# Patient Record
Sex: Male | Born: 1960 | Race: White | Hispanic: No | Smoking: Never smoker
Health system: Southern US, Community
[De-identification: ages and names within clinical notes are randomized; demographics above are authoritative.]

## PROBLEM LIST (undated history)

## (undated) DIAGNOSIS — M549 Dorsalgia, unspecified: Principal | ICD-10-CM

## (undated) DIAGNOSIS — K859 Acute pancreatitis without necrosis or infection, unspecified: Secondary | ICD-10-CM

## (undated) DIAGNOSIS — M1990 Unspecified osteoarthritis, unspecified site: Secondary | ICD-10-CM

## (undated) DIAGNOSIS — I2510 Atherosclerotic heart disease of native coronary artery without angina pectoris: Secondary | ICD-10-CM

## (undated) DIAGNOSIS — T7840XA Allergy, unspecified, initial encounter: Secondary | ICD-10-CM

## (undated) DIAGNOSIS — G931 Anoxic brain damage, not elsewhere classified: Secondary | ICD-10-CM

## (undated) DIAGNOSIS — F329 Major depressive disorder, single episode, unspecified: Secondary | ICD-10-CM

## (undated) DIAGNOSIS — E7800 Pure hypercholesterolemia, unspecified: Secondary | ICD-10-CM

## (undated) DIAGNOSIS — G8929 Other chronic pain: Secondary | ICD-10-CM

## (undated) DIAGNOSIS — I10 Essential (primary) hypertension: Secondary | ICD-10-CM

## (undated) DIAGNOSIS — T8853XA Unintended awareness under general anesthesia during procedure, initial encounter: Secondary | ICD-10-CM

## (undated) DIAGNOSIS — G3184 Mild cognitive impairment, so stated: Secondary | ICD-10-CM

## (undated) DIAGNOSIS — F32A Depression, unspecified: Secondary | ICD-10-CM

## (undated) DIAGNOSIS — Z8719 Personal history of other diseases of the digestive system: Secondary | ICD-10-CM

## (undated) HISTORY — PX: SPINE SURGERY: SHX786

## (undated) HISTORY — DX: Allergy, unspecified, initial encounter: T78.40XA

## (undated) HISTORY — PX: LUMBAR SPINE SURGERY: SHX701

## (undated) HISTORY — PX: TRIGGER POINT INJECTION: SHX2580

## (undated) HISTORY — DX: Major depressive disorder, single episode, unspecified: F32.9

## (undated) HISTORY — PX: BACK SURGERY: SHX140

## (undated) HISTORY — DX: Pure hypercholesterolemia, unspecified: E78.00

## (undated) HISTORY — DX: Other chronic pain: G89.29

## (undated) HISTORY — DX: Personal history of other diseases of the digestive system: Z87.19

## (undated) HISTORY — PX: VASECTOMY: SHX75

## (undated) HISTORY — PX: JOINT REPLACEMENT: SHX530

## (undated) HISTORY — DX: Dorsalgia, unspecified: M54.9

## (undated) HISTORY — PX: CARDIAC CATHETERIZATION: SHX172

## (undated) HISTORY — PX: SHOULDER ARTHROSCOPY WITH ROTATOR CUFF REPAIR AND OPEN BICEPS TENODESIS: SHX6677

## (undated) HISTORY — DX: Mild cognitive impairment of uncertain or unknown etiology: G31.84

## (undated) HISTORY — DX: Atherosclerotic heart disease of native coronary artery without angina pectoris: I25.10

## (undated) HISTORY — DX: Anoxic brain damage, not elsewhere classified: G93.1

## (undated) HISTORY — PX: LAMINECTOMY: SHX219

## (undated) HISTORY — PX: FUNCTIONAL ENDOSCOPIC SINUS SURGERY: SUR616

## (undated) HISTORY — DX: Acute pancreatitis without necrosis or infection, unspecified: K85.90

## (undated) HISTORY — PX: OTHER SURGICAL HISTORY: SHX169

## (undated) HISTORY — DX: Depression, unspecified: F32.A

---

## 2003-07-30 ENCOUNTER — Other Ambulatory Visit: Payer: Self-pay

## 2004-07-08 ENCOUNTER — Ambulatory Visit: Payer: Self-pay | Admitting: Physician Assistant

## 2005-09-30 ENCOUNTER — Emergency Department: Payer: Self-pay | Admitting: Emergency Medicine

## 2005-11-18 ENCOUNTER — Ambulatory Visit: Payer: Self-pay | Admitting: Psychiatry

## 2005-11-18 ENCOUNTER — Other Ambulatory Visit: Payer: Self-pay

## 2006-01-16 ENCOUNTER — Ambulatory Visit: Payer: Self-pay | Admitting: Physician Assistant

## 2006-02-09 ENCOUNTER — Ambulatory Visit: Payer: Self-pay | Admitting: Pain Medicine

## 2006-02-22 ENCOUNTER — Ambulatory Visit: Payer: Self-pay | Admitting: Pain Medicine

## 2006-02-28 ENCOUNTER — Ambulatory Visit: Payer: Self-pay | Admitting: Pain Medicine

## 2006-03-14 ENCOUNTER — Ambulatory Visit: Payer: Self-pay | Admitting: Pain Medicine

## 2006-03-28 ENCOUNTER — Ambulatory Visit: Payer: Self-pay | Admitting: Pain Medicine

## 2006-03-29 ENCOUNTER — Ambulatory Visit: Payer: Self-pay | Admitting: Internal Medicine

## 2006-04-19 ENCOUNTER — Ambulatory Visit: Payer: Self-pay | Admitting: Unknown Physician Specialty

## 2006-05-26 ENCOUNTER — Other Ambulatory Visit: Payer: Self-pay

## 2006-05-26 ENCOUNTER — Ambulatory Visit: Payer: Self-pay | Admitting: Unknown Physician Specialty

## 2006-06-05 ENCOUNTER — Inpatient Hospital Stay: Payer: Self-pay | Admitting: Unknown Physician Specialty

## 2006-11-30 ENCOUNTER — Ambulatory Visit: Payer: Self-pay | Admitting: Gastroenterology

## 2008-07-16 ENCOUNTER — Ambulatory Visit: Payer: Self-pay | Admitting: Unknown Physician Specialty

## 2008-07-22 ENCOUNTER — Inpatient Hospital Stay: Payer: Self-pay | Admitting: Internal Medicine

## 2008-07-31 ENCOUNTER — Ambulatory Visit: Payer: Self-pay | Admitting: Unknown Physician Specialty

## 2008-08-12 ENCOUNTER — Encounter: Payer: Self-pay | Admitting: Gastroenterology

## 2008-08-14 ENCOUNTER — Inpatient Hospital Stay: Payer: Self-pay | Admitting: Specialist

## 2008-08-15 ENCOUNTER — Telehealth (INDEPENDENT_AMBULATORY_CARE_PROVIDER_SITE_OTHER): Payer: Self-pay | Admitting: *Deleted

## 2008-08-15 ENCOUNTER — Encounter: Payer: Self-pay | Admitting: Gastroenterology

## 2008-08-18 ENCOUNTER — Telehealth (INDEPENDENT_AMBULATORY_CARE_PROVIDER_SITE_OTHER): Payer: Self-pay | Admitting: *Deleted

## 2008-09-04 ENCOUNTER — Telehealth (INDEPENDENT_AMBULATORY_CARE_PROVIDER_SITE_OTHER): Payer: Self-pay | Admitting: *Deleted

## 2009-10-19 ENCOUNTER — Ambulatory Visit: Payer: Self-pay | Admitting: Psychology

## 2011-02-01 HISTORY — PX: CHOLECYSTECTOMY: SHX55

## 2011-02-01 HISTORY — PX: VEIN SURGERY: SHX48

## 2011-07-22 DIAGNOSIS — M961 Postlaminectomy syndrome, not elsewhere classified: Secondary | ICD-10-CM | POA: Insufficient documentation

## 2011-07-29 DIAGNOSIS — F320 Major depressive disorder, single episode, mild: Secondary | ICD-10-CM | POA: Insufficient documentation

## 2011-07-29 DIAGNOSIS — F329 Major depressive disorder, single episode, unspecified: Secondary | ICD-10-CM | POA: Insufficient documentation

## 2011-07-29 DIAGNOSIS — F32A Depression, unspecified: Secondary | ICD-10-CM | POA: Insufficient documentation

## 2011-10-27 ENCOUNTER — Inpatient Hospital Stay: Payer: Self-pay | Admitting: Internal Medicine

## 2011-10-27 LAB — COMPREHENSIVE METABOLIC PANEL
Alkaline Phosphatase: 128 U/L (ref 50–136)
Bilirubin,Total: 0.5 mg/dL (ref 0.2–1.0)
Co2: 26 mmol/L (ref 21–32)
EGFR (Non-African Amer.): >60
Glucose: 81 mg/dL (ref 65–99)
SGOT(AST): 20 U/L (ref 15–37)
SGPT (ALT): 18 U/L (ref 12–78)
Sodium: 144 mmol/L (ref 136–145)

## 2011-10-27 LAB — DRUG SCREEN, URINE
Benzodiazepine, Ur Scrn: POSITIVE (ref ?–200)
Cannabinoid 50 Ng, Ur ~~LOC~~: NEGATIVE (ref ?–50)
Cocaine Metabolite,Ur ~~LOC~~: NEGATIVE (ref ?–300)
MDMA (Ecstasy)Ur Screen: NEGATIVE (ref ?–500)
Phencyclidine (PCP) Ur S: NEGATIVE (ref ?–25)
Tricyclic, Ur Screen: NEGATIVE (ref ?–1000)

## 2011-10-27 LAB — URINALYSIS, COMPLETE
Bilirubin,UR: NEGATIVE
Nitrite: NEGATIVE
Protein: NEGATIVE
RBC,UR: 1 /HPF (ref 0–5)
WBC UR: <1 /HPF (ref 0–5)

## 2011-10-27 LAB — PROTIME-INR
INR: 0.9
Prothrombin Time: 12.5 secs (ref 11.5–14.7)

## 2011-10-27 LAB — CBC
HGB: 14.5 g/dL (ref 13.0–18.0)
MCHC: 33.9 g/dL (ref 32.0–36.0)
RDW: 12.9 % (ref 11.5–14.5)
WBC: 8 10*3/uL (ref 3.8–10.6)

## 2011-10-27 LAB — APTT: Activated PTT: 30.3 secs (ref 23.6–35.9)

## 2011-10-27 LAB — TROPONIN I: Troponin-I: <0.02 ng/mL

## 2011-10-27 LAB — MAGNESIUM: Magnesium: 2 mg/dL

## 2011-10-28 LAB — LIPASE, BLOOD: Lipase: 139 U/L (ref 73–393)

## 2011-10-28 LAB — CBC WITH DIFFERENTIAL/PLATELET
Basophil #: 0 10*3/uL (ref 0.0–0.1)
Eosinophil #: 0.1 10*3/uL (ref 0.0–0.7)
HCT: 38.5 % — ABNORMAL LOW (ref 40.0–52.0)
HGB: 13.5 g/dL (ref 13.0–18.0)
Lymphocyte %: 31.9 %
MCHC: 35 g/dL (ref 32.0–36.0)
MCV: 92 fL (ref 80–100)
Monocyte #: 0.6 x10 3/mm (ref 0.2–1.0)
Monocyte %: 8.9 %
Neutrophil #: 4.1 10*3/uL (ref 1.4–6.5)
Neutrophil %: 56.8 %
Platelet: 230 10*3/uL (ref 150–440)
RDW: 13 % (ref 11.5–14.5)

## 2011-10-28 LAB — BASIC METABOLIC PANEL
Calcium, Total: 8.2 mg/dL — ABNORMAL LOW (ref 8.5–10.1)
Co2: 26 mmol/L (ref 21–32)
EGFR (African American): >60
EGFR (Non-African Amer.): >60
Glucose: 82 mg/dL (ref 65–99)
Osmolality: 281 (ref 275–301)
Potassium: 3.9 mmol/L (ref 3.5–5.1)

## 2011-10-28 LAB — MAGNESIUM: Magnesium: 1.8 mg/dL

## 2011-10-30 LAB — LIPID PANEL
Cholesterol: 139 mg/dL (ref 0–200)
VLDL Cholesterol, Calc: 21 mg/dL (ref 5–40)

## 2011-10-31 LAB — LIPASE, BLOOD: Lipase: 339 U/L (ref 73–393)

## 2011-11-03 LAB — COMPREHENSIVE METABOLIC PANEL
Albumin: 3.3 g/dL — ABNORMAL LOW (ref 3.4–5.0)
Alkaline Phosphatase: 96 U/L (ref 50–136)
Anion Gap: 7 (ref 7–16)
BUN: 5 mg/dL — ABNORMAL LOW (ref 7–18)
Calcium, Total: 8.4 mg/dL — ABNORMAL LOW (ref 8.5–10.1)
Creatinine: 1.11 mg/dL (ref 0.60–1.30)
Glucose: 148 mg/dL — ABNORMAL HIGH (ref 65–99)
Osmolality: 285 (ref 275–301)
Potassium: 3.9 mmol/L (ref 3.5–5.1)
Sodium: 143 mmol/L (ref 136–145)
Total Protein: 6.7 g/dL (ref 6.4–8.2)

## 2011-11-04 LAB — PATHOLOGY REPORT

## 2011-11-08 ENCOUNTER — Observation Stay: Payer: Self-pay | Admitting: Surgery

## 2011-11-08 LAB — COMPREHENSIVE METABOLIC PANEL
Anion Gap: 6 — ABNORMAL LOW (ref 7–16)
BUN: 12 mg/dL (ref 7–18)
Bilirubin,Total: 0.5 mg/dL (ref 0.2–1.0)
Calcium, Total: 9.2 mg/dL (ref 8.5–10.1)
Chloride: 106 mmol/L (ref 98–107)
Co2: 28 mmol/L (ref 21–32)
Creatinine: 1.04 mg/dL (ref 0.60–1.30)
EGFR (African American): >60
EGFR (Non-African Amer.): >60
Osmolality: 279 (ref 275–301)
Potassium: 4.4 mmol/L (ref 3.5–5.1)
SGPT (ALT): 56 U/L (ref 12–78)
Sodium: 140 mmol/L (ref 136–145)
Total Protein: 8.2 g/dL (ref 6.4–8.2)

## 2011-11-08 LAB — CBC WITH DIFFERENTIAL/PLATELET
Basophil #: 0 10*3/uL (ref 0.0–0.1)
Basophil %: 0.4 %
Eosinophil %: 0.2 %
HCT: 42.8 % (ref 40.0–52.0)
HGB: 15.1 g/dL (ref 13.0–18.0)
Lymphocyte %: 10.9 %
MCHC: 35.4 g/dL (ref 32.0–36.0)
Monocyte %: 4.9 %
Neutrophil #: 7.5 10*3/uL — ABNORMAL HIGH (ref 1.4–6.5)
Neutrophil %: 83.6 %
Platelet: 271 10*3/uL (ref 150–440)
RBC: 4.75 10*6/uL (ref 4.40–5.90)
WBC: 9 10*3/uL (ref 3.8–10.6)

## 2011-11-09 LAB — COMPREHENSIVE METABOLIC PANEL
Albumin: 3.2 g/dL — ABNORMAL LOW (ref 3.4–5.0)
Alkaline Phosphatase: 166 U/L — ABNORMAL HIGH (ref 50–136)
Anion Gap: 9 (ref 7–16)
BUN: 8 mg/dL (ref 7–18)
Bilirubin,Total: 0.7 mg/dL (ref 0.2–1.0)
Calcium, Total: 8.3 mg/dL — ABNORMAL LOW (ref 8.5–10.1)
Creatinine: 0.85 mg/dL (ref 0.60–1.30)
Glucose: 88 mg/dL (ref 65–99)
Potassium: 3.8 mmol/L (ref 3.5–5.1)
SGOT(AST): 47 U/L — ABNORMAL HIGH (ref 15–37)
SGPT (ALT): 45 U/L (ref 12–78)
Sodium: 142 mmol/L (ref 136–145)
Total Protein: 6.4 g/dL (ref 6.4–8.2)

## 2011-11-09 LAB — CBC WITH DIFFERENTIAL/PLATELET
Basophil #: 0.1 10*3/uL (ref 0.0–0.1)
Basophil %: 1.2 %
Eosinophil #: 0.3 10*3/uL (ref 0.0–0.7)
HCT: 36 % — ABNORMAL LOW (ref 40.0–52.0)
Lymphocyte %: 36.2 %
MCHC: 35.5 g/dL (ref 32.0–36.0)
MCV: 90 fL (ref 80–100)
Monocyte %: 8.4 %
Neutrophil #: 2.7 10*3/uL (ref 1.4–6.5)
Neutrophil %: 49.3 %
Platelet: 207 10*3/uL (ref 150–440)
RDW: 12.2 % (ref 11.5–14.5)
WBC: 5.5 10*3/uL (ref 3.8–10.6)

## 2011-11-10 LAB — COMPREHENSIVE METABOLIC PANEL
Albumin: 3.3 g/dL — ABNORMAL LOW (ref 3.4–5.0)
Alkaline Phosphatase: 156 U/L — ABNORMAL HIGH (ref 50–136)
Anion Gap: 8 (ref 7–16)
Bilirubin,Total: 0.4 mg/dL (ref 0.2–1.0)
Calcium, Total: 8.5 mg/dL (ref 8.5–10.1)
Co2: 27 mmol/L (ref 21–32)
EGFR (African American): >60
EGFR (Non-African Amer.): >60
Glucose: 99 mg/dL (ref 65–99)
Osmolality: 284 (ref 275–301)
SGOT(AST): 26 U/L (ref 15–37)
Sodium: 144 mmol/L (ref 136–145)

## 2011-11-10 LAB — CBC WITH DIFFERENTIAL/PLATELET
Basophil %: 1.4 %
Eosinophil #: 0.4 10*3/uL (ref 0.0–0.7)
HCT: 37.5 % — ABNORMAL LOW (ref 40.0–52.0)
HGB: 13.1 g/dL (ref 13.0–18.0)
Lymphocyte #: 2 10*3/uL (ref 1.0–3.6)
MCH: 31.7 pg (ref 26.0–34.0)
MCHC: 35 g/dL (ref 32.0–36.0)
MCV: 91 fL (ref 80–100)
Monocyte #: 0.5 x10 3/mm (ref 0.2–1.0)
Monocyte %: 8.2 %
Neutrophil #: 3.2 10*3/uL (ref 1.4–6.5)
RBC: 4.14 10*6/uL — ABNORMAL LOW (ref 4.40–5.90)
RDW: 12.9 % (ref 11.5–14.5)

## 2011-11-22 ENCOUNTER — Telehealth: Payer: Self-pay | Admitting: Internal Medicine

## 2011-11-22 NOTE — Telephone Encounter (Signed)
I can see him at 11:15 on 11/20/11.  Work in for this.  If he can bring records from hospitalization (would help).  Also have him bring a list of his current meds.  Thanks.

## 2011-11-22 NOTE — Telephone Encounter (Signed)
Spoke with pt and scheduled that appt.

## 2011-11-22 NOTE — Telephone Encounter (Signed)
Pt is needing to be seen as soon as possible. He was in hospital recently.  He had his Gallbladder removed 2 week ago. And he end back up in the hospital with same symptoms but they are not sure what is going on. He is having bad trouble with severe sweating.  Phone [phone]

## 2011-11-23 ENCOUNTER — Telehealth: Payer: Self-pay | Admitting: Internal Medicine

## 2011-11-23 NOTE — Telephone Encounter (Signed)
Pt is calling back he is very scared. He is blacking out, he gets confused, very dizzy, he is burning up but no fever. He isn't sure what to do or where he should go ??? He says that Doctor's looked for an infection when he was in the hospital but back when he was a [facility] he was brought back and it caused some brain damage. He has no appetite.

## 2011-11-25 NOTE — Telephone Encounter (Signed)
Tried patients cell phone again and number was still busy.

## 2011-11-25 NOTE — Telephone Encounter (Signed)
Tried calling again still busy.

## 2011-11-25 NOTE — Telephone Encounter (Signed)
Cell phone continues to remain busy.

## 2011-11-25 NOTE — Telephone Encounter (Signed)
If dizzy, confused and fever- rec eval now.  To er for eval.

## 2011-11-25 NOTE — Telephone Encounter (Signed)
Tried calling patient cell phone line busy and home line was wrong number

## 2011-11-29 NOTE — Telephone Encounter (Signed)
Patient has appointment tomorrow 11/30/2011, will advice then.

## 2011-11-30 ENCOUNTER — Encounter: Payer: Self-pay | Admitting: Internal Medicine

## 2011-11-30 ENCOUNTER — Ambulatory Visit (INDEPENDENT_AMBULATORY_CARE_PROVIDER_SITE_OTHER): Payer: Medicare Other | Admitting: Internal Medicine

## 2011-11-30 VITALS — BP 142/72 | HR 77 | Temp 98.0°F | Ht 72.0 in | Wt 213.0 lb

## 2011-11-30 DIAGNOSIS — M549 Dorsalgia, unspecified: Secondary | ICD-10-CM

## 2011-11-30 DIAGNOSIS — G8929 Other chronic pain: Secondary | ICD-10-CM

## 2011-11-30 DIAGNOSIS — F3289 Other specified depressive episodes: Secondary | ICD-10-CM

## 2011-11-30 DIAGNOSIS — F32A Depression, unspecified: Secondary | ICD-10-CM

## 2011-11-30 DIAGNOSIS — E7800 Pure hypercholesterolemia, unspecified: Secondary | ICD-10-CM

## 2011-11-30 DIAGNOSIS — F329 Major depressive disorder, single episode, unspecified: Secondary | ICD-10-CM

## 2011-12-01 ENCOUNTER — Encounter: Payer: Self-pay | Admitting: Internal Medicine

## 2011-12-01 DIAGNOSIS — E7800 Pure hypercholesterolemia, unspecified: Secondary | ICD-10-CM | POA: Insufficient documentation

## 2011-12-01 DIAGNOSIS — G8929 Other chronic pain: Secondary | ICD-10-CM | POA: Insufficient documentation

## 2011-12-01 NOTE — Patient Instructions (Signed)
Will make a follow up appt with Dr Evelene Croon.  Also will obtain records for review before drawing more blood.

## 2011-12-01 NOTE — Assessment & Plan Note (Signed)
Has had five back surgeries.  Previously followed by Dr Gerrit Heck.  Is currently being followed at [facility] pain clinic and being referred to Dr Timoteo Ace (neurosurgery).  Not on any pain meds currently.  Will need to continue to follow up at pain clinic regarding his back and chronic pain management.  Is aware that I do not prescribe chronic pain meds.

## 2011-12-01 NOTE — Progress Notes (Signed)
Subjective:    Patient ID: [name], male    DOB: [birth year], 51 y.o.   MRN: [id number]  HPI 51 year old male with past history of chronic back pain s/p five back surgeries and pancreatitis who comes in today for a scheduled follow up.  He was just recently hospitalized for pancreatitis and is now s/p cholecystectomy.  WAs readmitted one week after his surgery with with increased  Bloating and "burning up".  No antibiotics given in the hospital.  He followed up with Dr Michela Pitcher after the last hospitalization.  Dr Michela Pitcher felt everything from a surgical standpoint was doing well.  He continues to have a hot feeling.  Some night sweating.  He is having increased post nasal drainage and congestion.  No chest congestion or sob.  No nausea or vomiting.  Eating better.  Gaining weight.  He is out of his pain meds.  He is being evaluated at the Pain Clinic at [facility].  Is now being referred to Dr Timoteo Ace [phone] - neurosurgery.  He also reports increased stress.  He and his wife separated - 5/13.  He found out she had affairs.  He has sole custody of the kids.  Increased stress and some depression.  Stays in bed a lot.  Not seeing Dr Evelene Croon currently.  No suicidal ideations.    Past Medical History  Diagnosis Date  . Hypercholesterolemia   . Pancreatitis   . Chronic back pain     s/p 5 back surgeries     Review of Systems Patient denies any headache, lightheadedness or dizziness.  No chest pain, tightness or palpatations. No increased shortness of breath, cough or congestion.  No nausea or vomiting.  No signficant abdominal pain or cramping.  No bowel change, such as diarrhea, constipation, BRBPR or melana.  No urine change.        Objective:   Physical Exam Filed Vitals:   11/30/11 1205  BP: 142/72  Pulse: 77  Temp: 98 F (36.7 C)   Blood pressure recheck:  36/48  51 year old male in no acute distress.   HEENT:  Nares - clear except slightly erythematous turbinates.  TMs without erythema.   OP- without lesions or erythema.  Minimal tenderness to palpation over the sinuses.   NECK:  Supple, nontender.  No audible bruit.   HEART:  Appears to be regular. LUNGS:  Without crackles or wheezing audible.  Respirations even and unlabored.   RADIAL PULSE:  Equal bilaterally.  ABDOMEN:  Soft.  No significant tenderness.  Bowel sounds present and normal.  No audible abdominal bruit.   EXTREMITIES:  No increased edema to be present.              SKIN:  No rash.        Assessment & Plan:  INCREASED PSYCHOSOCIAL STRESSORS.  Discussed at length with him today.  Spent over 25 minutes with him - with more than 50% of time discussing his current living situation, etc.  No suicidal ideations.  Discussed the need to get back in with Dr Evelene Croon.  Will arrange an appt.  He is comfortable with this plan.  Hold on adding medication at this time.  He is currently getting his meds through pain clinic.    DIFFICULTY SLEEPING.  Currently receiving his meds through pain clinic.  Will refer back to Dr Evelene Croon.   NIGHT SWEATS.  States he feels hot - intermittent day and night.  Eating better.  Abdominal pain  better.  No urinary symptoms.  Some allergy and sinus symptoms.  Will treat with astelin nasal spray.  Flush with saline.  Gave him a Rx for a Zpak.  If symptoms progress, he can get this filled.  Can see if treatment for this helps with the "hot feeling".  Will also review recent lab results to see if further testing warranted.

## 2011-12-01 NOTE — Assessment & Plan Note (Signed)
Low cholesterol diet and exercise.  Follow lipid panel.   

## 2011-12-08 ENCOUNTER — Telehealth: Payer: Self-pay | Admitting: Internal Medicine

## 2011-12-08 DIAGNOSIS — E7800 Pure hypercholesterolemia, unspecified: Secondary | ICD-10-CM

## 2011-12-08 DIAGNOSIS — R61 Generalized hyperhidrosis: Secondary | ICD-10-CM

## 2011-12-08 NOTE — Telephone Encounter (Signed)
Pt is saying that his mucous and coughing has not getting any better. He says he does not have a fever but is sweating extremely bad. He is not sure what to do or which way to go with what is going on.

## 2011-12-08 NOTE — Telephone Encounter (Signed)
If persistent drainage - then would rec ENT referral.  Regarding the sweatig - i would like to check a few labs.  Have him come in for labs within the next week.  I will place order.

## 2011-12-09 NOTE — Telephone Encounter (Signed)
Left message for patient to return call.

## 2011-12-09 NOTE — Telephone Encounter (Signed)
Patient returned call and has scheduled an appointment to see Dr. Lorin Picket next week.

## 2011-12-13 ENCOUNTER — Ambulatory Visit (INDEPENDENT_AMBULATORY_CARE_PROVIDER_SITE_OTHER): Payer: Medicare Other | Admitting: Internal Medicine

## 2011-12-13 ENCOUNTER — Telehealth: Payer: Self-pay | Admitting: Internal Medicine

## 2011-12-13 ENCOUNTER — Encounter: Payer: Self-pay | Admitting: Internal Medicine

## 2011-12-13 VITALS — BP 123/83 | HR 71 | Temp 98.2°F | Ht 72.0 in | Wt 215.0 lb

## 2011-12-13 DIAGNOSIS — G8929 Other chronic pain: Secondary | ICD-10-CM

## 2011-12-13 DIAGNOSIS — R109 Unspecified abdominal pain: Secondary | ICD-10-CM

## 2011-12-13 DIAGNOSIS — E7800 Pure hypercholesterolemia, unspecified: Secondary | ICD-10-CM

## 2011-12-13 DIAGNOSIS — R61 Generalized hyperhidrosis: Secondary | ICD-10-CM

## 2011-12-13 DIAGNOSIS — M549 Dorsalgia, unspecified: Secondary | ICD-10-CM

## 2011-12-13 LAB — LIPID PANEL
HDL: 40.1 mg/dL (ref >39.00)
Total CHOL/HDL Ratio: 4
Triglycerides: 67 mg/dL (ref 0.0–149.0)

## 2011-12-13 LAB — COMPREHENSIVE METABOLIC PANEL
ALT: 23 U/L (ref 0–53)
Albumin: 4.2 g/dL (ref 3.5–5.2)
BUN: 14 mg/dL (ref 6–23)
CO2: 24 mEq/L (ref 19–32)
Calcium: 8.8 mg/dL (ref 8.4–10.5)
Chloride: 103 mEq/L (ref 96–112)
Creatinine, Ser: 1 mg/dL (ref 0.4–1.5)
GFR: 86.29 mL/min (ref >60.00)
Potassium: 4.2 mEq/L (ref 3.5–5.1)

## 2011-12-13 LAB — TSH: TSH: 1.09 u[IU]/mL (ref 0.35–5.50)

## 2011-12-13 NOTE — Telephone Encounter (Signed)
Pt says he received a call from [facility] and they are saying that his white blood count is running high and they advised him to go to the ER to make sure it was not an infection in his stomach. He was wondering what he should do because the labs have not come back in from today.

## 2011-12-13 NOTE — Patient Instructions (Addendum)
I am sorry you are still having issues with increased abdominal pain.  Keep your follow up with Dr Myriam Jacobson.  I am going to check a few labs today and we will notify you of your results once they are available.

## 2011-12-13 NOTE — Telephone Encounter (Signed)
Pt is going to [facility] to have a cbc drawn (repeat cbc).  He states the message was from this previous weekend.  He is feeling better and declines er eval at this time.  Instructed to go to ER if any acute change or problem

## 2011-12-14 ENCOUNTER — Telehealth: Payer: Self-pay | Admitting: *Deleted

## 2011-12-14 ENCOUNTER — Other Ambulatory Visit: Payer: Self-pay | Admitting: Internal Medicine

## 2011-12-14 LAB — CBC WITH DIFFERENTIAL/PLATELET
Basophil #: 0.1 10*3/uL (ref 0.0–0.1)
Eosinophil %: 2.6 %
HGB: 14.3 g/dL (ref 13.0–18.0)
Lymphocyte #: 1.8 10*3/uL (ref 1.0–3.6)
MCH: 30.9 pg (ref 26.0–34.0)
MCHC: 34.1 g/dL (ref 32.0–36.0)
MCV: 91 fL (ref 80–100)
Monocyte #: 0.4 x10 3/mm (ref 0.2–1.0)
Monocyte %: 5.9 %
Neutrophil #: 4.8 10*3/uL (ref 1.4–6.5)
RBC: 4.64 10*6/uL (ref 4.40–5.90)
RDW: 13 % (ref 11.5–14.5)
WBC: 7.3 10*3/uL (ref 3.8–10.6)

## 2011-12-14 NOTE — Telephone Encounter (Signed)
Patient called to check on his labs. Dr. Lorin Picket reviewed CBC results from [facility], the CBC came back normal. Called patient back and gave him the results.

## 2011-12-14 NOTE — Progress Notes (Signed)
Called and gave lab results to patient.  

## 2011-12-22 ENCOUNTER — Encounter: Payer: Self-pay | Admitting: Internal Medicine

## 2011-12-25 ENCOUNTER — Encounter: Payer: Self-pay | Admitting: Internal Medicine

## 2011-12-25 NOTE — Assessment & Plan Note (Signed)
Low cholesterol diet and exercise.  Follow.   

## 2011-12-25 NOTE — Progress Notes (Signed)
  Subjective:    Patient ID: Veatrice Kells, male    DOB: [birth year], 51 y.o.   MRN: [id number]  HPI 51 year old male with past history of chronic back and abdominal pain, anoxic brain injury with resultant cognitive impairment who comes in today as a work in to discuss his abdominal pain.  He actually called in for persistent congestion, but when he was here - he discussed his abdominal pain.  He is seeing GI at [facility] (Dr Myriam Jacobson) [phone].  He is back on his morphine.  States this has helped his abdominal pain.  He is eating and has gained weight.  Still with the chronic back pain.  Planning to see neurosurgery.  Does not report increased congestion.  States he felt better after taking the Zpak.  Still some drainage, but overall improved.    Past Medical History  Diagnosis Date  . Hypercholesterolemia   . Pancreatitis   . Chronic back pain     s/p 5 back surgeries  . Mild cognitive impairment with memory loss   . Anoxic brain injury     Review of Systems Patient denies any headache, lightheadedness or dizziness.  Some drainage. No chest pain, tightness or palpitations.  No increased shortness of breath, cough or congestion.  Still describes the abdominal pain.  Just saw GI.  They are reviewing records.  Eating and drinking.  Gaining weight.   No bowel change, such as diarrhea, constipation, BRBPR or melana.  No urine change.        Objective:   Physical Exam Filed Vitals:   12/13/11 0800  BP: 123/83  Pulse: 71  Temp: 98.2 F (29.64 C)   51 year old male  in no acute distress.   HEENT:  Nares - clear.  OP- without lesions or erythema.  NECK:  Supple, nontender.  No audible bruit.   HEART:  Appears to be regular. LUNGS:  Without crackles or wheezing audible.  Respirations even and unlabored.   RADIAL PULSE:  Equal bilaterally.  ABDOMEN:  Soft.  No significant tenderness to palpation.  No rebound or guarding present.  Minimal discomfort.   No audible abdominal bruit.     EXTREMITIES:  No increased edema to be present.                     Assessment & Plan:  CHRONIC ABDOMINAL PAIN.  Is s/p recent cholecystectomy.  Back on his morphine now and he states this has helped.  Will hold on scanning.  He has had multiple scans.  He is seeing GI - Dr Myriam Jacobson.  Will check cbc, met b, liver panel, amylase and lipase to confirm no change.  Follow closely.  He knows if he has any acute change or problem, he is to be reevaluated.  I spent more than 25 minutes with the patient and more than 50% of the time was spent in consultation.   CONGESTION.  Better.  Flonase and saline as instructed.  Follow.

## 2011-12-25 NOTE — Assessment & Plan Note (Signed)
Followed at pain clinic.  Gets all his pain meds there.  Is planning to see neurosurgery.

## 2011-12-26 ENCOUNTER — Encounter: Payer: Self-pay | Admitting: Internal Medicine

## 2012-02-22 ENCOUNTER — Telehealth: Payer: Self-pay | Admitting: Internal Medicine

## 2012-02-22 NOTE — Telephone Encounter (Signed)
Per Dr. Lorin Picket pt needs CPE in April left message for him to call back and Schedule. When scheduled please write date and time of paper Medical records (top shelf wooden cabinet in Amber's office). And give back to Dr. Lorin Picket

## 2012-02-23 ENCOUNTER — Telehealth: Payer: Self-pay | Admitting: Internal Medicine

## 2012-02-23 NOTE — Telephone Encounter (Signed)
I reviewed the scan.  There was mention of a small area on the kidney - probable cyst.  Not necessarily uncommon to see.  What we can do is order a renal ultrasound to follow up.  If he is agreeable, let me know and I will place order.

## 2012-02-23 NOTE — Telephone Encounter (Signed)
Pt made cpx for 4/3  And he has questions about mri /ultrasound at [place]..  Pt stated they found cysst on kidney   Pt stated he had [facility] to send you reports  Please advise what pt should do

## 2012-02-24 ENCOUNTER — Other Ambulatory Visit: Payer: Self-pay | Admitting: Internal Medicine

## 2012-02-24 DIAGNOSIS — N281 Cyst of kidney, acquired: Secondary | ICD-10-CM

## 2012-02-24 NOTE — Progress Notes (Signed)
Ordered renal ultrasound.

## 2012-02-24 NOTE — Telephone Encounter (Signed)
Patient is agreeable to renal ultrasound.

## 2012-02-28 ENCOUNTER — Ambulatory Visit: Payer: Self-pay | Admitting: Internal Medicine

## 2012-03-02 NOTE — Telephone Encounter (Signed)
Patient wanted to know what you thought about his spleen being enlarged.

## 2012-03-02 NOTE — Telephone Encounter (Signed)
noitfy pt renal US - simple cyst.  If bp elevated - may be related to fall and pain.  Will need to monitor and see if improves.  If persistent elevation - needs eval.

## 2012-03-02 NOTE — Telephone Encounter (Signed)
Pt called back checking on his results of the ultra sound he had done  Monday [facility] Pt stated he was at [facility] acute today.  He fell 2 days ago  Pt stated that the dr at [facility] stated he had fractured rib near heart His bp 138/118

## 2012-03-04 NOTE — Telephone Encounter (Signed)
With the mention of enlarged spleen, would recommend referral back to GI for question of need for any further w/up.  May just be something that is followed.  Let me know which gastroenterologist he wants me to refer him to.   How are BPs running now.

## 2012-03-06 NOTE — Telephone Encounter (Signed)
Noted.  Can hold until get name of gastroenterologist.

## 2012-03-06 NOTE — Telephone Encounter (Signed)
BP's have been running around 130/88. Patient stated that he is going to call back with name and number of gastroenterologist. Sent request to [facility] urgent care to have records sent.

## 2012-03-07 NOTE — Telephone Encounter (Signed)
Has appointment with Mrs. Trude Mcburney on 03/15/12 at 8:30. Patient wants a copy of his last renal ultrasound.

## 2012-03-08 ENCOUNTER — Telehealth: Payer: Self-pay | Admitting: Internal Medicine

## 2012-03-08 NOTE — Telephone Encounter (Signed)
Pt came by to get copy of renal ultra sound and wanted to know how many ribs were fractured

## 2012-03-09 ENCOUNTER — Telehealth: Payer: Self-pay | Admitting: Internal Medicine

## 2012-03-09 ENCOUNTER — Emergency Department: Payer: Self-pay | Admitting: Emergency Medicine

## 2012-03-09 LAB — BASIC METABOLIC PANEL
Co2: 29 mmol/L (ref 21–32)
Creatinine: 1.09 mg/dL (ref 0.60–1.30)
EGFR (African American): >60
EGFR (Non-African Amer.): >60
Glucose: 61 mg/dL — ABNORMAL LOW (ref 65–99)
Potassium: 3.8 mmol/L (ref 3.5–5.1)
Sodium: 140 mmol/L (ref 136–145)

## 2012-03-09 LAB — CBC
HCT: 46.8 % (ref 40.0–52.0)
HGB: 16.2 g/dL (ref 13.0–18.0)
MCH: 31.4 pg (ref 26.0–34.0)
MCHC: 34.7 g/dL (ref 32.0–36.0)
MCV: 91 fL (ref 80–100)
Platelet: 274 10*3/uL (ref 150–440)
RBC: 5.17 10*6/uL (ref 4.40–5.90)
RDW: 12.9 % (ref 11.5–14.5)
WBC: 10 10*3/uL (ref 3.8–10.6)

## 2012-03-09 NOTE — Telephone Encounter (Signed)
Dr. Lorin Picket I looked around but didn't find the labs. I saw them the other day when they came in don you still have them? Thanks

## 2012-03-09 NOTE — Telephone Encounter (Signed)
Pt called checking on results of xray and michelle was going to get ultra sound of spleen kidney so he can take [facility] Due is waiting for all the results

## 2012-03-12 NOTE — Telephone Encounter (Signed)
I am not sure that I understand this message.  The renal ultrasound was performed to evaluate a renal cyst.  He was notified that this appeared to be a simple cyst.  He was notified of an incidental finding of an enlarged spleen.  He was referred to GI for evaluation.  The ultrasound does not look at ribs.  He may have been referring to an acute care visit for pain.  I do not see report here about ribs.  He can have a copy of the renal ultrasound.  I placed a copy of the ultrasound on your desk.

## 2012-03-12 NOTE — Telephone Encounter (Signed)
Called acute care to have latest records sent over.

## 2012-03-13 NOTE — Telephone Encounter (Signed)
Patient called let us know that he went to ER for eval Friday 2/7. Called to have report faxed over.

## 2012-03-14 NOTE — Telephone Encounter (Signed)
Received faxed and given to Dr. Lorin Picket.

## 2012-03-26 ENCOUNTER — Telehealth: Payer: Self-pay | Admitting: Internal Medicine

## 2012-03-26 NOTE — Telephone Encounter (Signed)
Pt broke 3 ribs and after that he started getting really bad chest congestion and wanted to come see you today. I offered teh 4 pm but he has ot pick his kids up from school and couldn't he was wanting to know if a zpak could be called in ?? I told him I would ask but he would probably have to come see you.

## 2012-03-26 NOTE — Telephone Encounter (Signed)
scheduled

## 2012-03-26 NOTE — Telephone Encounter (Signed)
I spoke to raquel.  She agreed to see him tomorrow.

## 2012-03-27 ENCOUNTER — Ambulatory Visit (INDEPENDENT_AMBULATORY_CARE_PROVIDER_SITE_OTHER): Payer: Medicare Other | Admitting: Adult Health

## 2012-03-27 ENCOUNTER — Encounter: Payer: Self-pay | Admitting: Adult Health

## 2012-03-27 VITALS — BP 138/83 | HR 80 | Temp 98.6°F | Resp 14 | Wt 218.0 lb

## 2012-03-27 DIAGNOSIS — J329 Chronic sinusitis, unspecified: Secondary | ICD-10-CM

## 2012-03-27 DIAGNOSIS — R221 Localized swelling, mass and lump, neck: Secondary | ICD-10-CM | POA: Insufficient documentation

## 2012-03-27 MED ORDER — AZITHROMYCIN 250 MG PO TABS: ORAL_TABLET | ORAL | Status: DC

## 2012-03-27 MED ORDER — FLUTICASONE PROPIONATE 50 MCG/ACT NA SUSP: 2.0000 | Freq: Every day | NASAL | Status: DC

## 2012-03-27 NOTE — Patient Instructions (Addendum)
  Please start your antibiotic today. You will take 2 tablets today then take 1 tablet daily for the next 4 days.  Start the flonase nasal spray. You will do 2 sprays in each nostril daily.  Try sudafed PE. This can be purchased over the counter.

## 2012-03-27 NOTE — Progress Notes (Signed)
  Subjective:    Patient ID: [name], male    DOB: 1960-07-15, 52 y.o.   MRN: [id number]  HPI  Patient is a pleasant 52 year old male who presents to clinic today with ongoing sinus symptoms x3 weeks. He reports that he has yellow drainage, dry cough, allergy symptoms which are irritating. He has tried several over-the-counter antihistamines but he reports that he develops a tolerance to these and needs to rotate them.  Current Outpatient Prescriptions on File Prior to Visit  Medication Sig Dispense Refill  . cetirizine (ZYRTEC) 10 MG chewable tablet Chew 10 mg by mouth daily.      . traZODone (DESYREL) 100 MG tablet Take 100 mg by mouth at bedtime.      . diazepam (VALIUM) 10 MG tablet Take 10 mg by mouth at bedtime as needed.      Marland Kitchen morphine (MSIR) 30 MG tablet Take 30 mg by mouth every 4 (four) hours as needed.      . ondansetron (ZOFRAN) 8 MG tablet Take by mouth every 12 (twelve) hours as needed.      . Tamsulosin HCl (FLOMAX) 0.4 MG CAPS Take by mouth daily.      Marland Kitchen zolpidem (AMBIEN) 10 MG tablet Take 10 mg by mouth at bedtime as needed.       No current facility-administered medications on file prior to visit.      Review of Systems  HENT: Positive for congestion, sore throat, rhinorrhea, postnasal drip and sinus pressure.   Eyes: Positive for redness and itching.  Respiratory: Positive for cough.        Objective:   Physical Exam  Constitutional: He is oriented to person, place, and time. He appears well-developed and well-nourished. No distress.  HENT:  Head: Normocephalic and atraumatic.  Cardiovascular: Normal rate, regular rhythm and normal heart sounds.   Pulmonary/Chest: Effort normal and breath sounds normal.  Neurological: He is alert and oriented to person, place, and time.  Skin: Skin is warm and dry.  Psychiatric: He has a normal mood and affect. His behavior is normal. Judgment and thought content normal.          Assessment & Plan:

## 2012-03-27 NOTE — Assessment & Plan Note (Signed)
Symptoms suggestive of bacterial infection. Will start azithromycin. Will also start Flonase nasal spray 2 sprays in each nostril daily. Recommended Sudafed PE for his rhinitis.

## 2012-04-04 ENCOUNTER — Encounter: Payer: Self-pay | Admitting: Internal Medicine

## 2012-05-01 ENCOUNTER — Encounter: Payer: Self-pay | Admitting: Internal Medicine

## 2012-05-03 ENCOUNTER — Ambulatory Visit (INDEPENDENT_AMBULATORY_CARE_PROVIDER_SITE_OTHER): Payer: Medicare Other | Admitting: Internal Medicine

## 2012-05-03 ENCOUNTER — Encounter: Payer: Self-pay | Admitting: Internal Medicine

## 2012-05-03 ENCOUNTER — Ambulatory Visit: Payer: Self-pay | Admitting: Internal Medicine

## 2012-05-03 VITALS — BP 108/70 | HR 93 | Temp 97.9°F | Ht 72.0 in | Wt 212.5 lb

## 2012-05-03 DIAGNOSIS — G8929 Other chronic pain: Secondary | ICD-10-CM

## 2012-05-03 DIAGNOSIS — M549 Dorsalgia, unspecified: Secondary | ICD-10-CM

## 2012-05-03 DIAGNOSIS — D649 Anemia, unspecified: Secondary | ICD-10-CM

## 2012-05-03 DIAGNOSIS — R413 Other amnesia: Secondary | ICD-10-CM

## 2012-05-03 DIAGNOSIS — R51 Headache: Secondary | ICD-10-CM

## 2012-05-03 DIAGNOSIS — E7800 Pure hypercholesterolemia, unspecified: Secondary | ICD-10-CM

## 2012-05-03 DIAGNOSIS — Z125 Encounter for screening for malignant neoplasm of prostate: Secondary | ICD-10-CM

## 2012-05-04 ENCOUNTER — Telehealth: Payer: Self-pay | Admitting: *Deleted

## 2012-05-04 NOTE — Telephone Encounter (Signed)
Called patient with his ct results.

## 2012-05-06 ENCOUNTER — Encounter: Payer: Self-pay | Admitting: Internal Medicine

## 2012-05-06 DIAGNOSIS — R413 Other amnesia: Secondary | ICD-10-CM | POA: Insufficient documentation

## 2012-05-06 DIAGNOSIS — D649 Anemia, unspecified: Secondary | ICD-10-CM | POA: Insufficient documentation

## 2012-05-06 NOTE — Assessment & Plan Note (Signed)
Followed at pain clinic.  Gets all his pain meds there.    

## 2012-05-06 NOTE — Progress Notes (Signed)
Subjective:    Patient ID: [name], male    DOB: [birth year], 52 y.o.   MRN: [id number]  HPI 52 year old male with past history of chronic back and abdominal pain, anoxic brain injury with resultant cognitive impairment who comes in today to follow up on these issues as well as for a complete physical exam.  He is seeing Dr Guido Sander in GI ([facility]).  Was just evaluated 04/27/12.  They were unable to do any evaluation that day, because he was dehydrated and had fallen and hit his head.  They sent him to the ER for evaluation - with plans to follow up on his GI issues at a later date.  See there note for details.  States he had the stomach flu.  Blacked out.  Went to the ER.  Labs and EKG ok.  Had a cut on his head.  This has healed.  States he has had some residual headache and dizziness, but overall feels he is doing better.  Still with some abdominal discomfort.  They have him on neurontin now.  He states this helps.  Off narcotic pain meds.  Still seeing Dr Channing Mutters - [facility] pain med.  Bowels stable.  Handling stress relatively well.  Taking care of his three kids.  He plans to follow up with Dr Evelene Croon at the end of this month.     Past Medical History  Diagnosis Date  . Hypercholesterolemia   . Pancreatitis   . Chronic back pain     s/p 5 back surgeries  . Mild cognitive impairment with memory loss   . Anoxic brain injury   . Depression   . Allergy      Current Outpatient Prescriptions on File Prior to Visit  Medication Sig Dispense Refill  . cetirizine (ZYRTEC) 10 MG chewable tablet Chew 10 mg by mouth daily.      . fluticasone (FLONASE) 50 MCG/ACT nasal spray Place 2 sprays into the nose daily.  16 g  6  . ondansetron (ZOFRAN) 8 MG tablet Take by mouth every 12 (twelve) hours as needed.      . traZODone (DESYREL) 100 MG tablet Take 100 mg by mouth at bedtime.       No current facility-administered medications on file prior to visit.    Review of Systems Patient does report some headache  and minimal light headedness.  Is s/p fall with head injury.  Laceration has healed.   No chest pain, tightness or palpitations.  No increased shortness of breath, cough or congestion.  Still describes the abdominal pain.  Just saw GI.  They are reviewing records.  Eating and drinking.  Gaining weight.   No bowel change, such as diarrhea, constipation, BRBPR or melana.  No urine change.  Feels he is handling stress relatively well.  Planning to see his psychiatrist (Dr Evelene Croon) at the end of the month.      Objective:   Physical Exam  Filed Vitals:   05/03/12 1321  BP: 108/70  Pulse: 93  Temp: 97.9 F (36.6 C)   Blood pressure recheck:  122/84, pulse 3  52 year old male in no acute distress.  HEENT:  Nares - clear.  Oropharynx - without lesions. Minimal tenderness over the left eye.   NECK:  Supple.  Nontender.  No audible carotid bruit.  HEART:  Appears to be regular.   LUNGS:  No crackles or wheezing audible.  Respirations even and unlabored.   RADIAL PULSE:  Equal  bilaterally.  ABDOMEN:  Soft.  Nontender.  Bowel sounds present and normal.  No audible abdominal bruit.  GU:  Normal descended testicles.  No palpable testicular nodules.   RECTAL:  Could not appreciate any palpable prostate nodules.  Heme negative.   EXTREMITIES:  No increased edema present.  DP pulses palpable and equal bilaterally.         Assessment & Plan:  CHRONIC ABDOMINAL PAIN.  Is s/p recent cholecystectomy.  He has had multiple scans.  He is seeing GI - Dr Biagio Borg.  See his note for details.  Follow.  On neurontin and this appears to be helping.   CONGESTION.  Better.  Flonase and saline as instructed.  Follow.   PSYCH.  Sees Dr Evelene Croon.    CARDIOVASCULAR.  Asymptomatic.    HEALTH MAINTENANCE.  Physical today.  Has had colonoscopy.  Colonoscopy 12/12 - hyperplastic polyp.  Check psa today.

## 2012-05-06 NOTE — Assessment & Plan Note (Signed)
Low cholesterol diet and exercise.  Follow.   

## 2012-05-06 NOTE — Assessment & Plan Note (Signed)
Last hgb normal.  Follow.   

## 2012-05-06 NOTE — Assessment & Plan Note (Signed)
Being followed at [facility] neurology.  Appears to be better off increased pain meds.  Follow.   

## 2012-05-08 ENCOUNTER — Encounter: Payer: Self-pay | Admitting: Internal Medicine

## 2012-05-17 ENCOUNTER — Encounter: Payer: Self-pay | Admitting: Internal Medicine

## 2012-05-27 ENCOUNTER — Emergency Department: Payer: Self-pay | Admitting: Emergency Medicine

## 2012-07-10 ENCOUNTER — Emergency Department: Payer: Self-pay | Admitting: Internal Medicine

## 2012-08-08 ENCOUNTER — Ambulatory Visit: Payer: Self-pay | Admitting: Orthopedic Surgery

## 2012-09-05 ENCOUNTER — Telehealth: Payer: Self-pay | Admitting: *Deleted

## 2012-09-05 ENCOUNTER — Ambulatory Visit: Payer: Self-pay | Admitting: Internal Medicine

## 2012-09-05 NOTE — Telephone Encounter (Signed)
Pt wants to know if you would be willing to fill out his long term disability papers? He states that you have done them for him in the past. Please advise

## 2012-09-05 NOTE — Telephone Encounter (Signed)
I don't do disability papers.  There are physicians that will complete (specifically for disability).

## 2012-09-05 NOTE — Telephone Encounter (Signed)
Left message on voicemail to notify patient.

## 2012-09-10 ENCOUNTER — Telehealth: Payer: Self-pay | Admitting: Internal Medicine

## 2012-09-10 NOTE — Telephone Encounter (Signed)
Pt asking for lab work to be done before appt on 8/15 - asking for CA19-9, lipase, and any other lab work you feel is necessary. Pt has been scheduled for labs with [place] Tuesday 8/12 at 11:45.  Pt states he can go to hospital if he needs stat results, especially for CA19-9.  Pt states while at hospital diabetes was questionable due to blood sugars.  Asking also for Zofran, pt is completely out.  Pt asking for call on cell to advise if labs should be done here or at hospital and any other advice.

## 2012-09-10 NOTE — Telephone Encounter (Signed)
His last labs we did looked fine.  I do not have any labs scheduled for him.  It may be a better idea to see him and then order the necessary labs (if he is agreeable to this).  Is he having acute issues now.  Regarding the zofran, I have not prescribed this for him in the past.  Does he use this intermittently or is he having acute issues now.  If acute abdominal pain and nausea, etc - needs evaluation today (acute care).  Thanks.

## 2012-09-10 NOTE — Telephone Encounter (Signed)
Pt notified to go to acute care after he reported that he is currently having acute sx's. Pt asked for next avail here. I scheduled pt for tomorrow with Raquel at 1:30. Pt also wanted to keep appt on Fri 8/15 with Dr. Lorin Picket. Pt also instructed to call back and cancel appt with Raquel if he goes to Acute Care,

## 2012-09-11 ENCOUNTER — Ambulatory Visit: Payer: Self-pay | Admitting: Adult Health

## 2012-09-11 ENCOUNTER — Other Ambulatory Visit: Payer: Self-pay

## 2012-09-14 ENCOUNTER — Ambulatory Visit (INDEPENDENT_AMBULATORY_CARE_PROVIDER_SITE_OTHER): Payer: Medicare Other | Admitting: Internal Medicine

## 2012-09-14 ENCOUNTER — Ambulatory Visit (INDEPENDENT_AMBULATORY_CARE_PROVIDER_SITE_OTHER)
Admission: RE | Admit: 2012-09-14 | Discharge: 2012-09-14 | Disposition: A | Discharge status: Home / Self Care | Payer: Medicare Other | Source: Ambulatory Visit | Attending: Internal Medicine | Admitting: Internal Medicine

## 2012-09-14 ENCOUNTER — Encounter: Payer: Self-pay | Admitting: Internal Medicine

## 2012-09-14 VITALS — BP 120/80 | HR 103 | Temp 98.0°F | Ht 72.0 in | Wt 203.2 lb

## 2012-09-14 DIAGNOSIS — G8929 Other chronic pain: Secondary | ICD-10-CM

## 2012-09-14 DIAGNOSIS — E7800 Pure hypercholesterolemia, unspecified: Secondary | ICD-10-CM

## 2012-09-14 DIAGNOSIS — R918 Other nonspecific abnormal finding of lung field: Secondary | ICD-10-CM

## 2012-09-14 DIAGNOSIS — R413 Other amnesia: Secondary | ICD-10-CM

## 2012-09-14 DIAGNOSIS — D649 Anemia, unspecified: Secondary | ICD-10-CM

## 2012-09-14 DIAGNOSIS — R739 Hyperglycemia, unspecified: Secondary | ICD-10-CM

## 2012-09-14 DIAGNOSIS — R9389 Abnormal findings on diagnostic imaging of other specified body structures: Secondary | ICD-10-CM

## 2012-09-14 DIAGNOSIS — M549 Dorsalgia, unspecified: Secondary | ICD-10-CM

## 2012-09-14 DIAGNOSIS — R7309 Other abnormal glucose: Secondary | ICD-10-CM

## 2012-09-14 DIAGNOSIS — K859 Acute pancreatitis without necrosis or infection, unspecified: Secondary | ICD-10-CM

## 2012-09-14 LAB — CBC WITH DIFFERENTIAL/PLATELET
Basophils Absolute: 0 10*3/uL (ref 0.0–0.1)
Hemoglobin: 15.5 g/dL (ref 13.0–17.0)
Lymphocytes Relative: 27.3 % (ref 12.0–46.0)
Monocytes Relative: 8.6 % (ref 3.0–12.0)
Platelets: 285 10*3/uL (ref 150.0–400.0)
RDW: 12.8 % (ref 11.5–14.6)
WBC: 8.2 10*3/uL (ref 4.5–10.5)

## 2012-09-14 LAB — HEPATIC FUNCTION PANEL
ALT: 62 U/L — ABNORMAL HIGH (ref 0–53)
AST: 35 U/L (ref 0–37)
Alkaline Phosphatase: 132 U/L — ABNORMAL HIGH (ref 39–117)
Bilirubin, Direct: 0.1 mg/dL (ref 0.0–0.3)
Total Protein: 7.6 g/dL (ref 6.0–8.3)

## 2012-09-14 LAB — BASIC METABOLIC PANEL
CO2: 25 mEq/L (ref 19–32)
Chloride: 100 mEq/L (ref 96–112)
Potassium: 4.2 mEq/L (ref 3.5–5.1)
Sodium: 133 mEq/L — ABNORMAL LOW (ref 135–145)

## 2012-09-15 LAB — CANCER ANTIGEN 19-9: CA 19-9: 25.7 U/mL (ref <35.0)

## 2012-09-16 ENCOUNTER — Encounter: Payer: Self-pay | Admitting: Internal Medicine

## 2012-09-16 NOTE — Progress Notes (Signed)
Subjective:    Patient ID: [name], male    DOB: 23-Mar-1960, 52 y.o.   MRN: [id number]  HPI 52 year old male with past history of chronic back and abdominal pain, anoxic brain injury with resultant cognitive impairment who comes in today for a hospital follow up.  He was admitted after a back injection.  Found to have an elevated heart rate.  Transferred over to the ER.  Labs revealed an elevated lipase.  Cr and LFTs elevated.  He was hydrated and given pain meds.  Since his discharge, his pain has been under reasonable control on his current regimen.   He was seeing Dr Guido Sander in GI ([facility]).  Was evaluated 04/27/12.  They were unable to do any evaluation that day, because he was dehydrated and had fallen and hit his head.  They sent him to the ER for evaluation - with plans to follow up on his GI issues at a later date.  See there note for details.  We discussed the need for GI follow up.  He has a call into them regarding follow up.   Had questions regarding his lab and xrays. Also, he is planning to follow up with cardiology with holter monitor to be placed today.  Still seeing Dr Channing Mutters - [facility].  Was having increased back pain.  Was off his narcotic meds for a while.  Increased stress.  Taking care of his three kids.  He plans to follow up with Dr Evelene Croon at the end of this month.     Past Medical History  Diagnosis Date  . Hypercholesterolemia   . Pancreatitis   . Chronic back pain     s/p 5 back surgeries  . Mild cognitive impairment with memory loss   . Anoxic brain injury   . Depression   . Allergy     Current Outpatient Prescriptions on File Prior to Visit  Medication Sig Dispense Refill  . cetirizine (ZYRTEC) 10 MG chewable tablet Chew 10 mg by mouth as needed.       . fluticasone (FLONASE) 50 MCG/ACT nasal spray Place 2 sprays into the nose daily.  16 g  6  . gabapentin (NEURONTIN) 800 MG tablet Take 800 mg by mouth daily.      . traZODone (DESYREL) 100 MG tablet Take 100  mg by mouth at bedtime as needed.       . ondansetron (ZOFRAN) 8 MG tablet Take by mouth every 12 (twelve) hours as needed.       No current facility-administered medications on file prior to visit.    Review of Systems Patient does report some headache and minimal light headedness.  Is s/p fall with head injury.  Laceration has healed.   No chest pain, tightness or palpitations.  No increased shortness of breath, cough or congestion.  Still describes the abdominal pain.  Just saw GI.  They are reviewing records.  Eating and drinking.  Gaining weight.   No bowel change, such as diarrhea, constipation, BRBPR or melana.  No urine change.  Feels he is handling stress relatively well.  Planning to see his psychiatrist (Dr Evelene Croon) at the end of the month.      Objective:   Physical Exam  Filed Vitals:   09/14/12 0818  BP: 120/80  Pulse: 103  Temp: 98 F (36.7 C)   Blood pressure recheck:  118/78, pulse 57  52 year old male in no acute distress.  HEENT:  Nares -  clear.  Oropharynx - without lesions.   NECK:  Supple.  Nontender.  No audible carotid bruit.  HEART:  Appears to be regular.   LUNGS:  No crackles or wheezing audible.  Respirations even and unlabored.   RADIAL PULSE:  Equal bilaterally.  ABDOMEN:  Soft.  Minimal tenderness to palpation.  Bowel sounds present and normal.  No audible abdominal bruit.   EXTREMITIES:  No increased edema present.  DP pulses palpable and equal bilaterally.         Assessment & Plan:  CHRONIC ABDOMINAL PAIN.  Recently admitted and found to have elevated lipase, renal function and liver function.  Pain is better now on his current regimen.  Is s/p recent cholecystectomy.  He has had multiple scans.  He has been seeing GI - Dr Biagio Borg.  Needs follow up with GI.  Recheck liver panel, metabolic panel, amylase/lipase and CA 19-9 (at pts request).  Eating and drinking now.   ABNORMAL CXR.  CXR in the hospital revealed the ascending aorta to appear slightly  larger and tortuous.  Was on and AP cxr.  Also had some minimal right basilar atelectasis.  Recommended a follow up PA and Lateral CXR.  Check today.   PSYCH.  Sees Dr Evelene Croon.    CARDIOVASCULAR. With increased heart rate recently.  ECHO in the hospital revealed normal left ventricular systolic function, trivial PR, no stenosis.   Planning to follow up with cardiology today.  Planning to have holter monitor placed today.     HEALTH MAINTENANCE.  Physical last visit.  Has had colonoscopy.  Colonoscopy 12/12 - hyperplastic polyp.  PSA 05/03/12 - .78.    I spent 40 minutes with the patient and more than 50% of the time was spent in consultation regarding the above.

## 2012-09-16 NOTE — Assessment & Plan Note (Signed)
Followed at pain clinic.  Gets all his pain meds there.    

## 2012-09-16 NOTE — Assessment & Plan Note (Signed)
Low cholesterol diet and exercise.  Follow.   

## 2012-09-16 NOTE — Assessment & Plan Note (Signed)
Being followed at [facility] neurology.  Appears to be better off increased pain meds.  Follow.   

## 2012-09-16 NOTE — Assessment & Plan Note (Signed)
Last hgb normal.  Follow.   

## 2012-09-16 NOTE — Assessment & Plan Note (Signed)
Treatment and work up in hospital as outlined.  Doing better.  Check follow up labs as outlined.  Needs follow up with GI.

## 2012-09-18 ENCOUNTER — Other Ambulatory Visit: Payer: Self-pay | Admitting: Internal Medicine

## 2012-09-18 DIAGNOSIS — R945 Abnormal results of liver function studies: Secondary | ICD-10-CM

## 2012-09-18 DIAGNOSIS — E871 Hypo-osmolality and hyponatremia: Secondary | ICD-10-CM

## 2012-09-18 DIAGNOSIS — R7989 Other specified abnormal findings of blood chemistry: Secondary | ICD-10-CM

## 2012-09-18 NOTE — Progress Notes (Signed)
Orders placed for follow up labs 

## 2012-09-19 ENCOUNTER — Encounter: Payer: Self-pay | Admitting: *Deleted

## 2012-10-30 ENCOUNTER — Ambulatory Visit (INDEPENDENT_AMBULATORY_CARE_PROVIDER_SITE_OTHER): Payer: Medicare Other | Admitting: Adult Health

## 2012-10-30 ENCOUNTER — Encounter: Payer: Self-pay | Admitting: Adult Health

## 2012-10-30 VITALS — BP 110/76 | HR 98 | Temp 98.2°F | Resp 12 | Wt 207.0 lb

## 2012-10-30 DIAGNOSIS — J209 Acute bronchitis, unspecified: Secondary | ICD-10-CM

## 2012-10-30 DIAGNOSIS — J029 Acute pharyngitis, unspecified: Secondary | ICD-10-CM

## 2012-10-30 LAB — POCT RAPID STREP A (OFFICE): Rapid Strep A Screen: NEGATIVE

## 2012-10-30 MED ORDER — AZITHROMYCIN 250 MG PO TABS: ORAL_TABLET | ORAL | Status: DC

## 2012-10-30 NOTE — Patient Instructions (Addendum)
  Start the Azithromycin as directed.  Chloraseptic spray or lozenges for sore throat.  Drink plenty of fluids.  Honey may soothe your throat.  Follow up on Friday with Dr. Lorin Picket.

## 2012-10-30 NOTE — Progress Notes (Signed)
  Subjective:    Patient ID: [name], male    DOB: [birth year], 52 y.o.   MRN: [id number]  HPI  Patient is a pleasant 52 y/o male who presents to clinic with sore throat x 6 days. Worse in the morning. He has some post nasal drip. Now having some coughing and chest congestion. He reports fever last week as well as chills. Currently no fever.   Current Outpatient Prescriptions on File Prior to Visit  Medication Sig Dispense Refill  . amphetamine-dextroamphetamine (ADDERALL) 30 MG tablet Take 30 mg by mouth daily.      . cetirizine (ZYRTEC) 10 MG chewable tablet Chew 10 mg by mouth as needed.       . diazepam (VALIUM) 5 MG tablet Take 5 mg by mouth every 8 (eight) hours as needed (pain).      . fluticasone (FLONASE) 50 MCG/ACT nasal spray Place 2 sprays into the nose daily.  16 g  6  . gabapentin (NEURONTIN) 800 MG tablet Take 800 mg by mouth daily.      Marland Kitchen HYDROcodone-acetaminophen (NORCO/VICODIN) 5-325 MG per tablet Take 1-2 tablets by mouth every 8 (eight) hours as needed for pain.      Marland Kitchen loratadine (CLARITIN) 10 MG tablet Take 10 mg by mouth daily.      . ondansetron (ZOFRAN) 8 MG tablet Take by mouth every 12 (twelve) hours as needed.      . OxyCODONE HCl, Abuse Deter, 5 MG TABA Take 1-2 tablets by mouth every 4 (four) hours as needed (back pain or pancreatitis).      . traZODone (DESYREL) 100 MG tablet Take 100 mg by mouth at bedtime as needed.       . zolpidem (AMBIEN) 10 MG tablet Take 10 mg by mouth at bedtime as needed for sleep.       No current facility-administered medications on file prior to visit.     Review of Systems  Constitutional: Positive for fever and chills.  HENT: Positive for congestion, sore throat and postnasal drip.   Respiratory: Positive for cough and chest tightness. Negative for shortness of breath and wheezing.   Cardiovascular: Negative.        Objective:   Physical Exam  Constitutional: He appears well-developed and well-nourished. No  distress.  Cardiovascular: Normal rate, regular rhythm and normal heart sounds.  Exam reveals no gallop.   No murmur heard. Pulmonary/Chest: Effort normal. No respiratory distress. He has no wheezes.  Scattered rhonchi anterior chest bilaterally.  Lymphadenopathy:    He has no cervical adenopathy.          Assessment & Plan:

## 2012-10-30 NOTE — Assessment & Plan Note (Signed)
Start Azithromycin. Push fluids. Chloraseptic spray or lozenges for sore throat. Honey may soothe throat. OTC cough medication as needed. RTC on Friday for recheck.

## 2012-11-02 ENCOUNTER — Ambulatory Visit (INDEPENDENT_AMBULATORY_CARE_PROVIDER_SITE_OTHER): Payer: Medicare Other | Admitting: Internal Medicine

## 2012-11-02 ENCOUNTER — Encounter: Payer: Self-pay | Admitting: Internal Medicine

## 2012-11-02 VITALS — BP 130/80 | HR 115 | Temp 98.1°F | Ht 72.0 in | Wt 204.5 lb

## 2012-11-02 DIAGNOSIS — E871 Hypo-osmolality and hyponatremia: Secondary | ICD-10-CM

## 2012-11-02 DIAGNOSIS — G8929 Other chronic pain: Secondary | ICD-10-CM

## 2012-11-02 DIAGNOSIS — D649 Anemia, unspecified: Secondary | ICD-10-CM

## 2012-11-02 DIAGNOSIS — M549 Dorsalgia, unspecified: Secondary | ICD-10-CM

## 2012-11-02 DIAGNOSIS — R413 Other amnesia: Secondary | ICD-10-CM

## 2012-11-02 DIAGNOSIS — E7800 Pure hypercholesterolemia, unspecified: Secondary | ICD-10-CM

## 2012-11-02 DIAGNOSIS — R945 Abnormal results of liver function studies: Secondary | ICD-10-CM

## 2012-11-02 DIAGNOSIS — R7989 Other specified abnormal findings of blood chemistry: Secondary | ICD-10-CM

## 2012-11-02 DIAGNOSIS — J329 Chronic sinusitis, unspecified: Secondary | ICD-10-CM

## 2012-11-02 LAB — HEPATIC FUNCTION PANEL
ALT: 41 U/L (ref 0–53)
AST: 30 U/L (ref 0–37)
Albumin: 4.6 g/dL (ref 3.5–5.2)
Alkaline Phosphatase: 104 U/L (ref 39–117)
Bilirubin, Direct: 0 mg/dL (ref 0.0–0.3)
Total Bilirubin: 1 mg/dL (ref 0.3–1.2)
Total Protein: 8.1 g/dL (ref 6.0–8.3)

## 2012-11-04 ENCOUNTER — Encounter: Payer: Self-pay | Admitting: Internal Medicine

## 2012-11-04 DIAGNOSIS — E871 Hypo-osmolality and hyponatremia: Secondary | ICD-10-CM | POA: Insufficient documentation

## 2012-11-04 NOTE — Assessment & Plan Note (Signed)
Low cholesterol diet and exercise.  Follow.   

## 2012-11-04 NOTE — Assessment & Plan Note (Signed)
Symptoms improved.  Finishing zpak.  Use saline.  Robitussin if needed.  Follow.

## 2012-11-04 NOTE — Progress Notes (Signed)
Subjective:    Patient ID: [name], male    DOB: Sep 17, 1960, 52 y.o.   MRN: [id number]  HPI 52 year old male with past history of chronic back and abdominal pain, anoxic brain injury with resultant cognitive impairment who comes in today for a scheduled follow up.   He was admitted after a back injection.  Found to have an elevated heart rate.  Transferred over to the ER.  Labs revealed an elevated lipase.  Cr and LFTs elevated.  He was hydrated and given pain meds.  He was seeing Dr Guido Sander in GI ([facility]).  He is doing better now.  No abdominal pain or cramping.  Last labs here revealed a normal lipase and amylase.  Liver panel improved.  He saw cardiology and had holter placed.  Felt everything checked out fine and holter unrevealing.  From a cardiac standpoint - stable.  Asymptomatic.   Still seeing Dr Channing Mutters - [facility].   Pain currently under control.  Increased stress.  Taking care of his three kids.  Overall he feels he is doing relatively well.  Still seeing Dr Evelene Croon.      Past Medical History  Diagnosis Date  . Hypercholesterolemia   . Pancreatitis   . Chronic back pain     s/p 5 back surgeries  . Mild cognitive impairment with memory loss   . Anoxic brain injury   . Depression   . Allergy     Current Outpatient Prescriptions on File Prior to Visit  Medication Sig Dispense Refill  . amphetamine-dextroamphetamine (ADDERALL) 30 MG tablet Take 30 mg by mouth daily.      Marland Kitchen azithromycin (ZITHROMAX) 250 MG tablet Take 2 tablets today and then 1 tablet daily for the next 4 days.  6 tablet  0  . diazepam (VALIUM) 5 MG tablet Take 5 mg by mouth every 8 (eight) hours as needed (pain).      . fluticasone (FLONASE) 50 MCG/ACT nasal spray Place 2 sprays into the nose daily.  16 g  6  . gabapentin (NEURONTIN) 800 MG tablet Take 800 mg by mouth daily.      Marland Kitchen HYDROcodone-acetaminophen (NORCO/VICODIN) 5-325 MG per tablet Take 1-2 tablets by mouth every 8 (eight) hours as needed for pain.       Marland Kitchen ondansetron (ZOFRAN) 8 MG tablet Take by mouth every 12 (twelve) hours as needed.      . OxyCODONE HCl, Abuse Deter, 5 MG TABA Take 1-2 tablets by mouth every 4 (four) hours as needed (back pain or pancreatitis).      . traZODone (DESYREL) 100 MG tablet Take 100 mg by mouth at bedtime as needed.       . zolpidem (AMBIEN) 10 MG tablet Take 10 mg by mouth at bedtime as needed for sleep.       No current facility-administered medications on file prior to visit.    Review of Systems No headache or light headedness.  No dizziness.  No chest pain, tightness or palpitations.  No increased shortness of breath, cough or congestion.  Abdominal pain improved.  Eating.  No nausea or vomiting.   No bowel change, such as diarrhea, constipation, BRBPR or melana.  No urine change.  Feels he is handling stress relatively well.  Still following with his psychiatrist (Dr Evelene Croon).       Objective:   Physical Exam  Filed Vitals:   11/02/12 1322  BP: 130/80  Pulse: 115  Temp: 98.1 F (36.7 C)  Blood pressure recheck:  116/80, pulse 61  52 year old male in no acute distress.  HEENT:  Nares - clear.  Oropharynx - without lesions.   NECK:  Supple.  Nontender.  No audible carotid bruit.  HEART:  Appears to be regular.   LUNGS:  No crackles or wheezing audible.  Respirations even and unlabored.   RADIAL PULSE:  Equal bilaterally.  ABDOMEN:  Soft.  Minimal tenderness to palpation.  Bowel sounds present and normal.  No audible abdominal bruit.   EXTREMITIES:  No increased edema present.  DP pulses palpable and equal bilaterally.         Assessment & Plan:  CHRONIC ABDOMINAL PAIN.  Recently admitted and found to have elevated lipase, renal function and liver function.   Is s/p recent cholecystectomy.  He has had multiple scans.  He has been seeing GI - Dr Biagio Borg.   Eating and drinking now.  Currently asymptomatic.  Recheck liver panel.   ABNORMAL CXR.  CXR in the hospital revealed the ascending aorta to  appear slightly larger and tortuous.  Was on and AP cxr.  Also had some minimal right basilar atelectasis.  F/u CXR after last visit - clear.    PSYCH.  Sees Dr Evelene Croon.    CARDIOVASCULAR.  ECHO in the hospital revealed normal left ventricular systolic function, trivial PR, no stenosis.  Holter monitor unrevealing.  Saw cardiology.  Currently asymptomatic.      HEALTH MAINTENANCE.  Physical 05/03/12.   Has had colonoscopy.  Colonoscopy 12/12 - hyperplastic polyp.  PSA 05/03/12 - .78.

## 2012-11-04 NOTE — Assessment & Plan Note (Signed)
Last hgb normal.  Follow.   

## 2012-11-04 NOTE — Assessment & Plan Note (Signed)
Being followed at [facility] neurology.  Appears to be better off increased pain meds.  Follow.   

## 2012-11-04 NOTE — Assessment & Plan Note (Signed)
Recheck sodium today.  

## 2012-11-04 NOTE — Assessment & Plan Note (Signed)
Followed at pain clinic.  Gets all his pain meds there.    

## 2012-11-05 ENCOUNTER — Encounter: Payer: Self-pay | Admitting: *Deleted

## 2012-11-23 ENCOUNTER — Inpatient Hospital Stay: Payer: Self-pay | Admitting: Family Medicine

## 2012-11-23 LAB — CK TOTAL AND CKMB (NOT AT ARMC)
CK, Total: 406 U/L — ABNORMAL HIGH (ref 35–232)
CK-MB: 3.3 ng/mL (ref 0.5–3.6)
CK-MB: 3.6 ng/mL (ref 0.5–3.6)
CK-MB: 3.3 ng/mL (ref 0.5–3.6)

## 2012-11-23 LAB — CBC
HCT: 41.7 % (ref 40.0–52.0)
HGB: 14.9 g/dL (ref 13.0–18.0)
Platelet: 268 10*3/uL (ref 150–440)
RDW: 13 % (ref 11.5–14.5)

## 2012-11-23 LAB — COMPREHENSIVE METABOLIC PANEL
Alkaline Phosphatase: 124 U/L (ref 50–136)
Anion Gap: 7 (ref 7–16)
Co2: 25 mmol/L (ref 21–32)
Glucose: 86 mg/dL (ref 65–99)
Osmolality: 278 (ref 275–301)
Potassium: 3.6 mmol/L (ref 3.5–5.1)
SGOT(AST): 165 U/L — ABNORMAL HIGH (ref 15–37)
Sodium: 137 mmol/L (ref 136–145)

## 2012-11-23 LAB — LIPASE, BLOOD: Lipase: >10000 U/L — ABNORMAL HIGH (ref 73–393)

## 2012-11-23 LAB — TROPONIN I
Troponin-I: <0.02 ng/mL
Troponin-I: <0.02 ng/mL

## 2012-11-24 LAB — BASIC METABOLIC PANEL
Anion Gap: 4 — ABNORMAL LOW (ref 7–16)
Calcium, Total: 8.2 mg/dL — ABNORMAL LOW (ref 8.5–10.1)
Chloride: 107 mmol/L (ref 98–107)
Co2: 26 mmol/L (ref 21–32)
EGFR (Non-African Amer.): >60
Glucose: 104 mg/dL — ABNORMAL HIGH (ref 65–99)
Osmolality: 272 (ref 275–301)
Potassium: 3.9 mmol/L (ref 3.5–5.1)
Sodium: 137 mmol/L (ref 136–145)

## 2012-11-24 LAB — CBC WITH DIFFERENTIAL/PLATELET
Basophil %: 0.6 %
Eosinophil %: 4 %
HCT: 39.8 % — ABNORMAL LOW (ref 40.0–52.0)
HGB: 14.1 g/dL (ref 13.0–18.0)
Lymphocyte #: 1.6 10*3/uL (ref 1.0–3.6)
Lymphocyte %: 26 %
MCH: 32.3 pg (ref 26.0–34.0)
MCHC: 35.5 g/dL (ref 32.0–36.0)
Neutrophil %: 61.5 %
Platelet: 236 10*3/uL (ref 150–440)
RBC: 4.37 10*6/uL — ABNORMAL LOW (ref 4.40–5.90)
RDW: 13 % (ref 11.5–14.5)

## 2012-11-24 LAB — LIPASE, BLOOD: Lipase: 3572 U/L — ABNORMAL HIGH (ref 73–393)

## 2012-11-27 ENCOUNTER — Telehealth: Payer: Self-pay | Admitting: Internal Medicine

## 2012-11-27 NOTE — Telephone Encounter (Signed)
LMTCB

## 2012-11-27 NOTE — Telephone Encounter (Signed)
Hospital follow up on 11.7.14

## 2012-12-05 NOTE — Telephone Encounter (Signed)
What are his current issues?  The specialists he has been seeing are at [facility].  He may need to follow up with them first and then see me.  (depends on the issues).  Thanks. I don't know why he was recently hospitalized.

## 2012-12-05 NOTE — Telephone Encounter (Signed)
Noted.  Hold for records.  Thanks.

## 2012-12-05 NOTE — Telephone Encounter (Signed)
Pt was seen in ER and admitted x 4 days for chronic pancreatitis. He passed out & hit the floor at home at Paramedics were called. His Labs: Lipase was 10,000 & dropped to 7,350 before discharged. He goes to [facility] next week & would like to keep appt here on Friday. ER records requested

## 2012-12-05 NOTE — Telephone Encounter (Signed)
Pt is calling and needing a call back on what's been going on with him being the hospital and needing to come in to see Dr. Lorin Picket and he is supposed to see someone at [facility] on the same day he is scheduled to come in to see Dr. Lorin Picket and he's not sure what to do at this point.Dr. Lorin Picket doesn't have anything again until 11.19.14.

## 2012-12-05 NOTE — Telephone Encounter (Signed)
Please advise 

## 2012-12-07 ENCOUNTER — Encounter: Payer: Self-pay | Admitting: Internal Medicine

## 2012-12-07 ENCOUNTER — Ambulatory Visit (INDEPENDENT_AMBULATORY_CARE_PROVIDER_SITE_OTHER): Payer: Medicare Other | Admitting: Internal Medicine

## 2012-12-07 VITALS — BP 130/80 | HR 115 | Temp 97.9°F | Ht 72.0 in | Wt 212.5 lb

## 2012-12-07 DIAGNOSIS — D649 Anemia, unspecified: Secondary | ICD-10-CM

## 2012-12-07 DIAGNOSIS — K859 Acute pancreatitis without necrosis or infection, unspecified: Secondary | ICD-10-CM

## 2012-12-07 DIAGNOSIS — M549 Dorsalgia, unspecified: Secondary | ICD-10-CM

## 2012-12-07 DIAGNOSIS — G8929 Other chronic pain: Secondary | ICD-10-CM

## 2012-12-07 DIAGNOSIS — E871 Hypo-osmolality and hyponatremia: Secondary | ICD-10-CM

## 2012-12-07 LAB — BASIC METABOLIC PANEL
BUN: 14 mg/dL (ref 6–23)
Chloride: 101 mEq/L (ref 96–112)
Creatinine, Ser: 1 mg/dL (ref 0.4–1.5)

## 2012-12-07 LAB — CBC WITH DIFFERENTIAL/PLATELET
Basophils Absolute: 0.1 10*3/uL (ref 0.0–0.1)
Eosinophils Absolute: 0.2 10*3/uL (ref 0.0–0.7)
Eosinophils Relative: 2.4 % (ref 0.0–5.0)
MCHC: 34.5 g/dL (ref 30.0–36.0)
MCV: 92 fl (ref 78.0–100.0)
Monocytes Absolute: 0.5 10*3/uL (ref 0.1–1.0)
Neutrophils Relative %: 66.4 % (ref 43.0–77.0)
Platelets: 340 10*3/uL (ref 150.0–400.0)
RDW: 13 % (ref 11.5–14.6)
WBC: 9.6 10*3/uL (ref 4.5–10.5)

## 2012-12-07 LAB — HEPATIC FUNCTION PANEL
Bilirubin, Direct: 0.1 mg/dL (ref 0.0–0.3)
Total Bilirubin: 0.6 mg/dL (ref 0.3–1.2)

## 2012-12-07 LAB — LIPASE: Lipase: 37 U/L (ref 11.0–59.0)

## 2012-12-07 MED ORDER — ONDANSETRON HCL 8 MG PO TABS: 8.0000 mg | ORAL_TABLET | Freq: Two times a day (BID) | ORAL | Status: DC | PRN

## 2012-12-07 NOTE — Progress Notes (Signed)
Pre-visit discussion using our clinic review tool. No additional management support is needed unless otherwise documented below in the visit note.  

## 2012-12-09 ENCOUNTER — Encounter: Payer: Self-pay | Admitting: Internal Medicine

## 2012-12-09 NOTE — Assessment & Plan Note (Signed)
Recheck sodium today.  

## 2012-12-09 NOTE — Assessment & Plan Note (Signed)
Followed at pain clinic.  Gets all his pain meds there.    

## 2012-12-09 NOTE — Assessment & Plan Note (Signed)
Last hgb normal.  Follow.   

## 2012-12-09 NOTE — Assessment & Plan Note (Signed)
Treatment and work up in hospital as outlined.  Doing better.  Check follow up labs as outlined.  Keep f/u with GI.  Any change in symptoms or worsening problems - eval.

## 2012-12-09 NOTE — Progress Notes (Signed)
Subjective:    Patient ID: Veatrice Kells, male    DOB: [birth year], 52 y.o.   MRN: [id number]  HPI 52 year old male with past history of chronic back and abdominal pain, anoxic brain injury with resultant cognitive impairment who comes in today for a hospital follow up.  He was admitted 11/23/12 with increased epigastric/abdominal pain.  Diagnosed with pancreatitis.  Was discharged on 10/26.  Pain is improved.  Still present, but improved. Has an appt to see GI ([facility]) on Monday 12/10/12.  He is eating.  No nausea or vomiting.   From a cardiac standpoint - stable.  Asymptomatic.   Still seeing Dr Channing Mutters - [facility].   Increased stress.  Taking care of his three kids.  Overall he feels he is doing relatively well handling the stress.   Still seeing Dr Evelene Croon.  States he is having some diarrhea every morning.  Taking zofran.       Past Medical History  Diagnosis Date  . Hypercholesterolemia   . Pancreatitis   . Chronic back pain     s/p 5 back surgeries  . Mild cognitive impairment with memory loss   . Anoxic brain injury   . Depression   . Allergy     Current Outpatient Prescriptions on File Prior to Visit  Medication Sig Dispense Refill  . amphetamine-dextroamphetamine (ADDERALL) 30 MG tablet Take 30 mg by mouth daily.      . diazepam (VALIUM) 5 MG tablet Take 5 mg by mouth every 8 (eight) hours as needed (pain).      . fluticasone (FLONASE) 50 MCG/ACT nasal spray Place 2 sprays into the nose daily.  16 g  6  . HYDROcodone-acetaminophen (NORCO/VICODIN) 5-325 MG per tablet Take 1-2 tablets by mouth every 8 (eight) hours as needed for pain.      . OxyCODONE HCl, Abuse Deter, 5 MG TABA Take 1-2 tablets by mouth every 4 (four) hours as needed (back pain or pancreatitis).      . phenylephrine (SUDAFED PE) 10 MG TABS tablet Take 10 mg by mouth every 4 (four) hours as needed.      . traZODone (DESYREL) 100 MG tablet Take 100 mg by mouth at bedtime as needed.       . zolpidem (AMBIEN) 10 MG  tablet Take 10 mg by mouth at bedtime as needed for sleep.       No current facility-administered medications on file prior to visit.    Review of Systems No headache or light headedness.  No dizziness.  No chest pain, tightness or palpitations.  No increased shortness of breath, cough or congestion.  Abdominal pain improved.  Eating.  No nausea or vomiting.   No bowel change, such as constipation, BRBPR or melana.  Does report some diarrhea in the am.   Is eating.  No urine change.  Feels he is handling stress relatively well.  Still following with his psychiatrist (Dr Evelene Croon).       Objective:   Physical Exam  Filed Vitals:   12/07/12 0948  BP: 130/80  Pulse: 115  Temp: 97.9 F (36.6 C)   Blood pressure recheck:  122/76,  pulse 73  52 year old male in no acute distress.  HEENT:  Nares - clear.  Oropharynx - without lesions.   NECK:  Supple.  Nontender.  No audible carotid bruit.  HEART:  Appears to be regular.   LUNGS:  No crackles or wheezing audible.  Respirations even and unlabored.  RADIAL PULSE:  Equal bilaterally.  ABDOMEN:  Soft.  Minimal tenderness to palpation epigastric region.  No rebound or guarding.   Bowel sounds present and normal.  No audible abdominal bruit.   EXTREMITIES:  No increased edema present.  DP pulses palpable and equal bilaterally.         Assessment & Plan:  CHRONIC ABDOMINAL PAIN.  Recently admitted with pancreatitis.   Is s/p recent cholecystectomy.  He has had multiple scans.  He has been seeing GI - Dr Biagio Borg.   Eating and drinking now. Due to follow up with [facility].  Recheck liver panel, amylase and lipase.    ABNORMAL CXR.  CXR in the hospital revealed the ascending aorta to appear slightly larger and tortuous.  Was on and AP cxr.  Also had some minimal right basilar atelectasis.  F/u CXR after last visit - clear.    PSYCH.  Sees Dr Evelene Croon.    CARDIOVASCULAR.  ECHO in the hospital revealed normal left ventricular systolic function, trivial PR,  no stenosis.  Holter monitor unrevealing.  Saw cardiology.  Currently asymptomatic.      HEALTH MAINTENANCE.  Physical 05/03/12.   Has had colonoscopy.  Colonoscopy 12/12 - hyperplastic polyp.  PSA 05/03/12 - .78.

## 2013-02-03 ENCOUNTER — Other Ambulatory Visit: Payer: Self-pay | Admitting: Adult Health

## 2013-02-05 ENCOUNTER — Encounter (INDEPENDENT_AMBULATORY_CARE_PROVIDER_SITE_OTHER): Payer: Self-pay

## 2013-02-05 ENCOUNTER — Ambulatory Visit (INDEPENDENT_AMBULATORY_CARE_PROVIDER_SITE_OTHER): Payer: Medicare Other | Admitting: Adult Health

## 2013-02-05 ENCOUNTER — Encounter: Payer: Self-pay | Admitting: Adult Health

## 2013-02-05 VITALS — BP 126/72 | HR 88 | Temp 97.8°F | Resp 12 | Ht 72.0 in | Wt 219.0 lb

## 2013-02-05 DIAGNOSIS — L723 Sebaceous cyst: Secondary | ICD-10-CM

## 2013-02-05 DIAGNOSIS — J329 Chronic sinusitis, unspecified: Secondary | ICD-10-CM

## 2013-02-05 MED ORDER — AZITHROMYCIN 250 MG PO TABS: ORAL_TABLET | ORAL | Status: DC

## 2013-02-05 NOTE — Progress Notes (Signed)
   Subjective:    Patient ID: [name], male    DOB: 12/21/1960, 53 y.o.   MRN: [id number]  HPI  Pt presents with sinus drainage that is yellow, coughing. Ongoing x 3 weeks.  Current Outpatient Prescriptions on File Prior to Visit  Medication Sig Dispense Refill  . diazepam (VALIUM) 5 MG tablet Take 5 mg by mouth every 8 (eight) hours as needed (pain).      . fluticasone (FLONASE) 50 MCG/ACT nasal spray USE 2 SPRAYS IN EACH NOSTRIL EVERY DAY  16 g  5  . HYDROcodone-acetaminophen (NORCO/VICODIN) 5-325 MG per tablet Take 1-2 tablets by mouth every 8 (eight) hours as needed for pain.      Marland Kitchen lipase/protease/amylase (CREON-12/PANCREASE) 12000 UNITS CPEP capsule Take 1 capsule by mouth 3 (three) times daily before meals.      . ondansetron (ZOFRAN) 8 MG tablet Take 1 tablet (8 mg total) by mouth every 12 (twelve) hours as needed.  15 tablet  0  . tiZANidine (ZANAFLEX) 2 MG tablet Take 4 mg by mouth every 8 (eight) hours as needed for muscle spasms.      . traZODone (DESYREL) 100 MG tablet Take 100 mg by mouth at bedtime as needed.       . zolpidem (AMBIEN) 10 MG tablet Take 10 mg by mouth at bedtime as needed for sleep.      Marland Kitchen amphetamine-dextroamphetamine (ADDERALL) 30 MG tablet Take 30 mg by mouth daily.       No current facility-administered medications on file prior to visit.     Review of Systems  Constitutional: Negative for fever and chills.  HENT: Positive for postnasal drip, rhinorrhea and sinus pressure.   Respiratory: Positive for cough.   Cardiovascular: Negative.   Gastrointestinal: Negative.   Psychiatric/Behavioral: Negative.        Objective:   Physical Exam  Constitutional: He is oriented to person, place, and time. He appears well-developed and well-nourished. No distress.  HENT:  Head: Normocephalic and atraumatic.  Right Ear: External ear normal.  Left Ear: External ear normal.  Pharyngeal erythema. No exudate. Tonsils slightly swollen  Cardiovascular:  Normal rate, regular rhythm and normal heart sounds.  Exam reveals no gallop.   No murmur heard. Pulmonary/Chest: Effort normal and breath sounds normal. No respiratory distress. He has no wheezes. He has no rales.  Lymphadenopathy:    He has no cervical adenopathy.  Neurological: He is alert and oriented to person, place, and time.  Skin: Skin is warm and dry.  Cyst like growth on upper left side of back.   Psychiatric: He has a normal mood and affect. His behavior is normal. Judgment and thought content normal.    BP 126/72  Pulse 88  Temp(Src) 97.8 F (36.6 C) (Oral)  Resp 12  Ht 6' (1.829 m)  Wt 219 lb (99.338 kg)  BMI 29.70 kg/m2  SpO2 98%       Assessment & Plan:

## 2013-02-05 NOTE — Progress Notes (Signed)
Pre visit review using our clinic review tool, if applicable. No additional management support is needed unless otherwise documented below in the visit note. 

## 2013-02-05 NOTE — Assessment & Plan Note (Signed)
Cyst has become painful. Refer to general surgery

## 2013-02-05 NOTE — Assessment & Plan Note (Signed)
Start Azithromycin. Continue the flonase. Use saline spray liberally.

## 2013-02-05 NOTE — Patient Instructions (Signed)
  Start Azithromycin 2 tablets today then 1 tablet daily for the next 4 days.  Continue flonase nasal spray.  Use saline spray as often as you like to keep sinuses moist and also to help irrigate.  Return to clinic if no improvement of symptoms.  I am referring you to general surgery for evaluation of the cyst on your back.  Amber will help schedule that for you today.

## 2013-02-12 ENCOUNTER — Encounter: Payer: Self-pay | Admitting: Internal Medicine

## 2013-02-12 ENCOUNTER — Ambulatory Visit (INDEPENDENT_AMBULATORY_CARE_PROVIDER_SITE_OTHER): Payer: Medicare Other | Admitting: Internal Medicine

## 2013-02-12 VITALS — BP 110/80 | HR 92 | Temp 98.2°F | Ht 72.0 in | Wt 221.5 lb

## 2013-02-12 DIAGNOSIS — G8929 Other chronic pain: Secondary | ICD-10-CM

## 2013-02-12 DIAGNOSIS — D649 Anemia, unspecified: Secondary | ICD-10-CM

## 2013-02-12 DIAGNOSIS — R945 Abnormal results of liver function studies: Secondary | ICD-10-CM

## 2013-02-12 DIAGNOSIS — R221 Localized swelling, mass and lump, neck: Secondary | ICD-10-CM

## 2013-02-12 DIAGNOSIS — R7989 Other specified abnormal findings of blood chemistry: Secondary | ICD-10-CM

## 2013-02-12 DIAGNOSIS — E871 Hypo-osmolality and hyponatremia: Secondary | ICD-10-CM

## 2013-02-12 DIAGNOSIS — M542 Cervicalgia: Secondary | ICD-10-CM

## 2013-02-12 DIAGNOSIS — R413 Other amnesia: Secondary | ICD-10-CM

## 2013-02-12 DIAGNOSIS — M549 Dorsalgia, unspecified: Secondary | ICD-10-CM

## 2013-02-12 DIAGNOSIS — E7800 Pure hypercholesterolemia, unspecified: Secondary | ICD-10-CM

## 2013-02-12 DIAGNOSIS — R220 Localized swelling, mass and lump, head: Secondary | ICD-10-CM

## 2013-02-12 LAB — HEPATIC FUNCTION PANEL
ALBUMIN: 4.4 g/dL (ref 3.5–5.2)
ALT: 42 U/L (ref 0–53)
AST: 31 U/L (ref 0–37)
Alkaline Phosphatase: 120 U/L — ABNORMAL HIGH (ref 39–117)
BILIRUBIN DIRECT: 0 mg/dL (ref 0.0–0.3)
TOTAL PROTEIN: 8.1 g/dL (ref 6.0–8.3)
Total Bilirubin: 0.5 mg/dL (ref 0.3–1.2)

## 2013-02-12 LAB — TSH: TSH: 1.47 u[IU]/mL (ref 0.35–5.50)

## 2013-02-12 NOTE — Progress Notes (Signed)
Subjective:    Patient ID: [name], male    DOB: [birth year], 53 y.o.   MRN: [id number]  HPI 53 year old male with past history of chronic back and abdominal pain, anoxic brain injury with resultant cognitive impairment who comes in today for a scheduled follow up.   He was admitted 11/23/12 with increased epigastric/abdominal pain.  Diagnosed with pancreatitis.  Was discharged on 10/26.  Had another episode of pain prior to Christmas.  Not hospitalized.  Doing better now.  Pain is better.  Trying to watch what he eats.  No nausea or vomiting.   From a cardiac standpoint - stable.  Asymptomatic.   Still seeing Dr Carloyn Manner - [facility].   Increased stress.  Taking care of his three kids.  Overall he feels he is doing relatively well handling the stress.   Still seeing Dr Toy Care.  Recently evaluated by Raquel Rey.  Treated with a zpak.  Feels better.  Still with persistent fullness/discomfort - left side of his neck.  Affects swallowing at times.  Eats ok.       Past Medical History  Diagnosis Date  . Hypercholesterolemia   . Pancreatitis   . Chronic back pain     s/p 5 back surgeries  . Mild cognitive impairment with memory loss   . Anoxic brain injury   . Depression   . Allergy     Current Outpatient Prescriptions on File Prior to Visit  Medication Sig Dispense Refill  . amphetamine-dextroamphetamine (ADDERALL) 30 MG tablet Take 30 mg by mouth daily.      . cetirizine (ZYRTEC) 10 MG tablet Take 10 mg by mouth daily.      . diazepam (VALIUM) 5 MG tablet Take 5 mg by mouth every 8 (eight) hours as needed (pain).      . fluticasone (FLONASE) 50 MCG/ACT nasal spray USE 2 SPRAYS IN EACH NOSTRIL EVERY DAY  16 g  5  . HYDROcodone-acetaminophen (NORCO/VICODIN) 5-325 MG per tablet Take 1-2 tablets by mouth every 8 (eight) hours as needed for pain.      Marland Kitchen ondansetron (ZOFRAN) 8 MG tablet Take 1 tablet (8 mg total) by mouth every 12 (twelve) hours as needed.  15 tablet  0  . tiZANidine  (ZANAFLEX) 2 MG tablet Take 4 mg by mouth every 8 (eight) hours as needed for muscle spasms.      . traZODone (DESYREL) 100 MG tablet Take 100 mg by mouth at bedtime as needed.       . zolpidem (AMBIEN) 10 MG tablet Take 10 mg by mouth at bedtime.        No current facility-administered medications on file prior to visit.    Review of Systems No headache or light headedness.  No dizziness.  No chest pain, tightness or palpitations.  No increased shortness of breath, cough or congestion.  Abdominal pain improved.  Eating.  No nausea or vomiting.   No bowel change, such as constipation, BRBPR or melana.   No urine change.  Feels he is handling stress relatively well.  Still following with his psychiatrist (Dr Toy Care).  Neck pain and swelling as outlined.       Objective:   Physical Exam  Filed Vitals:   02/12/13 0917  BP: 110/80  Pulse: 92  Temp: 98.2 F (45.40 C)   53 year old male in no acute distress.  HEENT:  Nares - clear.  Oropharynx - without lesions.   NECK:  Supple.  Minimal fullness and mild tenderness - left ant/lateral neck.  No thyromegaly appreciated.  No audible carotid bruit.  HEART:  Appears to be regular.   LUNGS:  No crackles or wheezing audible.  Respirations even and unlabored.   RADIAL PULSE:  Equal bilaterally.  ABDOMEN:  Soft.  No significant tenderness to palpation.  Bowel sounds present and normal.   EXTREMITIES:  No increased edema present.  DP pulses palpable and equal bilaterally.         Assessment & Plan:  CHRONIC ABDOMINAL PAIN.  Recently admitted with pancreatitis.   Is s/p recent cholecystectomy.  He has had multiple scans.  He has been seeing GI - Dr Lucas Mallow.   Eating and drinking now.  Due to follow up with [facility].  Recheck liver panel.  He is scheduling a f/u with GI.   ABNORMAL CXR.  CXR in the hospital revealed the ascending aorta to appear slightly larger and tortuous.  Was on and AP cxr.  Also had some minimal right basilar atelectasis.  F/u CXR  after last visit - clear.    PSYCH.  Sees Dr Toy Care.    CARDIOVASCULAR.  ECHO in the hospital revealed normal left ventricular systolic function, trivial PR, no stenosis.  Holter monitor unrevealing.  Saw cardiology.  Currently asymptomatic.      HEALTH MAINTENANCE.  Physical 05/03/12.   Has had colonoscopy.  Colonoscopy 12/12 - hyperplastic polyp.  PSA 05/03/12 - .78.

## 2013-02-12 NOTE — Progress Notes (Signed)
Pre-visit discussion using our clinic review tool. No additional management support is needed unless otherwise documented below in the visit note.  

## 2013-02-13 ENCOUNTER — Ambulatory Visit: Payer: Self-pay | Admitting: Internal Medicine

## 2013-02-13 ENCOUNTER — Encounter: Payer: Self-pay | Admitting: *Deleted

## 2013-02-14 ENCOUNTER — Encounter: Payer: Self-pay | Admitting: General Surgery

## 2013-02-14 ENCOUNTER — Ambulatory Visit (INDEPENDENT_AMBULATORY_CARE_PROVIDER_SITE_OTHER): Payer: Medicare Other | Admitting: General Surgery

## 2013-02-14 ENCOUNTER — Telehealth: Payer: Self-pay | Admitting: *Deleted

## 2013-02-14 VITALS — BP 148/88 | HR 88 | Resp 12 | Ht 73.0 in | Wt 221.0 lb

## 2013-02-14 DIAGNOSIS — L723 Sebaceous cyst: Secondary | ICD-10-CM

## 2013-02-14 NOTE — Patient Instructions (Addendum)
Keep area clean May leave dressing in place.  return in 10 days for suture removal

## 2013-02-14 NOTE — Telephone Encounter (Signed)
Pt notified that Thyroid U/S revealed no mass, no fluid collection, & no other acute abnormality. If persistent fullness would rec: referral to ENT for evaluation. He will let us know if decides to proceed with ENT referral

## 2013-02-14 NOTE — Progress Notes (Signed)
Patient ID: GLENVILLE ESPINA, male   DOB: [birth year], 53 y.o.   MRN: [id number]  Chief Complaint  Patient presents with  . Cyst    upper back    HPI Klever L Pattillo is a 53 y.o. male.  Here today for evaluation of medial upper back cyst.  States it has been there for about 1-2 months and would like it removed. States it has gradually gotten larger and sore over the past 3 weeks.   HPI  Past Medical History  Diagnosis Date  . Hypercholesterolemia   . Pancreatitis   . Chronic back pain     s/p 5 back surgeries  . Mild cognitive impairment with memory loss   . Anoxic brain injury   . Depression   . Allergy     Past Surgical History  Procedure Laterality Date  . Cholecystectomy  2013  . Back surgery      multiple back surgeries (5)  . Vasectomy      With Reversal  . Spine surgery    . Vein surgery Right 2013    arm    Family History  Problem Relation Age of Onset  . Heart disease Father   . Hypertension    . Colon cancer    . Heart disease Mother     Social History History  Substance Use Topics  . Smoking status: Never Smoker   . Smokeless tobacco: Never Used  . Alcohol Use: No    Allergies  Allergen Reactions  . Penicillins Anaphylaxis  . Sulfa Antibiotics     Current Outpatient Prescriptions  Medication Sig Dispense Refill  . amphetamine-dextroamphetamine (ADDERALL) 30 MG tablet Take 30 mg by mouth daily.      . cetirizine (ZYRTEC) 10 MG tablet Take 10 mg by mouth daily.      . diazepam (VALIUM) 5 MG tablet Take 5 mg by mouth every 8 (eight) hours as needed (pain).      . fluticasone (FLONASE) 50 MCG/ACT nasal spray USE 2 SPRAYS IN EACH NOSTRIL EVERY DAY  16 g  5  . tiZANidine (ZANAFLEX) 2 MG tablet Take 4 mg by mouth every 8 (eight) hours as needed for muscle spasms.      . traZODone (DESYREL) 100 MG tablet Take 100 mg by mouth at bedtime as needed.       . zolpidem (AMBIEN) 10 MG tablet Take 10 mg by mouth at bedtime.        No current  facility-administered medications for this visit.    Review of Systems Review of Systems  Constitutional: Negative.   Respiratory: Negative.   Cardiovascular: Negative.     Blood pressure 148/88, pulse 88, resp. rate 12, height 6\' 1"  (1.854 m), weight 221 lb (100.245 kg).  Physical Exam Physical Exam  Constitutional: He is oriented to person, place, and time. He appears well-developed and well-nourished.  Neurological: He is alert and oriented to person, place, and time.  Skin: Skin is warm and dry.  1.5 cm sebaceous cyst to left of midline posterior back.       Assessment    Symptomatic sebaceous cyst of the left back.     Plan    Excision was acceptable the patient. The area was prepped with alcohol and 10 cc of 0.5% Xylocaine with 0.25% Marcaine with 1-200,000 of epinephrine was instilled. ChloraPrep was applied to the skin. Through elliptical incision the central pore as well as the sebaceous cyst was excised. One vessel required suture ligation with  3-0 Vicryl. The deep tissue was approximate with interrupted 3-0 Vicryl sutures. The skin was closed with 4-0 nylon sutures. Telfa and Tegaderm dressing applied.  A prescription for Norco 5/325, #30 with the inscription one or 2 p.o. Q.4 h. P.r.n. For pain was provided.  The patient return for suture removal in 10-12 days.        Robert Bellow 02/14/2013, 10:14 PM

## 2013-02-15 LAB — PATHOLOGY

## 2013-02-16 ENCOUNTER — Encounter: Payer: Self-pay | Admitting: Internal Medicine

## 2013-02-16 NOTE — Assessment & Plan Note (Signed)
Being followed at [facility] neurology.  Appears to be better off increased pain meds.  Follow.

## 2013-02-16 NOTE — Assessment & Plan Note (Signed)
Low cholesterol diet and exercise.  Follow.   

## 2013-02-16 NOTE — Assessment & Plan Note (Signed)
Check neck ultrasound.  Further w/up pending results.  May need ENT referral.

## 2013-02-16 NOTE — Assessment & Plan Note (Signed)
Last hgb normal.  Follow.   

## 2013-02-16 NOTE — Assessment & Plan Note (Signed)
Last sodium check wnl.  

## 2013-02-16 NOTE — Assessment & Plan Note (Signed)
Followed at pain clinic.  Gets all his pain meds there.

## 2013-02-25 ENCOUNTER — Ambulatory Visit (INDEPENDENT_AMBULATORY_CARE_PROVIDER_SITE_OTHER): Payer: Medicare Other | Admitting: *Deleted

## 2013-02-25 DIAGNOSIS — L723 Sebaceous cyst: Secondary | ICD-10-CM

## 2013-02-25 NOTE — Progress Notes (Signed)
The sutures were removed and steri strips applied. 

## 2013-02-25 NOTE — Patient Instructions (Signed)
Patient to return as needed. Patient advised to contact our office with any new questions or concerns.

## 2013-06-03 ENCOUNTER — Encounter: Payer: Self-pay | Admitting: Internal Medicine

## 2013-06-03 ENCOUNTER — Ambulatory Visit (INDEPENDENT_AMBULATORY_CARE_PROVIDER_SITE_OTHER): Payer: Medicare Other | Admitting: Internal Medicine

## 2013-06-03 VITALS — BP 110/70 | HR 108 | Temp 98.3°F | Ht 71.0 in | Wt 211.8 lb

## 2013-06-03 DIAGNOSIS — R413 Other amnesia: Secondary | ICD-10-CM

## 2013-06-03 DIAGNOSIS — E871 Hypo-osmolality and hyponatremia: Secondary | ICD-10-CM

## 2013-06-03 DIAGNOSIS — D649 Anemia, unspecified: Secondary | ICD-10-CM

## 2013-06-03 DIAGNOSIS — G8929 Other chronic pain: Secondary | ICD-10-CM

## 2013-06-03 DIAGNOSIS — K859 Acute pancreatitis without necrosis or infection, unspecified: Secondary | ICD-10-CM

## 2013-06-03 DIAGNOSIS — R0989 Other specified symptoms and signs involving the circulatory and respiratory systems: Secondary | ICD-10-CM

## 2013-06-03 DIAGNOSIS — R220 Localized swelling, mass and lump, head: Secondary | ICD-10-CM

## 2013-06-03 DIAGNOSIS — R0602 Shortness of breath: Secondary | ICD-10-CM

## 2013-06-03 DIAGNOSIS — R221 Localized swelling, mass and lump, neck: Secondary | ICD-10-CM

## 2013-06-03 DIAGNOSIS — Z125 Encounter for screening for malignant neoplasm of prostate: Secondary | ICD-10-CM

## 2013-06-03 DIAGNOSIS — E7800 Pure hypercholesterolemia, unspecified: Secondary | ICD-10-CM

## 2013-06-03 DIAGNOSIS — R0609 Other forms of dyspnea: Secondary | ICD-10-CM

## 2013-06-03 DIAGNOSIS — M549 Dorsalgia, unspecified: Secondary | ICD-10-CM

## 2013-06-03 LAB — HEPATIC FUNCTION PANEL
ALK PHOS: 102 U/L (ref 39–117)
ALT: 52 U/L (ref 0–53)
AST: 35 U/L (ref 0–37)
Albumin: 4.4 g/dL (ref 3.5–5.2)
BILIRUBIN DIRECT: 0.1 mg/dL (ref 0.0–0.3)
TOTAL PROTEIN: 7.6 g/dL (ref 6.0–8.3)
Total Bilirubin: 0.8 mg/dL (ref 0.2–1.2)

## 2013-06-03 LAB — BASIC METABOLIC PANEL
BUN: 13 mg/dL (ref 6–23)
CALCIUM: 9.7 mg/dL (ref 8.4–10.5)
CHLORIDE: 101 meq/L (ref 96–112)
CO2: 27 mEq/L (ref 19–32)
CREATININE: 1 mg/dL (ref 0.4–1.5)
GFR: 80.09 mL/min (ref >60.00)
Glucose, Bld: 84 mg/dL (ref 70–99)
Potassium: 4.4 mEq/L (ref 3.5–5.1)
Sodium: 137 mEq/L (ref 135–145)

## 2013-06-03 LAB — PSA: PSA: 0.81 ng/mL (ref 0.10–4.00)

## 2013-06-03 LAB — LIPID PANEL
CHOLESTEROL: 160 mg/dL (ref 0–200)
HDL: 41 mg/dL (ref >39.00)
LDL CALC: 90 mg/dL (ref 0–99)
Total CHOL/HDL Ratio: 4
Triglycerides: 145 mg/dL (ref 0.0–149.0)
VLDL: 29 mg/dL (ref 0.0–40.0)

## 2013-06-03 NOTE — Progress Notes (Signed)
Subjective:    Patient ID: [name], male    DOB: Dec 22, 1960, 53 y.o.   MRN: [id number]  HPI 53 year old male with past history of chronic back and abdominal pain, anoxic brain injury with resultant cognitive impairment who comes in today to follow up on these issues as well as for a complete physical exam.    He was admitted 11/23/12 with increased epigastric/abdominal pain.  Diagnosed with pancreatitis.  Was discharged on 10/26.  Had another episode of pain prior to Christmas.  Not hospitalized.  Doing better now.  Pain is better.  Trying to watch what he eats.  No nausea or vomiting.   Still seeing Dr Carloyn Manner - [facility].   Increased stress.  Taking care of his three kids.  Overall he feels he is doing relatively well handling the stress.   Still seeing Dr Toy Care.   He does report that when he walks or exerts himself (and sometimes occurs with sitting), he gets fatigued.  Some DOE.  No chest pain.  Decreased stamina.  Does not appear to be improving with increased activity.        Past Medical History  Diagnosis Date  . Hypercholesterolemia   . Pancreatitis   . Chronic back pain     s/p 5 back surgeries  . Mild cognitive impairment with memory loss   . Anoxic brain injury   . Depression   . Allergy     Current Outpatient Prescriptions on File Prior to Visit  Medication Sig Dispense Refill  . cetirizine (ZYRTEC) 10 MG tablet Take 10 mg by mouth daily.      . diazepam (VALIUM) 5 MG tablet Take 5-10 mg by mouth every 8 (eight) hours as needed (pain).       . fluticasone (FLONASE) 50 MCG/ACT nasal spray USE 2 SPRAYS IN EACH NOSTRIL EVERY DAY  16 g  5  . traZODone (DESYREL) 100 MG tablet Take 100 mg by mouth at bedtime as needed.       . zolpidem (AMBIEN) 10 MG tablet Take 10 mg by mouth at bedtime.        No current facility-administered medications on file prior to visit.    Review of Systems No headache or light headedness.  No dizziness.  No chest pain, tightness or  palpitations.  No cough or congestion.  Abdominal pain improved.  Eating.  No nausea or vomiting.   No bowel change, such as constipation, BRBPR or melana.   No urine change.  Feels he is handling stress relatively well.  Still following with his psychiatrist (Dr Toy Care).  On extended release Adderall now.  Doing well with this.  Does report getting fatigued with exertion.  Also occurs with sitting.  Has a strong family history of early heart disease.       Objective:   Physical Exam  Filed Vitals:   06/03/13 1029  BP: 110/70  Pulse: 108  Temp: 98.3 F (44.2 C)   53 year old male in no acute distress.  HEENT:  Nares - clear.  Oropharynx - without lesions. NECK:  Supple.  Nontender.  No audible carotid bruit.  HEART:  Appears to be regular.   LUNGS:  No crackles or wheezing audible.  Respirations even and unlabored.   RADIAL PULSE:  Equal bilaterally.  ABDOMEN:  Soft.  Nontender.  Bowel sounds present and normal.  No audible abdominal bruit.  GU:  Normal descended testicles.  No palpable testicular nodules.  RECTAL:  Could not appreciate any palpable prostate nodules.  Heme negative.   EXTREMITIES:  No increased edema present.  DP pulses palpable and equal bilaterally.         Assessment & Plan:  CHRONIC ABDOMINAL PAIN.   Is s/p cholecystectomy.  He has had multiple scans.  He has been seeing GI - Dr Lucas Mallow.  Eating and drinking well.  Eating better.   Recheck liver panel.  Currently doing well.    ABNORMAL CXR.  CXR in the hospital revealed the ascending aorta to appear slightly larger and tortuous.  Was on and AP cxr.  Also had some minimal right basilar atelectasis.  F/u CXR - clear.    PSYCH.  Sees Dr Toy Care.    CARDIOVASCULAR.  ECHO in the hospital revealed normal left ventricular systolic function, trivial PR, no stenosis.  Holter monitor unrevealing.  Now with symptoms as outlined.   EKG obtained and revealed SR with no acute ischemic changes.  Will refer to cardiology for further  evaluation and question of need for further w/up.     HEALTH MAINTENANCE.  Physical today.   Has had colonoscopy.  Colonoscopy 12/12 - hyperplastic polyp.  PSA 05/03/12 - .78.   Check psa today.

## 2013-06-03 NOTE — Progress Notes (Signed)
Pre visit review using our clinic review tool, if applicable. No additional management support is needed unless otherwise documented below in the visit note. 

## 2013-06-03 NOTE — Assessment & Plan Note (Signed)
Followed at pain clinic.  Gets all his pain meds there.

## 2013-06-04 ENCOUNTER — Encounter: Payer: Self-pay | Admitting: *Deleted

## 2013-06-04 ENCOUNTER — Encounter: Payer: Self-pay | Admitting: Internal Medicine

## 2013-06-04 DIAGNOSIS — R0609 Other forms of dyspnea: Secondary | ICD-10-CM | POA: Insufficient documentation

## 2013-06-04 NOTE — Assessment & Plan Note (Signed)
Being followed at [facility] neurology.  Appears to be better off increased pain meds.  Follow.

## 2013-06-04 NOTE — Assessment & Plan Note (Signed)
Low cholesterol diet and exercise.  Follow.   

## 2013-06-04 NOTE — Assessment & Plan Note (Signed)
Ultrasound negative.  No fullness or problems.  Follow.

## 2013-06-04 NOTE — Assessment & Plan Note (Signed)
Last hgb normal.  Follow.   

## 2013-06-04 NOTE — Assessment & Plan Note (Signed)
Has noticed increased fatigue with exertion as outlined.  Has a strong family history of early heart disease.  EKG as outlined.  Refer to cardiology for further evaluation and question of need for further testing and w/up.  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

## 2013-06-04 NOTE — Assessment & Plan Note (Signed)
Last sodium check wnl.  

## 2013-06-25 ENCOUNTER — Ambulatory Visit: Payer: Medicare Other | Admitting: Cardiovascular Disease

## 2013-06-27 ENCOUNTER — Encounter: Payer: Self-pay | Admitting: Cardiovascular Disease

## 2013-06-27 ENCOUNTER — Ambulatory Visit (INDEPENDENT_AMBULATORY_CARE_PROVIDER_SITE_OTHER): Payer: Medicare Other | Admitting: Cardiovascular Disease

## 2013-06-27 VITALS — BP 145/90 | HR 108 | Ht 73.0 in | Wt 214.2 lb

## 2013-06-27 DIAGNOSIS — I498 Other specified cardiac arrhythmias: Secondary | ICD-10-CM

## 2013-06-27 DIAGNOSIS — R079 Chest pain, unspecified: Secondary | ICD-10-CM

## 2013-06-27 DIAGNOSIS — R0989 Other specified symptoms and signs involving the circulatory and respiratory systems: Secondary | ICD-10-CM

## 2013-06-27 DIAGNOSIS — R000 Tachycardia, unspecified: Secondary | ICD-10-CM | POA: Insufficient documentation

## 2013-06-27 DIAGNOSIS — R0609 Other forms of dyspnea: Secondary | ICD-10-CM

## 2013-06-27 DIAGNOSIS — R0602 Shortness of breath: Secondary | ICD-10-CM

## 2013-06-27 MED ORDER — METOPROLOL TARTRATE 25 MG PO TABS: 25.0000 mg | ORAL_TABLET | Freq: Two times a day (BID) | ORAL | Status: DC

## 2013-06-27 NOTE — Assessment & Plan Note (Addendum)
He has a resting sinus tachycardia. Baseline heart rate increased from 108 in the lying position to 130 beats per minute in the sitting position. When he is tachycardic, he does not feel well. There is the  possibility of postural orthostatic tachycardia syndrome. Adderal might be contributing to his tachycardia. I recommend starting treatment with metoprolol 25 mg twice daily given his significant symptoms related to tachycardia. Previous echocardiogram in 2014 at [facility] showed normal LV systolic function with no significant valvular abnormalities.

## 2013-06-27 NOTE — Patient Instructions (Addendum)
Please start metoprolol tartrate 25mg  twice a day  Your physician recommends that you schedule a follow-up appointment in: 3 months  [place] caregiver has ordered a Stress Test with nuclear imaging. The purpose of this test is to evaluate the blood supply to your heart muscle. This procedure is referred to as a "Non-Invasive Stress Test." This is because other than having an IV started in your vein, nothing is inserted or "invades" your body. Cardiac stress tests are done to find areas of poor blood flow to the heart by determining the extent of coronary artery disease (CAD). Some patients exercise on a treadmill, which naturally increases the blood flow to your heart, while others who are  unable to walk on a treadmill due to physical limitations have a pharmacologic/chemical stress agent called Lexiscan . This medicine will mimic walking on a treadmill by temporarily increasing your coronary blood flow.   Please note: these test may take anywhere between 2-4 hours to complete  PLEASE REPORT TO [facility] AT THE FIRST DESK WILL DIRECT YOU WHERE TO GO  Date of Procedure:______Tuesday, June 2_____________  Arrival Time for Procedure:______10:15am_______________  Instructions regarding medication:   __X__:  Hold betablocker(s) night before procedure and morning of procedure: METOPROLOL   PLEASE NOTIFY THE OFFICE AT LEAST 24 HOURS IN ADVANCE IF YOU ARE UNABLE TO KEEP YOUR APPOINTMENT.  [phone] AND  PLEASE NOTIFY NUCLEAR MEDICINE AT [facility] AT LEAST 24 HOURS IN ADVANCE IF YOU ARE UNABLE TO KEEP YOUR APPOINTMENT. [phone]  How to prepare for your Myoview test:  1. Do not eat or drink after midnight 2. No caffeine for 24 hours prior to test 3. No smoking 24 hours prior to test. 4. Your medication may be taken with water.  If your doctor stopped a medication because of this test, do not take that medication. 5. Ladies, please do not wear  dresses.  Skirts or pants are appropriate. Please wear a short sleeve shirt. 6. No perfume, cologne or lotion. 7. Wear comfortable walking shoes. No heels!

## 2013-06-27 NOTE — Assessment & Plan Note (Signed)
The chest pain is overall atypical. However, he has significant exertional dyspnea. He has a strong family history of premature coronary artery disease. Thus, I recommend evaluation with a pharmacologic nuclear stress test. He is not able to exercise on a treadmill due to chronic back pain related to multiple surgeries in the past.

## 2013-06-27 NOTE — Progress Notes (Signed)
Primary care physician: Dr. Einar Pheasant  HPI  This is a pleasant 53 year old man who was referred for evaluation of chest pain and shortness of breath. He has very strong family history of premature coronary artery disease. He had cardiac catheterization done in 2007 which showed only 30% stenosis in proximal LAD with normal ejection fraction. He suffers from chronic back pain and had multiple surgeries on his back. He is disabled due to that reason. He also suffers from mild anoxic brain injury from what seems to be a narcotic overdose in the emergency room according to the patient. He has been on Adderall for one year for that reason. He complains of chronic tachycardia. When his heart rate is fast, he feels tired with occasional chest discomfort and shortness of breath. His physical activities are limited by his back problems. He occasionally gets dizzy. He denies orthopnea or PND. No syncope. He is a lifelong nonsmoker and does not drink alcohol. He is currently on disability.   Allergies  Allergen Reactions  . Penicillins Anaphylaxis  . Sulfa Antibiotics      Current Outpatient Prescriptions on File Prior to Visit  Medication Sig Dispense Refill  . amphetamine-dextroamphetamine (ADDERALL XR) 30 MG 24 hr capsule Take 60 mg by mouth daily.      . cetirizine (ZYRTEC) 10 MG tablet Take 10 mg by mouth daily.      . diazepam (VALIUM) 5 MG tablet Take 5-10 mg by mouth every 8 (eight) hours as needed (pain).       Marland Kitchen tiZANidine (ZANAFLEX) 4 MG tablet Take 4 mg by mouth 3 (three) times daily.      . traZODone (DESYREL) 100 MG tablet Take 100 mg by mouth at bedtime as needed.       . zolpidem (AMBIEN) 10 MG tablet Take 10 mg by mouth at bedtime.        No current facility-administered medications on file prior to visit.     Past Medical History  Diagnosis Date  . Hypercholesterolemia   . Pancreatitis   . Chronic back pain     s/p 5 back surgeries  . Mild cognitive impairment with  memory loss   . Anoxic brain injury   . Depression   . Allergy   . H/O acute pancreatitis   . Coronary artery disease     Cardiac catheterization in 2007 at [facility] showed 30% stenosis in proximal LAD. No other disease. Ejection fraction was 65%     Past Surgical History  Procedure Laterality Date  . Cholecystectomy  2013  . Back surgery      multiple back surgeries (5)  . Vasectomy      With Reversal  . Spine surgery    . Vein surgery Right 2013    arm  . Cardiac catheterization      [facility]      Family History  Problem Relation Age of Onset  . Heart disease Father   . Heart attack Father   . Hypertension    . Colon cancer    . Heart disease Mother   . Heart attack Mother   . Heart attack Brother     MI's x 2      History   Social History  . Marital Status: Married    Spouse Name: N/A    Number of Children: 3  . Years of Education: N/A   Occupational History  . Not on file.   Social History Main Topics  . Smoking status:  Never Smoker   . Smokeless tobacco: Never Used  . Alcohol Use: No  . Drug Use: No  . Sexual Activity: Not on file   Other Topics Concern  . Not on file   Social History Narrative  . No narrative on file     ROS A 10 point review of system was performed. It is negative other than that mentioned in the history of present illness.   PHYSICAL EXAM   BP 145/90  Pulse 108  Ht 6\' 1"  (1.854 m)  Wt 214 lb 4 oz (97.183 kg)  BMI 28.27 kg/m2 Constitutional: He is oriented to person, place, and time. He appears well-developed and well-nourished. No distress.  HENT: No nasal discharge.  Head: Normocephalic and atraumatic.  Eyes: Pupils are equal and round.  No discharge. Neck: Normal range of motion. Neck supple. No JVD present. No thyromegaly present.  Cardiovascular: Normal rate, regular rhythm, normal heart sounds. Exam reveals no gallop and no friction rub. No murmur heard.  Pulmonary/Chest: Effort normal and breath sounds normal. No  stridor. No respiratory distress. He has no wheezes. He has no rales. He exhibits no tenderness.  Abdominal: Soft. Bowel sounds are normal. He exhibits no distension. There is no tenderness. There is no rebound and no guarding.  Musculoskeletal: Normal range of motion. He exhibits no edema and no tenderness.  Neurological: He is alert and oriented to person, place, and time. Coordination normal.  Skin: Skin is warm and dry. No rash noted. He is not diaphoretic. No erythema. No pallor.  Psychiatric: He has a normal mood and affect. His behavior is normal. Judgment and thought content normal.       EKG: Sinus tachycardia with left axis deviation.   ASSESSMENT AND PLAN

## 2013-07-02 ENCOUNTER — Ambulatory Visit: Payer: Self-pay | Admitting: Cardiovascular Disease

## 2013-07-02 ENCOUNTER — Other Ambulatory Visit: Payer: Self-pay

## 2013-07-02 DIAGNOSIS — R079 Chest pain, unspecified: Secondary | ICD-10-CM

## 2013-07-02 DIAGNOSIS — R0602 Shortness of breath: Secondary | ICD-10-CM

## 2013-07-11 ENCOUNTER — Telehealth: Payer: Self-pay | Admitting: Nurse Practitioner

## 2013-07-11 NOTE — Telephone Encounter (Signed)
Pt called this AM reporting that yesterday he was feeling LH and tachycardic.  He took his metoprolol and felt his HR came down into the 80's from low 100's.  Today, he has been feeling fatigued and lightheaded.  His HR has been running in the low 100's. He has known sinus tachycardia, and he seems to perseverate on this a bit.  He wears a fit bit and says he watches his HR constantly.  He just took 15mg  of valium and says right now he feels ok.  I explained that the bb may be the culprit for both his fatigue and LH.  I rec that he check his BP if he has recurrent LH and if it his BP is < 100, he should contact us so that we can either adjust his bb dose or switch him off altogether, given fatigue as well.  If he has c/p or symptomatic tachy/LH/hypotn, I advised that he call EMS.  He verbalized understanding.

## 2013-07-29 ENCOUNTER — Encounter: Payer: Self-pay | Admitting: Internal Medicine

## 2013-07-29 DIAGNOSIS — K861 Other chronic pancreatitis: Secondary | ICD-10-CM

## 2013-09-27 ENCOUNTER — Encounter: Payer: Self-pay | Admitting: Cardiovascular Disease

## 2013-09-27 ENCOUNTER — Ambulatory Visit (INDEPENDENT_AMBULATORY_CARE_PROVIDER_SITE_OTHER): Payer: Medicare Other | Admitting: Cardiovascular Disease

## 2013-09-27 VITALS — BP 138/91 | HR 121 | Ht 73.0 in | Wt 222.0 lb

## 2013-09-27 DIAGNOSIS — I498 Other specified cardiac arrhythmias: Secondary | ICD-10-CM

## 2013-09-27 DIAGNOSIS — R0609 Other forms of dyspnea: Secondary | ICD-10-CM

## 2013-09-27 DIAGNOSIS — R0989 Other specified symptoms and signs involving the circulatory and respiratory systems: Secondary | ICD-10-CM

## 2013-09-27 DIAGNOSIS — R079 Chest pain, unspecified: Secondary | ICD-10-CM

## 2013-09-27 DIAGNOSIS — R000 Tachycardia, unspecified: Secondary | ICD-10-CM

## 2013-09-27 MED ORDER — METOPROLOL TARTRATE 50 MG PO TABS
50.0000 mg | ORAL_TABLET | Freq: Two times a day (BID) | ORAL | Status: DC
Start: 2013-09-27 — End: 2014-03-14

## 2013-09-27 NOTE — Assessment & Plan Note (Signed)
Likely due to physical deconditioning. Sinus tachycardia might be contributing.

## 2013-09-27 NOTE — Patient Instructions (Signed)
Your physician has recommended you make the following change in your medication:  Increase Metoprolol to 50 mg twice daily   Your physician wants you to follow-up in: 6 months with Dr. Fletcher Anon. You will receive a reminder letter in the mail two months in advance. If you don't receive a letter, please call our office to schedule the follow-up appointment.

## 2013-09-27 NOTE — Progress Notes (Signed)
Primary care physician: Dr. Einar Pheasant  HPI  This is a pleasant 53 year old man who is here today for a followup visit regarding shortness of breath and sinus tachycardia.  He has very strong family history of premature coronary artery disease. He had cardiac catheterization done in 2007 which showed only 30% stenosis in proximal LAD with normal ejection fraction. He suffers from chronic back pain and had multiple surgeries on his back. He is disabled due to that reason. He also suffers from mild anoxic brain injury from what seems to be a narcotic overdose in the emergency room according to the patient. He has been on Adderall for one year for that reason. He complains of chronic tachycardia. When his heart rate is fast, he feels tired with occasional chest discomfort and shortness of breath. His physical activities are limited by his back problems. He occasionally gets dizzy. He denies orthopnea or PND. No syncope. He is a lifelong nonsmoker and does not drink alcohol.  For chest pain, I evaluated him with a pharmacologic nuclear stress test which showed no evidence of ischemia with normal ejection fraction. I started him on metoprolol 25 mg twice daily. He had improvement in symptoms but continues to be tachycardic.     Allergies  Allergen Reactions  . Penicillins Anaphylaxis  . Sulfa Antibiotics      Current Outpatient Prescriptions on File Prior to Visit  Medication Sig Dispense Refill  . amphetamine-dextroamphetamine (ADDERALL XR) 30 MG 24 hr capsule Take 60 mg by mouth daily.      . cetirizine (ZYRTEC) 10 MG tablet Take 10 mg by mouth daily.      . diazepam (VALIUM) 5 MG tablet Take 5-10 mg by mouth every 8 (eight) hours as needed (pain).       . metoprolol tartrate (LOPRESSOR) 25 MG tablet Take 1 tablet (25 mg total) by mouth 2 (two) times daily.  180 tablet  3  . tiZANidine (ZANAFLEX) 4 MG tablet Take 4 mg by mouth 3 (three) times daily.      . traZODone (DESYREL) 100 MG tablet  Take 100 mg by mouth at bedtime as needed.       . zolpidem (AMBIEN) 10 MG tablet Take 10 mg by mouth at bedtime.        No current facility-administered medications on file prior to visit.     Past Medical History  Diagnosis Date  . Hypercholesterolemia   . Pancreatitis   . Chronic back pain     s/p 5 back surgeries  . Mild cognitive impairment with memory loss   . Anoxic brain injury   . Depression   . Allergy   . H/O acute pancreatitis   . Coronary artery disease     Cardiac catheterization in 2007 at [facility] showed 30% stenosis in proximal LAD. No other disease. Ejection fraction was 65%     Past Surgical History  Procedure Laterality Date  . Cholecystectomy  2013  . Back surgery      multiple back surgeries (5)  . Vasectomy      With Reversal  . Spine surgery    . Vein surgery Right 2013    arm  . Cardiac catheterization      [facility]   . Trigger point injection       Family History  Problem Relation Age of Onset  . Heart disease Father   . Heart attack Father   . Hypertension    . Colon cancer    .  Heart disease Mother   . Heart attack Mother   . Heart attack Brother     MI's x 2      History   Social History  . Marital Status: Married    Spouse Name: N/A    Number of Children: 3  . Years of Education: N/A   Occupational History  . Not on file.   Social History Main Topics  . Smoking status: Never Smoker   . Smokeless tobacco: Never Used  . Alcohol Use: No  . Drug Use: No  . Sexual Activity: Not on file   Other Topics Concern  . Not on file   Social History Narrative  . No narrative on file     ROS A 10 point review of system was performed. It is negative other than that mentioned in the history of present illness.   PHYSICAL EXAM   BP 138/91  Pulse 121  Ht 6\' 1"  (1.854 m)  Wt 222 lb (100.699 kg)  BMI 29.30 kg/m2 Constitutional: He is oriented to person, place, and time. He appears well-developed and well-nourished. No distress.    HENT: No nasal discharge.  Head: Normocephalic and atraumatic.  Eyes: Pupils are equal and round.  No discharge. Neck: Normal range of motion. Neck supple. No JVD present. No thyromegaly present.  Cardiovascular: Normal rate, regular rhythm, normal heart sounds. Exam reveals no gallop and no friction rub. No murmur heard.  Pulmonary/Chest: Effort normal and breath sounds normal. No stridor. No respiratory distress. He has no wheezes. He has no rales. He exhibits no tenderness.  Abdominal: Soft. Bowel sounds are normal. He exhibits no distension. There is no tenderness. There is no rebound and no guarding.  Musculoskeletal: Normal range of motion. He exhibits no edema and no tenderness.  Neurological: He is alert and oriented to person, place, and time. Coordination normal.  Skin: Skin is warm and dry. No rash noted. He is not diaphoretic. No erythema. No pallor.  Psychiatric: He has a normal mood and affect. His behavior is normal. Judgment and thought content normal.       EKG: Sinus  Tachycardia  WITHIN NORMAL LIMITS   ASSESSMENT AND PLAN

## 2013-09-27 NOTE — Assessment & Plan Note (Signed)
This is likely due to chronic pain in the use of amphetamine. I increased the dose of metoprolol to 50 mg twice daily.

## 2013-10-29 ENCOUNTER — Other Ambulatory Visit: Payer: Self-pay | Admitting: Internal Medicine

## 2013-11-13 ENCOUNTER — Telehealth: Payer: Self-pay | Admitting: Internal Medicine

## 2013-11-13 NOTE — Telephone Encounter (Signed)
Apparently did not schedule f/u appt.  Needs f/u appt (17min) scheduled.  Thanks.

## 2014-01-09 ENCOUNTER — Ambulatory Visit: Payer: Medicare Other | Admitting: Internal Medicine

## 2014-01-20 ENCOUNTER — Other Ambulatory Visit: Payer: Self-pay | Admitting: *Deleted

## 2014-01-20 MED ORDER — FLUTICASONE PROPIONATE 50 MCG/ACT NA SUSP: 2.0000 | Freq: Every day | NASAL | Status: DC

## 2014-01-20 NOTE — Telephone Encounter (Signed)
Fax from pharmacy, requesting 90 day supply 

## 2014-01-24 IMAGING — CR DG CHEST 2V
1 series · 3 of 3 positions shown · non-contrast
Comparison: none

REASON FOR EXAM: abd pain
COMMENTS:

[Series 1: pa · 0.17mm/px · 3 of 3 slices shown]
[im 1/3]
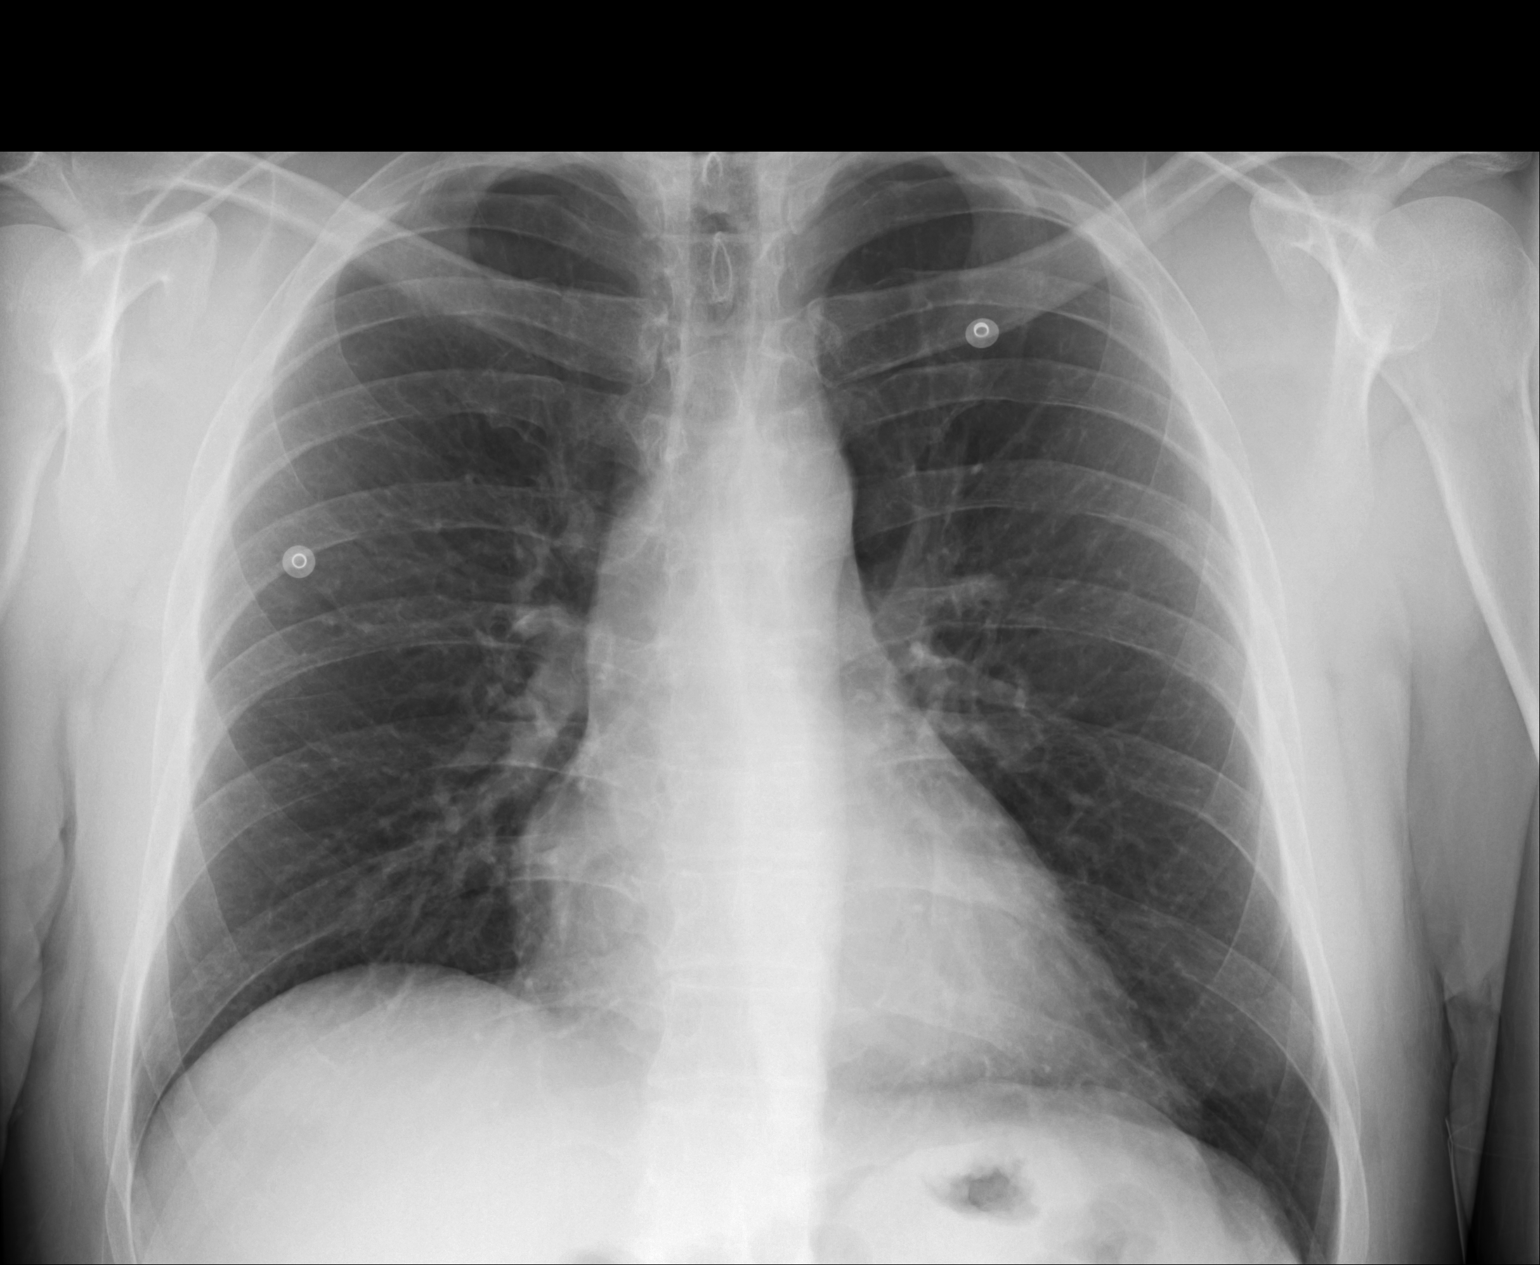
[im 2/3]
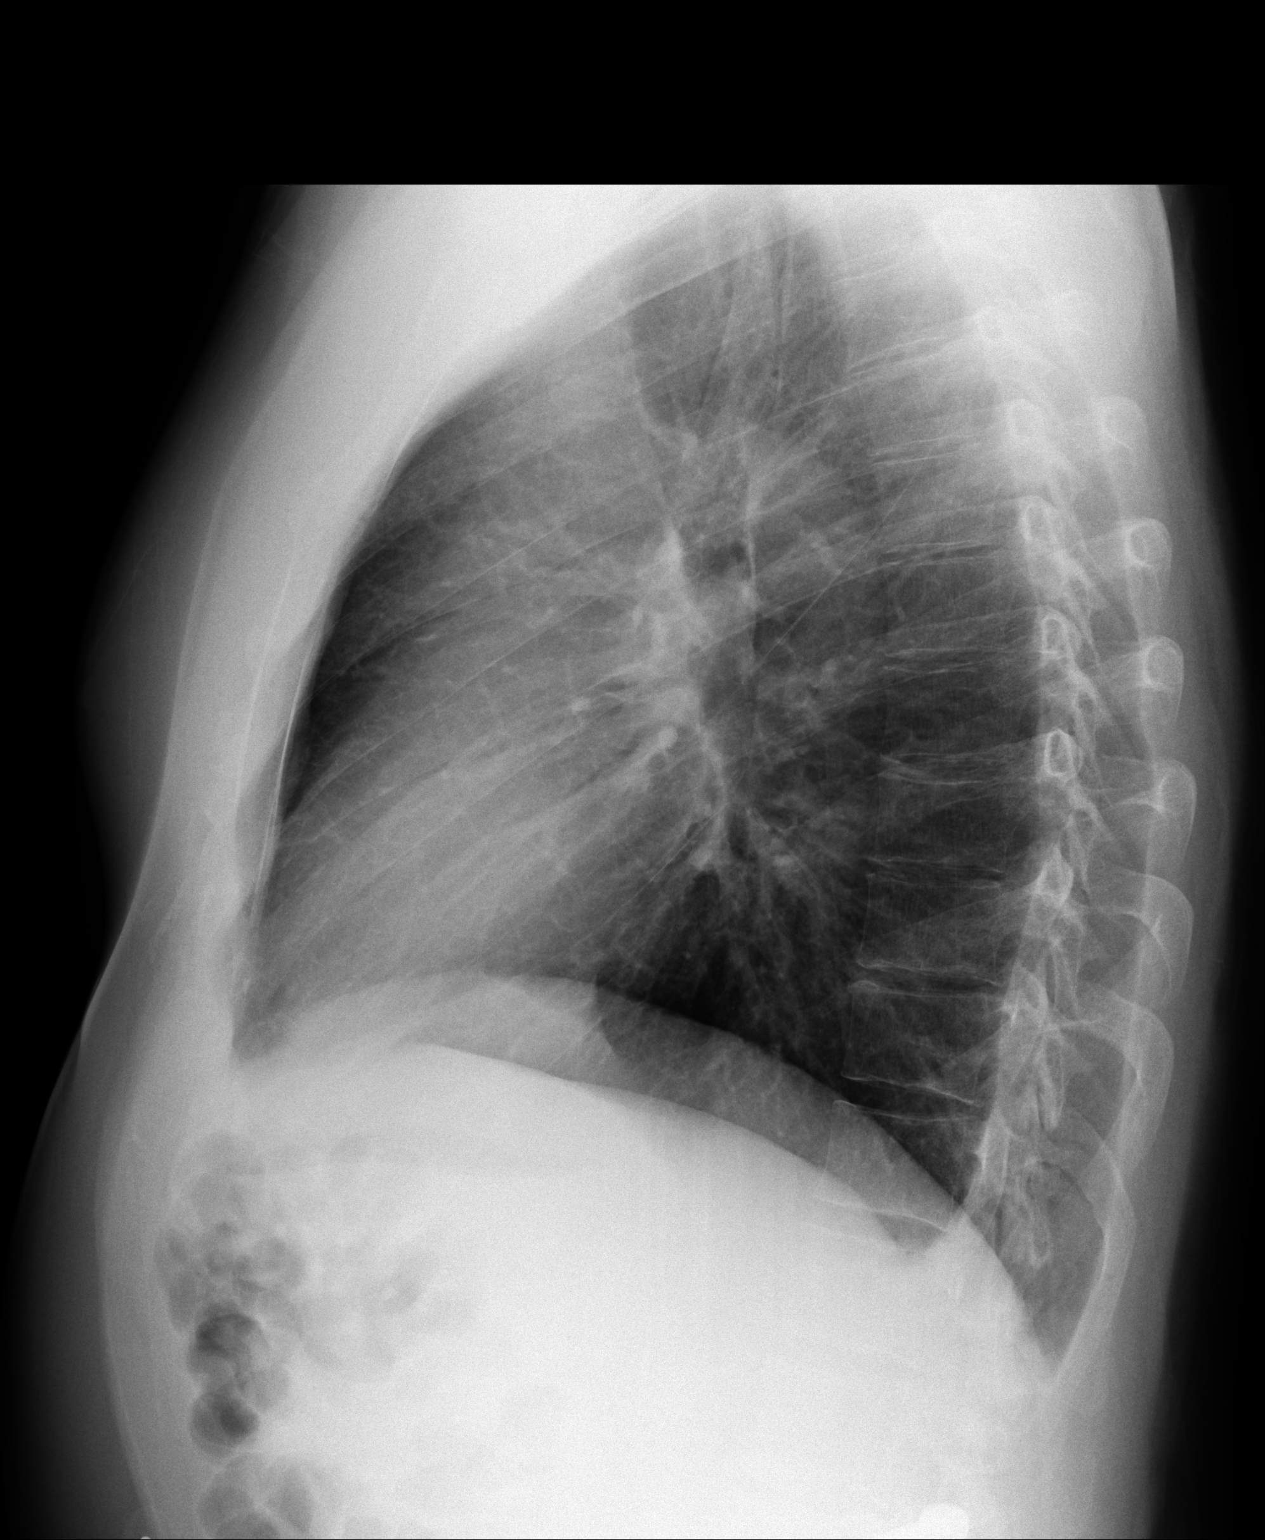
[im 3/3]
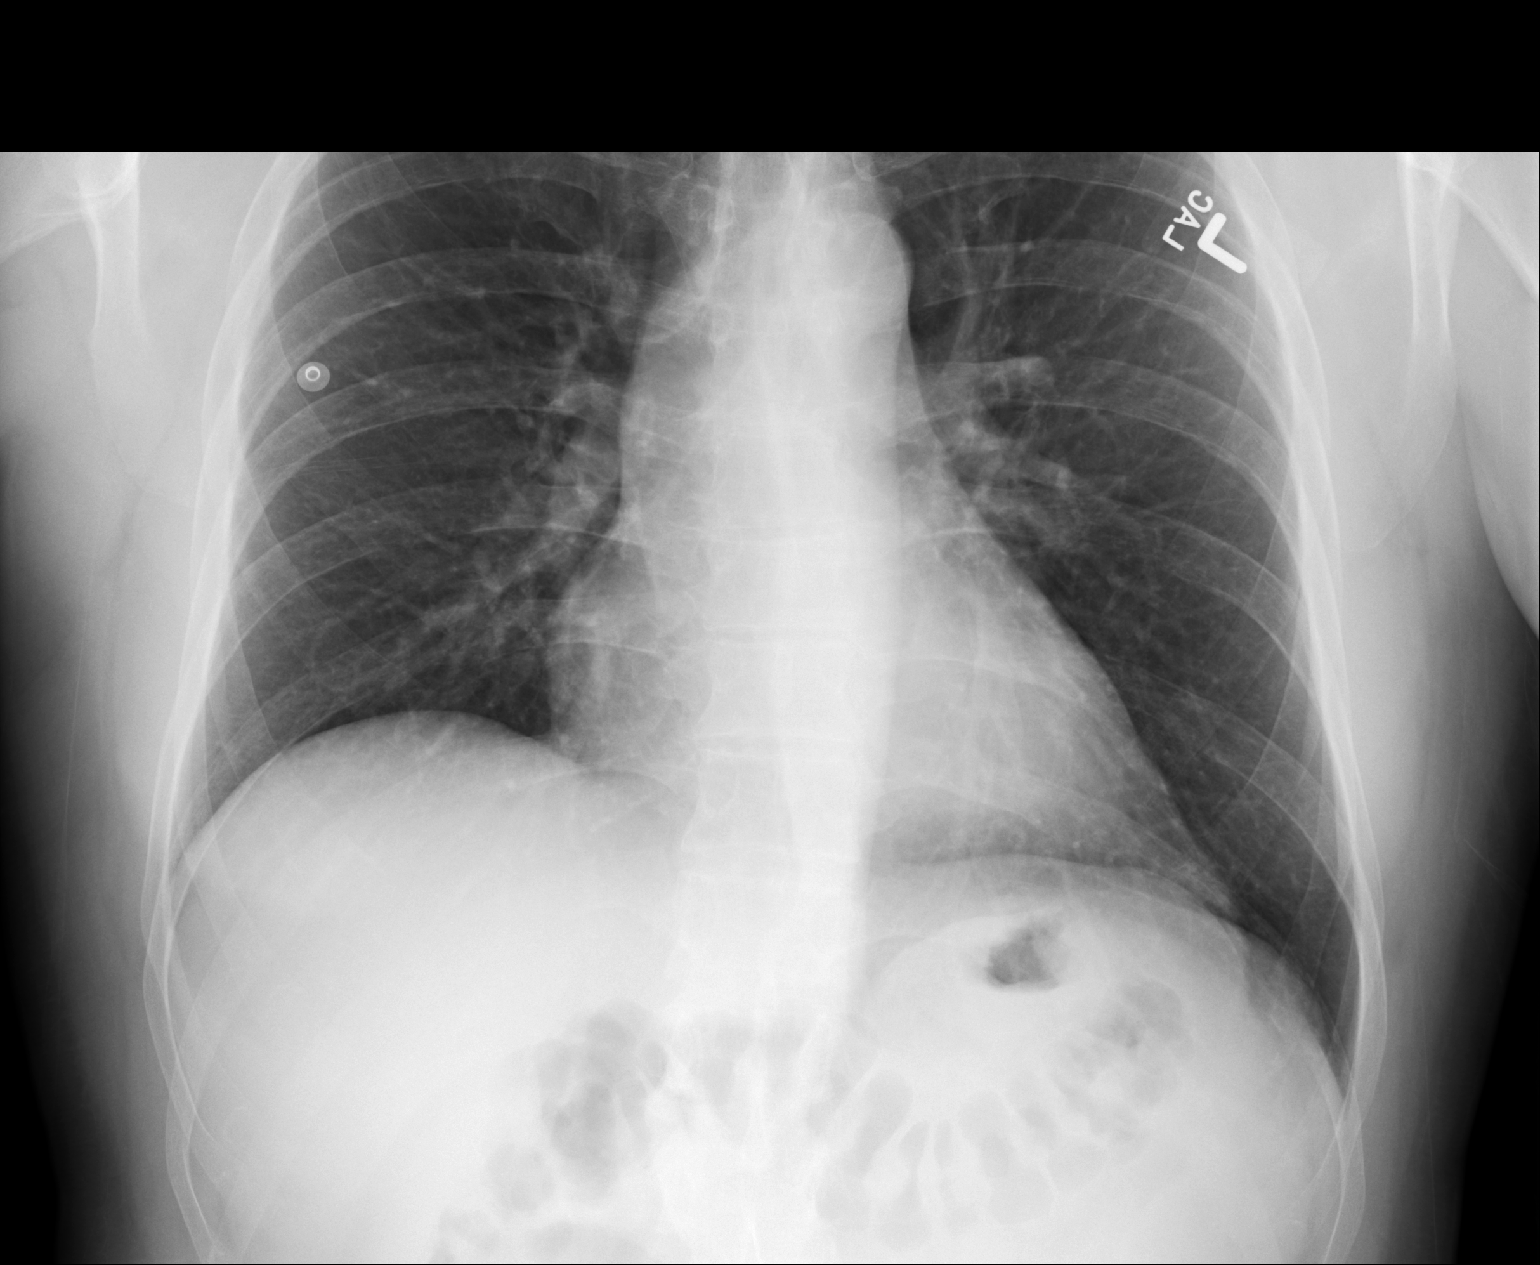

[3 of 3 positions shown; findings below may reference images not displayed]

PROCEDURE:     DXR - DXR CHEST PA (OR AP) AND LATERAL  - October 27, 2011  [DATE]

RESULT:     Comparison is made to the study 14 August, 2008.

The lungs are adequately inflated. There is no focal infiltrate. The cardiac
silhouette is normal in size. The pulmonary vascularity is not engorged.
There is no pleural effusion. The mediastinum is normal in width. The
observed portions of the bony thorax exhibit no acute abnormalities.
IMPRESSION: There is no evidence of acute cardiopulmonary abnormality.

[REDACTED]

## 2014-01-25 IMAGING — CT CT HEAD WITHOUT CONTRAST
2 series · 16 of 30 positions shown, 20 images · non-contrast
Comparison: none

REASON FOR EXAM: r sided numbness
COMMENTS:

PROCEDURE:     CT  - CT HEAD WITHOUT CONTRAST  - October 28, 2011  [DATE]
RESULT:     Comparison:  None
TECHNIQUE: Multiple axial images from the foramen magnum to the vertex were
obtained without IV contrast.

[Series 2: without · axial · non-contrast · 0.42mm/px · z∈[+416,+536]mm · 13 of 30 slices shown, 17 images]
[im 3/30  brain]
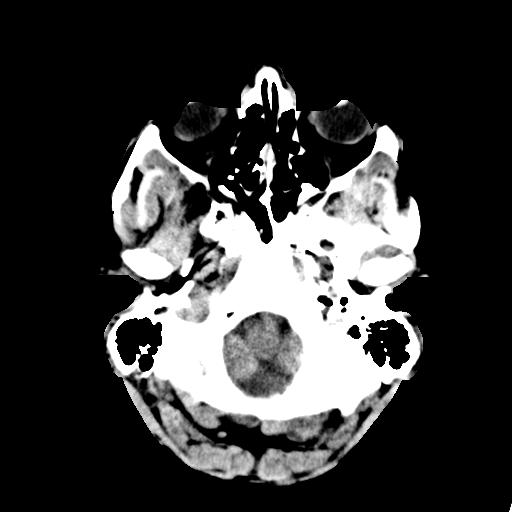
[im 3/30  bone]
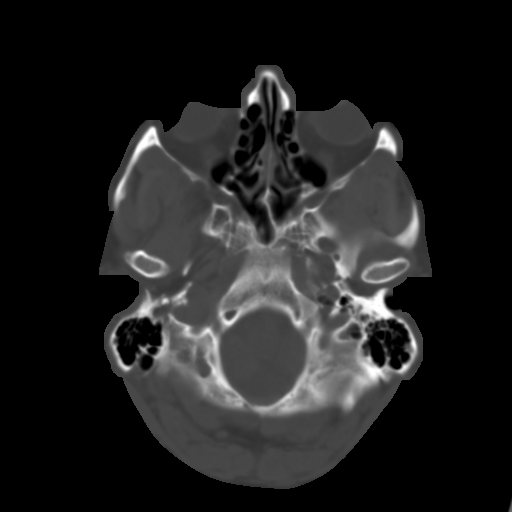
[im 5/30  brain]
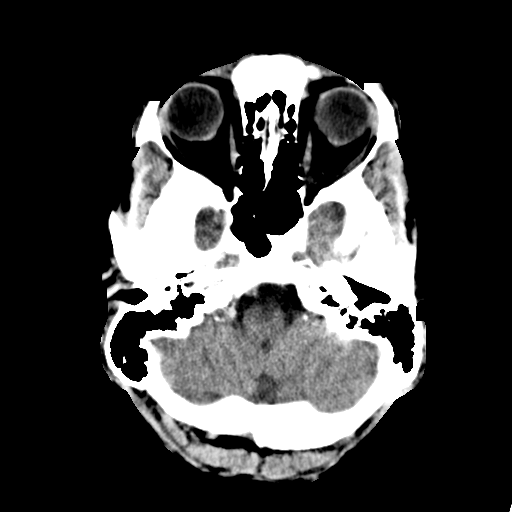
[im 7/30  brain]
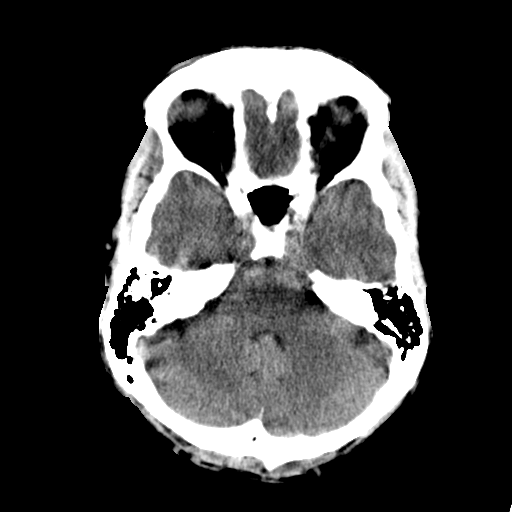
[im 9/30  brain]
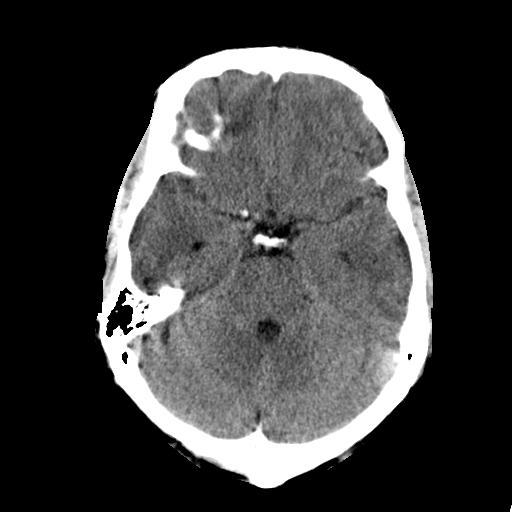
[im 11/30  brain]
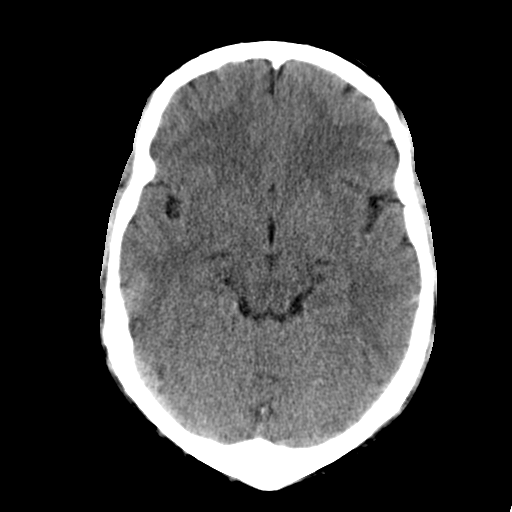
[im 11/30  bone]
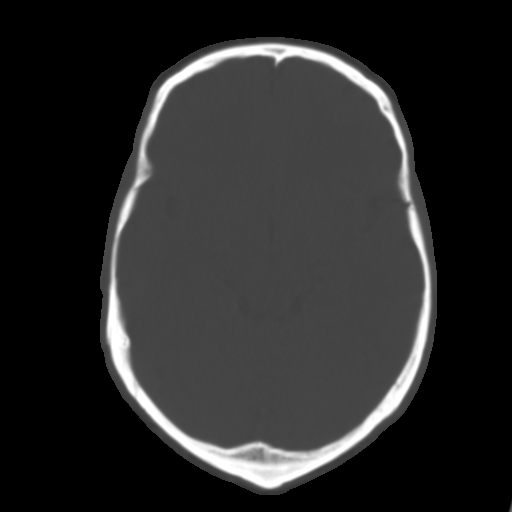
[im 13/30  brain]
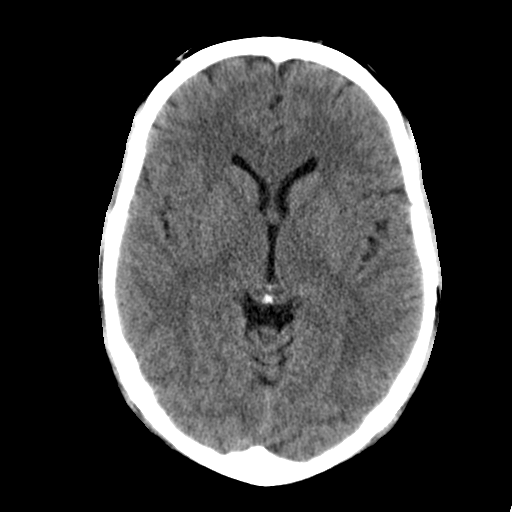
[im 15/30  brain]
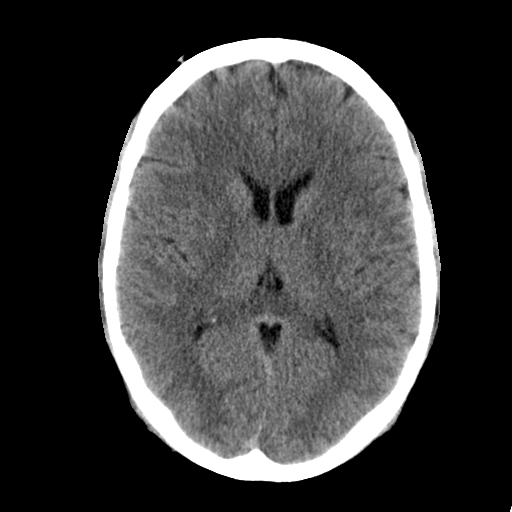
[im 17/30  brain]
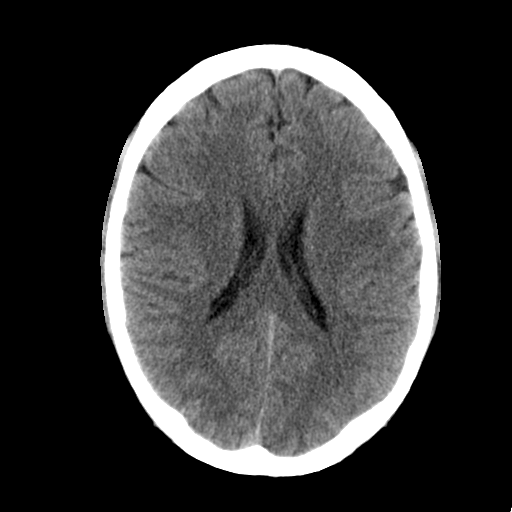
[im 19/30  brain]
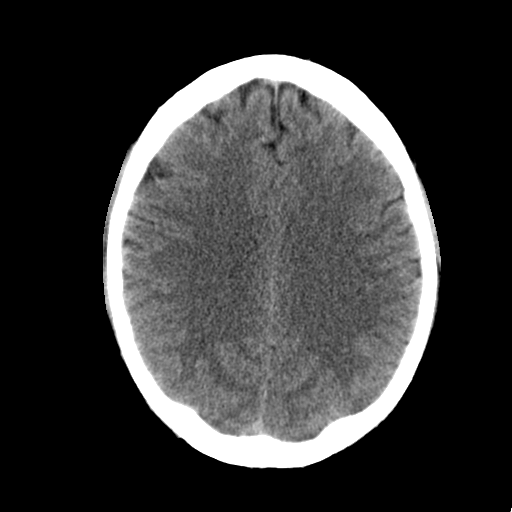
[im 19/30  bone]
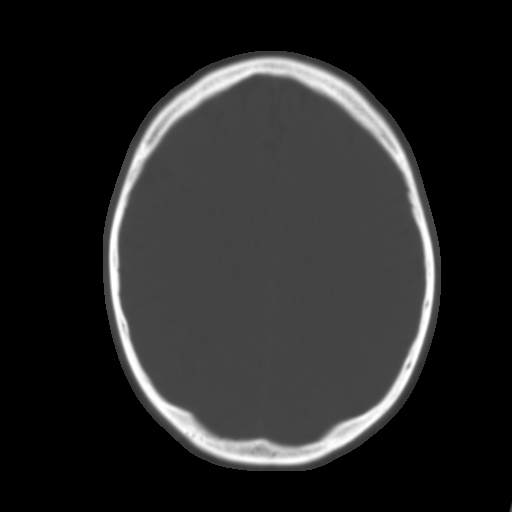
[im 21/30  brain]
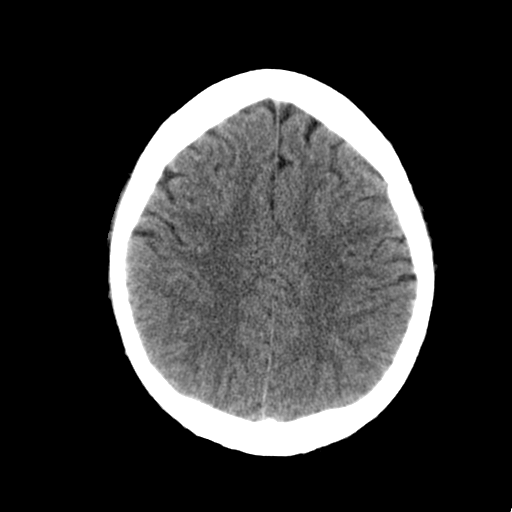
[im 23/30  brain]
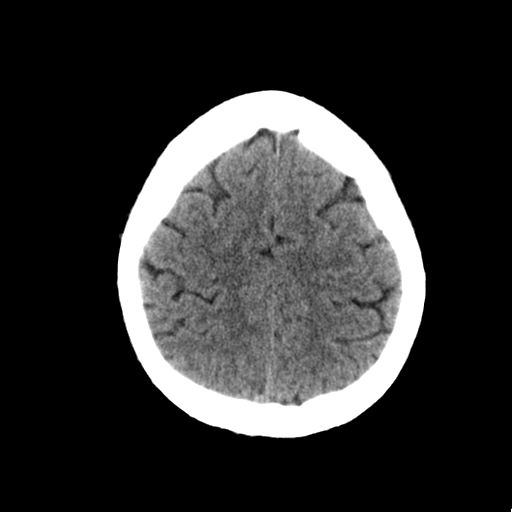
[im 25/30  brain]
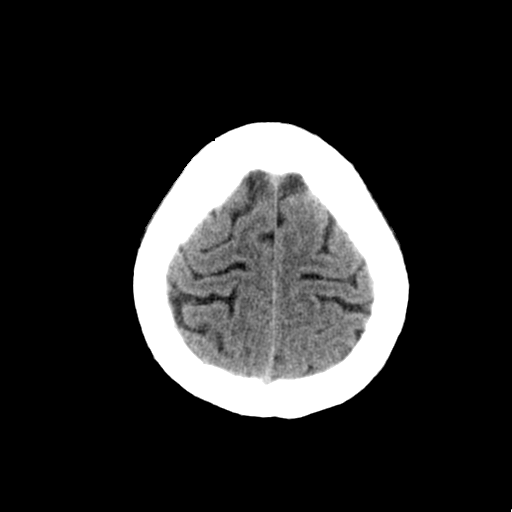
[im 27/30  brain]
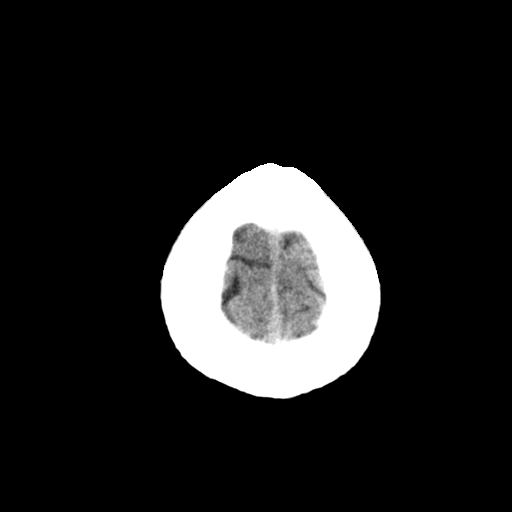
[im 27/30  bone]
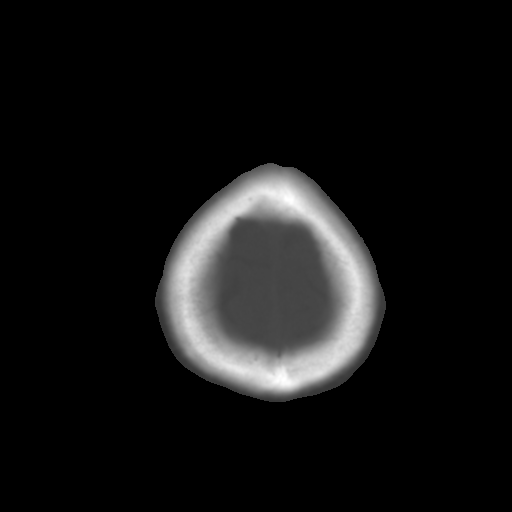

[Series 3: bone · axial · 0.42mm/px · z∈[+416,+456]mm · 3 of 30 slices shown]
[im 3/30  bone]
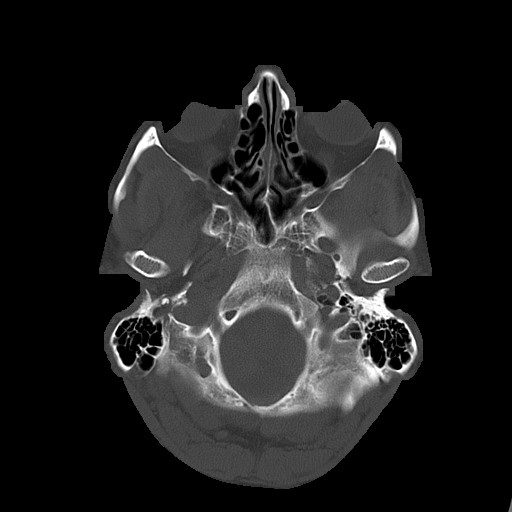
[im 7/30  bone]
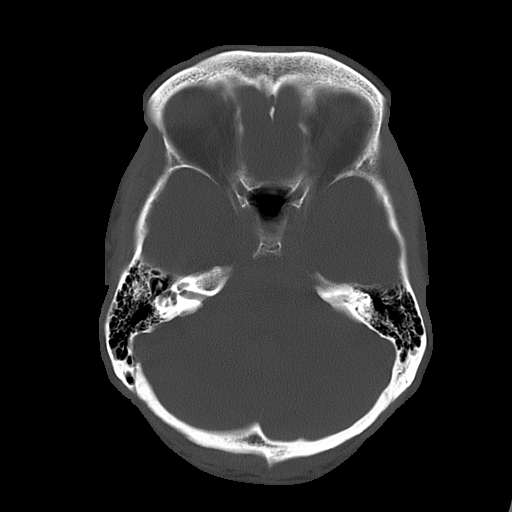
[im 11/30  bone]
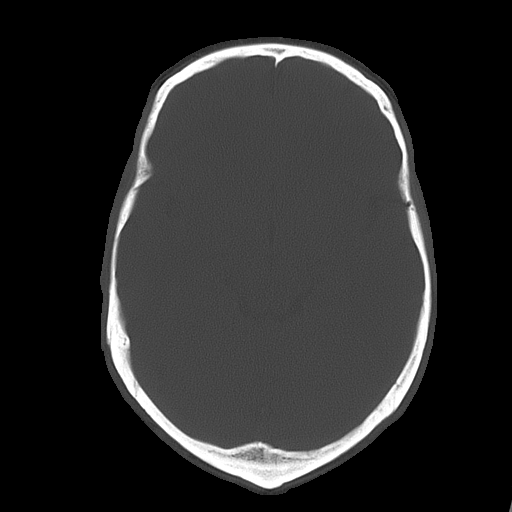

[16 of 30 positions shown; findings below may reference images not displayed]

FINDINGS: There is no evidence of mass effect, midline shift, or extra-axial fluid
collections.  There is no evidence of a space-occupying lesion or
intracranial hemorrhage. There is no evidence of a cortical-based area of
acute infarction.

The ventricles and sulci are appropriate for the patient's age. The basal
cisterns are patent.

Visualized portions of the orbits are unremarkable. The visualized portions
of the paranasal sinuses and mastoid air cells are unremarkable.

The osseous structures are unremarkable.
IMPRESSION: No acute intracranial process.

[REDACTED]

## 2014-01-25 IMAGING — US ABDOMEN ULTRASOUND LIMITED
1 series · 13 of 25 positions shown · non-contrast
Comparison: none

REASON FOR EXAM: pancreatitis, r/o gall stones
COMMENTS:   Body Site: GB and Fossa, CBD, Head of Pancreas

[Series 1: abdomen ultrasound limited · 0.31mm/px · 13 of 64 slices shown]
[im 1/64]
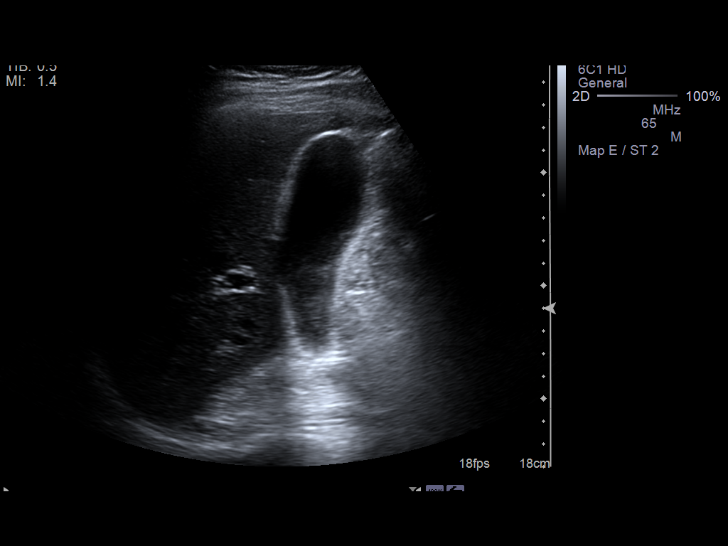
[im 6/64]
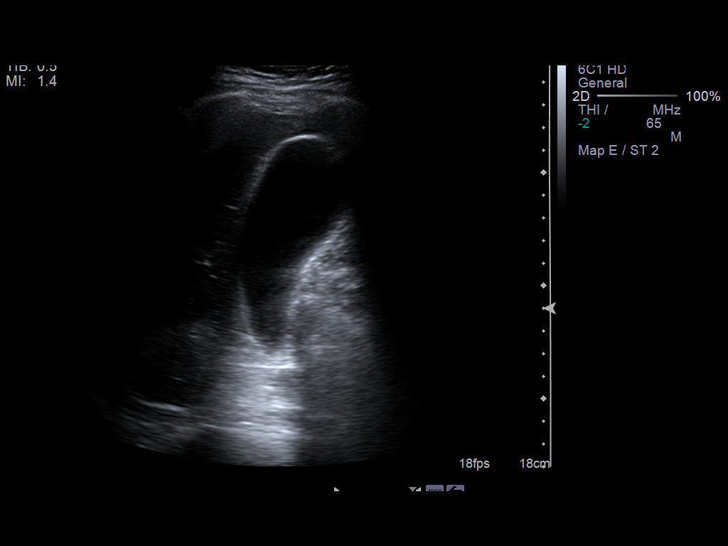
[im 11/64]
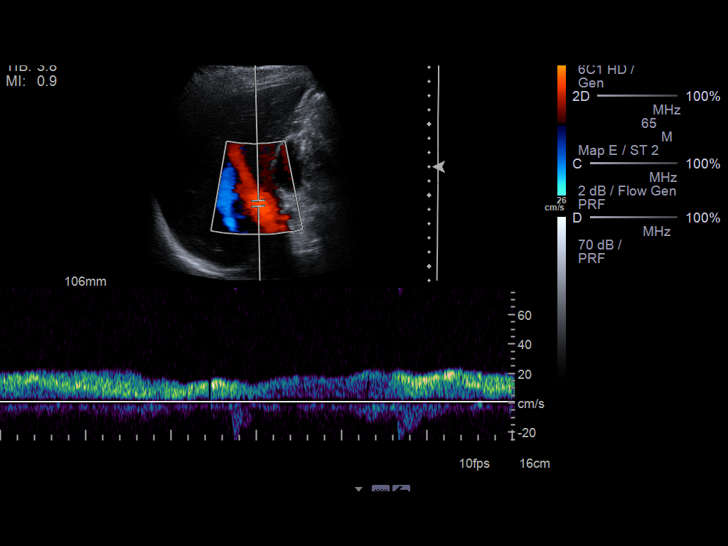
[im 16/64]
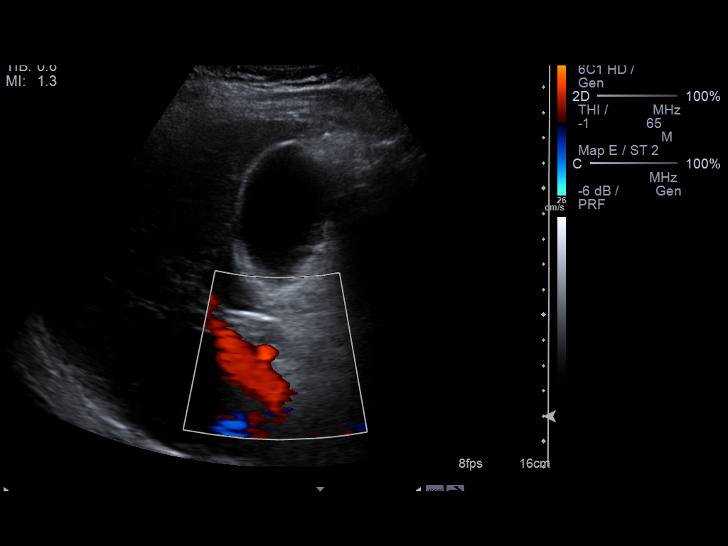
[im 22/64]
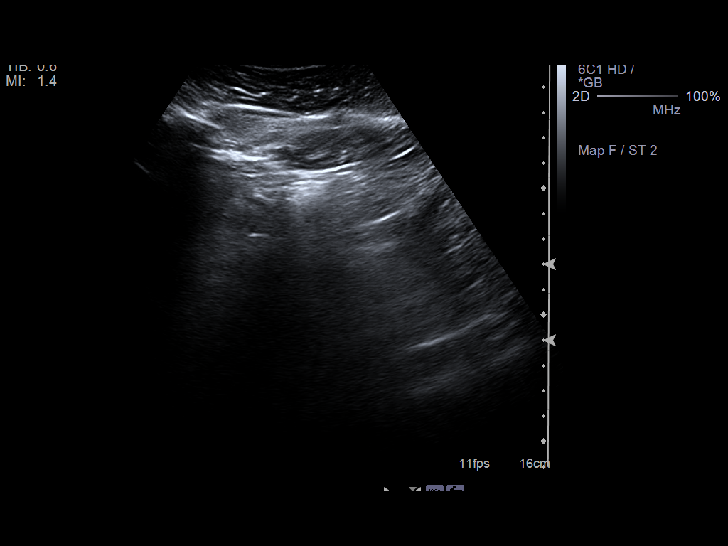
[im 27/64]
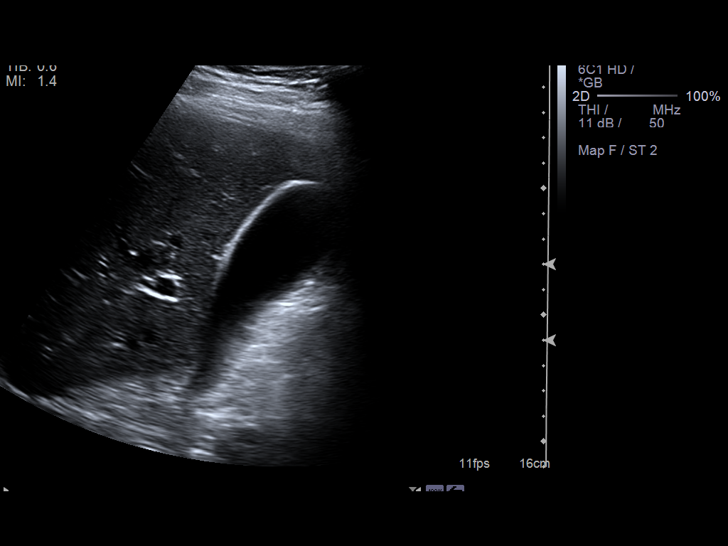
[im 32/64]
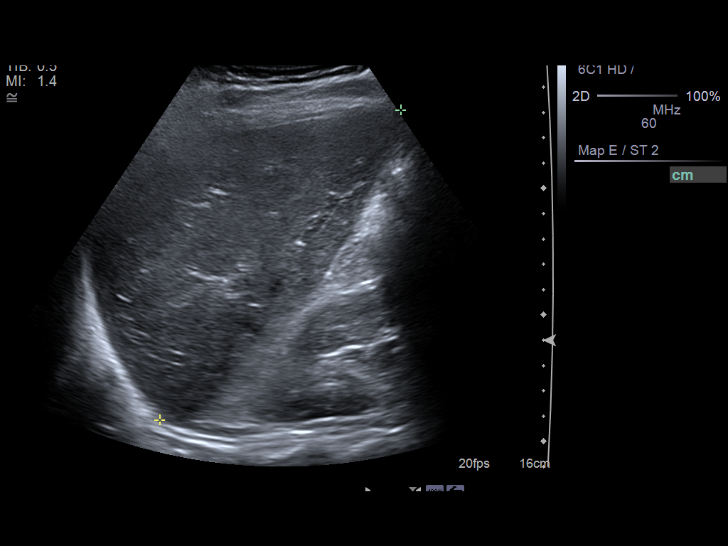
[im 37/64]
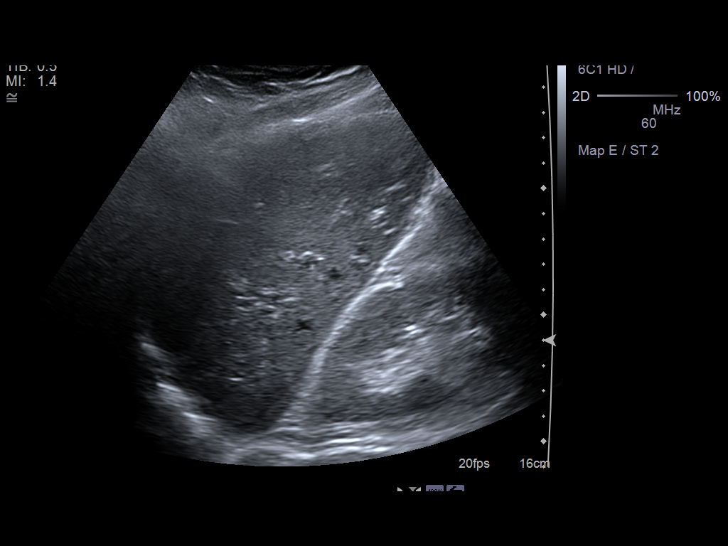
[im 43/64]
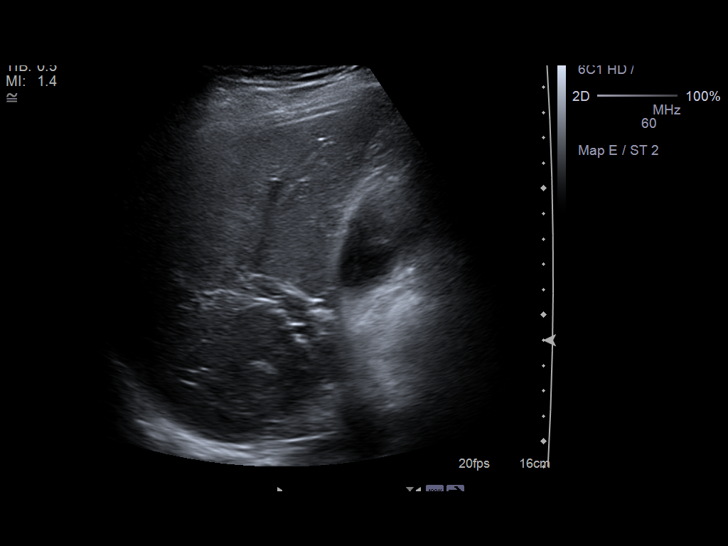
[im 48/64]
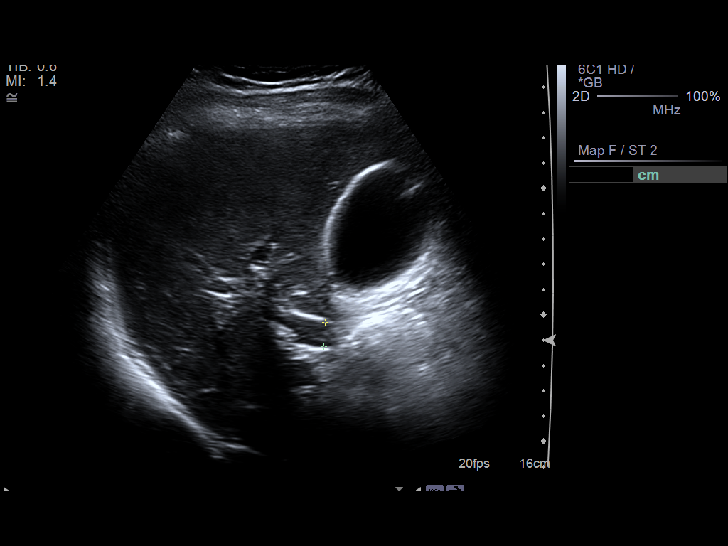
[im 53/64]
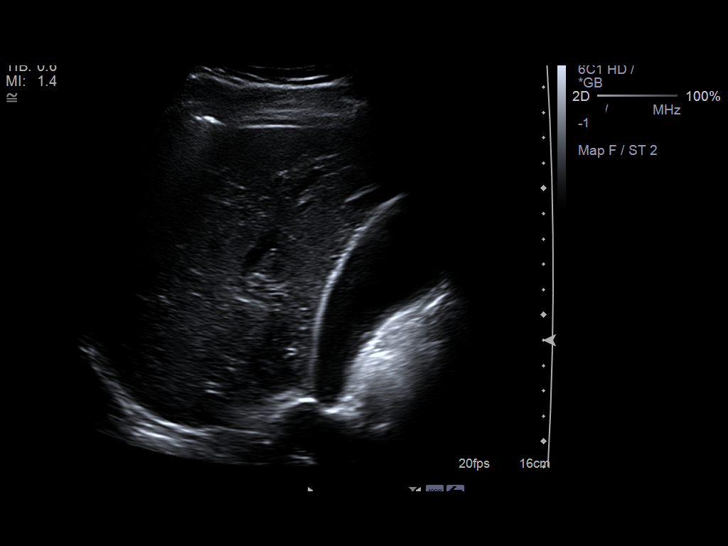
[im 58/64]
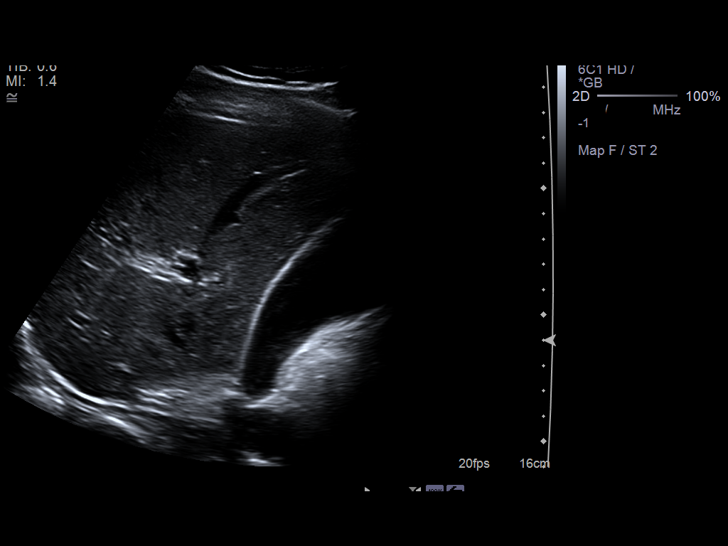
[im 64/64]
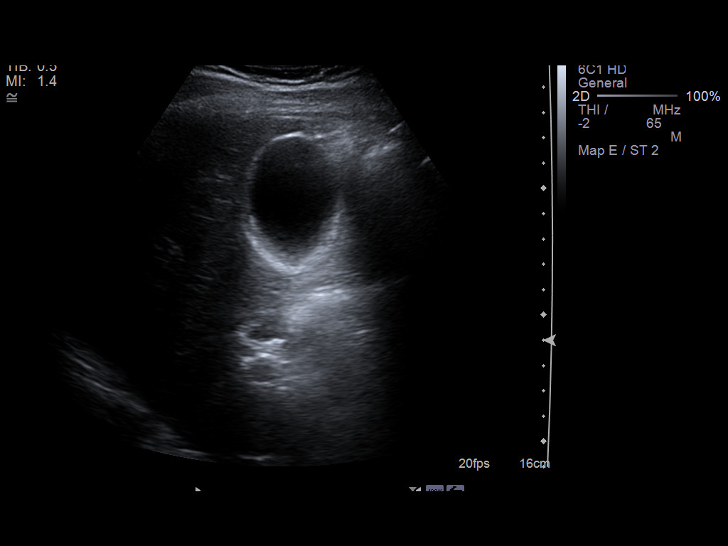

[13 of 25 positions shown; findings below may reference images not displayed]

PROCEDURE:     US  - US ABDOMEN LIMITED SURVEY  - October 28, 2011 [DATE]

RESULT:     A limited right upper quadrant ultrasound was performed.

The gallbladder is adequately distended and contains echogenic layering
material compatible with sludge. No stones are demonstrated. The gallbladder
wall measures 3 mm in thickness. There is no pericholecystic fluid. A
sonographic Murphy sign could not be assessed due to the patient's medicated
status. The common bile duct is dilated at 10.1 mm in diameter. Some
internal echoes within the common bile duct are visible. There is mild
intrahepatic ductal dilation. No focal hepatic mass is demonstrated. Portal
venous flow is normal in direction toward the liver. Pancreas is obscured by
bowel gas.
IMPRESSION: 1. There is sludge within the gallbladder. A sonographic Murphy's sign could
not be adequately assessed due to the patient's medicated status. There is
no significant gallbladder wall thickening or pericholecystic fluid.
2. The common bile duct is dilated to 10.1 mm and does contain a few
internal echoes.
3. There is mild intrahepatic ductal dilation.
4. The pancreas is obscured by bowel gas.

Further evaluation of the abdomen with CT scanning would be useful.

[REDACTED]

## 2014-03-14 ENCOUNTER — Encounter (INDEPENDENT_AMBULATORY_CARE_PROVIDER_SITE_OTHER): Payer: Self-pay

## 2014-03-14 ENCOUNTER — Encounter: Payer: Self-pay | Admitting: Internal Medicine

## 2014-03-14 ENCOUNTER — Ambulatory Visit (INDEPENDENT_AMBULATORY_CARE_PROVIDER_SITE_OTHER): Payer: PPO | Admitting: Internal Medicine

## 2014-03-14 VITALS — BP 111/74 | HR 96 | Temp 98.2°F | Ht 73.0 in | Wt 235.0 lb

## 2014-03-14 DIAGNOSIS — E7800 Pure hypercholesterolemia, unspecified: Secondary | ICD-10-CM

## 2014-03-14 DIAGNOSIS — R000 Tachycardia, unspecified: Secondary | ICD-10-CM

## 2014-03-14 DIAGNOSIS — M549 Dorsalgia, unspecified: Secondary | ICD-10-CM

## 2014-03-14 DIAGNOSIS — G8929 Other chronic pain: Secondary | ICD-10-CM

## 2014-03-14 DIAGNOSIS — D649 Anemia, unspecified: Secondary | ICD-10-CM

## 2014-03-14 DIAGNOSIS — E871 Hypo-osmolality and hyponatremia: Secondary | ICD-10-CM

## 2014-03-14 DIAGNOSIS — R5383 Other fatigue: Secondary | ICD-10-CM

## 2014-03-14 DIAGNOSIS — I471 Supraventricular tachycardia: Secondary | ICD-10-CM

## 2014-03-14 LAB — CBC WITH DIFFERENTIAL/PLATELET
BASOS PCT: 1 % (ref 0–1)
Basophils Absolute: 0.1 10*3/uL (ref 0.0–0.1)
EOS ABS: 0.2 10*3/uL (ref 0.0–0.7)
Eosinophils Relative: 3 % (ref 0–5)
HEMATOCRIT: 45.8 % (ref 39.0–52.0)
HEMOGLOBIN: 16.2 g/dL (ref 13.0–17.0)
Lymphocytes Relative: 29 % (ref 12–46)
Lymphs Abs: 2.4 10*3/uL (ref 0.7–4.0)
MCH: 32 pg (ref 26.0–34.0)
MCHC: 35.4 g/dL (ref 30.0–36.0)
MCV: 90.5 fL (ref 78.0–100.0)
MPV: 9.7 fL (ref 8.6–12.4)
Monocytes Absolute: 0.8 10*3/uL (ref 0.1–1.0)
Monocytes Relative: 10 % (ref 3–12)
NEUTROS ABS: 4.7 10*3/uL (ref 1.7–7.7)
NEUTROS PCT: 57 % (ref 43–77)
Platelets: 310 10*3/uL (ref 150–400)
RBC: 5.06 MIL/uL (ref 4.22–5.81)
RDW: 13.6 % (ref 11.5–15.5)
WBC: 8.3 10*3/uL (ref 4.0–10.5)

## 2014-03-14 NOTE — Progress Notes (Signed)
Pre visit review using our clinic review tool, if applicable. No additional management support is needed unless otherwise documented below in the visit note. 

## 2014-03-14 NOTE — Progress Notes (Signed)
Patient ID: [name], male   DOB: 12-08-1960, 54 y.o.   MRN: [id number]   Subjective:    Patient ID: [name], male    DOB: [birth year], 54 y.o.   MRN: [id number]  HPI  Patient here for a scheduled follow up.  Recently saw cardiology.  Had pharmacologic stress test.  Negative for ischemia.  Was placed on metoprolol.  Dose increased.  Did not tolerate.  States has caused him to gain weight.  Has stopped the metoprolol.  Has been off now for a few weeks.  Stomach doing better.  No recent flares of pancreatitis.  States has done better with this since stopping the steroid injections.  Is seen at pain clinic once every two weeks.  Receiving Lidocaine injections.  He has started going to the [facility]  Is swimming.  Feels better off metoprolol.  Wants to remain off.  States heart rate is averaging in the 90s.     Past Medical History  Diagnosis Date  . Hypercholesterolemia   . Pancreatitis   . Chronic back pain     s/p 5 back surgeries  . Mild cognitive impairment with memory loss   . Anoxic brain injury   . Depression   . Allergy   . H/O acute pancreatitis   . Coronary artery disease     Cardiac catheterization in 2007 at [facility] showed 30% stenosis in proximal LAD. No other disease. Ejection fraction was 65%    Current Outpatient Prescriptions on File Prior to Visit  Medication Sig Dispense Refill  . cetirizine (ZYRTEC) 10 MG tablet Take 10 mg by mouth daily.    . diazepam (VALIUM) 5 MG tablet Take 5-10 mg by mouth every 8 (eight) hours as needed (pain).     . fluticasone (FLONASE) 50 MCG/ACT nasal spray Place 2 sprays into both nostrils daily. 48 g 1  . tiZANidine (ZANAFLEX) 4 MG tablet Take 4 mg by mouth 3 (three) times daily.    . traZODone (DESYREL) 100 MG tablet Take 100 mg by mouth at bedtime as needed.     . zolpidem (AMBIEN) 10 MG tablet Take 10 mg by mouth at bedtime.      No current facility-administered medications on file prior to visit.    Review of Systems    Constitutional: Negative for appetite change and unexpected weight change.  HENT: Negative for congestion and sinus pressure.   Eyes: Negative for pain and discharge.  Respiratory: Negative for cough, chest tightness and shortness of breath.   Cardiovascular: Negative for chest pain, palpitations and leg swelling.  Gastrointestinal: Negative for nausea, vomiting, abdominal pain and diarrhea.  Musculoskeletal: Positive for back pain (persistent.  followed at the pain clinic.  ). Negative for joint swelling.  Skin: Negative for color change and rash.  Neurological: Negative for dizziness, light-headedness and headaches.       Objective:    Physical Exam  Constitutional: He appears well-developed and well-nourished. No distress.  HENT:  Nose: Nose normal.  Mouth/Throat: Oropharynx is clear and moist.  Neck: Neck supple. No thyromegaly present.  Cardiovascular: Normal rate and regular rhythm.   Pulmonary/Chest: Effort normal and breath sounds normal. No respiratory distress.  Abdominal: Soft. Bowel sounds are normal. There is no tenderness.  Musculoskeletal: He exhibits no edema.  Lymphadenopathy:    He has no cervical adenopathy.  Skin: No rash noted. No erythema.    BP 111/74 mmHg  Pulse 96  Temp(Src) 98.2 F (36.8 C) (Oral)  Ht 6'  1" (1.854 m)  Wt 235 lb (106.595 kg)  BMI 31.01 kg/m2  SpO2 95% Wt Readings from Last 3 Encounters:  03/14/14 235 lb (106.595 kg)  09/27/13 222 lb (100.699 kg)  06/27/13 214 lb 4 oz (97.183 kg)     Lab Results  Component Value Date   WBC 8.3 03/14/2014   HGB 16.2 03/14/2014   HCT 45.8 03/14/2014   PLT 310 03/14/2014   GLUCOSE 91 03/14/2014   CHOL 176 03/14/2014   TRIG 181* 03/14/2014   HDL 41 03/14/2014   LDLCALC 99 03/14/2014   ALT 30 03/14/2014   AST 32 03/14/2014   NA 136 03/14/2014   K 5.0 03/14/2014   CL 101 03/14/2014   CREATININE 0.99 03/14/2014   BUN 13 03/14/2014   CO2 23 03/14/2014   TSH 1.184 03/14/2014   PSA 0.81  06/03/2013   HGBA1C 5.3 09/14/2012        Assessment & Plan:   Problem List Items Addressed This Visit    Anemia    Recheck cbc to confirm wnl.       Relevant Orders   CBC w/Diff (Completed)   Comp Met (CMET) (Completed)   Lipid panel (Completed)   TSH (Completed)   Chronic back pain    Persistent pain.  Being followed at pain clinic.  Receiving lidocaine injections.  Discussed importance of not mixing his medications and side effects of his medications.  Avoid alcohol.  States he does not drink.        Relevant Medications   naproxen sodium (ANAPROX) 220 MG tablet   Hypercholesteremia - Primary    Low cholesterol diet and exercise.  Follow lipid panel.        Relevant Orders   CBC w/Diff (Completed)   Comp Met (CMET) (Completed)   Lipid panel (Completed)   TSH (Completed)   Hyponatremia    Recheck sodium today.        Relevant Orders   CBC w/Diff (Completed)   Comp Met (CMET) (Completed)   Lipid panel (Completed)   TSH (Completed)   Sinus tachycardia    Saw cardiology.  Had pharmacological stress test - negative for ischemia.  Did not tolerate metoprolol.  Off now.  Feels better.  Wants to remain off.  Follow.         Other Visit Diagnoses    Other fatigue        Relevant Orders    CBC w/Diff (Completed)    Comp Met (CMET) (Completed)    Lipid panel (Completed)    TSH (Completed)        Einar Pheasant, MD

## 2014-03-15 LAB — COMPREHENSIVE METABOLIC PANEL
ALK PHOS: 112 U/L (ref 39–117)
ALT: 30 U/L (ref 0–53)
AST: 32 U/L (ref 0–37)
Albumin: 4.5 g/dL (ref 3.5–5.2)
BUN: 13 mg/dL (ref 6–23)
CALCIUM: 9.5 mg/dL (ref 8.4–10.5)
CHLORIDE: 101 meq/L (ref 96–112)
CO2: 23 mEq/L (ref 19–32)
Creat: 0.99 mg/dL (ref 0.50–1.35)
Glucose, Bld: 91 mg/dL (ref 70–99)
POTASSIUM: 5 meq/L (ref 3.5–5.3)
SODIUM: 136 meq/L (ref 135–145)
Total Bilirubin: 0.6 mg/dL (ref 0.2–1.2)
Total Protein: 7.6 g/dL (ref 6.0–8.3)

## 2014-03-15 LAB — LIPID PANEL
Cholesterol: 176 mg/dL (ref 0–200)
HDL: 41 mg/dL (ref >39)
LDL Cholesterol: 99 mg/dL (ref 0–99)
Total CHOL/HDL Ratio: 4.3 Ratio
Triglycerides: 181 mg/dL — ABNORMAL HIGH (ref <150)
VLDL: 36 mg/dL (ref 0–40)

## 2014-03-15 LAB — TSH: TSH: 1.184 u[IU]/mL (ref 0.350–4.500)

## 2014-03-17 ENCOUNTER — Encounter: Payer: Self-pay | Admitting: Internal Medicine

## 2014-03-17 ENCOUNTER — Encounter: Payer: Self-pay | Admitting: *Deleted

## 2014-03-17 NOTE — Assessment & Plan Note (Signed)
Low cholesterol diet and exercise.  Follow lipid panel.   

## 2014-03-17 NOTE — Assessment & Plan Note (Signed)
Recheck cbc to confirm wnl.  

## 2014-03-17 NOTE — Assessment & Plan Note (Signed)
Persistent pain.  Being followed at pain clinic.  Receiving lidocaine injections.  Discussed importance of not mixing his medications and side effects of his medications.  Avoid alcohol.  States he does not drink.

## 2014-03-17 NOTE — Assessment & Plan Note (Signed)
Recheck sodium today.  

## 2014-03-17 NOTE — Assessment & Plan Note (Signed)
Saw cardiology.  Had pharmacological stress test - negative for ischemia.  Did not tolerate metoprolol.  Off now.  Feels better.  Wants to remain off.  Follow.

## 2014-03-28 ENCOUNTER — Ambulatory Visit: Payer: Self-pay | Admitting: Cardiovascular Disease

## 2014-05-20 NOTE — Discharge Summary (Signed)
PATIENT NAMENOUR, [name] MR#:  [id number] DATE OF BIRTH:  06-18-1960  DATE OF ADMISSION:  10/27/2011 DATE OF DISCHARGE:  11/03/2011  ADMITTING DIAGNOSIS: Abdominal pain.   DISCHARGE DIAGNOSES:  1. Abdominal pain due to acute recurrent pancreatitis status post laparoscopic cholecystectomy due to recurrent pancreatitis.  2. Chronic back pain.  3. Chronic constipation.  4. Attention deficit disorder.  5. Hyperlipidemia.  6. History of exposure to hepatitis B.  7. Epistaxis.  8. Depression and sleep disturbance.  9. Allergic rhinitis.  10. Temporomandibular joint disorder.  11. Coronary artery disease with 30% blockage noted in 2007.  12. 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  13. L4-L5 fusion in April 2005.  14. L4-L5 rupture status post surgery 08/2003.  15. Status post septoplasty.  16. Status post sinus surgery.  17. Subsequent reversal of spinous fusion due to disk bulging.  18. Nerve impingement L2-L5.   LABORATORY, DIAGNOSTIC AND RADIOLOGICAL DATA: Troponin less than 0.02, magnesium 2.0, glucose 81, BUN 9, creatinine 0.19, sodium 144, potassium 3.5, chloride 109, CO2 26. LFTs were normal. Lipase 1022, WBC 8.0, hemoglobin 14.5, platelet count 260. TUDS were positive for opioids and benzodiazepines. Urinalysis was nitrite negative, leukocytes negative. PA and lateral chest x-ray showed no acute cardiopulmonary processes. Ultrasound of the abdomen done on the 27th showed sludge within the gallbladder. Sonographic Murphy's sign was negative. Common bile duct was dilated at 10.1 cm. Mild intrahepatic ductal dilation. M.R.C.P. showed no cholelithiasis or choledocholithiasis. No intrahepatic biliary dilation. CT scan of the head showed no acute abnormality. Intraoperative cholangiogram showed opacification of the common bile duct without a filling defect.   HOSPITAL COURSE: Please refer to history and physical done by the admitting physician.  Patient is a [name] who presented to the ED with complaint of abdominal pain. He has history of recurrent pancreatitis. Patient has had ERCP in the past and has had a pancreatic sphincterectomy as well as pancreatic duct stent placed in the past. Patient has been followed at [place] for multiple episodes of pancreatitis. Patient presented again here with complaint of abdominal pain. Initially his ultrasound did show some sludge in the gallbladder. He was kept n.p.o. and supportive care. He continued to complain of abdominal pain. He was seen in consultation by GI who recommended patient have elective laparoscopic cholecystectomy due to his recurrent pancreatitis. Patient underwent a lap cholecystectomy on 10/01 without any complications. Patient was monitored two days postop and he was tolerating his diet and his abdominal pain was mostly resolved. His lipase on 10/03 was 99. His LFTs were normal except a low albumin. He was seen by surgery for follow up. They deemed him stable for discharge. Patient is stable for discharge.   DISCHARGE MEDICATIONS:  1. Gabapentin 800, 1 tab p.o. t.i.d.  2. Trazodone 100, 1 tab p.o. at bedtime.  3. Zyrtec 10 daily.  4. Flomax 0.4 daily.  5. Ambien 10 mg at bedtime.  6. Zofran 4 mg q.6 p.r.n.  7. Tylenol 650 q.4 p.r.n.  8. Morphine extended release 60 mg 1 tab p.o. q.12.  9. Morphine immediate release 15 mg 1 tab p.o. q.6 p.r.n.   DIET: Low fat, low cholesterol, consistency regular.   ACTIVITY: As tolerated.   TIMEFRAME FOR FOLLOW UP: 1 to 2 weeks follow with Dr. Leanora Cover as per their recommendation.       TIME SPENT: 35 minutes.  ____________________________ Lafonda Mosses Posey Pronto, MD shp:cms D: 11/04/2011 10:24:25 ET  T: 11/04/2011 10:35:33 ET JOB#: [id number]  cc: Monterio Bob H. Posey Pronto, MD, <Dictator>  Alric Seton MD ELECTRONICALLY SIGNED 11/04/2011 13:14

## 2014-05-20 NOTE — Discharge Summary (Signed)
PATIENT NAMEJAHKI, [name] MR#:  [id number] DATE OF BIRTH:  1960-07-28  DATE OF ADMISSION:  11/08/2011 DATE OF DISCHARGE:  11/10/2011  DIAGNOSES:  1. Postoperative abdominal pain. 2. Elevated liver function tests. 3. History of chronic pancreatitis.   PROCEDURES: None.   HISTORY OF PRESENT ILLNESS/HOSPITAL COURSE: This is a patient who was readmitted to the hospital following a laparoscopic cholecystectomy with cholangiography for possible biliary pancreatitis. This was done by Dr. Rexene Edison approximately a week prior to admission. The patient had stopped taking his narcotics at home and developed abdominal pain and some nausea and vomiting. He was seen in the emergency room where a workup failed to show many abnormalities with the exception of a slightly elevated AST and a normal ALT.  Normal bilirubin with slightly elevated alkaline phosphatase. These were the only abnormal laboratory values noted on examination and admission. The following day his pain was better after hydration and IV narcotics. His liver function tests returned to near normal, and he was tolerating a regular diet. Currently he is discharged in stable condition with labs that are nearly normalized and a near complete resolution of his abdominal pain. I have asked him to follow up with Dr. Rexene Edison next week for followup. No new prescriptions were given as he has narcotics at home. He is instructed to return to his normal activities and to restart all of his home medications at home.     ____________________________ Jerrol Banana. Burt Knack, MD rec:vtd D: 11/10/2011 13:31:31 ET T: 11/11/2011 11:05:07 ET JOB#: [id number]  cc: Jerrol Banana. Burt Knack, MD, <Dictator> Florene Glen MD ELECTRONICALLY SIGNED 11/11/2011 17:56

## 2014-05-20 NOTE — Consult Note (Signed)
Brief Consult Note: Diagnosis: dilated common bile duct, biliary pancreatitis.   Patient was seen by consultant.   Recommend further assessment or treatment.   Orders entered.   Comments: Patient seen and examined please see full GI consult.  #[id number].  Patietn with history of chronic/recurrent pancreatitis.  Presenting on theis hospitalization with elevated lipas, clearing overnight, however US showing increased CBD dimension (5.7->10.4) compared with previous and evidence of possible choledocholithiasis/gall bladder sludge.  Impression biliary pancreatitis.  Will obtain MRCP and surgical opinion re CCY.  If there is evidence of debris/stones in the CBD, will need likely need ERCP as well. Following.  Electronic Signatures: Loistine Simas (MD)  (Signed 28-Sep-13 17:49)  Authored: Brief Consult Note   Last Updated: 28-Sep-13 17:49 by Loistine Simas (MD)

## 2014-05-20 NOTE — Consult Note (Signed)
PATIENT NAME:  [name] MR#:  [id number] DATE OF BIRTH:  11/12/1960  DATE OF CONSULTATION:  10/30/2011  REFERRING PHYSICIAN:  [facility] CONSULTING PHYSICIAN:  Rodena Goldmann III, MD PRIMARY CARE PHYSICIAN: Einar Pheasant, MD   CHIEF COMPLAINT: Abdominal pain.   BRIEF HISTORY: [name] is a 54 year old gentleman admitted to the hospital with what appears to be an episode of acute on chronic pancreatitis. He presented to the Emergency Room with abdominal discomfort and an elevated lipase at the time of his admission. Admission labs demonstrated a lipase of greater than 1000. His white blood cell count was normal. His liver function studies were otherwise unremarkable. He has a history of chronic pancreatitis and was admitted with a diagnosis of exacerbation of his chronic problems.   He has had multiple work-ups in the past, most recently in [place] less than a week ago. He carries the diagnosis of sphincter of Oddi dysfunction. He has had multiple ERCPs, upper endoscopies, HIDA scans and ultrasounds. He underwent an ERCP and sphincterotomy in the past which was complicated by duodenal perforation. He developed significant cholangitis and sepsis, was transferred to [facility] and spent a long hospitalization and recovery from that procedure. He is followed by the GI Service at [facility] in [place]. His issues with pain, back pain, nausea, and vomiting have been chronic and elusive to diagnosis. He was last seen in [facility] with a diagnosis of bile duct inflammation but discharged home for further follow up with his primary care doctor. He has been taking Percocet, MS Contin and diazepam for his back pain and has been off his medication for some time. He was admitted with exacerbation of his current symptoms. Work-up has been negative on multiple occasions in the past. The Surgical Service was consulted to consider possible surgical intervention.   DIAGNOSTIC AND RADIOLOGICAL DATA: An  ultrasound was performed on this admission which demonstrated enlargement of his common bile duct from a previous diameter of 5.7 to greater than 10 mm. There was a question of choledocholithiasis, although no gallbladder wall thickening was noted, no cholelithiasis was identified, and no evidence for gall bladder wall thickening,  pericholecystic fluid or inflammatory changes. He had MRCP performed this morning which did not demonstrate any evidence of gallstones, common bile duct stones or sludge in the common bile duct. There did not appear to be any evidence of bile duct dysfunction on that evaluation.   PAST MEDICAL/SURGICAL HISTORY:  1. Multiple previous back disorders and surgeries. 2. Sinus surgery, septoplasty.  3. He has no history of cardiac disease, hypertension, or diabetes.   CURRENT MEDICATIONS:  1. Flomax 0.4 mg p.o. daily.  2. Diazepam 10 mg, 5 times a day.  3. Percocet 7.5/325 mg t.i.d.  4. MS Contin 30 mg q.i.d.  5. Ambien at bedtime p.r.n.  6. Neurontin 300 mg, 3 tablets t.i.d.   MEDICATIONS: He takes Zofran intermittently for his persistent nausea.   SOCIAL HISTORY: He is currently disabled. He denies any history of alcohol or tobacco abuse.   REVIEW OF SYSTEMS: Review of systems is otherwise unremarkable.   PHYSICAL EXAMINATION:  GENERAL: He is alert, appears comfortable but complains of 10 out of 10 pain when questioned. His blood pressure is 158/80, oxygen saturation satisfactory. Heart rate 68 and regular. He appears to be comfortable and in no acute distress.   HEENT: Exam was unremarkable. He is nonicteric. No pupillary abnormalities. No facial deformities.   NECK: Supple without adenopathy. No complaints of  neck pain. His trachea is midline.   CHEST: Clear with no adventitious sounds. He has normal pulmonary excursion.   CARDIAC: No murmurs or gallops to my ear. He seems to be in normal sinus rhythm.   ABDOMEN: Soft with no guarding. He does have some mild  epigastric tenderness and mild left upper quadrant tenderness. No rebound is noted. He has active bowel sounds.   LOWER EXTREMITIES: Full range of motion. No deformities and good distal pulses.   PSYCHIATRIC: Normal orientation but he appears to be  a bit upset with the current situation. He is under the impression that surgery will solve his problem and was expecting scheduling of surgery urgently.   ASSESSMENT/PLAN:  I have reviewed the CT scans and lab and x-ray reports. This gentleman has chronic pancreatitis with possible acute exacerbation. I do not see any evidence in his work-up that suggests his gallbladder is responsible for his current symptoms. I think the dilation of his common bile duct may be due to chronic scarring from his previous complicated ERCP. There is no evidence at the present time that he has gallstones, bile duct stones, or bile duct sludge. I do not think surgery will solve his current pain issues. If indeed his problem is related to pancreatitis or a biliary source, we can eliminate the risk of having a future problem with bile duct seeding, but the chronic pancreatitis issues that he is currently dealing with will not be solved by gallbladder surgery. He may be a candidate to consider possible partial pancreatectomy or further evaluation of his pancreas to rule out the option of surgical intervention on his pancreas in a pain control situation, but at the present time I do not see indication for gallbladder surgery. He became upset when I discussed this plan with him saying that people were telling him different things, and he was upset that there was no continuity of care and that the plan did not appear to be consistent from one physician to another. I asked him if I was the first surgeon in this situation that had seen him, and he did agree that was the case. He said he was getting a consistent message from me. We will continue to follow him while he is hospitalized, but at the  present time I do not see any indication for surgical intervention.  ____________________________ Rodena Goldmann III, MD rle:cbb D: 10/30/2011 15:27:09 ET T: 10/30/2011 15:56:34 ET JOB#: [id number]  cc: Rodena Goldmann III, MD, <Dictator> Lollie Sails, MD Einar Pheasant, MD Rodena Goldmann MD ELECTRONICALLY SIGNED 10/30/2011 16:27

## 2014-05-20 NOTE — Consult Note (Signed)
Chief Complaint:   Subjective/Chief Complaint continues with epigastric and somewhat increased ruq pain.  no n/v.   VITAL SIGNS/ANCILLARY NOTES: **Vital Signs.:   01-Oct-13 13:00   Vital Signs Type Routine   Temperature Temperature (F) 98   Celsius 36.6   Temperature Source Oral   Pulse Pulse 66   Respirations Respirations 20   Systolic BP Systolic BP 757   Diastolic BP (mmHg) Diastolic BP (mmHg) 80   Mean BP 95   Pulse Ox % Pulse Ox % 95   Pulse Ox Activity Level  At rest   Oxygen Delivery Room Air/ 21 %   Brief Assessment:   Cardiac Regular    Respiratory clear BS    Gastrointestinal details normal Soft  Nondistended  No masses palpable  No rebound tenderness  positive bs, mild epigastric pain to palpation, ruq pain to palpation, moreso than epigastric.   Lab Results: Routine Chem:  30-Sep-13 12:43    Lipase 339 (Result(s) reported on 31 Oct 2011 at 01:08PM.)   Assessment/Plan:  Assessment/Plan:   Assessment 1) recurrent pancreatitis, likely of biliary origin, abd [place] c/w sludge in GB.    Plan 1) awaiting CCY noted as arranged for today, agree with same.  will follow through post po.   Electronic Signatures: Loistine Simas (MD)  (Signed 01-Oct-13 16:50)  Authored: Chief Complaint, VITAL SIGNS/ANCILLARY NOTES, Brief Assessment, Lab Results, Assessment/Plan   Last Updated: 01-Oct-13 16:50 by Loistine Simas (MD)

## 2014-05-20 NOTE — Consult Note (Signed)
Chief Complaint:   Subjective/Chief Complaint some increased epigastric pain this am, mild increase of lipase this am.  currently some better, continues with epigastric pain and nause.   VITAL SIGNS/ANCILLARY NOTES:  **Vital Signs.:   30-Sep-13 14:00   Vital Signs Type Routine   Temperature Temperature (F) 98.5   Celsius 36.9   Temperature Source oral   Pulse Pulse 92   Pulse source if not from Vital Sign Device radial   Respirations Respirations 20   Systolic BP Systolic BP 517   Diastolic BP (mmHg) Diastolic BP (mmHg) 72   Mean BP 86   BP Source  if not from Vital Sign Device doppler   Pulse Ox % Pulse Ox % 92   Pulse Ox Activity Level  At rest   Oxygen Delivery Room Air/ 21 %   Brief Assessment:   Cardiac Regular     Respiratory clear BS     Gastrointestinal details normal Soft  Nondistended  No masses palpable  No rebound tenderness  decreased bowel sounds, generalized tenderness, mostly epigastric    Lab Results:  Routine Chem:  26-Sep-13 12:01    Lipase  1022 (Result(s) reported on 27 Oct 2011 at 12:45PM.)  27-Sep-13 05:14    Lipase 139 (Result(s) reported on 28 Oct 2011 at 05:35AM.)  30-Sep-13 12:43    Lipase 339 (Result(s) reported on 31 Oct 2011 at 01:08PM.)   Assessment/Plan:  Assessment/Plan:   Assessment 1) biliary pancreatitis-results of MRCP noted, with previous relult of [place] noted.    Plan 1) appreciate surgical input.  Will follow through post op phase.   Electronic Signatures: Loistine Simas (MD)  (Signed 30-Sep-13 16:14)  Authored: Chief Complaint, VITAL SIGNS/ANCILLARY NOTES, Brief Assessment, Lab Results, Assessment/Plan   Last Updated: 30-Sep-13 16:14 by Loistine Simas (MD)

## 2014-05-20 NOTE — Op Note (Signed)
PATIENT NAME:  [name], [name] MR#:  [id number] DATE OF BIRTH:  14-May-1960  DATE OF PROCEDURE:  11/01/2011  PREOPERATIVE DIAGNOSIS: Gallstone pancreatitis.   POSTOPERATIVE DIAGNOSIS: Gallstone pancreatitis.   PROCEDURE PERFORMED: Laparoscopic cholecystectomy with cholangiogram.   INTRAOPERATIVE FINDINGS: Normal cholangiogram.   SURGEON: Lashuna Tamashiro A. Shataria Crist, MD  ANESTHESIA: General.   ESTIMATED BLOOD LOSS: 15 mL.   INDICATION FOR SURGERY: Mr. Nuttall is a pleasant 54 year old male with history of chronic pancreatitis, acute on chronic pancreatitis episodes as well. He was found to have sludge on his ultrasound and we thought that he may have a component of sludge pancreatitis and therefore elected to do laparoscopic cholecystectomy.   DETAILS OF PROCEDURE: Informed consent was obtained. Mr. Moehring was brought to the Operating Room. He was induced, endotracheal tube was placed, and general anesthesia was administered. His abdomen was then prepped and draped in standard surgical fashion. A supraumbilical incision was made. This was deepened down to the fascia. The fascia was grasped. Fascia was incised. The abdominal cavity was entered. There were no adhesions. Two 0 Vicryl stay suture were placed. A Hassan trocar was placed into the abdomen. The abdomen was insufflated. A 10 mm 30 degrees scope was placed in the abdomen. The gallbladder was visualized. An epigastric 11 mm port was placed to the right to the falciform ligament and two 5 mm ports were placed approximately two fingerbreadths below the right costal margin, one at the midclavicular line, one at the anterior axillary line. The lateral most trocar was used to place a retractor which grabbed the fundus of the gallbladder and lifted it up over the dome of the liver. Using the remaining ports the cystic duct and cystic artery are dissected out. The critical view was obtained. A cholangiogram was performed. It showed normal anatomy and  good flow into the small intestine. The cholangiogram catheter was then taken out and then the cystic duct and cystic artery were clipped. The gallbladder was then taken off the gallbladder fossa using electrocautery. It was taken out through the umbilical port in an EndoCatch bag. The abdomen was irrigated with approximately 3 liters of normal saline. The hepatic gallbladder fossa was examined and noted to be hemostatic. The trocar was then taken out under direct visualization. The supraumbilical fascia was closed with an 0 Vicryl on a UR-6. The rest of the trocar sites were then closed using interrupted 4-0 Monocryl. Dermabond then placed over the wounds. He was the awoken and brought to postanesthesia care unit. There were no immediate complications. Needle, sponge, and instrument counts correct at the end of the procedure.   ____________________________ Glena Norfolk. Anglia Blakley, MD cal:cms D: 11/01/2011 19:45:08 ET T: 11/02/2011 08:40:17 ET JOB#: [id number]  cc: Harrell Gave A. Arnisha Laffoon, MD, <Dictator>  Floyde Parkins MD ELECTRONICALLY SIGNED 11/04/2011 18:28

## 2014-05-20 NOTE — Consult Note (Signed)
PATIENT NAME:  [name], [name] MR#:  [id number] DATE OF BIRTH:  [birth year]  DATE OF CONSULTATION:  10/29/2011  REFERRING PHYSICIAN:  Dr. Manuella Ghazi  CONSULTING PHYSICIAN:  Lollie Sails, MD  REASON FOR CONSULTATION: Dilated bile duct.   HISTORY OF PRESENT ILLNESS: [name] is a 54 year old Caucasian male with a previous history of recurrent pancreatitis. He was admitted to [facility] on 10/27/2011 with abdominal pain. He states that his first episode of pancreatitis was about 2 to 2-1/2 years ago. At that time he came to [facility]. Evaluation there did not show a reason for the pancreatitis and an ERCP was done for further evaluation. At that time there was a pancreatic sphincterotomy as well as a pancreatic ductal stent placed. He went home. Several days later he got more ill and went to [facility]. There he was found to have apparently a small perforation and the stent was removed. There was no surgery needed at that time since the sites healed on its own. Since then he has had multiple episodes of pancreatitis with hospitalizations at [facility] as well as [facility]. He denies the use of any alcohol or tobacco products. He does not use drugs with the exception of pain medications that have been prescribed for his severe chronic back disorders. On admission to the hospital here at [facility] this time, he was found to have an elevated lipase at around noon on September 26th. By the next day his lipase had dropped down to 139. His hepatic profile was normal on admission. However, he did have an abdominal ultrasound on 09/27, this showing sludge in the gallbladder, a positive sonographic Murphy sign, no significant gallbladder wall thickening or pericholecystic fluid. However, the common bile duct was noted to be dilated to 10.1 mm and possibly having some intraductal opacifications. Mild intrahepatic ductal dilation.   I have reviewed through his [facility] chart with the following:  1. In October of 2008 he had a  colonoscopy which was normal.  2. On 07/31/2008 he had a normal MRCP showing no evidence of ductal dilation.  3. He had an abdominal ultrasound 07/22/2008 showing a common bile duct measurement at 5.7 mm. 4. He had an EGD on 08/15/2008 which was normal. 5. ERCP on 08/21/2008, this showing normal. At that time the pancreatic sphincterotomy and stent was done.   As such, there has been an interval development of increased ductal dimension as well as finding of sludge in the gallbladder and possibility of choledocholithiasis.   PAST MEDICAL HISTORY:  1. Multiple back surgeries as well as placement of several orthopedic hardware.  2. 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 3. L4-L5 fusion in April 2005. 4. L4-L5 rupture status post surgery 08/2003.  5. Status post septoplasty 1996. 6. Sinus surgery 2000.  7. Reversal of a spinal fusion due to disk bulging, nerve impingement L2 through L5.   8. Recurrent chronic pancreatitis. 9. Chronic back pain. 10. Constipation. 11. ADD with treatment. 12. Hyperlipidemia. 13. Epistaxis. 14. Depression. 15. Sleep disorder. 16. Allergic rhinitis. 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  18. History of mild coronary artery disease noted on a cath in 2007.   ALLERGIES: Penicillin.   OUTPATIENT MEDICATIONS:  1. Ambien 10 mg once a day.  2. Gabapentin 800 mg one 3 times a day.  3. Tamsulosin 0.4 mg once a day. 4. Trazodone 100 mg once a day. 5. Zofran 4 mg every six hours p.r.n.  6. Zyrtec 10 mg once a day.   GI  FAMILY HISTORY: Negative for colorectal cancer, liver disease, or ulcers. His mother did have colon polyps.   REVIEW OF SYSTEMS: 10 systems reviewed per admission history and physical, agree with same.     PHYSICAL EXAMINATION:   VITAL SIGNS: Temperature 97.7, pulse 75, respirations 18, blood pressure 122/72, pulse oximetry 97%.   GENERAL: He is a 54 year old Caucasian male in no acute distress. The patient states that he is  currently having pain of about 6 to 7 out of 10 compared with admission. He is not nauseated.   HEENT: Normocephalic, atraumatic.   EYES: Anicteric.   NOSE: Septum midline. No lesions.   OROPHARYNX: No lesions.   NECK: Supple. No JVD.   HEART: Regular rate and rhythm.   LUNGS: Clear.   ABDOMEN: Soft. He is tender to palpation in the right upper quadrant as well as the epigastric region, somewhat less so across the rest of the abdomen. There is no masses, rebound, or organomegaly.   RECTAL: Anorectal exam is deferred.   EXTREMITIES: No clubbing, cyanosis, or edema.   NEUROLOGICAL: Cranial nerves II through XII grossly intact. Muscle strength bilaterally equal and symmetric, 5 out of 5. Deep tendon reflexes bilaterally equal and symmetric.   LABORATORY, DIAGNOSTIC, AND RADIOLOGICAL DATA: On 09/26 he had a glucose of 81, BUN 9, creatinine 1.09, sodium 144, potassium 3.5, chloride 109, bicarb 26. Lipase was 1022. His hepatic profile was normal with a total bilirubin of 0.5. Troponin-I x1 was normal. Urine drug screen was positive for benzodiazepine as well as opiates. He takes these medications as an outpatient. Hemogram was normal. Repeat laboratories the following morning showed his lipase down to 139. His hemogram showed a white count of 7.2, hemoglobin and hematocrit 13.5/38.5, platelet count 230. He had an INR of 0.9. Urinalysis was normal.   He had a PA and lateral chest film showing no evidence of acute cardiopulmonary abnormality.  He had a CT scan of the head without contrast due to right-sided numbness. No acute intracranial process.   He had an abdominal ultrasound with results as noted above, sludge in the gallbladder and a dilated common bile duct at 10.1 mm with possible choledocholithiasis.   ASSESSMENT: The patient is presenting with history of recurrent pancreatitis extending back several years. There has, however, been a marked change in the common bile duct as well as  findings of possible choledocholithiasis as well as gallbladder sludge. His pancreatitis at this point would present as a biliary pancreatitis and I have concern that there is intermittent obstruction of the common bile duct.   RECOMMENDATION:  1. Will proceed with MRCP.  2. Would recommend surgical consultation in regards to possible cholecystectomy.  3. The patient may also need to have ERCP done with sweeping of the common bile duct if indeed there is evidence of choledocholithiasis.   Thank you for this consult.   ____________________________ Lollie Sails, MD mus:drc D: 10/29/2011 17:44:57 ET T: 10/30/2011 08:07:50 ET JOB#: [id number]  cc: Lollie Sails, MD, <Dictator> Lollie Sails MD ELECTRONICALLY SIGNED 12/01/2011 11:19

## 2014-05-20 NOTE — Consult Note (Signed)
Chief Complaint:   Subjective/Chief Complaint main c/o is back pain (chronic) but continues with "7/10" epigastric pain.  On clears.  no n/v. passing flatus.   VITAL SIGNS/ANCILLARY NOTES: **Vital Signs.:   29-Sep-13 05:46   Vital Signs Type Routine   Temperature Temperature (F) 97.8   Celsius 36.5   Temperature Source Oral   Pulse Pulse 61   Respirations Respirations 17   Systolic BP Systolic BP 099   Diastolic BP (mmHg) Diastolic BP (mmHg) 76   Mean BP 90   Pulse Ox % Pulse Ox % 97   Pulse Ox Activity Level  At rest   Oxygen Delivery Room Air/ 21 %   Brief Assessment:   Cardiac Regular    Respiratory clear BS    Gastrointestinal details normal Soft  Nondistended  No masses palpable  positive bs, mild tenderness to palpation ruq/epigastrum/some luq.   Lab Results: Routine Chem:  27-Sep-13 05:14    Lipase 139 (Result(s) reported on 28 Oct 2011 at 05:35AM.)   Glucose, Serum 82   BUN 10   Creatinine (comp) 1.14   Sodium, Serum 142   Potassium, Serum 3.9   Chloride, Serum 107   CO2, Serum 26   Calcium (Total), Serum  8.2   Anion Gap 9   Osmolality (calc) 281   eGFR (African American) >60   eGFR (Non-African American) >60 (eGFR values <4m/min/1.73 m2 may be an indication of chronic kidney disease (CKD). Calculated eGFR is useful in patients with stable renal function. The eGFR calculation will not be reliable in acutely ill patients when serum creatinine is changing rapidly. It is not useful in  patients on dialysis. The eGFR calculation may not be applicable to patients at the low and high extremes of body sizes, pregnant women, and vegetarians.)   Magnesium, Serum 1.8 (1.8-2.4 THERAPEUTIC RANGE: 4-7 mg/dL TOXIC: > 10 mg/dL  -----------------------)  Routine Hem:  27-Sep-13 05:14    WBC (CBC) 7.2   RBC (CBC)  4.19   Hemoglobin (CBC) 13.5   Hematocrit (CBC)  38.5   Platelet Count (CBC) 230   MCV 92   MCH 32.2   MCHC 35.0   RDW 13.0   Neutrophil % 56.8    Lymphocyte % 31.9   Monocyte % 8.9   Eosinophil % 1.8   Basophil % 0.6   Neutrophil # 4.1   Lymphocyte # 2.3   Monocyte # 0.6   Eosinophil # 0.1   Basophil # 0.0 (Result(s) reported on 28 Oct 2011 at [facility])   Assessment/Plan:  Assessment/Plan:   Assessment 1) biliary pancreatitis.  evidence of biliary sludge on [place] days ago with dilated cbd, now negative on MRCP today.  ?Possible passage of sludge in interval?    Plan 1) awaiting surgical opinion, re elective CCY.  continue current.   Electronic Signatures: SLoistine Simas(MD)  (Signed 29-Sep-13 14:35)  Authored: Chief Complaint, VITAL SIGNS/ANCILLARY NOTES, Brief Assessment, Lab Results, Assessment/Plan   Last Updated: 29-Sep-13 14:35 by SLoistine Simas(MD)

## 2014-05-20 NOTE — H&P (Signed)
PATIENT NAME:  [name], MONTESINOS MR#:  [id number] DATE OF BIRTH:  17-Jan-1961  DATE OF ADMISSION:  10/27/2011  REFERRING PHYSICIAN: ER physician, Dr. Renard Hamper  PRIMARY CARE PHYSICIAN: Dr. Einar Pheasant   CHIEF COMPLAINT: Abdominal pain.   HISTORY OF PRESENT ILLNESS: Patient is a 54 year old male with past medical history of recurrent pancreatitis. 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 He had undergone extensive work-up including MRI of the spine, HIDA scan, upper endoscopy and ERCP, which were normal except for sphincter of Oddi dysfunction. Patient at that time had undergone a sphincterectomy and pancreatic stent placement. Patient reports that during that procedure a hole was created in his stomach and he became septic. Subsequently he went to [facility] and spend a long period of time over there and his pancreatic stent was removed in [place]. He has been seeing Dr. Lessie Dings of GI service in [place] since then. He has also been to [facility] multiple times. Patient reports that he has been battling with pain in his stomach for about a month and had severe nausea, vomiting and diarrhea and abdominal pain therefore he went to [facility] at that time and had work-up including CAT scan where he was told that he had biliary ductal inflammation. He was discharged home. He was last admitted to [facility] about a year ago for 14 days. It appears he has never had EUS done. Patient is interested in getting admitted to [facility]. He reports that only Dilaudid works for pain and Zofran works for nausea for him. He reports that he was taking Diazepam, MS Contin and Percocet prescribed by Dr. Mauri Pole for severe back pain. Patient reports that he has been unable to find anybody to renew any of those medications, therefore, he has weaned him off those medications and has not taken any for the last two  months. His urine drug screen was positive for benzodiazepines and opiates.   PAST MEDICAL HISTORY: 1. Recurrent pancreatitis. 2. Chronic back pain. 3. Constipation. 4. Attention deficit disorder. 5. Hyperlipidemia. 6. Exposure to hepatitis B core antibody. 7. Epistaxis. 8. Depression and sleep disturbance. 9. Allergic rhinitis. 10. Temporomandibular joint dysfunction. 11. Coronary artery disease with 30% blockage in 2007.    PAST SURGICAL HISTORY:  1. 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 2. L4-L5 fusion April 2005. 3. L4-L5 rupture status post surgery 08/2003. 4. Status post septoplasty 1996. 5. Sinus surgery 2000. 6. Subsequent reversal of spinal fusion due to disk bulging. 7. Nerve impingement L2-L5.   CURRENT MEDICATIONS: 1. Trazodone 100 mg daily.  2. Ambien at bedtime p.r.n., dose unknown. 3. Flomax 0.4 mg daily.  4. Neurontin 800 mg t.i.d.  5. P.R.N. Zofran.  6. Patient reported that he was taking diazepam 10 mg 5 times a day as needed, MS Contin 30 mg 4 times a day, Percocet 7.5/325 t.i.d. p.r.n. but has not taken any for the last two months since he was unable to get them represcribed.   ALLERGIES: Penicillin and sulfa.   FAMILY HISTORY: Father died of myocardial infarction and stroke. Mother has colonic polyps, dementia, coronary artery disease, hypertension.   SOCIAL HISTORY: Denies any history of smoking, alcohol or drug abuse. Patient is currently separated and has custody of his children.    REVIEW OF SYSTEMS: CONSTITUTIONAL: Denies any fever, fatigue. EYES: Denies any blurred or  double vision. ENT: Denies any tinnitus, ear pain. RESPIRATORY: Denies any cough, wheezing. CARDIOVASCULAR: Denies any chest pain, orthopnea. GASTROINTESTINAL: Reports recent nausea, vomiting, diarrhea, severe abdominal pain. GENITOURINARY: Denies any dysuria, hematuria. ENDO: Denies any polyuria, nocturia. HEME/LYMPH: Denies any anemia, easy bruisability.  INTEGUMENT: Denies any acne, rash. MUSCULOSKELETAL: Denies any swelling, gout. NEUROLOGICAL: Denies any numbness, weakness. PSYCH: Has history of anxiety, attention deficit/hyperactivity disorder, depression.   PHYSICAL EXAMINATION:  VITAL SIGNS: Temperature 97.7, heart rate 73, respiratory rate 22, blood pressure 143/84, pulse oximetry 97% on room air.   GENERAL: Patient is a 54 year old Caucasian male lying comfortably in bed, not in acute distress.   HEAD: Atraumatic, normocephalic.   EYES: No pallor, icterus, or cyanosis. Pupils equal, round, and reactive to light and accommodation. Extraocular movements intact.   ENT: Wet mucous membranes. No oropharyngeal erythema or thrush.   NECK: Supple. No masses. No JVD. No thyromegaly or lymphadenopathy.   CHEST WALL: No tenderness to palpation. Not using accessory muscles of respiration. No intercostal muscle retractions.   LUNGS: Bilaterally clear to auscultation. No wheezing, rales, or rhonchi.   CARDIOVASCULAR: S1, S2 regular. No murmur, rubs, or gallops.   ABDOMEN: Soft, nondistended. No guarding or rigidity. Patient reports epigastric tenderness to palpation. Normal bowel sounds.   SKIN: No rashes or lesions.  PERIPHERIES: No pedal edema. 2+ pedal pulses.   MUSCULOSKELETAL: No cyanosis or clubbing.   NEUROLOGIC: Awake, alert, oriented x3. Nonfocal neurologic exam. Cranial nerves grossly intact.   PSYCH: Normal mood and affect.   LABORATORY, DIAGNOSTIC, AND RADIOLOGICAL DATA: Chest x-ray shows no evidence of any acute cardiopulmonary pathology. Urinalysis shows no evidence of any infection. UDS is positive for opiates and benzodiazepines. CBC normal. Complete metabolic panel normal. Lipase 1022.   ASSESSMENT AND PLAN: 54 year old male presents with recurrent abdominal pain.  1. Recurrent pancreatitis. Patient's lipase is elevated. He has had extensive work-up in the past including ultrasound, CT scan, M.R.C.P., ERCP, HIDA scan,  endoscopy all of which have been normal. Patient had undergone a sphincterectomy with pancreatic stent in 2007, 2010 at [facility], subsequently, patient reports that he became septic and the stent had to be removed at [facility]. He reports to me that the only medication that works for pain for him is Dilaudid. Will admit him to the hospital, start on IV fluid hydration, start p.r.n. analgesics and antiemetics. Will keep him n.p.o. except medications and ice. Will recheck a lipase level tomorrow morning. Will not repeat any imaging since he was recently at [facility]. Will try and get records from [facility] and [facility]. Further management will depend on patient's progress. 2. Anxiety and attention deficit/hyperactivity disorder. Will continue patient's trazodone.   3. Chronic pain with neuropathy. Will continue patient's Neurontin. 4. Benign prostatic hypertrophy. Will continue patient's Flomax.  Reviewed old medical records, discussed with the ED physician, discussed with the patient the plan of care and management.   TIME SPENT: 75 minutes.   ____________________________ Cherre Huger, MD sp:cms D: 10/27/2011 16:08:03 ET T: 10/27/2011 16:33:26 ET JOB#: [id number]  cc: Cherre Huger, MD, <Dictator> Einar Pheasant, MD Cherre Huger MD ELECTRONICALLY SIGNED 10/27/2011 18:44

## 2014-05-23 NOTE — Discharge Summary (Signed)
PATIENT NAMEKEITHON, [name] MR#:  [id number] DATE OF BIRTH:  February 02, 1960  DATE OF ADMISSION:  11/23/2012 DATE OF DISCHARGE:  11/25/2012  REASON FOR ADMISSION: Abdominal pain.   DISCHARGE DIAGNOSES: 1. Acute on chronic pancreatitis.  2. Elevated liver function tests.  3. Chronic pain.  4. Acute kidney injury, now resolved.  5. Chronic back pain.  6. Constipation.  7. Attention deficit disorder.  8. Hyperlipidemia.  9. History of recurrent epistaxis.  10. Depression.  11. Sleep disturbances.  12. Rhinitis.  13. Coronary artery disease, medical management with 30% blockage.   HOSPITAL COURSE: A 54 year old gentleman with history of recurrent pancreatitis, multiple admissions at this hospital and [place]. These recurrent episodes of pancreatitis have been going on  steadily for the past couple of years. The last time he was admitted was three months prior to this hospitalization.   The patient has been diagnosed previously with sphincter of Oddi dysfunction. He has had a cholecystectomy in the past due to his recurrent pancreatitis and has undergone extensive work-up. He follows up at [facility] with a GI doctor there.  The patient came this time reporting increased abdominal pain located in the epigastric area.  Once he was evaluated he was noted to have a lipase of 10,000 and some leukocytosis of 12,000. He did not have any fever. IV fluids were started and the patient was kept n.p.o. He was noticed to have no elevation of cardiac enzymes but mild elevation of the liver function tests. GI was consulted. They did not have any major recommendations. As the patient kept having these episodes, we will discharge him on pancrelipase (that is Creon).   The patient had a creatinine 1.35 on admission; after IV fluids dropped to 0.9. His lipase on admission was 10,000, and at discharge was 34,000. The pancreas was not normal by discharge, but the patient clinically was stable and he wanted to go home. His  pain was resolved and he was tolerating diet. His white count at discharge was 6000. Hemoglobin was 14,000.  There was mild dilation of the common bile duct, but that was secondary to his previous cholecystectomy. No signs of obstruction.   The patient was discharged without any significant pain with the following medications: Trazodone 100 mg once a day, Adderall 30 mg twice daily, Claritin 10 mg once a day, fluticasone two sprays a day every day, Zofran 4 mg every eight hours, Ambien 10 mg at bedtime, Vicodin 300/5 every six hours, CREON 12,000 units prior to meals. The patient has been given recommendations to have a very low fat, low cholesterol diet. Follow up with Dr. Lina Sayre in the next 1 to 2 weeks and [facility] in the next 1 to two weeks GI.    I spent about 45 minutes with this dictation.  ____________________________ Summerfield Sink, MD rsg:sg D: 11/26/2012 06:53:00 ET T: 11/26/2012 07:47:59 ET JOB#: [id number]  cc: [facility] Einar Pheasant, MD [place] Sink, MD, <Dictator>    Khadeeja Elden America Brown MD ELECTRONICALLY SIGNED 12/10/2012 6:45

## 2014-05-23 NOTE — Consult Note (Signed)
PATIENT NAME:  [name], [name] MR#:  [id number] DATE OF BIRTH:  July 29, 1960  DATE OF CONSULTATION:  11/23/2012  REFERRING PHYSICIAN: Dr. Waldron Labs. CONSULTING PHYSICIAN:  Einar Pheasant, MD  PRIMARY GASTROENTEROLOGIST: [facility], the patient cannot remember physician name.   REASON FOR CONSULTATION: Recurrent pancreatitis.   HISTORY OF PRESENT ILLNESS: [name] is a 54 year old Caucasian male with a history of recurrent pancreatitis. He tells me he has had 3 hospital admissions this year, the last one being 2 months ago at [facility]. He has extensive history of workup both here, [facility] and [facility]. This all began in 2010 where he came to [facility]. He had, what was felt to be, idiopathic pancreatitis and an ERCP was done where he had a pancreatic sphincterotomy as well a pancreatic ductal stent placed. Several days later, he became ill and went to [facility] where he was found to have a small perforation and the stent was removed. He had no surgical intervention. Since that time, he has had multiple episodes of pancreatitis and hospitalizations at [facility] as well as [facility]. He describes further evaluation of his pancreatitis; however, he is unsure whether he has had endoscopic ultrasound. He does not drink any alcohol. Around 7:00 p.m. last night, he began to have severe epigastric and left upper quadrant pain. The pain radiated up to his left anterior chest. He had some shortness of breath. The pain was 10 out of 10 on pain scale. He had nausea, but did not vomit as he took Zofran, which seems to help is vomiting. He has chronic back pain and is followed by the pain clinic at [facility] as he has had multiple back surgeries as well. He is status post cholecystectomy where he was found to have a dilated common bile duct to 10.1 and some intraductal opacifications with  intrahepatic ductal dilation. He has been told he has sphincter of Oddi dysfunction.   PAST MEDICAL AND SURGICAL HISTORY: October 2008, normal colonoscopy. He has had ERCP with pancreatic sphincterotomy and stent 08/21/2008. Recurrent pancreatitis with sphincter of Oddi dysfunction. He is status post cholecystectomy. He had an EGD 08/15/2008, which was normal. Multiple back surgeries and orthopedic hardware in 1996 and 2005. Septoplasty. Sinus surgery. Spinal fusion. Constipation. ADD. Hyperlipidemia. Epistaxis. Depression. Sleep disorder. Allergic rhinitis. TMJ dysfunction. Coronary artery disease.   MEDICATIONS PRIOR TO ADMISSION: Adderall 30 mg b.i.d., Ambien 10 mg at bedtime, Claritin 10 mg daily, fluticasone 50 mcg 2 sprays daily, trazodone 100 mg daily, Vicodin 300 mg/5 mg q.6 hours p.r.n. pain and Zofran 4 mg q.8 hours p.r.n.   ALLERGIES: PENICILLIN CAUSES ANAPHYLAXIS.   FAMILY HISTORY: There is no family history of colon carcinoma, liver or chronic GI problems.   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 He is in the process of divorce. He denies any tobacco, alcohol or illicit drug use.   REVIEW OF SYSTEMS: See HPI. RESPIRATORY: He did have some shortness of breath on exertion. Denies any cough or hemoptysis and a positive d-dimer. Otherwise negative review of systems.   PHYSICAL EXAMINATION:  VITAL SIGNS: Temperature 98.7, pulse 63, respirations 18, blood pressure 124/78, oxygen saturation 94% on room air.  GENERAL: He is a well-developed, well-nourished Caucasian male in no acute distress.  HEENT: Sclerae clear, anicteric. Conjunctivae pink. Oropharynx pink and moist without any lesions.  NECK: Supple without mass or thyromegaly.  CHEST:  Heart regular rate and rhythm. Normal S1, S2. No murmurs, clicks, rubs or gallops.  LUNGS: Clear to auscultation bilaterally.  ABDOMEN: Positive bowel sounds x 4. No bruits auscultated. Abdomen is soft,  nondistended. He does have mild left upper quadrant and epigastric tenderness and tenderness around the umbilicus on deep palpation. There is no rebound, tenderness or guarding. No hepatosplenomegaly or mass.  EXTREMITIES: Without edema or clubbing bilaterally.  SKIN: Pink, warm and dry without any rash or jaundice.  NEUROLOGIC: Grossly intact.  MUSCULOSKELETAL: Good equal strength and movement bilaterally.  PSYCHIATRIC: He is alert, pleasant, normal mood and affect.   IMPRESSION: [name] is a 54 year old Caucasian male with recurrent pancreatitis, likely due to sphincter of  Oddi dysfunction. His gastroenterologist is at [facility] and he has been followed by [facility] before. This is his third admission this year. He is status post ERCP with pancreatic duct sphincterotomy and stent placement back in 2010 and as well as status post cholecystectomy. He is followed by [facility] as well for chronic back pain.  RECOMMENDATIONS:  1.  We will request records from [facility] and [facility]. 2.  Would advance to clear liquid diet today if he is able to tolerate. If it increases pain, then would keep him n.p.o.  3.  I agree with PPI.  4.  Supportive measures. I will be discussing his care with Dr. Lucilla Lame.   ____________________________ Andria Meuse, NP klj:aw D: 11/23/2012 12:41:23 ET T: 11/23/2012 12:52:22 ET JOB#: [id number]  cc: Andria Meuse, NP, <Dictator> [place] ELECTRONICALLY SIGNED 12/10/2012 8:41

## 2014-05-23 NOTE — Consult Note (Signed)
Brief Consult Note: Diagnosis: Recurrent pancreatitis.   Patient was seen by consultant.   Consult note dictated.   Comments: The patient has had pancreatitis in the past with an extensive workup at [place]. Patient came to this hospital because he was unable to drive himself to either of those places. Continue conservative managment with aggressive hydration and pain medication.  Electronic Signatures: Lucilla Lame (MD)  (Signed 24-Oct-14 18:10)  Authored: Brief Consult Note   Last Updated: 24-Oct-14 18:10 by Lucilla Lame (MD)

## 2014-05-23 NOTE — Consult Note (Signed)
Chief Complaint:  Subjective/Chief Complaint Pancreatitis with abdpain.   VITAL SIGNS/ANCILLARY NOTES: **Vital Signs.:   25-Oct-14 04:33  Vital Signs Type Routine  Temperature Temperature (F) 97.4  Celsius 36.3  Temperature Source oral  Pulse Pulse 69  Respirations Respirations 18  Systolic BP Systolic BP 782  Diastolic BP (mmHg) Diastolic BP (mmHg) 66  Mean BP 79  Pulse Ox % Pulse Ox % 93  Pulse Ox Activity Level  At rest  Oxygen Delivery Room Air/ 21 %  *Intake and Output.:   Shift 25-Oct-14 15:00  Grand Totals Intake:  120 Output:      Net:  120 24 Hr.:  120  Oral Intake      In:  120  Length of Stay Totals Intake:  3080 Output:      Net:  3080   Brief Assessment:  GEN well developed, well nourished, no acute distress   Respiratory normal resp effort   Additional Physical Exam Alert and orientated times 3   Lab Results: Routine Chem:  25-Oct-14 04:59   Lipase  3572 (Result(s) reported on 24 Nov 2012 at 05:31AM.)  Glucose, Serum  104  BUN 8  Creatinine (comp) 0.95  Sodium, Serum 137  Potassium, Serum 3.9  Chloride, Serum 107  CO2, Serum 26  Calcium (Total), Serum  8.2  Anion Gap  4  Osmolality (calc) 272  eGFR (African American) >60  eGFR (Non-African American) >60 (eGFR values <86m/min/1.73 m2 may be an indication of chronic kidney disease (CKD). Calculated eGFR is useful in patients with stable renal function. The eGFR calculation will not be reliable in acutely ill patients when serum creatinine is changing rapidly. It is not useful in  patients on dialysis. The eGFR calculation may not be applicable to patients at the low and high extremes of body sizes, pregnant women, and vegetarians.)  Routine Hem:  25-Oct-14 04:59   WBC (CBC) 6.0  RBC (CBC)  4.37  Hemoglobin (CBC) 14.1  Hematocrit (CBC)  39.8  Platelet Count (CBC) 236  MCV 91  MCH 32.3  MCHC 35.5  RDW 13.0  Neutrophil % 61.5  Lymphocyte % 26.0  Monocyte % 7.9  Eosinophil % 4.0   Basophil % 0.6  Neutrophil # 3.7  Lymphocyte # 1.6  Monocyte # 0.5  Eosinophil # 0.2  Basophil # 0.0 (Result(s) reported on 24 Nov 2012 at 05:27AM.)   Assessment/Plan:  Assessment/Plan:  Assessment Acute pancreatitis.   Plan The patient is tolerating liquids. Feels much better. Pancreatic enzymes improved. Will follow with you [facility]. Continue current therapy.   Electronic Signatures: WLucilla Lame(MD)  (Signed 25-Oct-14 11:22)  Authored: Chief Complaint, VITAL SIGNS/ANCILLARY NOTES, Brief Assessment, Lab Results, Assessment/Plan   Last Updated: 25-Oct-14 11:22 by WLucilla Lame(MD)

## 2014-05-23 NOTE — Consult Note (Signed)
Chief Complaint:  Subjective/Chief Complaint The patient says he would like o eat and feels much better today. Labs are not much better today but symptoms are. There is no reports of vomiting. Had some pain with jello but not with the other clear liquids.   VITAL SIGNS/ANCILLARY NOTES: **Vital Signs.:   26-Oct-14 05:10  Vital Signs Type Routine  Temperature Temperature (F) 98.1  Celsius 36.7  Pulse Pulse 87  Respirations Respirations 18  Systolic BP Systolic BP 601  Diastolic BP (mmHg) Diastolic BP (mmHg) 66  Mean BP 81  Pulse Ox % Pulse Ox % 96  Pulse Ox Activity Level  At rest  Oxygen Delivery Room Air/ 21 %  *Intake and Output.:   Shift 26-Oct-14 15:00  Grand Totals Intake:  960 Output:      Net:  960 24 Hr.:  960  Oral Intake      In:  960  Length of Stay Totals Intake:  7777 Output:      Net:  7777   Brief Assessment:  GEN well developed, well nourished, no acute distress   Respiratory normal resp effort   Additional Physical Exam Alert and orientated times 3   Lab Results: Routine Chem:  26-Oct-14 04:54   Lipase  3400 (Result(s) reported on 25 Nov 2012 at 05:59AM.)   Assessment/Plan:  Assessment/Plan:  Assessment Pancreatitis.   Plan Partient doing better although labs not much improved. Will advance diet to see how he does.   Electronic Signatures: Lucilla Lame (MD)  (Signed 26-Oct-14 10:18)  Authored: Chief Complaint, VITAL SIGNS/ANCILLARY NOTES, Brief Assessment, Lab Results, Assessment/Plan   Last Updated: 26-Oct-14 10:18 by Lucilla Lame (MD)

## 2014-05-23 NOTE — Op Note (Signed)
PATIENT NAME:  [name], [name] MR#:  [id number] DATE OF BIRTH:  1960-02-09  DATE OF PROCEDURE:  08/08/2012  PREOPERATIVE DIAGNOSIS: Right forearm cyst.   POSTOPERATIVE DIAGNOSIS: Right forearm mass, possible pseudoaneurysm.   ANESTHESIA: General.  SURGEON: Laurene Footman, MD   PROCEDURE: Excision of mass, right distal forearm.   DESCRIPTION OF PROCEDURE: The patient was brought to the Operating Room and after adequate anesthesia was obtained, the right arm was prepped and draped in the usual sterile fashion with a tourniquet applied to the upper arm. After prepping and draping in the usual sterile manner, appropriate patient identification and timeout procedures were completed. A longitudinal incision was made approximately a centimeter in length directly overlying the mass. It came up out of the subcutaneous tissue and was grasped with a hemostat and removed with scissors. It had the appearance of being vascular and this was sent as specimen. There was bleeding from underlying vessel and it appeared to have come off of an underlying vessel, which was then cauterized. The wound was irrigated and then closed with a subcuticular 4-0 Monocryl and Dermabond, with 10 mL of 0.5% Sensorcaine without epinephrine injected proximally to aid in postoperative analgesia. A sterile dressing was applied with Telfa and an Ace wrap. The patient was sent to the recovery room in stable condition.   ESTIMATED BLOOD LOSS: Minimal.   COMPLICATIONS: None.   SPECIMEN: Excised mass, right distal forearm.    ____________________________ Laurene Footman, MD [id number] D: 08/08/2012 16:59:15 ET T: 08/08/2012 19:56:49 ET JOB#: [id number]  cc: Laurene Footman, MD, <Dictator> Laurene Footman MD ELECTRONICALLY SIGNED 08/09/2012 12:12

## 2014-05-23 NOTE — Consult Note (Signed)
Brief Consult Note: Diagnosis: recurrent pancreatitis.   Patient was seen by consultant.   Consult note dictated.   Comments: [name] is a 54 y/o caucasian male with recurrent pancreatitis secondary to sphincter of Oddi dysfunction.  Lipase greater than 10000.  Plan: 1) advance to clear liquids if tolerated.  If pain ,nausea or vomiting, resume NPO 2) Old records requested from [place] 3) PPI 4) supportive measures  Thanks for consult.  Please see full dictated note. #[id number].  Electronic Signatures: Andria Meuse (NP)  (Signed 24-Oct-14 12:48)  Authored: Brief Consult Note   Last Updated: 24-Oct-14 12:48 by Andria Meuse (NP)

## 2014-05-23 NOTE — H&P (Signed)
PATIENT NAME:  [name], [name] MR#:  [id number] DATE OF BIRTH:  10-12-1960  DATE OF ADMISSION:  11/23/2012  REFERRING PHYSICIAN: Dr.  Charlesetta Ivory  PRIMARY CARE PHYSICIAN: Dr. Einar Pheasant   CHIEF COMPLAINT: Abdominal pain.   HISTORY OF PRESENT ILLNESS: This 54 year old male with known history of recurrent pancreatitis, admitted multiple times here and [facility] in the last couple years for episodes of recurrent pancreatitis. The patient reports the last time he was in [place] before three months ago. As well, the patient is having a history of chronic abdominal pain and chronic lower back pain due to previous surgeries. The patient has a history of a sphincter of Oddi dysfunction. He had cholecystectomy last year because of his recurrent pancreatitis. The patient had extensive work-up in the past, including MRI, HIDA scan, endoscopy, ERCP. Reports he had an appointment to follow with [facility]  scheduled in two weeks, which did not see yet.  Reports he has not been following with GI here in [place]., The patient reports yesterday he developed abdominal pain, mainly epigastric. This got worse during the afternoon, which prompted him to come to the ED, reports some nausea, but denies any vomiting. Denies any pleuritic chest pain, any shortness of breath, reports this pain resembles acute pancreatitis episodes he had in the past. The patient had basic workup done including lipase which did show evidence of elevated lipase level at more than 10,000. The patient has had mild leukocytosis at 12.2, but he did not have any fever. The patient was given 2 liters of IV normal saline bolus in the ED required multiple IV pain medications to control his pain, so hospitalist service were requested to admit the patient.   PAST MEDICAL HISTORY: 1.  Recurrent pancreatitis.  2.  Chronic back pain.  3.  Constipation.  4.  Attention deficit disorder.  5.  Hyperlipidemia.  6.  Exposure to hepatitis B core antibody.  7.   Epistaxis.  8.  Depression and sleep disturbance.  9.  Allergic rhinitis.  10.  Temporomandibular joint dysfunction. 11.  Coronary artery disease with 30% blockage in 2007.   PAST SURGICAL HISTORY:  1.  Multiple lower back surgeries.  2.  Status post septoplasty in 1996. 3.  Laparoscopic cholecystectomy last year.  4.  Sinus surgery, 2000. 5.  Subsequent reversal of spinal fusion due to disk bulging.  6.  Impingement L2-L5.   ALLERGIES: PENICILLIN AND SULFA.   HOME MEDICATIONS: 1.  Vicodin as needed.  2.  Trazodone 100 mg as needed.  3.  Zofran as needed.  4.  Claritin 10 mg daily.  5.  Ambien 10 mg at bedtime.  6.  Adderall 30 mg p.o. b.i.d.  7.  Fluticasone nasal spray two sprays each nostril daily.   FAMILY HISTORY: Significant for coronary artery disease and CVA in his father, and mother has history of coronary artery disease, hypertension and dementia.   SOCIAL HISTORY: No history of smoking, alcohol or drug use.   REVIEW OF SYSTEMS: CONSTITUTIONAL: The patient denies fever, chills, fatigue, weakness, weight gain, weight loss.  EYES: Denies blurry vision, double vision, inflammation or glaucoma.  ENT: Denies tinnitus, ear pain, hearing loss, epistaxis or discharge.  RESPIRATORY: Denies cough, wheezing, hemoptysis, dyspnea and COPD.   CARDIOVASCULAR: Denies chest pain, edema, arrhythmia, palpitation.  GASTROINTESTINAL: Complains of nausea, abdominal pain. Denies vomiting, diarrhea, hematemesis, melena, jaundice.  GENITOURINARY: Denies dysuria, hematuria, renal colic.  ENDOCRINE: Denies polyuria, polydipsia. HEMATOLOGIC: Denies anemia, easy bruising, bleeding diathesis.  INTEGUMENT: Denies acne, rash or skin lesions.  MUSCULOSKELETAL: Complains of chronic lower back pain. Denies arthritis, gout or swelling or cramps.  NEUROLOGIC: Denies CVA, TIA, ataxia, headache, tremors.  PSYCHIATRIC: Denies depression, schizophrenia, substance or alcohol abuse. Reports history of  insomnia.   PHYSICAL EXAMINATION: VITAL SIGNS: Temperature 97.7, pulse 83, respiratory rate 20, blood pressure 125/67, saturating 100%.  GENERAL: Well-nourished male who looks comfortable in bed, in no apparent distress.  HEENT: Head is atraumatic, normocephalic. Pupils equal, reactive to light. Pink conjunctivae. Anicteric sclerae. Moist oral mucosa.  NECK: Supple. No thyromegaly. No JVD.  CHEST: Good air entry bilaterally. No wheezing, rales or rhonchi.  CARDIOVASCULAR: S1, S2, no rubs, murmur or gallops.  ABDOMEN: Has mild epigastric tenderness. No rebound, no guarding. Bowel sounds present. No hepatosplenomegaly appreciated.  EXTREMITIES: No edema. No clubbing. No cyanosis. Dorsalis pedis pulse +2 bilaterally.  PSYCHIATRIC: Appropriate affect. Awake, alert x 3. Intact judgment and insight.  NEUROLOGIC: Cranial nerves grossly intact. Motor 5 out of 5. No focal deficits.  SKIN: Normal skin turgor. Warm and dry.  LYMPHATIC: No cervical, supraclavicular lymphadenopathy.   PERTINENT LABORATORY AND DIAGNOSTICS: Glucose 86, BUN 27, creatinine 1.35, sodium 137, potassium 3.6, chloride 105, CO2 25, lipase more than 10,000. ALT 95, AST 165, alk phos 124, troponin less than 0.02. White blood cells 12.2, hemoglobin 14.9, hematocrit 41.7, platelets 268.   EKG showing normal sinus rhythm at 64 beats per minute with no changes from last EKG in September 2013.   ASSESSMENT AND PLAN: 1. Recurrent pancreatitis. The patient is known to have history of recurrent pancreatitis, was related in the past due to sphincter of Oddi dysfunction, already has cholecystectomy. The patient will be admitted for hydration and pain and nausea management. Will be kept on p.r.n. p.o. pain and nausea medications. Will be kept n.p.o. and ice chips, will advance diet as tolerated. We will consult gastroenterology service. Will check right upper quadrant ultrasound. Due to the fact the patient does not have history of coronary  artery disease, but he has no EKG changes, we will continue to cycle his cardiac enzymes.   2.  Elevated liver function tests, mildly elevated. We will check right upper quadrant ultrasound.  We will avoid hepatotoxic medication medications.  3.   Chronic pain. We will continue the patient on Neurontin. He is already on p.r.n. pain meds.  4.  Elevated D-dimer, this is most likely due to on chronic inflammatory state, the patient does not have any stigmata of blood clot, so at this point, there is no further work-up indicated for this findings. 5.  Deep vein thrombosis prophylaxis, subcutaneous Heparin. 6.  Gastrointestinal prophylaxis, on proton pump inhibitor.  7.  CODE STATUS: Full code.   Total time spent on admission and patient care: 50 minutes.     ____________________________ Albertine Patricia, MD dse:NTS D: 11/23/2012 05:33:21 ET T: 11/23/2012 06:05:46 ET JOB#: [id number]  cc: Albertine Patricia, MD, <Dictator> Herminio Kniskern Graciela Husbands MD ELECTRONICALLY SIGNED 11/24/2012 6:35

## 2014-06-12 ENCOUNTER — Encounter: Payer: Self-pay | Admitting: Internal Medicine

## 2014-06-12 ENCOUNTER — Ambulatory Visit (INDEPENDENT_AMBULATORY_CARE_PROVIDER_SITE_OTHER): Payer: PPO | Admitting: Internal Medicine

## 2014-06-12 ENCOUNTER — Encounter: Payer: Self-pay | Admitting: *Deleted

## 2014-06-12 VITALS — BP 120/80 | HR 73 | Temp 98.6°F | Ht 73.0 in | Wt 230.2 lb

## 2014-06-12 DIAGNOSIS — Z8719 Personal history of other diseases of the digestive system: Secondary | ICD-10-CM | POA: Diagnosis not present

## 2014-06-12 DIAGNOSIS — I471 Supraventricular tachycardia: Secondary | ICD-10-CM

## 2014-06-12 DIAGNOSIS — M549 Dorsalgia, unspecified: Secondary | ICD-10-CM

## 2014-06-12 DIAGNOSIS — E7800 Pure hypercholesterolemia, unspecified: Secondary | ICD-10-CM

## 2014-06-12 DIAGNOSIS — R000 Tachycardia, unspecified: Secondary | ICD-10-CM

## 2014-06-12 DIAGNOSIS — Z0000 Encounter for general adult medical examination without abnormal findings: Secondary | ICD-10-CM

## 2014-06-12 DIAGNOSIS — E871 Hypo-osmolality and hyponatremia: Secondary | ICD-10-CM

## 2014-06-12 DIAGNOSIS — Z125 Encounter for screening for malignant neoplasm of prostate: Secondary | ICD-10-CM | POA: Diagnosis not present

## 2014-06-12 DIAGNOSIS — R413 Other amnesia: Secondary | ICD-10-CM

## 2014-06-12 DIAGNOSIS — G8929 Other chronic pain: Secondary | ICD-10-CM

## 2014-06-12 LAB — LIPID PANEL
Cholesterol: 202 mg/dL — ABNORMAL HIGH (ref 0–200)
HDL: 47.4 mg/dL (ref >39.00)
LDL Cholesterol: 127 mg/dL — ABNORMAL HIGH (ref 0–99)
NONHDL: 154.6
Total CHOL/HDL Ratio: 4
Triglycerides: 138 mg/dL (ref 0.0–149.0)
VLDL: 27.6 mg/dL (ref 0.0–40.0)

## 2014-06-12 LAB — HEPATIC FUNCTION PANEL
ALBUMIN: 4.4 g/dL (ref 3.5–5.2)
ALK PHOS: 113 U/L (ref 39–117)
ALT: 32 U/L (ref 0–53)
AST: 28 U/L (ref 0–37)
Bilirubin, Direct: 0.1 mg/dL (ref 0.0–0.3)
Total Bilirubin: 0.6 mg/dL (ref 0.2–1.2)
Total Protein: 7.8 g/dL (ref 6.0–8.3)

## 2014-06-12 LAB — BASIC METABOLIC PANEL
BUN: 18 mg/dL (ref 6–23)
CALCIUM: 9.7 mg/dL (ref 8.4–10.5)
CO2: 27 meq/L (ref 19–32)
Chloride: 102 mEq/L (ref 96–112)
Creatinine, Ser: 0.99 mg/dL (ref 0.40–1.50)
GFR: 84.44 mL/min (ref >60.00)
Glucose, Bld: 82 mg/dL (ref 70–99)
Potassium: 4.3 mEq/L (ref 3.5–5.1)
SODIUM: 137 meq/L (ref 135–145)

## 2014-06-12 LAB — AMYLASE: AMYLASE: 37 U/L (ref 27–131)

## 2014-06-12 LAB — LIPASE: Lipase: 33 U/L (ref 11.0–59.0)

## 2014-06-12 LAB — PSA: PSA: 0.72 ng/mL (ref 0.10–4.00)

## 2014-06-12 NOTE — Progress Notes (Signed)
Pre visit review using our clinic review tool, if applicable. No additional management support is needed unless otherwise documented below in the visit note. 

## 2014-06-12 NOTE — Progress Notes (Signed)
Patient ID: [name], male   DOB: 01-Jul-1960, 54 y.o.   MRN: [id number]   Subjective:    Patient ID: [name], male    DOB: 1961-01-20, 54 y.o.   MRN: [id number]  HPI  Patient here for a scheduled follow up.  Stopped his adderall five weeks ago.  Heart rate and blood pressure better.  Feels better.  Remains off metoprolol.  Weight has decreased.  He is watching his diet.  Discussed exercise.  Stress is better.  Relationship going well. Trying to stay active.  Plans to start water exercise.     Past Medical History  Diagnosis Date  . Hypercholesterolemia   . Pancreatitis   . Chronic back pain     s/p 5 back surgeries  . Mild cognitive impairment with memory loss   . Anoxic brain injury   . Depression   . Allergy   . H/O acute pancreatitis   . Coronary artery disease     Cardiac catheterization in 2007 at [facility] showed 30% stenosis in proximal LAD. No other disease. Ejection fraction was 65%    Current Outpatient Prescriptions on File Prior to Visit  Medication Sig Dispense Refill  . cetirizine (ZYRTEC) 10 MG tablet Take 10 mg by mouth daily.    . diazepam (VALIUM) 5 MG tablet Take 5-10 mg by mouth every 8 (eight) hours as needed (pain).     . fluticasone (FLONASE) 50 MCG/ACT nasal spray Place 2 sprays into both nostrils daily. 48 g 1  . tiZANidine (ZANAFLEX) 4 MG tablet Take 4 mg by mouth 3 (three) times daily.    Marland Kitchen amphetamine-dextroamphetamine (ADDERALL) 20 MG tablet Take 20 mg by mouth 3 (three) times daily.     No current facility-administered medications on file prior to visit.    Review of Systems  Constitutional: Negative for appetite change and unexpected weight change (has adjusted diet and has lost some weight.).  HENT: Negative for congestion and sinus pressure.   Respiratory: Negative for cough, chest tightness and shortness of breath.   Cardiovascular: Negative for chest pain, palpitations and leg swelling.  Gastrointestinal: Negative for nausea,  vomiting, abdominal pain and diarrhea.  Musculoskeletal: Positive for back pain (chronic). Negative for joint swelling.  Skin: Negative for color change and rash.  Neurological: Negative for dizziness, light-headedness and headaches.  Psychiatric/Behavioral: Negative for dysphoric mood and agitation.       Objective:    Physical Exam  Constitutional: He appears well-developed and well-nourished. No distress.  HENT:  Nose: Nose normal.  Mouth/Throat: Oropharynx is clear and moist.  Neck: Neck supple. No thyromegaly present.  Cardiovascular: Normal rate and regular rhythm.   Pulmonary/Chest: Effort normal and breath sounds normal. No respiratory distress.  Abdominal: Soft. Bowel sounds are normal. There is no tenderness.  Musculoskeletal: He exhibits no edema.  Lymphadenopathy:    He has no cervical adenopathy.  Skin: No rash noted. No erythema.  Psychiatric: He has a normal mood and affect. His behavior is normal.    BP 120/80 mmHg  Pulse 73  Temp(Src) 98.6 F (37 C) (Oral)  Ht 6\' 1"  (1.854 m)  Wt 230 lb 4 oz (104.441 kg)  BMI 30.38 kg/m2  SpO2 97% Wt Readings from Last 3 Encounters:  06/12/14 230 lb 4 oz (104.441 kg)  03/14/14 235 lb (106.595 kg)  09/27/13 222 lb (100.699 kg)     Lab Results  Component Value Date   WBC 8.3 03/14/2014   HGB 16.2 03/14/2014  HCT 45.8 03/14/2014   PLT 310 03/14/2014   GLUCOSE 82 06/12/2014   CHOL 202* 06/12/2014   TRIG 138.0 06/12/2014   HDL 47.40 06/12/2014   LDLCALC 127* 06/12/2014   ALT 32 06/12/2014   AST 28 06/12/2014   NA 137 06/12/2014   K 4.3 06/12/2014   CL 102 06/12/2014   CREATININE 0.99 06/12/2014   BUN 18 06/12/2014   CO2 27 06/12/2014   TSH 1.184 03/14/2014   PSA 0.72 06/12/2014   INR 0.9 10/27/2011   HGBA1C 5.3 09/14/2012       Assessment & Plan:   Problem List Items Addressed This Visit    Chronic back pain    Being followed at pain clinic.  Receiving lidocaine injections.  Back stable.  Coming off  of some of his medications.        Health care maintenance    Schedule for a physical.  Colonoscopy 01/2011.  Check psa with labs.       History of pancreatitis - Primary    States felt to be related to his previous steroid injections.  No recent reoccurrence.  Request to have amylase and lipase rechecked today.  Follow.        Relevant Orders   Amylase (Completed)   Lipase (Completed)   Hypercholesteremia    Low cholesterol diet and exercise.  Follow lipid panel.       Relevant Orders   Lipid panel (Completed)   Hepatic function panel (Completed)   Hyponatremia    Recheck electrolytes today to confirm stable.       Relevant Orders   Basic metabolic panel (Completed)   Short-term memory loss    Has been followed at [facility] neurology.  Appears to be better off pain medications.  Stable.       Sinus tachycardia    Improved since stopping adderall.  Feels better.  Follow.         Other Visit Diagnoses    Prostate cancer screening        Relevant Orders    PSA (Completed)      I spent 25 minutes with the patient and more than 50% of the time was spent in consultation regarding the above.     Einar Pheasant, MD

## 2014-06-16 ENCOUNTER — Encounter: Payer: Self-pay | Admitting: Internal Medicine

## 2014-06-16 DIAGNOSIS — Z0000 Encounter for general adult medical examination without abnormal findings: Secondary | ICD-10-CM | POA: Insufficient documentation

## 2014-06-16 NOTE — Assessment & Plan Note (Signed)
States felt to be related to his previous steroid injections.  No recent reoccurrence.  Request to have amylase and lipase rechecked today.  Follow.

## 2014-06-16 NOTE — Assessment & Plan Note (Signed)
Being followed at pain clinic.  Receiving lidocaine injections.  Back stable.  Coming off of some of his medications.

## 2014-06-16 NOTE — Assessment & Plan Note (Signed)
Recheck electrolytes today to confirm stable.

## 2014-06-16 NOTE — Assessment & Plan Note (Signed)
Low cholesterol diet and exercise.  Follow lipid panel.   

## 2014-06-16 NOTE — Assessment & Plan Note (Signed)
Has been followed at [facility] neurology.  Appears to be better off pain medications.  Stable.

## 2014-06-16 NOTE — Assessment & Plan Note (Signed)
Schedule for a physical.  Colonoscopy 01/2011.  Check psa with labs.

## 2014-06-16 NOTE — Assessment & Plan Note (Signed)
Improved since stopping adderall.  Feels better.  Follow.

## 2014-06-27 ENCOUNTER — Other Ambulatory Visit: Payer: Self-pay | Admitting: Internal Medicine

## 2014-10-15 ENCOUNTER — Encounter (INDEPENDENT_AMBULATORY_CARE_PROVIDER_SITE_OTHER): Payer: Self-pay

## 2014-10-15 ENCOUNTER — Ambulatory Visit (INDEPENDENT_AMBULATORY_CARE_PROVIDER_SITE_OTHER): Payer: PPO | Admitting: Internal Medicine

## 2014-10-15 ENCOUNTER — Encounter: Payer: Self-pay | Admitting: Internal Medicine

## 2014-10-15 VITALS — BP 132/80 | HR 75 | Temp 97.9°F | Ht 73.0 in | Wt 228.0 lb

## 2014-10-15 DIAGNOSIS — E7800 Pure hypercholesterolemia, unspecified: Secondary | ICD-10-CM

## 2014-10-15 DIAGNOSIS — R413 Other amnesia: Secondary | ICD-10-CM | POA: Diagnosis not present

## 2014-10-15 DIAGNOSIS — Z23 Encounter for immunization: Secondary | ICD-10-CM | POA: Diagnosis not present

## 2014-10-15 DIAGNOSIS — I471 Supraventricular tachycardia: Secondary | ICD-10-CM

## 2014-10-15 DIAGNOSIS — M549 Dorsalgia, unspecified: Secondary | ICD-10-CM | POA: Diagnosis not present

## 2014-10-15 DIAGNOSIS — Z8719 Personal history of other diseases of the digestive system: Secondary | ICD-10-CM

## 2014-10-15 DIAGNOSIS — B353 Tinea pedis: Secondary | ICD-10-CM

## 2014-10-15 DIAGNOSIS — R000 Tachycardia, unspecified: Secondary | ICD-10-CM

## 2014-10-15 DIAGNOSIS — G8929 Other chronic pain: Secondary | ICD-10-CM

## 2014-10-15 NOTE — Progress Notes (Signed)
Patient ID: [name], male   DOB: 1960-09-01, 54 y.o.   MRN: [id number]   Subjective:    Patient ID: [name], male    DOB: [birth year], 54 y.o.   MRN: [id number]  HPI  Patient here for a scheduled follow up.  He is following up on his cholesterol, blood pressure and increased stress.  He sees Dr Toy Care.  Has been off adderall now for 10 days.  Was off previously and had to restart.  Feels better off, but does not feel he thinks as well off the medication.  Plans to discuss his concerns about the adderall with Dr Toy Care.  Has lost some weight.  No cardiac symptoms with increased activity or exertion.  No sob.  No abdominal pain or cramping.  No pancreas flares.  Bowels stable.  Is s/p trigger point injections.  Back doing better.  Some persistent problems with fungal infections on his feet.  Has used otc sprays.    Past Medical History  Diagnosis Date  . Hypercholesterolemia   . Pancreatitis   . Chronic back pain     s/p 5 back surgeries  . Mild cognitive impairment with memory loss   . Anoxic brain injury   . Depression   . Allergy   . H/O acute pancreatitis   . Coronary artery disease     Cardiac catheterization in 2007 at [facility] showed 30% stenosis in proximal LAD. No other disease. Ejection fraction was 65%   Past Surgical History  Procedure Laterality Date  . Cholecystectomy  2013  . Back surgery      multiple back surgeries (5)  . Vasectomy      With Reversal  . Spine surgery    . Vein surgery Right 2013    arm  . Cardiac catheterization      [facility]   . Trigger point injection     Family History  Problem Relation Age of Onset  . Heart disease Father   . Heart attack Father   . Hypertension    . Colon cancer    . Heart disease Mother   . Heart attack Mother   . Heart attack Brother     MI's x 2    Social History   Social History  . Marital Status: Married    Spouse Name: N/A  . Number of Children: 3  . Years of Education: N/A   Social History Main  Topics  . Smoking status: Never Smoker   . Smokeless tobacco: Never Used  . Alcohol Use: No  . Drug Use: No  . Sexual Activity: Not Asked   Other Topics Concern  . None   Social History Narrative    Outpatient Encounter Prescriptions as of 10/15/2014  Medication Sig  . amphetamine-dextroamphetamine (ADDERALL) 20 MG tablet Take 20 mg by mouth 3 (three) times daily.  . cetirizine (ZYRTEC) 10 MG tablet Take 10 mg by mouth daily.  . diazepam (VALIUM) 5 MG tablet Take 5-10 mg by mouth every 8 (eight) hours as needed (pain).   . fluticasone (FLONASE) 50 MCG/ACT nasal spray USE 2 SPRAYS IN EACH NOSTRIL EVERY DAY  . tiZANidine (ZANAFLEX) 4 MG tablet Take 4 mg by mouth 3 (three) times daily.   No facility-administered encounter medications on file as of 10/15/2014.    Review of Systems  Constitutional: Negative for appetite change and unexpected weight change.  HENT: Negative for congestion and sinus pressure.   Eyes: Negative for pain and visual disturbance.  Respiratory: Negative for cough, chest tightness and shortness of breath.   Cardiovascular: Negative for chest pain, palpitations and leg swelling.  Gastrointestinal: Negative for nausea, vomiting, abdominal pain and diarrhea.  Genitourinary: Negative for dysuria and difficulty urinating.  Musculoskeletal: Positive for back pain (chronic back pain.  stable. ). Negative for joint swelling.  Skin: Negative for color change and rash.  Neurological: Negative for dizziness, light-headedness and headaches.  Hematological: Negative for adenopathy. Does not bruise/bleed easily.  Psychiatric/Behavioral: Negative for dysphoric mood and agitation.       Objective:    Physical Exam  Constitutional: He appears well-developed and well-nourished. No distress.  HENT:  Nose: Nose normal.  Mouth/Throat: Oropharynx is clear and moist.  Eyes: Conjunctivae are normal. Right eye exhibits no discharge. Left eye exhibits no discharge.  Neck: Neck  supple. No thyromegaly present.  Cardiovascular: Normal rate and regular rhythm.   Pulmonary/Chest: Effort normal and breath sounds normal. No respiratory distress.  Abdominal: Soft. Bowel sounds are normal. There is no tenderness.  Musculoskeletal: He exhibits no edema or tenderness.  Lymphadenopathy:    He has no cervical adenopathy.  Skin: No rash noted. No erythema.  Some changes c/w fungal infection on her feet.    Psychiatric: He has a normal mood and affect. His behavior is normal.    BP 132/80 mmHg  Pulse 75  Temp(Src) 97.9 F (36.6 C) (Oral)  Ht 6\' 1"  (1.854 m)  Wt 228 lb (103.42 kg)  BMI 30.09 kg/m2  SpO2 96% Wt Readings from Last 3 Encounters:  10/15/14 228 lb (103.42 kg)  06/12/14 230 lb 4 oz (104.441 kg)  03/14/14 235 lb (106.595 kg)     Lab Results  Component Value Date   WBC 8.3 03/14/2014   HGB 16.2 03/14/2014   HCT 45.8 03/14/2014   PLT 310 03/14/2014   GLUCOSE 82 06/12/2014   CHOL 202* 06/12/2014   TRIG 138.0 06/12/2014   HDL 47.40 06/12/2014   LDLCALC 127* 06/12/2014   ALT 32 06/12/2014   AST 28 06/12/2014   NA 137 06/12/2014   K 4.3 06/12/2014   CL 102 06/12/2014   CREATININE 0.99 06/12/2014   BUN 18 06/12/2014   CO2 27 06/12/2014   TSH 1.184 03/14/2014   PSA 0.72 06/12/2014   INR 0.9 10/27/2011   HGBA1C 5.3 09/14/2012       Assessment & Plan:   Problem List Items Addressed This Visit    Chronic back pain    Being followed at pain clinic (Dr Claudia Desanctis).  Stable.        Fungal infection of foot    lotrisone cream as directed.  Keep feet dry.  Follow.        History of pancreatitis    No recent flares.  Follow.        Hypercholesteremia    Low cholesterol diet and exercise.  Follow lipid panel.        Relevant Orders   Lipid panel   Hepatic function panel   Basic metabolic panel   Short-term memory loss    Was worked up at [facility] neurology.  Stable.        Sinus tachycardia    Pulse rate is better.  Follow.           Other Visit Diagnoses    Encounter for immunization    -  Primary        Einar Pheasant, MD

## 2014-10-15 NOTE — Progress Notes (Signed)
Pre-visit discussion using our clinic review tool. No additional management support is needed unless otherwise documented below in the visit note.  

## 2014-10-15 NOTE — Patient Instructions (Signed)
Lotrimin cream - apply to affected area on the feet twice a day.

## 2014-10-18 ENCOUNTER — Encounter: Payer: Self-pay | Admitting: Internal Medicine

## 2014-10-18 DIAGNOSIS — B353 Tinea pedis: Secondary | ICD-10-CM | POA: Insufficient documentation

## 2014-10-18 NOTE — Assessment & Plan Note (Signed)
Pulse rate is better.  Follow.

## 2014-10-18 NOTE — Assessment & Plan Note (Signed)
Was worked up at [facility] neurology.  Stable.

## 2014-10-18 NOTE — Assessment & Plan Note (Signed)
lotrisone cream as directed.  Keep feet dry.  Follow.

## 2014-10-18 NOTE — Assessment & Plan Note (Signed)
No recent flares.  Follow.  

## 2014-10-18 NOTE — Assessment & Plan Note (Signed)
Being followed at pain clinic (Dr Claudia Desanctis).  Stable.

## 2014-10-18 NOTE — Assessment & Plan Note (Signed)
Low cholesterol diet and exercise.  Follow lipid panel.   

## 2014-10-30 ENCOUNTER — Other Ambulatory Visit (INDEPENDENT_AMBULATORY_CARE_PROVIDER_SITE_OTHER): Payer: PPO

## 2014-10-30 DIAGNOSIS — E7800 Pure hypercholesterolemia, unspecified: Secondary | ICD-10-CM

## 2014-10-30 LAB — BASIC METABOLIC PANEL
BUN: 17 mg/dL (ref 6–23)
CHLORIDE: 103 meq/L (ref 96–112)
CO2: 27 mEq/L (ref 19–32)
CREATININE: 1.09 mg/dL (ref 0.40–1.50)
Calcium: 9.7 mg/dL (ref 8.4–10.5)
GFR: 75.45 mL/min (ref >60.00)
Glucose, Bld: 97 mg/dL (ref 70–99)
Potassium: 4.6 mEq/L (ref 3.5–5.1)
SODIUM: 137 meq/L (ref 135–145)

## 2014-10-30 LAB — LIPID PANEL
CHOL/HDL RATIO: 4
Cholesterol: 178 mg/dL (ref 0–200)
HDL: 41.9 mg/dL (ref >39.00)
LDL CALC: 105 mg/dL — AB (ref 0–99)
NonHDL: 135.86
TRIGLYCERIDES: 154 mg/dL — AB (ref 0.0–149.0)
VLDL: 30.8 mg/dL (ref 0.0–40.0)

## 2014-10-30 LAB — HEPATIC FUNCTION PANEL
ALBUMIN: 4.4 g/dL (ref 3.5–5.2)
ALK PHOS: 125 U/L — AB (ref 39–117)
ALT: 45 U/L (ref 0–53)
AST: 28 U/L (ref 0–37)
Bilirubin, Direct: 0.1 mg/dL (ref 0.0–0.3)
TOTAL PROTEIN: 8.1 g/dL (ref 6.0–8.3)
Total Bilirubin: 0.6 mg/dL (ref 0.2–1.2)

## 2014-10-31 ENCOUNTER — Encounter: Payer: Self-pay | Admitting: *Deleted

## 2014-10-31 ENCOUNTER — Other Ambulatory Visit: Payer: Self-pay | Admitting: Internal Medicine

## 2014-10-31 DIAGNOSIS — R748 Abnormal levels of other serum enzymes: Secondary | ICD-10-CM

## 2014-10-31 NOTE — Progress Notes (Signed)
Order placed for f/u liver panel.  

## 2014-11-27 ENCOUNTER — Telehealth: Payer: Self-pay | Admitting: *Deleted

## 2014-11-27 ENCOUNTER — Other Ambulatory Visit (INDEPENDENT_AMBULATORY_CARE_PROVIDER_SITE_OTHER): Payer: PPO

## 2014-11-27 DIAGNOSIS — R748 Abnormal levels of other serum enzymes: Secondary | ICD-10-CM | POA: Diagnosis not present

## 2014-11-27 LAB — HEPATIC FUNCTION PANEL
ALK PHOS: 129 U/L — AB (ref 39–117)
ALT: 26 U/L (ref 0–53)
AST: 25 U/L (ref 0–37)
Albumin: 4.7 g/dL (ref 3.5–5.2)
BILIRUBIN DIRECT: 0.1 mg/dL (ref 0.0–0.3)
TOTAL PROTEIN: 8.3 g/dL (ref 6.0–8.3)
Total Bilirubin: 0.7 mg/dL (ref 0.2–1.2)

## 2014-11-27 LAB — LIPASE: Lipase: 71 U/L — ABNORMAL HIGH (ref 11.0–59.0)

## 2014-11-27 NOTE — Addendum Note (Signed)
Addended by: Karlene Einstein D on: 11/27/2014 03:45 PM   Modules accepted: Orders

## 2014-11-27 NOTE — Telephone Encounter (Signed)
Order placed for lipase.  Was he having any problems?

## 2014-11-27 NOTE — Telephone Encounter (Signed)
Pt would like to check his lipase ?

## 2014-11-27 NOTE — Telephone Encounter (Signed)
He stated he has felt very tired and weak

## 2014-12-02 ENCOUNTER — Telehealth: Payer: Self-pay | Admitting: Internal Medicine

## 2014-12-02 DIAGNOSIS — R079 Chest pain, unspecified: Secondary | ICD-10-CM

## 2014-12-02 DIAGNOSIS — I25119 Atherosclerotic heart disease of native coronary artery with unspecified angina pectoris: Secondary | ICD-10-CM

## 2014-12-02 NOTE — Telephone Encounter (Signed)
Order placed for cardiology referral.   

## 2014-12-02 NOTE — Telephone Encounter (Signed)
Called and spoke to pt.  He is feeling better now.  With intermittent chest pain.  Went to ER.  Recommended cardiology f/u/stress test.  Order placed for cardiology referral.  Pt comfortable with this plan.  Knows if any change or worsening symptoms - needs reevaluation.

## 2014-12-02 NOTE — Telephone Encounter (Signed)
Pt called about being seen at [facility] ED, pt was told he needs to do a stress test. Pt is still not feeling well pt would like to know what to do? Thank you!

## 2014-12-02 NOTE — Telephone Encounter (Signed)
Went to [facility] ED for ABD pain/ chest pain, Please advise?

## 2014-12-12 ENCOUNTER — Other Ambulatory Visit: Payer: Self-pay

## 2014-12-12 MED ORDER — FLUTICASONE PROPIONATE 50 MCG/ACT NA SUSP: 2.0000 | Freq: Every day | NASAL | Status: DC

## 2014-12-15 ENCOUNTER — Emergency Department
Admission: EM | Admit: 2014-12-15 | Discharge: 2014-12-15 | Disposition: A | Discharge status: Home / Self Care | Payer: No Typology Code available for payment source | Attending: Emergency Medicine | Admitting: Emergency Medicine

## 2014-12-15 ENCOUNTER — Emergency Department: Payer: No Typology Code available for payment source

## 2014-12-15 ENCOUNTER — Encounter: Payer: Self-pay | Admitting: Emergency Medicine

## 2014-12-15 DIAGNOSIS — M62830 Muscle spasm of back: Secondary | ICD-10-CM | POA: Diagnosis not present

## 2014-12-15 DIAGNOSIS — M5441 Lumbago with sciatica, right side: Secondary | ICD-10-CM | POA: Diagnosis not present

## 2014-12-15 DIAGNOSIS — S3992XA Unspecified injury of lower back, initial encounter: Secondary | ICD-10-CM | POA: Insufficient documentation

## 2014-12-15 DIAGNOSIS — Y92410 Unspecified street and highway as the place of occurrence of the external cause: Secondary | ICD-10-CM | POA: Insufficient documentation

## 2014-12-15 DIAGNOSIS — Y998 Other external cause status: Secondary | ICD-10-CM | POA: Insufficient documentation

## 2014-12-15 DIAGNOSIS — Z7951 Long term (current) use of inhaled steroids: Secondary | ICD-10-CM | POA: Diagnosis not present

## 2014-12-15 DIAGNOSIS — Z880 Allergy status to penicillin: Secondary | ICD-10-CM | POA: Insufficient documentation

## 2014-12-15 DIAGNOSIS — Y9389 Activity, other specified: Secondary | ICD-10-CM | POA: Insufficient documentation

## 2014-12-15 DIAGNOSIS — S299XXA Unspecified injury of thorax, initial encounter: Secondary | ICD-10-CM | POA: Diagnosis not present

## 2014-12-15 DIAGNOSIS — Z79899 Other long term (current) drug therapy: Secondary | ICD-10-CM | POA: Diagnosis not present

## 2014-12-15 DIAGNOSIS — S199XXA Unspecified injury of neck, initial encounter: Secondary | ICD-10-CM | POA: Insufficient documentation

## 2014-12-15 MED ORDER — CYCLOBENZAPRINE HCL 10 MG PO TABS: 10.0000 mg | ORAL_TABLET | Freq: Three times a day (TID) | ORAL | Status: DC | PRN

## 2014-12-15 MED ORDER — OXYCODONE-ACETAMINOPHEN 5-325 MG PO TABS
1.0000 | ORAL_TABLET | Freq: Once | ORAL | Status: AC
  Administered 2014-12-15: 1 via ORAL
  Filled 2014-12-15: qty 1

## 2014-12-15 MED ORDER — OXYCODONE-ACETAMINOPHEN 5-325 MG PO TABS: 1.0000 | ORAL_TABLET | Freq: Four times a day (QID) | ORAL | Status: DC | PRN

## 2014-12-15 MED ORDER — MELOXICAM 15 MG PO TABS: 15.0000 mg | ORAL_TABLET | Freq: Every day | ORAL | Status: DC

## 2014-12-15 MED ORDER — ONDANSETRON 8 MG PO TBDP
8.0000 mg | ORAL_TABLET | Freq: Once | ORAL | Status: AC
  Administered 2014-12-15: 8 mg via ORAL
  Filled 2014-12-15: qty 1
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
## 2015-02-10 ENCOUNTER — Encounter: Payer: PPO | Admitting: Internal Medicine

## 2015-02-24 ENCOUNTER — Encounter: Payer: Self-pay | Admitting: Internal Medicine

## 2015-02-24 ENCOUNTER — Ambulatory Visit (INDEPENDENT_AMBULATORY_CARE_PROVIDER_SITE_OTHER): Payer: PPO | Admitting: Internal Medicine

## 2015-02-24 VITALS — BP 110/80 | HR 89 | Temp 98.1°F | Resp 18 | Ht 73.0 in | Wt 237.1 lb

## 2015-02-24 DIAGNOSIS — D649 Anemia, unspecified: Secondary | ICD-10-CM | POA: Diagnosis not present

## 2015-02-24 DIAGNOSIS — Z8719 Personal history of other diseases of the digestive system: Secondary | ICD-10-CM | POA: Diagnosis not present

## 2015-02-24 DIAGNOSIS — M549 Dorsalgia, unspecified: Secondary | ICD-10-CM

## 2015-02-24 DIAGNOSIS — G8929 Other chronic pain: Secondary | ICD-10-CM

## 2015-02-24 DIAGNOSIS — Z0000 Encounter for general adult medical examination without abnormal findings: Secondary | ICD-10-CM

## 2015-02-24 DIAGNOSIS — IMO0001 Reserved for inherently not codable concepts without codable children: Secondary | ICD-10-CM

## 2015-02-24 DIAGNOSIS — E7800 Pure hypercholesterolemia, unspecified: Secondary | ICD-10-CM

## 2015-02-24 DIAGNOSIS — R413 Other amnesia: Secondary | ICD-10-CM

## 2015-02-24 DIAGNOSIS — R030 Elevated blood-pressure reading, without diagnosis of hypertension: Secondary | ICD-10-CM

## 2015-02-24 NOTE — Progress Notes (Signed)
Pre-visit discussion using our clinic review tool. No additional management support is needed unless otherwise documented below in the visit note.  
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
## 2015-03-01 ENCOUNTER — Encounter: Payer: Self-pay | Admitting: Internal Medicine

## 2015-03-01 DIAGNOSIS — I10 Essential (primary) hypertension: Secondary | ICD-10-CM | POA: Insufficient documentation

## 2015-03-01 NOTE — Assessment & Plan Note (Signed)
Blood pressure on recheck revealed diastolic Q000111Q.  Have him spot check his pressure.  Follow.  Get him back in to recheck.  Check metabolic panel.

## 2015-03-01 NOTE — Assessment & Plan Note (Signed)
Low cholesterol diet and exercise.  Follow lipid panel.   

## 2015-03-01 NOTE — Assessment & Plan Note (Signed)
Follow cbc.  

## 2015-03-01 NOTE — Assessment & Plan Note (Signed)
Physical today 02/24/15.  Colonoscopy 01/2011.  06/12/14 - psa .72.

## 2015-03-01 NOTE — Assessment & Plan Note (Signed)
Worked up by [facility] neurology.  Overall stable.

## 2015-03-01 NOTE — Assessment & Plan Note (Signed)
Being followed at the pain clinic.  Stable.

## 2015-03-01 NOTE — Assessment & Plan Note (Signed)
No recent flares.  Check lipase.

## 2015-03-26 DIAGNOSIS — M75122 Complete rotator cuff tear or rupture of left shoulder, not specified as traumatic: Secondary | ICD-10-CM | POA: Insufficient documentation

## 2015-04-08 ENCOUNTER — Telehealth: Payer: Self-pay

## 2015-04-08 ENCOUNTER — Ambulatory Visit (INDEPENDENT_AMBULATORY_CARE_PROVIDER_SITE_OTHER): Payer: PPO | Admitting: Family Medicine

## 2015-04-08 ENCOUNTER — Ambulatory Visit
Admission: RE | Admit: 2015-04-08 | Discharge: 2015-04-08 | Disposition: A | Discharge status: Home / Self Care | Payer: PPO | Source: Ambulatory Visit | Attending: Family Medicine | Admitting: Family Medicine

## 2015-04-08 ENCOUNTER — Encounter: Payer: Self-pay | Admitting: Family Medicine

## 2015-04-08 VITALS — BP 134/82 | HR 98 | Temp 98.3°F | Ht 73.0 in | Wt 235.6 lb

## 2015-04-08 DIAGNOSIS — M25562 Pain in left knee: Secondary | ICD-10-CM

## 2015-04-08 DIAGNOSIS — M1712 Unilateral primary osteoarthritis, left knee: Secondary | ICD-10-CM | POA: Diagnosis not present

## 2015-04-08 NOTE — Progress Notes (Signed)
Patient ID: [name], male   DOB: 02/02/1960, 55 y.o.   MRN: [id number]  Tommi Rumps, MD Phone: [phone]  [name] is a 55 y.o. male who presents today for same day visit.  Left knee pain: Patient notes 2-1/2 weeks ago he hurt his left knee. He is unsure of the specific mechanism of injury. Notes it has progressively gotten worse. Has had intermittent swelling with this. Fluid around it. Hurts to walk. 45 days ago he notes his left knee gave out on him and he fell. No locking or catching. No erythema, warmth, or fevers. Notes the knee does feel weak. He notes he has chronic lateral left leg numbness relating to back surgeries in the past. No new numbness. Notes he is due to have shoulder surgery this coming week and is currently on Vicodin for this. Notes he is also taking Tylenol and Zanaflex. Notes these are not touching the pain.  PMH: nonsmoker.   ROS see history of present illness  Objective  Physical Exam Filed Vitals:   04/08/15 1107  BP: 134/82  Pulse: 98  Temp: 98.3 F (36.8 C)    BP Readings from Last 3 Encounters:  04/08/15 134/82  02/24/15 110/80  12/15/14 168/104   Wt Readings from Last 3 Encounters:  04/08/15 235 lb 9.6 oz (106.867 kg)  02/24/15 237 lb 2 oz (107.559 kg)  12/15/14 228 lb (103.42 kg)    Physical Exam  Constitutional: He is well-developed, well-nourished, and in no distress.  Cardiovascular: Normal rate, regular rhythm and normal heart sounds.   Pulmonary/Chest: Effort normal and breath sounds normal.  Musculoskeletal:  Left knee with minimal effusion, medial joint line tenderness, no lateral joint line tenderness, no bony tenderness, no ligamentous laxity, has discomfort on McMurray's though no noted clunking or popping Right knee with no effusion, joint line tenderness, bony tenderness, ligamentous laxity, and negative McMurray's Feet are warm  Neurological: He is alert.  5 out of 5 strength bilateral quads, hamstrings,  plantar flexion, and dorsiflexion, sensation to light touch decreased lateral left leg, otherwise normal and patient reports this is at his baseline, 2+ patellar reflexes  Skin: Skin is warm and dry. He is not diaphoretic.     Assessment/Plan: Please see individual problem list.  Left knee pain Patient with possible left knee injury 2 and half weeks ago though could've also injured it 45 days ago when it gave out. Does have some medial joint line tenderness suspicious for meniscal injury. Neurologically intact. Feet are warm. No signs of infection in the knee. Vital signs are stable. We will obtain x-ray imaging of the left knee. We will refer to sports medicine for consideration of ultrasound versus MRI. Ibuprofen over-the-counter 600 mg every 8 hours as needed. Advised to keep with food. Continue other pain medication. Offered crutches, though he declined these. Given return precautions.    Orders Placed This Encounter  Procedures  . DG Knee Complete 4 Views Left    Standing Status: Future     Number of Occurrences:      Standing Expiration Date: 06/07/2016    Order Specific Question:  Reason for Exam (SYMPTOM  OR DIAGNOSIS REQUIRED)    Answer:  left knee pain for 2.5 weeks, reported swelling    Order Specific Question:  Preferred imaging location?    Answer:  [facility]  . Ambulatory referral to Sports Medicine    Referral Priority:  Routine    Referral Type:  Consultation    Number  of Visits Requested:  [place], MD [place]

## 2015-04-08 NOTE — Telephone Encounter (Signed)
Pt is requesting xray results. Please advise, thanks

## 2015-04-08 NOTE — Assessment & Plan Note (Addendum)
Patient with possible left knee injury 2 and half weeks ago though could've also injured it 45 days ago when it gave out. Does have some medial joint line tenderness suspicious for meniscal injury. Neurologically intact. Feet are warm. No signs of infection in the knee. Vital signs are stable. We will obtain x-ray imaging of the left knee. We will refer to sports medicine for consideration of ultrasound versus MRI. Ibuprofen over-the-counter 600 mg every 8 hours as needed. Advised to keep with food. Continue other pain medication. Offered crutches, though he declined these. Given return precautions.

## 2015-04-08 NOTE — Progress Notes (Signed)
Pre visit review using our clinic review tool, if applicable. No additional management support is needed unless otherwise documented below in the visit note. 

## 2015-04-08 NOTE — Patient Instructions (Addendum)
Nice to meet you. The concern with your left knee is that you have injured your meniscus. We will get you to see a sports medicine physician to hopefully get an ultrasound performed. You should go get the x-rays of your left knee today as well. You can take ibuprofen 600 mg every 8 hours as needed for discomfort. He should take this with food as it can upset your stomach. If you develop worsening pain, numbness, weakness, swelling, redness, warmth, fevers, or any new or changing symptoms please seek medical attention.

## 2015-04-09 NOTE — Telephone Encounter (Signed)
Already addressed by Dr Caryl Bis.  See result note and xray report.

## 2015-04-10 ENCOUNTER — Encounter: Payer: Self-pay | Admitting: Family Medicine

## 2015-04-10 ENCOUNTER — Ambulatory Visit (INDEPENDENT_AMBULATORY_CARE_PROVIDER_SITE_OTHER): Payer: PPO | Admitting: Family Medicine

## 2015-04-10 VITALS — BP 132/86 | Ht 73.0 in | Wt 234.0 lb

## 2015-04-10 DIAGNOSIS — M25562 Pain in left knee: Secondary | ICD-10-CM

## 2015-04-10 NOTE — Patient Instructions (Addendum)
I'm concerned that you tore your medial meniscus and ACL in this knee. Ice the knee 15 minutes at a time 3-4 times a day. Hold the ibuprofen until after your surgery - you could call the surgeon to see if they are ok with you taking this now - many surgeons want you off of this class of medicines before surgery though. Oxycodone 1-2 tabs every 6 hours as needed. ACE wrap or knee brace for support and swelling. Elevate above your heart as much as possible. Cane, crutches, or walker to help with stability. We will call you with the results of the MRI and next steps.   MRI Left Knee appointment is: [facility] Thursday 04/30/15 at [place] [name]

## 2015-04-14 ENCOUNTER — Encounter: Payer: Self-pay | Admitting: Family Medicine

## 2015-04-14 NOTE — Assessment & Plan Note (Signed)
with ultrasound only able to visualize anterior half of meniscus.  Exam consistent with at least medial meniscus tear however.  Does have laxity of ACL as well though with an end point (more laxity than right knee).  Advised we move forward with MRI to further assess.  In meantime icing, ACE wrap, elevation, quad strengthening exercises.  Will call him with MRI results and next steps.

## 2015-04-14 NOTE — Progress Notes (Addendum)
PCP: Einar Pheasant, MD  Consultation requested by Dr. Caryl Bis  Subjective:   HPI: Patient is a 55 y.o. male here for left knee injury.  Patient reports he was simply walking about 1 1/2 months ago when his left knee gave out. + swelling. Pain worsened over past 4 1/2 weeks. Pain level 9/10 anterior knee, sharp. Taking ibuprofen and percocet for his shoulder - having surgery on Monday for this. No skin changes, fever.  Past Medical History  Diagnosis Date  . Hypercholesterolemia   . Pancreatitis   . Chronic back pain     s/p 5 back surgeries  . Mild cognitive impairment with memory loss   . Anoxic brain injury ([place])   . Depression   . Allergy   . H/O acute pancreatitis   . Coronary artery disease     Cardiac catheterization in 2007 at [facility] showed 30% stenosis in proximal LAD. No other disease. Ejection fraction was 65%    Current Outpatient Prescriptions on File Prior to Visit  Medication Sig Dispense Refill  . amLODipine (NORVASC) 5 MG tablet Take by mouth.    Marland Kitchen amphetamine-dextroamphetamine (ADDERALL) 20 MG tablet Take 20 mg by mouth 3 (three) times daily.    . cetirizine (ZYRTEC) 10 MG tablet Take 10 mg by mouth daily.    . cyclobenzaprine (FLEXERIL) 10 MG tablet Take 1 tablet (10 mg total) by mouth 3 (three) times daily as needed for muscle spasms. (Patient not taking: Reported on 04/08/2015) 15 tablet 0  . diazepam (VALIUM) 5 MG tablet Take 5-10 mg by mouth every 8 (eight) hours as needed (pain). Reported on 04/08/2015    . fluticasone (FLONASE) 50 MCG/ACT nasal spray Place 2 sprays into both nostrils daily. 48 g 1  . oxyCODONE-acetaminophen (ROXICET) 5-325 MG tablet Take 1 tablet by mouth every 6 (six) hours as needed for severe pain. 20 tablet 0  . tiZANidine (ZANAFLEX) 4 MG tablet Take 4 mg by mouth 3 (three) times daily.    Marland Kitchen zolpidem (AMBIEN) 10 MG tablet Take 10 mg by mouth daily.  2   No current facility-administered medications on file prior to visit.    Past  Surgical History  Procedure Laterality Date  . Cholecystectomy  2013  . Back surgery      multiple back surgeries (5)  . Vasectomy      With Reversal  . Spine surgery    . Vein surgery Right 2013    arm  . Cardiac catheterization      [facility]   . Trigger point injection      Allergies  Allergen Reactions  . Penicillins Anaphylaxis  . Sulfa Antibiotics     Social History   Social History  . Marital Status: Married    Spouse Name: N/A  . Number of Children: 3  . Years of Education: N/A   Occupational History  . Not on file.   Social History Main Topics  . Smoking status: Never Smoker   . Smokeless tobacco: Never Used  . Alcohol Use: No  . Drug Use: No  . Sexual Activity: Not on file   Other Topics Concern  . Not on file   Social History Narrative    Family History  Problem Relation Age of Onset  . Heart disease Father   . Heart attack Father   . Hypertension    . Colon cancer    . Heart disease Mother   . Heart attack Mother   . Heart attack Brother  MI's x 2     BP 132/86 mmHg  Ht 6\' 1"  (1.854 m)  Wt 234 lb (106.142 kg)  BMI 30.88 kg/m2  Review of Systems: See HPI above.    Objective:  Physical Exam:  Gen: NAD, comfortable in exam room  Left knee: No gross deformity, ecchymoses, effusion. Medial joint line tenderness. FROM. 1+ ant drawer.  Negative post drawer.  Negative valgus/varus testing. Negative lachmanns. Positive mcmurrays, apleys, negative patellar apprehension. NV intact distally.  MSK u/s:  Visible medial and lateral meniscus intact left knee.  Patellar, quad tendons intact.  No effusion.   Assessment & Plan:  1. Left knee injury - with ultrasound only able to visualize anterior half of meniscus.  Exam consistent with at least medial meniscus tear however.  Does have laxity of ACL as well though with an end point (more laxity than right knee).  Advised we move forward with MRI to further assess.  In meantime icing, ACE wrap,  elevation, quad strengthening exercises.  Will call him with MRI results and next steps.  Addendum:  MRI reviewed and discussed with patient.  See my mychart note for more details on what we discussed.  He is s/p shoulder surgery and is focused on intensive rehab for this - brace has helped knee a lot.  He is going to hold off on injection or physical therapy for this for now.  He will call us in the future if he would like to pursue either of these.

## 2015-04-18 ENCOUNTER — Other Ambulatory Visit (HOSPITAL_BASED_OUTPATIENT_CLINIC_OR_DEPARTMENT_OTHER): Payer: PPO

## 2015-04-28 ENCOUNTER — Other Ambulatory Visit: Payer: PPO

## 2015-04-30 ENCOUNTER — Other Ambulatory Visit (INDEPENDENT_AMBULATORY_CARE_PROVIDER_SITE_OTHER): Payer: PPO

## 2015-04-30 ENCOUNTER — Ambulatory Visit: Payer: PPO | Attending: Orthopedic Surgery | Admitting: Physical Therapy

## 2015-04-30 ENCOUNTER — Ambulatory Visit
Admission: RE | Admit: 2015-04-30 | Discharge: 2015-04-30 | Disposition: A | Discharge status: Home / Self Care | Payer: PPO | Source: Ambulatory Visit | Attending: Family Medicine | Admitting: Family Medicine

## 2015-04-30 ENCOUNTER — Ambulatory Visit: Payer: PPO | Admitting: Physical Therapy

## 2015-04-30 ENCOUNTER — Encounter: Payer: Self-pay | Admitting: Physical Therapy

## 2015-04-30 DIAGNOSIS — M25462 Effusion, left knee: Secondary | ICD-10-CM | POA: Insufficient documentation

## 2015-04-30 DIAGNOSIS — Z8719 Personal history of other diseases of the digestive system: Secondary | ICD-10-CM | POA: Diagnosis not present

## 2015-04-30 DIAGNOSIS — M25562 Pain in left knee: Secondary | ICD-10-CM | POA: Insufficient documentation

## 2015-04-30 DIAGNOSIS — G8918 Other acute postprocedural pain: Secondary | ICD-10-CM | POA: Insufficient documentation

## 2015-04-30 DIAGNOSIS — IMO0001 Reserved for inherently not codable concepts without codable children: Secondary | ICD-10-CM

## 2015-04-30 DIAGNOSIS — D649 Anemia, unspecified: Secondary | ICD-10-CM

## 2015-04-30 DIAGNOSIS — R29898 Other symptoms and signs involving the musculoskeletal system: Secondary | ICD-10-CM | POA: Diagnosis present

## 2015-04-30 DIAGNOSIS — R030 Elevated blood-pressure reading, without diagnosis of hypertension: Secondary | ICD-10-CM

## 2015-04-30 DIAGNOSIS — E7800 Pure hypercholesterolemia, unspecified: Secondary | ICD-10-CM

## 2015-04-30 DIAGNOSIS — M25862 Other specified joint disorders, left knee: Secondary | ICD-10-CM | POA: Insufficient documentation

## 2015-04-30 DIAGNOSIS — M25511 Pain in right shoulder: Secondary | ICD-10-CM | POA: Diagnosis not present

## 2015-04-30 LAB — CBC WITH DIFFERENTIAL/PLATELET
BASOS ABS: 0.1 10*3/uL (ref 0.0–0.1)
Basophils Relative: 0.9 % (ref 0.0–3.0)
EOS ABS: 0.8 10*3/uL — AB (ref 0.0–0.7)
Eosinophils Relative: 9.9 % — ABNORMAL HIGH (ref 0.0–5.0)
HCT: 43.6 % (ref 39.0–52.0)
HEMOGLOBIN: 15 g/dL (ref 13.0–17.0)
LYMPHS PCT: 24.8 % (ref 12.0–46.0)
Lymphs Abs: 2.1 10*3/uL (ref 0.7–4.0)
MCHC: 34.5 g/dL (ref 30.0–36.0)
MCV: 90.4 fl (ref 78.0–100.0)
Monocytes Absolute: 0.5 10*3/uL (ref 0.1–1.0)
Monocytes Relative: 6.3 % (ref 3.0–12.0)
NEUTROS ABS: 5 10*3/uL (ref 1.4–7.7)
Neutrophils Relative %: 58.1 % (ref 43.0–77.0)
PLATELETS: 320 10*3/uL (ref 150.0–400.0)
RBC: 4.82 Mil/uL (ref 4.22–5.81)
RDW: 13 % (ref 11.5–15.5)
WBC: 8.5 10*3/uL (ref 4.0–10.5)

## 2015-04-30 LAB — BASIC METABOLIC PANEL
BUN: 20 mg/dL (ref 6–23)
CALCIUM: 9.6 mg/dL (ref 8.4–10.5)
CO2: 23 meq/L (ref 19–32)
Chloride: 106 mEq/L (ref 96–112)
Creatinine, Ser: 0.98 mg/dL (ref 0.40–1.50)
GFR: 85.14 mL/min (ref >60.00)
GLUCOSE: 95 mg/dL (ref 70–99)
POTASSIUM: 3.7 meq/L (ref 3.5–5.1)
SODIUM: 138 meq/L (ref 135–145)

## 2015-04-30 LAB — HEPATIC FUNCTION PANEL
ALK PHOS: 166 U/L — AB (ref 39–117)
ALT: 104 U/L — AB (ref 0–53)
AST: 41 U/L — AB (ref 0–37)
Albumin: 4.2 g/dL (ref 3.5–5.2)
BILIRUBIN DIRECT: 0 mg/dL (ref 0.0–0.3)
BILIRUBIN TOTAL: 0.4 mg/dL (ref 0.2–1.2)
Total Protein: 7.6 g/dL (ref 6.0–8.3)

## 2015-04-30 LAB — LIPID PANEL
CHOLESTEROL: 157 mg/dL (ref 0–200)
HDL: 30.6 mg/dL — ABNORMAL LOW (ref >39.00)
LDL Cholesterol: 93 mg/dL (ref 0–99)
NONHDL: 126.83
Total CHOL/HDL Ratio: 5
Triglycerides: 170 mg/dL — ABNORMAL HIGH (ref 0.0–149.0)
VLDL: 34 mg/dL (ref 0.0–40.0)

## 2015-04-30 LAB — TSH: TSH: 1.42 u[IU]/mL (ref 0.35–4.50)

## 2015-04-30 LAB — LIPASE: LIPASE: 50 U/L (ref 11.0–59.0)

## 2015-05-01 ENCOUNTER — Ambulatory Visit (INDEPENDENT_AMBULATORY_CARE_PROVIDER_SITE_OTHER): Payer: PPO | Admitting: Internal Medicine

## 2015-05-01 ENCOUNTER — Encounter: Payer: Self-pay | Admitting: Family Medicine

## 2015-05-01 ENCOUNTER — Encounter: Payer: Self-pay | Admitting: Internal Medicine

## 2015-05-01 ENCOUNTER — Encounter: Payer: Self-pay | Admitting: Physical Therapy

## 2015-05-01 ENCOUNTER — Ambulatory Visit: Payer: PPO | Admitting: Physical Therapy

## 2015-05-01 ENCOUNTER — Telehealth: Payer: Self-pay | Admitting: *Deleted

## 2015-05-01 VITALS — BP 120/80 | HR 83 | Temp 98.3°F | Resp 18 | Ht 73.0 in | Wt 232.0 lb

## 2015-05-01 DIAGNOSIS — M25562 Pain in left knee: Secondary | ICD-10-CM | POA: Diagnosis not present

## 2015-05-01 DIAGNOSIS — R7989 Other specified abnormal findings of blood chemistry: Secondary | ICD-10-CM

## 2015-05-01 DIAGNOSIS — R030 Elevated blood-pressure reading, without diagnosis of hypertension: Secondary | ICD-10-CM

## 2015-05-01 DIAGNOSIS — R110 Nausea: Secondary | ICD-10-CM

## 2015-05-01 DIAGNOSIS — E7800 Pure hypercholesterolemia, unspecified: Secondary | ICD-10-CM

## 2015-05-01 DIAGNOSIS — M25511 Pain in right shoulder: Secondary | ICD-10-CM | POA: Diagnosis not present

## 2015-05-01 DIAGNOSIS — M549 Dorsalgia, unspecified: Secondary | ICD-10-CM

## 2015-05-01 DIAGNOSIS — Z8719 Personal history of other diseases of the digestive system: Secondary | ICD-10-CM

## 2015-05-01 DIAGNOSIS — R29898 Other symptoms and signs involving the musculoskeletal system: Secondary | ICD-10-CM

## 2015-05-01 DIAGNOSIS — R945 Abnormal results of liver function studies: Secondary | ICD-10-CM

## 2015-05-01 DIAGNOSIS — G8918 Other acute postprocedural pain: Secondary | ICD-10-CM

## 2015-05-01 DIAGNOSIS — G8929 Other chronic pain: Secondary | ICD-10-CM

## 2015-05-01 DIAGNOSIS — IMO0001 Reserved for inherently not codable concepts without codable children: Secondary | ICD-10-CM

## 2015-05-01 MED ORDER — ESOMEPRAZOLE MAGNESIUM 40 MG PO CPDR: 40.0000 mg | DELAYED_RELEASE_CAPSULE | Freq: Every day | ORAL | Status: DC

## 2015-05-01 NOTE — Telephone Encounter (Signed)
[name], please call and schedule. thanks

## 2015-05-01 NOTE — Therapy (Signed)
[place] [facility] [address], [place], [address] Phone: [phone]   Fax:  [phone]  Physical Therapy Treatment  Patient Details  Name: [name] MRN: [id number] Date of Birth: May 19, 1960 Referring Provider: Thomasene Lot. Johna Roles M.D.  Encounter Date: 05/01/2015      PT End of Session - 05/01/15 1226    Visit Number 2   Number of Visits 12   Date for PT Re-Evaluation 05/28/15   PT Start Time 0844   PT Stop Time 0930   PT Time Calculation (min) 46 min   Equipment Utilized During Treatment Other (comment)  Arm sling   Activity Tolerance Patient tolerated treatment well;Patient limited by pain   Behavior During Therapy [facility] for tasks assessed/performed      Past Medical History  Diagnosis Date  . Hypercholesterolemia   . Pancreatitis   . Chronic back pain     s/p 5 back surgeries  . Mild cognitive impairment with memory loss   . Anoxic brain injury ([place])   . Depression   . Allergy   . H/O acute pancreatitis   . Coronary artery disease     Cardiac catheterization in 2007 at [facility] showed 30% stenosis in proximal LAD. No other disease. Ejection fraction was 65%    Past Surgical History  Procedure Laterality Date  . Cholecystectomy  2013  . Back surgery      multiple back surgeries (5)  . Vasectomy      With Reversal  . Spine surgery    . Vein surgery Right 2013    arm  . Cardiac catheterization      [facility]   . Trigger point injection      There were no vitals filed for this visit.  Visit Diagnosis:  Pain in right shoulder  Pain at surgical site  Shoulder weakness      Subjective Assessment - 05/01/15 1221    Subjective Patient reports shoulder felt much improved after treatment yesterday. States soreness is the same level as the evaluation.    Pertinent History Surgery on 04/13/2015: R shoulder scope, capsular release, subacromial decompression, labral debridement, open biceps tenodesis,  open rotator cuff repair. Five previous back repair surgeries and reports constant LBP. Patient reports recent MRI shows mensical tearing, and ACL injury involvement. Previous TBI .   Limitations Lifting;Walking   Patient Stated Goals To increase ability to move arm overhead and decrease shoulder pain.   Currently in Pain? Yes   Pain Score 9    Pain Location Shoulder   Pain Orientation Right   Pain Descriptors / Indicators Aching;Stabbing;Constant   Pain Type Surgical pain   Pain Onset More than a month ago         Objective:  Palpation: Increased tenderness and spasms along scapular external rotators, scapular retractors, upper trap, and lower traps on the right and right upper arm   Measurement:  Shoulder PROM: Flexion: 80 (60 pre treatment), ER: Neutral  Elbow PROM: Flexion: 120, Ext: 40 -- Did not perform through full range to decrease strain on biceps musculature   Therapeutic Exercise: Patient performed exercises with guidance, verbal and tactile cues and demonstration of therapist: Scapular retraction-- x 10 (Performed without UE movement through shortened range)  Manual Therapy:  PROM: shoulder flexion to t; to ~80 degrees following repetition -- 2 x 5; elbow flexion to 40-120 -- 2 x 5  Soft tissue mobilization to upper traps, shoulder external rotations, upper arm and  scapular retraction to decreased muscle spasms and pain.   Modalities: High volt via clinical protocol for muscle spasms with patient positioned in supine with arm supported with 3 towels and 4 electrodes placed on right shoulder over the upper trap and superior aspect of the shoulder. Ice placed over right shoulder for 20 minutes. No adverse skin reactions noted.   Patient response to treatment: Decreased pain to 8/10 after performing manual therapy and modalities. Improved elbow and shoulder PROM after performance manual therapy. Reported feeling "better" following estim.         PT Education -  05/01/15 1224    Education provided Yes   Education Details Educated to continue HEP and apply ice  to decrease pain.    Person(s) Educated Patient   Methods Explanation;Demonstration   Comprehension Verbalized understanding;Returned demonstration             PT Long Term Goals - 05/01/15 0756    PT LONG TERM GOAL #1   Title Pt will improve QuickDASH score to <60% by 05/28/15 to demonstrate functional improvement right shoulder function and allow for improvement of shoulder movement.    Baseline QuickDASH: 92%   Status New   PT LONG TERM GOAL #2   Title Pt will be independent with HEP focused on regaining passive shoulder ROM and scapular mobility/ control to allow for progression of exercises per protocol by 05/28/2015   Baseline Dependent requiring constant cueing for progression and exercise performance.    Status New   PT LONG TERM GOAL #3   Title Patient will report decreased pain level to 4/10 max. in right shoulder to allow improved ROM/movement by 05/28/15 to be able to perform personal care with less difficulty   Baseline patient reports maximal pain level of 9/10   Status New               Plan - 05/01/15 1237    Clinical Impression Statement Patient limited by increased shoulder pain today therefore focused on pain relief and shoulder PROM to maintain functional shoulder movement. Patient demonstrates decreased PROM and motor control and will benefit from further skilled therapy to return to prior level of function.    Pt will benefit from skilled therapeutic intervention in order to improve on the following deficits Decreased range of motion;Decreased coordination;Decreased endurance;Impaired UE functional use;Increased muscle spasms;Decreased activity tolerance;Decreased knowledge of precautions;Pain;Hypomobility;Decreased strength;Increased edema   Rehab Potential Good   Clinical Impairments Affecting Rehab Potential (+): highly motivated, age, acute condition (-)  previous orthopedic surgeries, chronic pain   PT Frequency 3x / week   PT Duration 4 weeks   PT Treatment/Interventions Neuromuscular re-education;Aquatic Therapy;Cryotherapy;Electrical Stimulation;Iontophoresis 4mg /ml Dexamethasone;Moist Heat;Therapeutic exercise;Therapeutic activities;Ultrasound;Patient/family education;Manual techniques;Passive range of motion;Scar mobilization   PT Home Exercise Plan Pendulums, scapular retractions, hand and wrist exercises   Consulted and Agree with Plan of Care Patient       Problem List Patient Active Problem List   Diagnosis Date Noted  . Left knee pain 04/08/2015  . Complete rotator cuff rupture of left shoulder 03/26/2015  . Elevated blood pressure 03/01/2015  . Fungal infection of foot 10/18/2014  . Health care maintenance 06/16/2014  . History of pancreatitis 06/12/2014  . Sinus tachycardia ([place]) 06/27/2013  . DOE (dyspnea on exertion) 06/04/2013  . Sebaceous cyst 02/05/2013  . Hyponatremia 11/04/2012  . Short-term memory loss 05/06/2012  . Anemia 05/06/2012  . Neck fullness 03/27/2012  . Chronic back pain 12/01/2011  . Hypercholesteremia 12/01/2011  . Clinical depression 07/29/2011  .  Failed back syndrome of lumbar spine 07/22/2011    Blythe Stanford, SPT 05/01/2015, 12:39 PM  [place] [facility] [address] [place], [address] Phone: [phone]   Fax:  [phone]  Name: [name] MRN: [id number] Date of Birth: 24-Dec-1960

## 2015-05-01 NOTE — Therapy (Signed)
[place] [facility] [address] [place], [address] Phone: [phone]   Fax:  [phone]  Physical Therapy Evaluation  Patient Details  Name: [name] MRN: [id number] Date of Birth: July 27, 1960 Referring Provider: Thomasene Lot. Johna Roles M.D.  Encounter Date: 04/30/2015      PT End of Session - 04/30/15 1726    Visit Number 1   Number of Visits 12   Date for PT Re-Evaluation 05/28/15   PT Start Time 1536   PT Stop Time 1640   PT Time Calculation (min) 64 min   Equipment Utilized During Treatment Other (comment)  Arm sling   Activity Tolerance Patient tolerated treatment well;Patient limited by pain   Behavior During Therapy [facility] for tasks assessed/performed      Past Medical History  Diagnosis Date  . Hypercholesterolemia   . Pancreatitis   . Chronic back pain     s/p 5 back surgeries  . Mild cognitive impairment with memory loss   . Anoxic brain injury ([place])   . Depression   . Allergy   . H/O acute pancreatitis   . Coronary artery disease     Cardiac catheterization in 2007 at [facility] showed 30% stenosis in proximal LAD. No other disease. Ejection fraction was 65%    Past Surgical History  Procedure Laterality Date  . Cholecystectomy  2013  . Back surgery      multiple back surgeries (5)  . Vasectomy      With Reversal  . Spine surgery    . Vein surgery Right 2013    arm  . Cardiac catheterization      [facility]   . Trigger point injection      There were no vitals filed for this visit.  Visit Diagnosis:  Pain in right shoulder - Plan: PT plan of care cert/re-cert  Pain at surgical site - Plan: PT plan of care cert/re-cert  Shoulder weakness - Plan: PT plan of care cert/re-cert      Subjective Assessment - 04/30/15 1554    Subjective Increased shoulder pain onset three months ago after swimming in the pool but denies traumatic injury. Surgery performed on 04/13/2015. States increased difficulty  with raising arm to apply deoderant, washing his hair, swimming (before surgery) opening doors, lifting and general movement of the shoudler. Patient states medication and rest decreases pain. Reportsa current 9/10 pain with the worst pain being a 10/10 and the best pain being a 7/10. Patient reports body is shaking secondary to the pain.   Pertinent History Surgery on 04/13/2015: R shoulder scope, capsular release, subacromial decompression, labral debridement, open biceps tenodesis, open rotator cuff repair. Five previous back repair surgeries and reports constant LBP. Patient reports recent MRI shows mensical tearing, and ACL injury involvement. Previous TBI .   Limitations Lifting;Walking   Patient Stated Goals To increase ability to move arm overhead and decrease shoulder pain.   Currently in Pain? Yes   Pain Score 9    Pain Location Shoulder   Pain Orientation Right   Pain Descriptors / Indicators Aching;Stabbing;Constant   Pain Type Surgical pain   Pain Onset More than a month ago   Pain Frequency Constant   Aggravating Factors  lifting,general shoulder movement, openeing doors, washing your hair    Pain Relieving Factors medication, rest            [facility] PT Assessment - 04/30/15 1547    Assessment   Medical Diagnosis R shoulder  scope, capsule release, subacromial decompression, labral debridement, open biceps tenedesis   Referring Provider Thomasene Lot. Johna Roles M.D.   Onset Date/Surgical Date 04/13/15   Hand Dominance Right   Next MD Visit 05/28/15   Prior Therapy No   Precautions   Precautions Shoulder   Type of Shoulder Precautions No shoulder AROM, No shoulder flexion PROM/AAROM over 140, no shoulder ER  PROM/AAROM > 40, No shoulder internal rotationPROM/AAROM  past L1   Shoulder Interventions/brace Shoulder sling/immobilizer   Balance Screen   Has the patient fallen in the past 6 months No   Has the patient had a decrease in activity level because of a fear of falling?  No   Is  the patient reluctant to leave their home because of a fear of falling?  No   Home Environment   Living Environment Private residence   Living Arrangements Children   Available Help at Discharge Family   Type of [place] to enter   Entrance Stairs-Number of Steps 2   Entrance Stairs-Rails None   Home Layout Two level   Alternate Level Stairs-Number of Steps 2   Alternate Level Stairs-Rails Left   Prior Function   Level of Independence Independent   Vocation Retired;On disability   Leisure Swimming , walking, driving   Cognition   Overall Cognitive Status Within Functional Limits for tasks assessed      Objective: Observation: Increased forward head posture and forwarded rounded shoulders bilaterally. Right arm placed in sling with shoulder in loose pack position. Mild swelling noted right shoulder/upper arm with multiple areas of small ecchymosis noted along right upper arm, Surgical incision  intact with no signs of infection; steri strips in place Palpation: Increased tenderness and spasms along scapular external rotators, scapular retractors, upper trap, and lower traps on the right and right upper arm   Measurement:  Shoulder PROM: Flexion: 90 (60 pre treatment), ER: Neutral  Elbow PROM: Flexion: 120, Ext: 40 -- Did not perform through full range to decrease strain on biceps musculature  Outcome Measures:  QuickDASH: 92% (severe self perceived disaibility)  Therapeutic Exercise: Patient performed exercises with guidance, verbal and tactile cues and demonstration of therapist: Scapular retraction-- x 10 (Performed without UE movement through shortened range) Pendulums with arm cradled in sling -- 2 x 20sec Hand Grip squeezes with pink foam -- x 10  Manual Therapy:  PROM: shoulder flexion to t; to 90 degrees following repetition -- 2 x 5; elbow flexion to 40-120 -- 2 x 5  Soft tissue mobilization to upper traps, shoulder external rotations, upper arm  and scapular retraction to decreased muscle spasms and pain.   Patient response to treatment: Decreased pain after performing PROM shoulder flexion and elbow flexion.Improved PROM shoulder forward elevation to ~90 degrees following repetition;  Decreased muscle spasms and guarding by 25% after performing manual therapy to upper traps and external rotators.          PT Education - 04/30/15 1723    Education provided Yes   Education Details Educated on maintaining shoulder precautions including no active ROM, proper shoulder resting position while laying down. Educated on strategies for applyiing deodorant and washing hair at home. HEP: foam squeezes, scapular retraction, cradled pendulums   Person(s) Educated Patient   Methods Explanation;Demonstration;Tactile cues;Handout   Comprehension Returned demonstration;Verbalized understanding             PT Long Term Goals - 05/01/15 0756    PT LONG TERM GOAL #1  Title Pt will improve QuickDASH score to <60% by 05/28/15 to demonstrate functional improvement right shoulder function and allow for improvement of shoulder movement.    Baseline QuickDASH: 92%   Status New   PT LONG TERM GOAL #2   Title Pt will be independent with HEP focused on regaining passive shoulder ROM and scapular mobility/ control to allow for progression of exercises per protocol by 05/28/2015   Baseline Dependent requiring constant cueing for progression and exercise performance.    Status New   PT LONG TERM GOAL #3   Title Patient will report decreased pain level to 4/10 max. in right shoulder to allow improved ROM/movement by 05/28/15 to be able to perform personal care with less difficulty   Baseline patient reports maximal pain level of 9/10   Status New               Plan - 05/01/15 0840    Clinical Impression Statement Patient is a 55 yo right handed male who presents with right shoulder pain and weakness s/p subacromial decompression, biceps  tenedesis, scope, and rotator cuff repair surgery. Patient demonstrates increased shoulder dysfunction marked by QuickDASH score of 92% limited use and impaired knowledge of R shoulder precautions requiring  cueing to not use right UE actively. Patient will benefit from further skilled therapy to address functional shoulder limitations and return to prior level of function.   Pt will benefit from skilled therapeutic intervention in order to improve on the following deficits Decreased range of motion;Decreased coordination;Decreased endurance;Impaired UE functional use;Increased muscle spasms;Decreased activity tolerance;Decreased knowledge of precautions;Pain;Hypomobility;Decreased strength;Increased edema   Rehab Potential Good   Clinical Impairments Affecting Rehab Potential (+): highly motivated, age, acute condition (-) previous orthopedic surgeries, chronic pain   PT Frequency 3x / week   PT Duration 4 weeks   PT Treatment/Interventions Neuromuscular re-education;Aquatic Therapy;Cryotherapy;Electrical Stimulation;Iontophoresis 4mg /ml Dexamethasone;Moist Heat;Therapeutic exercise;Therapeutic activities;Ultrasound;Patient/family education;Manual techniques;Passive range of motion;Scar mobilization   PT Home Exercise Plan Pendulums, scapular retractions, hand and wrist exercises   Consulted and Agree with Plan of Care Patient          G-Codes - 05-22-15 1800    Functional Assessment Tool Used QuickDash, ROM, strength, pain scale, clinical judgment   Functional Limitation Carrying, moving and handling objects   Carrying, Moving and Handling Objects Current Status [id number]) At least 80 percent but less than 100 percent impaired, limited or restricted   Carrying, Moving and Handling Objects Goal Status [id number]) At least 1 percent but less than 20 percent impaired, limited or restricted       Problem List Patient Active Problem List   Diagnosis Date Noted  . Left knee pain 04/08/2015  .  Complete rotator cuff rupture of left shoulder 03/26/2015  . Elevated blood pressure 03/01/2015  . Fungal infection of foot 10/18/2014  . Health care maintenance 06/16/2014  . History of pancreatitis 06/12/2014  . Sinus tachycardia ([place]) 06/27/2013  . DOE (dyspnea on exertion) 06/04/2013  . Sebaceous cyst 02/05/2013  . Hyponatremia 11/04/2012  . Short-term memory loss 05/06/2012  . Anemia 05/06/2012  . Neck fullness 03/27/2012  . Chronic back pain 12/01/2011  . Hypercholesteremia 12/01/2011  . Clinical depression 07/29/2011  . Failed back syndrome of lumbar spine 07/22/2011    Blythe Stanford, SPT 05/01/2015, 10:30 AM  [place] [facility] [address], [place], [address] Phone: [phone]   Fax:  [phone]  Name: [name] MRN: [id number] Date of Birth: 1960/05/20

## 2015-05-01 NOTE — Telephone Encounter (Signed)
Please advise an appt date and time in 6-8 weeks, I see that you already have 12 o'clock patients can he be a 430? Thanks

## 2015-05-01 NOTE — Telephone Encounter (Signed)
Can schedule 12:00 on 06/18/15.

## 2015-05-01 NOTE — Telephone Encounter (Signed)
Please advise a place on Dr. Bary Leriche schedule to place patient in 6-8 weeks for a 30 min follow up/jjb

## 2015-05-01 NOTE — Progress Notes (Signed)
Patient ID: [name], male   DOB: 1960-12-10, 55 y.o.   MRN: [id number]   Subjective:    Patient ID: [name], male    DOB: Feb 28, 1960, 55 y.o.   MRN: [id number]  HPI  Patient here for a scheduled follow up.  He is s/p right shoulder arthroscopy.  Going to physical therapy.  He also injured his knee - previous fall.  Seeing ortho.  Had MRI.  States concern over ACL injury or meniscus tear.  States since his surgery, he has noticed some nausea.  No vomiting.  Bowels stable.  No abdominal pain or cramping.  Question of acid reflux.  Breathing stable.  No chest pain or tightness.     Past Medical History  Diagnosis Date  . Hypercholesterolemia   . Pancreatitis   . Chronic back pain     s/p 5 back surgeries  . Mild cognitive impairment with memory loss   . Anoxic brain injury ([place])   . Depression   . Allergy   . H/O acute pancreatitis   . Coronary artery disease     Cardiac catheterization in 2007 at [facility] showed 30% stenosis in proximal LAD. No other disease. Ejection fraction was 65%   Past Surgical History  Procedure Laterality Date  . Cholecystectomy  2013  . Back surgery      multiple back surgeries (5)  . Vasectomy      With Reversal  . Spine surgery    . Vein surgery Right 2013    arm  . Cardiac catheterization      [facility]   . Trigger point injection     Family History  Problem Relation Age of Onset  . Heart disease Father   . Heart attack Father   . Hypertension    . Colon cancer    . Heart disease Mother   . Heart attack Mother   . Heart attack Brother     MI's x 2    Social History   Social History  . Marital Status: Married    Spouse Name: N/A  . Number of Children: 3  . Years of Education: N/A   Social History Main Topics  . Smoking status: Never Smoker   . Smokeless tobacco: Never Used  . Alcohol Use: No  . Drug Use: No  . Sexual Activity: Not Asked   Other Topics Concern  . None   Social History Narrative    Outpatient Encounter  Prescriptions as of 05/01/2015  Medication Sig  . amLODipine (NORVASC) 5 MG tablet Take by mouth.  Marland Kitchen amphetamine-dextroamphetamine (ADDERALL) 20 MG tablet Take 20 mg by mouth 3 (three) times daily.  . cetirizine (ZYRTEC) 10 MG tablet Take 10 mg by mouth daily.  . diazepam (VALIUM) 5 MG tablet Take 5-10 mg by mouth every 8 (eight) hours as needed (pain). Reported on 04/08/2015  . fluticasone (FLONASE) 50 MCG/ACT nasal spray Place 2 sprays into both nostrils daily.  Marland Kitchen HYDROcodone-acetaminophen (NORCO) 5-325 MG tablet   . naproxen (NAPROSYN) 500 MG tablet Take by mouth.  . oxyCODONE (OXY IR/ROXICODONE) 5 MG immediate release tablet   . oxyCODONE-acetaminophen (ROXICET) 5-325 MG tablet Take 1 tablet by mouth every 6 (six) hours as needed for severe pain.  Marland Kitchen tiZANidine (ZANAFLEX) 4 MG tablet Take 4 mg by mouth 3 (three) times daily.  Marland Kitchen zolpidem (AMBIEN) 10 MG tablet Take 10 mg by mouth daily.  Marland Kitchen esomeprazole (NEXIUM) 40 MG capsule Take 1 capsule (40 mg total) by  mouth daily.  . [DISCONTINUED] amphetamine-dextroamphetamine (ADDERALL) 20 MG tablet   . [DISCONTINUED] cyclobenzaprine (FLEXERIL) 10 MG tablet Take 1 tablet (10 mg total) by mouth 3 (three) times daily as needed for muscle spasms. (Patient not taking: Reported on 04/08/2015)  . [DISCONTINUED] zolpidem (AMBIEN) 10 MG tablet Take by mouth.   No facility-administered encounter medications on file as of 05/01/2015.    Review of Systems  Constitutional: Negative for appetite change and unexpected weight change.  HENT: Negative for congestion and sinus pressure.   Respiratory: Negative for cough, chest tightness and shortness of breath.   Cardiovascular: Negative for chest pain, palpitations and leg swelling.  Gastrointestinal: Positive for nausea. Negative for vomiting, abdominal pain and diarrhea.  Genitourinary: Negative for dysuria and difficulty urinating.  Musculoskeletal: Positive for back pain (chronic back pain). Negative for myalgias  and joint swelling.       Shoulder pain and left knee pain as outlined.    Skin: Negative for color change and rash.  Neurological: Negative for dizziness, light-headedness and headaches.  Psychiatric/Behavioral: Negative for dysphoric mood and agitation.       Objective:    Physical Exam  Constitutional: He appears well-developed and well-nourished. No distress.  HENT:  Nose: Nose normal.  Mouth/Throat: Oropharynx is clear and moist.  Neck: Neck supple. No thyromegaly present.  Cardiovascular: Normal rate and regular rhythm.   Pulmonary/Chest: Effort normal and breath sounds normal. No respiratory distress.  Abdominal: Soft. Bowel sounds are normal. There is no tenderness.  Musculoskeletal: He exhibits no edema or tenderness.  Arm in a sling.  Left knee - brace.    Lymphadenopathy:    He has no cervical adenopathy.  Skin: No rash noted. No erythema.  Psychiatric: He has a normal mood and affect. His behavior is normal.    BP 120/80 mmHg  Pulse 83  Temp(Src) 98.3 F (36.8 C) (Oral)  Resp 18  Ht 6\' 1"  (1.854 m)  Wt 232 lb (105.235 kg)  BMI 30.62 kg/m2  SpO2 94% Wt Readings from Last 3 Encounters:  05/01/15 232 lb (105.235 kg)  04/10/15 234 lb (106.142 kg)  04/08/15 235 lb 9.6 oz (106.867 kg)     Lab Results  Component Value Date   WBC 8.5 04/30/2015   HGB 15.0 04/30/2015   HCT 43.6 04/30/2015   PLT 320.0 04/30/2015   GLUCOSE 95 04/30/2015   CHOL 157 04/30/2015   TRIG 170.0* 04/30/2015   HDL 30.60* 04/30/2015   LDLCALC 93 04/30/2015   ALT 104* 04/30/2015   AST 41* 04/30/2015   NA 138 04/30/2015   K 3.7 04/30/2015   CL 106 04/30/2015   CREATININE 0.98 04/30/2015   BUN 20 04/30/2015   CO2 23 04/30/2015   TSH 1.42 04/30/2015   PSA 0.72 06/12/2014   INR 0.9 10/27/2011   HGBA1C 5.3 09/14/2012    Mr Knee Left  Wo Contrast  04/30/2015  CLINICAL DATA:  Anteromedial knee pain with swelling for 4 months. Worsening symptoms over the last 5 weeks. No acute  injury or prior relevant surgery. EXAM: MRI OF THE LEFT KNEE WITHOUT CONTRAST TECHNIQUE: Multiplanar, multisequence MR imaging of the knee was performed. No intravenous contrast was administered. COMPARISON:  Radiographs 04/08/2015 FINDINGS: MENISCI Medial meniscus:  Intact with normal morphology. Lateral meniscus:  Intact with normal morphology. LIGAMENTS Cruciates:  Intact. Collaterals:  Intact. CARTILAGE Patellofemoral:  Preserved. Medial: There is a full-thickness chondral defect involving the posterior aspect of the medial femoral condyle, measuring up to 7 x  14 mm. There is no abnormal subchondral signal. The tibial cartilage appears intact. Lateral:  Preserved. OTHER Joint:  Small joint effusion.  No discrete loose bodies observed. Popliteal Fossa:  Unremarkable. No significant Baker's cyst. Extensor Mechanism: Intact. The medial patellar plica is mildly thickened. Bones: No acute or significant extra-articular osseous findings. There is developmental fragmentation of the tibial tubercle without associated marrow or surrounding soft tissue edema. IMPRESSION: 1. Large full-thickness chondral defect involving the posterior aspect of the medial femoral condyle. No associated abnormal subchondral signal or definite loose bodies observed. 2. The menisci, cruciate and collateral ligaments are intact. 3. Small knee joint effusion. Electronically Signed   By: Richardean Sale M.D.   On: 04/30/2015 13:57       Assessment & Plan:   Problem List Items Addressed This Visit    Abnormal liver function tests - Primary    AST, ALT and alkaline phos - slightly elevated.  Recheck liver panel soon.  With nausea since surgery.  Lipase wnl. Check abdominal ultrasound.  Has the nausea as outlined.  Start nexium 40mg  q day.  Follow.  Get him back in soon to reassess.        Relevant Orders   Hepatic function panel   Chronic back pain    Followed at pain clinic.       Elevated blood pressure    On amlodipine.  Blood  pressure doing better.  Follow.        History of pancreatitis    Recent lipase wnl.        Hypercholesteremia    Low cholesterol diet and exercise.  LDL recently checked - improved - 93.  Triglycerides 170.  Low carb diet.  Follow.        Left knee pain    Seeing ortho.  Had MRI.  Waiting for results and f/u from ortho.  Follow.  Knee brace.          Einar Pheasant, MD

## 2015-05-01 NOTE — Patient Instructions (Signed)
Take nexium 30 minutes before breakfast

## 2015-05-01 NOTE — Progress Notes (Signed)
Pre-visit discussion using our clinic review tool. No additional management support is needed unless otherwise documented below in the visit note.  

## 2015-05-03 ENCOUNTER — Encounter: Payer: Self-pay | Admitting: Internal Medicine

## 2015-05-03 DIAGNOSIS — R7989 Other specified abnormal findings of blood chemistry: Secondary | ICD-10-CM | POA: Insufficient documentation

## 2015-05-03 DIAGNOSIS — R945 Abnormal results of liver function studies: Secondary | ICD-10-CM | POA: Insufficient documentation

## 2015-05-03 NOTE — Assessment & Plan Note (Signed)
Followed at pain clinic 

## 2015-05-03 NOTE — Assessment & Plan Note (Signed)
Low cholesterol diet and exercise.  LDL recently checked - improved - 93.  Triglycerides 170.  Low carb diet.  Follow.

## 2015-05-03 NOTE — Assessment & Plan Note (Signed)
Seeing ortho.  Had MRI.  Waiting for results and f/u from ortho.  Follow.  Knee brace.

## 2015-05-03 NOTE — Assessment & Plan Note (Addendum)
AST, ALT and alkaline phos - slightly elevated.  Recheck liver panel soon.  With nausea since surgery.  Lipase wnl. Check abdominal ultrasound.  Has the nausea as outlined.  Start nexium 40mg  q day.  Follow.  Get him back in soon to reassess.

## 2015-05-03 NOTE — Assessment & Plan Note (Signed)
On amlodipine.  Blood pressure doing better.  Follow.

## 2015-05-03 NOTE — Assessment & Plan Note (Signed)
Recent lipase wnl.  

## 2015-05-04 ENCOUNTER — Encounter: Payer: Self-pay | Admitting: Physical Therapy

## 2015-05-04 ENCOUNTER — Ambulatory Visit: Payer: PPO | Attending: Orthopedic Surgery | Admitting: Physical Therapy

## 2015-05-04 DIAGNOSIS — R29898 Other symptoms and signs involving the musculoskeletal system: Secondary | ICD-10-CM | POA: Diagnosis present

## 2015-05-04 DIAGNOSIS — M25511 Pain in right shoulder: Secondary | ICD-10-CM | POA: Diagnosis present

## 2015-05-04 DIAGNOSIS — G8918 Other acute postprocedural pain: Secondary | ICD-10-CM | POA: Diagnosis present

## 2015-05-04 DIAGNOSIS — M6281 Muscle weakness (generalized): Secondary | ICD-10-CM | POA: Insufficient documentation

## 2015-05-04 NOTE — Addendum Note (Signed)
Addended by: Dene Gentry on: 05/04/2015 04:29 PM   Modules accepted: Miquel Dunn

## 2015-05-04 NOTE — Therapy (Signed)
[facility] [address] [place], [address] Phone: [phone]   Fax:  [phone]  Physical Therapy Treatment  Patient Details  Name: [name] MRN: [id number] Date of Birth: [birth year] Referring Provider: Thomasene Lot. Johna Roles M.D.  Encounter Date: 05/04/2015      PT End of Session - 05/04/15 1158    Visit Number 3   Number of Visits 12   Date for PT Re-Evaluation 05/28/15   Authorization Type 3   Authorization Time Period 10 G code   PT Start Time [phone]   PT Stop Time 0932   PT Time Calculation (min) 45 min   Equipment Utilized During Treatment Other (comment)  Arm sling   Activity Tolerance Patient tolerated treatment well;Patient limited by pain   Behavior During Therapy [facility] for tasks assessed/performed      Past Medical History  Diagnosis Date  . Hypercholesterolemia   . Pancreatitis   . Chronic back pain     s/p 5 back surgeries  . Mild cognitive impairment with memory loss   . Anoxic brain injury ([place])   . Depression   . Allergy   . H/O acute pancreatitis   . Coronary artery disease     Cardiac catheterization in 2007 at [facility] showed 30% stenosis in proximal LAD. No other disease. Ejection fraction was 65%    Past Surgical History  Procedure Laterality Date  . Cholecystectomy  2013  . Back surgery      multiple back surgeries (5)  . Vasectomy      With Reversal  . Spine surgery    . Vein surgery Right 2013    arm  . Cardiac catheterization      [facility]   . Trigger point injection      There were no vitals filed for this visit.  Visit Diagnosis:  Shoulder weakness  Pain at surgical site  Pain in right shoulder      Subjective Assessment - 05/04/15 0850    Subjective Patient reports increase in shoulder pain today after medication wore off at 1 am this morning.    Pertinent History Surgery on 04/13/2015: R shoulder scope, capsular release, subacromial decompression, labral  debridement, open biceps tenodesis, open rotator cuff repair. Five previous back repair surgeries and reports constant LBP. Patient reports recent MRI shows mensical tearing, and ACL injury involvement. Previous TBI .   Limitations Lifting;Walking   Patient Stated Goals To increase ability to move arm overhead and decrease shoulder pain.   Currently in Pain? Yes   Pain Score 9    Pain Location Shoulder   Pain Orientation Right   Pain Descriptors / Indicators Aching;Constant;Stabbing   Pain Type Surgical pain   Pain Onset More than a month ago        Objective: Palpation: Increased tenderness and spasms along scapular external rotators, scapular retractors, upper trap, on the right upper arm. Increased redness and irrational in anterior aspect of the elbow and increased tenderness to palpation to pec major on the affected side.   Measurement:  Shoulder PROM: Flexion: 80 (60 pre treatment), ER: Neutral   Elbow PROM: Flexion: 120, Ext: 40 -- Did not perform through full range to decrease strain on biceps musculature   Therapeutic Exercise: Patient performed exercises with guidance, verbal and tactile cues and demonstration of therapist: Scapular retraction-- x 10 (Performed without UE movement through shortened range)  Manual Therapy:   PROM: shoulder flexion to ~80 degrees following  repetition -- 2 x 5; elbow flexion to 40-120 -- 2 x 5; Shoulder external rotation -- x 5  Soft tissue mobilization to upper traps, shoulder external rotations, upper arm and scapular retraction with patient in sitting position with arm supported on pillow to decreased muscle spasms and pain.   Modalities: Interferencial current via clinical protocol for muscle spasms with patient positioned in supine with arm supported with 3 towels and 4 electrodes placed on right shoulder over the upper trap and superior aspect of the shoulder. Ice placed over right shoulder for 10 minutes. No adverse skin reactions noted.    Patient response to treatment: Decreased pain to 7/10 after performing manual therapy and modalities and reported feeling "better" after estim. Increased pain along pectoralis major at around ~80 degrees of shoulder flexion which is decreased after performing manual therapy.           PT Education - 05/04/15 1158    Education provided Yes   Education Details Educated to continue HEP and decrease use of sling at arm with it supported on pillows   Person(s) Educated Patient   Methods Explanation   Comprehension Verbalized understanding             PT Long Term Goals - 05/01/15 0756    PT LONG TERM GOAL #1   Title Pt will improve QuickDASH score to <60% by 05/28/15 to demonstrate functional improvement right shoulder function and allow for improvement of shoulder movement.    Baseline QuickDASH: 92%   Status New   PT LONG TERM GOAL #2   Title Pt will be independent with HEP focused on regaining passive shoulder ROM and scapular mobility/ control to allow for progression of exercises per protocol by 05/28/2015   Baseline Dependent requiring constant cueing for progression and exercise performance.    Status New   PT LONG TERM GOAL #3   Title Patient will report decreased pain level to 4/10 max. in right shoulder to allow improved ROM/movement by 05/28/15 to be able to perform personal care with less difficulty   Baseline patient reports maximal pain level of 9/10   Status New               Plan - 05/04/15 1208    Clinical Impression Statement Improved shoulder flexion PROM demonstrated by decreased muscle guarding and improved motor control. Patient instructed to rest arm outside of sling at home with the elbow extended to decrease irritation in anterior aspect of the elbow and will benefit from further skilled therapy to return to prior level of function.    Pt will benefit from skilled therapeutic intervention in order to improve on the following deficits Decreased  range of motion;Decreased coordination;Decreased endurance;Impaired UE functional use;Increased muscle spasms;Decreased activity tolerance;Decreased knowledge of precautions;Pain;Hypomobility;Decreased strength;Increased edema   Rehab Potential Good   Clinical Impairments Affecting Rehab Potential (+): highly motivated, age, acute condition (-) previous orthopedic surgeries, chronic pain   PT Frequency 3x / week   PT Duration 4 weeks   PT Treatment/Interventions Neuromuscular re-education;Aquatic Therapy;Cryotherapy;Electrical Stimulation;Iontophoresis 4mg /ml Dexamethasone;Moist Heat;Therapeutic exercise;Therapeutic activities;Ultrasound;Patient/family education;Manual techniques;Passive range of motion;Scar mobilization   PT Next Visit Plan Continue PROM   PT Home Exercise Plan Pendulums, scapular retractions, hand and wrist exercises   Consulted and Agree with Plan of Care Patient        Problem List Patient Active Problem List   Diagnosis Date Noted  . Abnormal liver function tests 05/03/2015  . Left knee pain 04/08/2015  . Complete rotator  cuff rupture of left shoulder 03/26/2015  . Elevated blood pressure 03/01/2015  . Fungal infection of foot 10/18/2014  . Health care maintenance 06/16/2014  . History of pancreatitis 06/12/2014  . Sinus tachycardia ([place]) 06/27/2013  . DOE (dyspnea on exertion) 06/04/2013  . Sebaceous cyst 02/05/2013  . Hyponatremia 11/04/2012  . Short-term memory loss 05/06/2012  . Anemia 05/06/2012  . Neck fullness 03/27/2012  . Chronic back pain 12/01/2011  . Hypercholesteremia 12/01/2011  . Clinical depression 07/29/2011  . Failed back syndrome of lumbar spine 07/22/2011    Blythe Stanford, SPT 05/04/2015, 12:16 PM  [facility] [address], [place], [address] Phone: [phone]   Fax:  [phone]  Name: [name] MRN: [id number] Date of Birth: 06-11-1960

## 2015-05-06 ENCOUNTER — Other Ambulatory Visit (INDEPENDENT_AMBULATORY_CARE_PROVIDER_SITE_OTHER): Payer: PPO

## 2015-05-06 ENCOUNTER — Ambulatory Visit: Payer: PPO | Admitting: Physical Therapy

## 2015-05-06 ENCOUNTER — Encounter: Payer: Self-pay | Admitting: Physical Therapy

## 2015-05-06 DIAGNOSIS — R29898 Other symptoms and signs involving the musculoskeletal system: Secondary | ICD-10-CM

## 2015-05-06 DIAGNOSIS — M25511 Pain in right shoulder: Secondary | ICD-10-CM

## 2015-05-06 DIAGNOSIS — R7989 Other specified abnormal findings of blood chemistry: Secondary | ICD-10-CM

## 2015-05-06 DIAGNOSIS — G8918 Other acute postprocedural pain: Secondary | ICD-10-CM

## 2015-05-06 DIAGNOSIS — R945 Abnormal results of liver function studies: Secondary | ICD-10-CM

## 2015-05-06 LAB — HEPATIC FUNCTION PANEL
ALT: 114 U/L — ABNORMAL HIGH (ref 0–53)
AST: 59 U/L — AB (ref 0–37)
Albumin: 4.4 g/dL (ref 3.5–5.2)
Alkaline Phosphatase: 164 U/L — ABNORMAL HIGH (ref 39–117)
BILIRUBIN DIRECT: 0.1 mg/dL (ref 0.0–0.3)
BILIRUBIN TOTAL: 0.5 mg/dL (ref 0.2–1.2)
TOTAL PROTEIN: 7.9 g/dL (ref 6.0–8.3)

## 2015-05-06 NOTE — Therapy (Signed)
[place] [facility] [address] [place], [address] Phone: [phone]   Fax:  [phone]  Physical Therapy Treatment  Patient Details  Name: [name] MRN: [id number] Date of Birth: Apr 02, 1960 Referring Provider: Thomasene Lot. Johna Roles M.D.  Encounter Date: 05/06/2015      PT End of Session - 05/06/15 0843    Visit Number 4   Number of Visits 12   Date for PT Re-Evaluation 05/28/15   Authorization Type 4   Authorization Time Period 10 G code   PT Start Time 0759   PT Stop Time 0845   PT Time Calculation (min) 46 min   Equipment Utilized During Treatment Other (comment)  Arm sling   Activity Tolerance Patient tolerated treatment well;Patient limited by pain   Behavior During Therapy [facility] for tasks assessed/performed      Past Medical History  Diagnosis Date  . Hypercholesterolemia   . Pancreatitis   . Chronic back pain     s/p 5 back surgeries  . Mild cognitive impairment with memory loss   . Anoxic brain injury ([place])   . Depression   . Allergy   . H/O acute pancreatitis   . Coronary artery disease     Cardiac catheterization in 2007 at [facility] showed 30% stenosis in proximal LAD. No other disease. Ejection fraction was 65%    Past Surgical History  Procedure Laterality Date  . Cholecystectomy  2013  . Back surgery      multiple back surgeries (5)  . Vasectomy      With Reversal  . Spine surgery    . Vein surgery Right 2013    arm  . Cardiac catheterization      [facility]   . Trigger point injection      There were no vitals filed for this visit.  Visit Diagnosis:  Pain at surgical site  Shoulder weakness  Pain in right shoulder      Subjective Assessment - 05/06/15 0800    Subjective Patient reports increased pain in the shoulder and states its spasming today. States he has been resting elbow in extension at home propped onto a pillow.   Pertinent History Surgery on 04/13/2015: R shoulder  scope, capsular release, subacromial decompression, labral debridement, open biceps tenodesis, open rotator cuff repair. Five previous back repair surgeries and reports constant LBP. Patient reports recent MRI shows mensical tearing, and ACL injury involvement. Previous TBI .   Limitations Lifting;Walking   Patient Stated Goals To increase ability to move arm overhead and decrease shoulder pain.   Currently in Pain? Yes   Pain Score 9    Pain Location Shoulder   Pain Orientation Right   Pain Descriptors / Indicators Aching;Constant;Stabbing   Pain Type Surgical pain   Pain Onset More than a month ago      Objective: Palpation: Increased tenderness and spasms along scapular external rotators, scapular retractors, upper trap, on the right upper arm. Improved redness and irrational in anterior aspect of the elbow and increased tenderness to palpation to pec major on the affected side.   Measurement:  Shoulder PROM: Flexion: 90 (60 pre treatment), ER: 5 Elbow PROM: Flexion: 120, Ext: 40 -- Did not perform through full range to decrease strain on biceps musculature   Therapeutic Exercise: Patient performed exercises with guidance, verbal and tactile cues and demonstration of therapist: Scapular retraction in sitting and supine-- x 10 (Performed without UE movement through shortened range) Scapular depression in  sitting -- unable to perform without increase in pain.   Manual Therapy:  PROM: shoulder flexion to ~80/90 degrees following repetition -- 2 x 5; elbow flexion to 40-120 --  x 5; Shoulder external rotation -- x 5  Soft tissue mobilization to upper traps, shoulder external rotations, upper arm and scapular retraction with patient in sitting position with arm supported on pillow to decreased muscle spasms and pain. Suboccipital release performed in supine to decrease spasms and pain.  Modalities: Electrical stimulation: Interferencial current via clinical protocol for muscle spasms  with patient positioned in sitting with arm supported with pillow and 4 electrodes placed on right shoulder over the upper trap and lateral/anterior and posterior aspect of the shoulder. Ice placed over right shoulder for 15 minutes with estim.. No adverse skin reactions noted.   Patient response to treatment: Decreased pain to 7/10 after performing manual therapy and modalities. Decreased scapular depression motion in sitting and patient required change to supine position to perform to appropriate motor control techniques.         PT Long Term Goals - 05/01/15 0756    PT LONG TERM GOAL #1   Title Pt will improve QuickDASH score to <60% by 05/28/15 to demonstrate functional improvement right shoulder function and allow for improvement of shoulder movement.    Baseline QuickDASH: 92%   Status New   PT LONG TERM GOAL #2   Title Pt will be independent with HEP focused on regaining passive shoulder ROM and scapular mobility/ control to allow for progression of exercises per protocol by 05/28/2015   Baseline Dependent requiring constant cueing for progression and exercise performance.    Status New   PT LONG TERM GOAL #3   Title Patient will report decreased pain level to 4/10 max. in right shoulder to allow improved ROM/movement by 05/28/15 to be able to perform personal care with less difficulty   Baseline patient reports maximal pain level of 9/10   Status New               Plan - 05/06/15 0959    Clinical Impression Statement Patient continues to improve with shoulder flexion and ER passive range of motion indicating decreased pain and muscle guarding and improving motor control. Patient demonstrates decreased muscular spasms when performing PROM and will benefit from further skilled therapy to return to prior level of function.    Pt will benefit from skilled therapeutic intervention in order to improve on the following deficits Decreased range of motion;Decreased  coordination;Decreased endurance;Impaired UE functional use;Increased muscle spasms;Decreased activity tolerance;Decreased knowledge of precautions;Pain;Hypomobility;Decreased strength;Increased edema   Rehab Potential Good   Clinical Impairments Affecting Rehab Potential (+): highly motivated, age, acute condition (-) previous orthopedic surgeries, chronic pain   PT Frequency 3x / week   PT Duration 4 weeks   PT Treatment/Interventions Neuromuscular re-education;Aquatic Therapy;Cryotherapy;Electrical Stimulation;Iontophoresis 4mg /ml Dexamethasone;Moist Heat;Therapeutic exercise;Therapeutic activities;Ultrasound;Patient/family education;Manual techniques;Passive range of motion;Scar mobilization   PT Next Visit Plan Continue PROM   PT Home Exercise Plan Pendulums, scapular retractions, hand and wrist exercises   Consulted and Agree with Plan of Care Patient        Problem List Patient Active Problem List   Diagnosis Date Noted  . Abnormal liver function tests 05/03/2015  . Left knee pain 04/08/2015  . Complete rotator cuff rupture of left shoulder 03/26/2015  . Elevated blood pressure 03/01/2015  . Fungal infection of foot 10/18/2014  . Health care maintenance 06/16/2014  . History of pancreatitis 06/12/2014  . Sinus  tachycardia ([place]) 06/27/2013  . DOE (dyspnea on exertion) 06/04/2013  . Sebaceous cyst 02/05/2013  . Hyponatremia 11/04/2012  . Short-term memory loss 05/06/2012  . Anemia 05/06/2012  . Neck fullness 03/27/2012  . Chronic back pain 12/01/2011  . Hypercholesteremia 12/01/2011  . Clinical depression 07/29/2011  . Failed back syndrome of lumbar spine 07/22/2011    Blythe Stanford, SPT 05/06/2015, 10:03 AM  [place] [facility] [address], [place], [address] Phone: [phone]   Fax:  [phone]  Name: [name] MRN: [id number] Date of Birth: [birth year]

## 2015-05-07 ENCOUNTER — Encounter: Payer: PPO | Admitting: Physical Therapy

## 2015-05-08 ENCOUNTER — Other Ambulatory Visit: Payer: Self-pay | Admitting: Internal Medicine

## 2015-05-08 ENCOUNTER — Ambulatory Visit
Admission: RE | Admit: 2015-05-08 | Discharge: 2015-05-08 | Disposition: A | Discharge status: Home / Self Care | Payer: PPO | Source: Ambulatory Visit | Attending: Internal Medicine | Admitting: Internal Medicine

## 2015-05-08 ENCOUNTER — Encounter: Payer: Self-pay | Admitting: Physical Therapy

## 2015-05-08 ENCOUNTER — Ambulatory Visit: Payer: PPO | Admitting: Physical Therapy

## 2015-05-08 ENCOUNTER — Encounter: Payer: Self-pay | Admitting: *Deleted

## 2015-05-08 DIAGNOSIS — M25511 Pain in right shoulder: Secondary | ICD-10-CM

## 2015-05-08 DIAGNOSIS — R110 Nausea: Secondary | ICD-10-CM

## 2015-05-08 DIAGNOSIS — R161 Splenomegaly, not elsewhere classified: Secondary | ICD-10-CM | POA: Diagnosis not present

## 2015-05-08 DIAGNOSIS — R7989 Other specified abnormal findings of blood chemistry: Secondary | ICD-10-CM | POA: Diagnosis not present

## 2015-05-08 DIAGNOSIS — G8918 Other acute postprocedural pain: Secondary | ICD-10-CM

## 2015-05-08 DIAGNOSIS — R945 Abnormal results of liver function studies: Secondary | ICD-10-CM

## 2015-05-08 DIAGNOSIS — Z9049 Acquired absence of other specified parts of digestive tract: Secondary | ICD-10-CM | POA: Diagnosis not present

## 2015-05-08 DIAGNOSIS — N281 Cyst of kidney, acquired: Secondary | ICD-10-CM | POA: Insufficient documentation

## 2015-05-08 DIAGNOSIS — R29898 Other symptoms and signs involving the musculoskeletal system: Secondary | ICD-10-CM | POA: Diagnosis not present

## 2015-05-08 DIAGNOSIS — M6281 Muscle weakness (generalized): Secondary | ICD-10-CM

## 2015-05-08 NOTE — Therapy (Signed)
[place] [facility] [address] [place], [address] Phone: [phone]   Fax:  [phone]  Physical Therapy Treatment  Patient Details  Name: [name] MRN: [id number] Date of Birth: February 18, 1960 Referring Provider: Thomasene Lot. Johna Roles M.D.  Encounter Date: 05/08/2015      PT End of Session - 05/08/15 1014    Visit Number 5   Number of Visits 12   Date for PT Re-Evaluation 05/28/15   Authorization Type 5   Authorization Time Period 10 G code   PT Start Time 0801   PT Stop Time 0850   PT Time Calculation (min) 49 min   Equipment Utilized During Treatment Other (comment)  Arm sling   Activity Tolerance Patient tolerated treatment well;Patient limited by pain   Behavior During Therapy [facility] for tasks assessed/performed      Past Medical History  Diagnosis Date  . Hypercholesterolemia   . Pancreatitis   . Chronic back pain     s/p 5 back surgeries  . Mild cognitive impairment with memory loss   . Anoxic brain injury ([place])   . Depression   . Allergy   . H/O acute pancreatitis   . Coronary artery disease     Cardiac catheterization in 2007 at [facility] showed 30% stenosis in proximal LAD. No other disease. Ejection fraction was 65%    Past Surgical History  Procedure Laterality Date  . Cholecystectomy  2013  . Back surgery      multiple back surgeries (5)  . Vasectomy      With Reversal  . Spine surgery    . Vein surgery Right 2013    arm  . Cardiac catheterization      [facility]   . Trigger point injection      There were no vitals filed for this visit.      Subjective Assessment - 05/08/15 0802    Subjective Patient reports the pain feels about the same since the last visit. Patient states he is optimisic about the shoulder improving.   Limitations Lifting;Walking   Patient Stated Goals To increase ability to move arm overhead and decrease shoulder pain.   Currently in Pain? Yes   Pain Score 9     Pain Location Shoulder   Pain Orientation Right   Pain Descriptors / Indicators Aching;Stabbing   Pain Type Surgical pain      Objective: Palpation: Increased tenderness and spasms along scapular external rotators, scapular retractors, upper trap, pectoralis major on the right upper arm. Improved redness and irrational in anterior aspect of the elbow. Measurement:  Shoulder PROM: Flexion: 90 (60 pre treatment), ER: 5 Elbow PROM: Flexion: 120, Ext: 40 -- Did not perform through full range to decrease strain on biceps musculature   Therapeutic Exercise: Patient performed exercises with guidance, verbal and tactile cues and demonstration of therapist: Scapular retraction in sitting and supine-- x 10 (Performed without UE movement through shortened range)  Manual Therapy:  PROM: shoulder flexion to ~80/90 degrees following repetition -- 2 x 5; elbow flexion to 40-120 -- x 5; Shoulder external rotation -- x 5  Soft tissue mobilization to upper traps, shoulder external rotations, upper arm, pectoralis major and scapular retraction with patient in supine position with arm supported on towels to decreased muscle spasms and pain. Suboccipital release performed in supine to decrease spasms and pain.  Modalities: Electrical stimulation: Interferencial current via clinical protocol for muscle spasms with patient positioned in sitting with arm  supported with pillow and 4 electrodes placed on right shoulder over the upper trap and lateral/anterior and posterior aspect of the shoulder. Ice placed over right shoulder for 15 minutes with estim.. No adverse skin reactions noted.   Patient response to treatment: Decreased pain to 6/10 after performing treatment today. Good demonstration of motor control when performing shoulder PROM today requiring minimal cueing to not activate anterior shoulder musculature. Improved PROM to >90 degrees with repetition and cuing         PT Education - 05/08/15 1013     Education provided Yes   Education Details Continue HEP for pain control and scapular mobility   Person(s) Educated Patient   Methods Demonstration;Explanation   Comprehension Verbalized understanding;Returned demonstration             PT Long Term Goals - 05/01/15 0756    PT LONG TERM GOAL #1   Title Pt will improve QuickDASH score to <60% by 05/28/15 to demonstrate functional improvement right shoulder function and allow for improvement of shoulder movement.    Baseline QuickDASH: 92%   Status New   PT LONG TERM GOAL #2   Title Pt will be independent with HEP focused on regaining passive shoulder ROM and scapular mobility/ control to allow for progression of exercises per protocol by 05/28/2015   Baseline Dependent requiring constant cueing for progression and exercise performance.    Status New   PT LONG TERM GOAL #3   Title Patient will report decreased pain level to 4/10 max. in right shoulder to allow improved ROM/movement by 05/28/15 to be able to perform personal care with less difficulty   Baseline patient reports maximal pain level of 9/10   Status New               Plan - 05/08/15 1016    Clinical Impression Statement Improvement in motor control today indicated by decreased muscle spasms during shoulder PROM. Patient continues to have decreased shoulder PROM before onset of pain and will benefit from further skilled therapy to return to proir level of function.    Rehab Potential Good   Clinical Impairments Affecting Rehab Potential (+): highly motivated, age, acute condition (-) previous orthopedic surgeries, chronic pain   PT Frequency 3x / week   PT Duration 4 weeks   PT Treatment/Interventions Neuromuscular re-education;Aquatic Therapy;Cryotherapy;Electrical Stimulation;Iontophoresis 4mg /ml Dexamethasone;Moist Heat;Therapeutic exercise;Therapeutic activities;Ultrasound;Patient/family education;Manual techniques;Passive range of motion;Scar mobilization   PT  Next Visit Plan Continue PROM   PT Home Exercise Plan Pendulums, scapular retractions, hand and wrist exercises   Consulted and Agree with Plan of Care Patient      Patient will benefit from skilled therapeutic intervention in order to improve the following deficits and impairments:  Decreased range of motion, Decreased coordination, Decreased endurance, Impaired UE functional use, Increased muscle spasms, Decreased activity tolerance, Decreased knowledge of precautions, Pain, Hypomobility, Decreased strength, Increased edema  Visit Diagnosis: Muscle weakness (generalized)  Pain in right shoulder  Pain at surgical site     Problem List Patient Active Problem List   Diagnosis Date Noted  . Abnormal liver function tests 05/03/2015  . Left knee pain 04/08/2015  . Complete rotator cuff rupture of left shoulder 03/26/2015  . Elevated blood pressure 03/01/2015  . Fungal infection of foot 10/18/2014  . Health care maintenance 06/16/2014  . History of pancreatitis 06/12/2014  . Sinus tachycardia ([place]) 06/27/2013  . DOE (dyspnea on exertion) 06/04/2013  . Sebaceous cyst 02/05/2013  . Hyponatremia 11/04/2012  . Short-term memory  loss 05/06/2012  . Anemia 05/06/2012  . Neck fullness 03/27/2012  . Chronic back pain 12/01/2011  . Hypercholesteremia 12/01/2011  . Clinical depression 07/29/2011  . Failed back syndrome of lumbar spine 07/22/2011    Blythe Stanford, SPT 05/08/2015, 10:20 AM  [place] [facility] [address] [place], [address] Phone: [phone]   Fax:  [phone]  Name: [name] MRN: [id number] Date of Birth: 08/06/1960

## 2015-05-08 NOTE — Progress Notes (Signed)
Order placed for GI referral.   

## 2015-05-10 ENCOUNTER — Emergency Department
Admission: EM | Admit: 2015-05-10 | Discharge: 2015-05-10 | Disposition: A | Discharge status: Home / Self Care | Payer: PPO | Attending: Emergency Medicine | Admitting: Emergency Medicine

## 2015-05-10 DIAGNOSIS — Y929 Unspecified place or not applicable: Secondary | ICD-10-CM | POA: Diagnosis not present

## 2015-05-10 DIAGNOSIS — S61217A Laceration without foreign body of left little finger without damage to nail, initial encounter: Secondary | ICD-10-CM

## 2015-05-10 DIAGNOSIS — F329 Major depressive disorder, single episode, unspecified: Secondary | ICD-10-CM | POA: Diagnosis not present

## 2015-05-10 DIAGNOSIS — Y999 Unspecified external cause status: Secondary | ICD-10-CM | POA: Diagnosis not present

## 2015-05-10 DIAGNOSIS — W25XXXA Contact with sharp glass, initial encounter: Secondary | ICD-10-CM | POA: Insufficient documentation

## 2015-05-10 DIAGNOSIS — Y9389 Activity, other specified: Secondary | ICD-10-CM | POA: Insufficient documentation

## 2015-05-10 MED ORDER — BACITRACIN ZINC 500 UNIT/GM EX OINT
TOPICAL_OINTMENT | CUTANEOUS | Status: AC
  Filled 2015-05-10: qty 0.9

## 2015-05-10 MED ORDER — TRAMADOL HCL 50 MG PO TABS: 50.0000 mg | ORAL_TABLET | Freq: Four times a day (QID) | ORAL | Status: DC | PRN

## 2015-05-10 MED ORDER — BACITRACIN ZINC 500 UNIT/GM EX OINT
TOPICAL_OINTMENT | Freq: Two times a day (BID) | CUTANEOUS | Status: DC
  Administered 2015-05-10: 16:00:00 via TOPICAL

## 2015-05-10 MED ORDER — LIDOCAINE HCL (PF) 1 % IJ SOLN
INTRAMUSCULAR | Status: AC
  Filled 2015-05-10: qty 5

## 2015-05-10 NOTE — ED Notes (Signed)
States plate slipped out of his hand and cut his left pinky finger. Bleeding controled at this time

## 2015-05-10 NOTE — ED Provider Notes (Signed)
[facility] Emergency Department Provider Note  ____________________________________________  Time seen: Approximately 3:50 PM  I have reviewed the triage vital signs and the nursing notes.   HISTORY  Chief Complaint Laceration    HPI [name] is a 55 y.o. male patient is a laceration to the left fifth digit. Patient state he was cut by glass plate. Hemorrhaging is controlled with direct pressure. Patient denies loss of sensation or function of the affected digit. Patient rates his pain as 7/10. Patient described the pain as "sharp". No other palliative measures prior to arrival.   Past Medical History  Diagnosis Date  . Hypercholesterolemia   . Pancreatitis   . Chronic back pain     s/p 5 back surgeries  . Mild cognitive impairment with memory loss   . Anoxic brain injury ([place])   . Depression   . Allergy   . H/O acute pancreatitis   . Coronary artery disease     Cardiac catheterization in 2007 at [facility] showed 30% stenosis in proximal LAD. No other disease. Ejection fraction was 65%    Patient Active Problem List   Diagnosis Date Noted  . Abnormal liver function tests 05/03/2015  . Left knee pain 04/08/2015  . Complete rotator cuff rupture of left shoulder 03/26/2015  . Elevated blood pressure 03/01/2015  . Fungal infection of foot 10/18/2014  . Health care maintenance 06/16/2014  . History of pancreatitis 06/12/2014  . Sinus tachycardia ([place]) 06/27/2013  . DOE (dyspnea on exertion) 06/04/2013  . Sebaceous cyst 02/05/2013  . Hyponatremia 11/04/2012  . Short-term memory loss 05/06/2012  . Anemia 05/06/2012  . Neck fullness 03/27/2012  . Chronic back pain 12/01/2011  . Hypercholesteremia 12/01/2011  . Clinical depression 07/29/2011  . Failed back syndrome of lumbar spine 07/22/2011    Past Surgical History  Procedure Laterality Date  . Cholecystectomy  2013  . Back surgery      multiple back surgeries (5)  . Vasectomy      With  Reversal  . Spine surgery    . Vein surgery Right 2013    arm  . Cardiac catheterization      [facility]   . Trigger point injection      Current Outpatient Rx  Name  Route  Sig  Dispense  Refill  . amLODipine (NORVASC) 5 MG tablet   Oral   Take by mouth.         Marland Kitchen amphetamine-dextroamphetamine (ADDERALL) 20 MG tablet   Oral   Take 20 mg by mouth 3 (three) times daily.         . cetirizine (ZYRTEC) 10 MG tablet   Oral   Take 10 mg by mouth daily.         . diazepam (VALIUM) 5 MG tablet   Oral   Take 5-10 mg by mouth every 8 (eight) hours as needed (pain). Reported on 04/08/2015         . esomeprazole (NEXIUM) 40 MG capsule   Oral   Take 1 capsule (40 mg total) by mouth daily.   30 capsule   2   . fluticasone (FLONASE) 50 MCG/ACT nasal spray   Each Nare   Place 2 sprays into both nostrils daily.   48 g   1   . HYDROcodone-acetaminophen (NORCO) 5-325 MG tablet               . naproxen (NAPROSYN) 500 MG tablet   Oral   Take by mouth.         Marland Kitchen  oxyCODONE (OXY IR/ROXICODONE) 5 MG immediate release tablet               . oxyCODONE-acetaminophen (ROXICET) 5-325 MG tablet   Oral   Take 1 tablet by mouth every 6 (six) hours as needed for severe pain.   20 tablet   0   . tiZANidine (ZANAFLEX) 4 MG tablet   Oral   Take 4 mg by mouth 3 (three) times daily.         . traMADol (ULTRAM) 50 MG tablet   Oral   Take 1 tablet (50 mg total) by mouth every 6 (six) hours as needed for moderate pain.   12 tablet   0   . zolpidem (AMBIEN) 10 MG tablet   Oral   Take 10 mg by mouth daily.      2     Allergies Penicillins and Sulfa antibiotics  Family History  Problem Relation Age of Onset  . Heart disease Father   . Heart attack Father   . Hypertension    . Colon cancer    . Heart disease Mother   . Heart attack Mother   . Heart attack Brother     MI's x 2     Social History Social History  Substance Use Topics  . Smoking status: Never Smoker    . Smokeless tobacco: Never Used  . Alcohol Use: No    Review of Systems Constitutional: No fever/chills Eyes: No visual changes. ENT: No sore throat. Cardiovascular: Denies chest pain. Respiratory: Denies shortness of breath. Gastrointestinal: No abdominal pain.  No nausea, no vomiting.  No diarrhea.  No constipation. Genitourinary: Negative for dysuria. Musculoskeletal: Chronic back pain. Skin: Negative for rash. Laceration fifth digit left hand Neurological: Negative for headaches, focal weakness or numbness. Psychiatric:Depression Endocrine:Hyperlipidemia and pancreatitis. Hematological/Lymphatic: Allergic/Immunilogical: Penicillin and sulfa  10-point ROS otherwise negative.  ____________________________________________   PHYSICAL EXAM:  VITAL SIGNS: ED Triage Vitals  Enc Vitals Group     BP 05/10/15 1522 147/90 mmHg     Pulse --      Resp 05/10/15 1522 18     Temp 05/10/15 1522 98.7 F (37.1 C)     Temp Source 05/10/15 1522 Oral     SpO2 05/10/15 1522 98 %     Weight 05/10/15 1522 234 lb (106.142 kg)     Height 05/10/15 1522 6\' 1"  (1.854 m)     Head Cir --      Peak Flow --      Pain Score 05/10/15 1526 7     Pain Loc --      Pain Edu? --      Excl. in [place]? --     Constitutional: Alert and oriented. Well appearing and in no acute distress. Eyes: Conjunctivae are normal. PERRL. EOMI. Head: Atraumatic. Nose: No congestion/rhinnorhea. Mouth/Throat: Mucous membranes are moist.  Oropharynx non-erythematous. Neck: No stridor.  No cervical spine tenderness to palpation. Hematological/Lymphatic/Immunilogical: No cervical lymphadenopathy. Cardiovascular: Normal rate, regular rhythm. Grossly normal heart sounds.  Good peripheral circulation. Respiratory: Normal respiratory effort.  No retractions. Lungs CTAB. Gastrointestinal: Soft and nontender. No distention. No abdominal bruits. No CVA tenderness. Musculoskeletal: No lower extremity tenderness nor edema.  No  joint effusions. Neurologic:  Normal speech and language. No gross focal neurologic deficits are appreciated. No gait instability. Skin:  Skin is warm, dry and intact. No rash noted. 1 cm laceration proximal phalange fifth digit left hand. Psychiatric: Mood and affect are normal. Speech and behavior are  normal.  ____________________________________________   LABS (all labs ordered are listed, but only abnormal results are displayed)  Labs Reviewed - No data to display ____________________________________________  EKG   ____________________________________________  RADIOLOGY   ____________________________________________   PROCEDURES  Procedure(s) performed: LACERATION REPAIR Performed by: Sable Feil Authorized by: Sable Feil Consent: Verbal consent obtained. Risks and benefits: risks, benefits and alternatives were discussed Consent given by: patient Patient identity confirmed: provided demographic data Prepped and Draped in normal sterile fashion Wound explored  Laceration Location: Fifth digit left hand Laceration  Length: 1.5 cm  No Foreign Bodies seen or palpated  Anesthesia: Digital block Local anesthetic: lidocaine 1% without epinephrine  Anesthetic total: 4 ml  Irrigation method: syringe Amount of cleaning: standard  Skin closure: 4-0 nylon   Number of sutures: 4 Technique: Simple   Patient tolerance: Patient tolerated the procedure well with no immediate complications.   Critical Care performed: No  ____________________________________________   INITIAL IMPRESSION / ASSESSMENT AND PLAN / ED COURSE  Pertinent labs & imaging results that were available during my care of the patient were reviewed by me and considered in my medical decision making (see chart for details).  Laceration fifth digit left hand. Patient given discharge care instructions. Patient advised to have sutures removed in 10 days by family doctor/urgent  care. __________________________________________   FINAL CLINICAL IMPRESSION(S) / ED DIAGNOSES  Final diagnoses:  Laceration of left little finger w/o foreign body w/o damage to nail, initial encounter      Sable Feil, PA-C 05/10/15 [place], MD 05/11/15 0005

## 2015-05-10 NOTE — Discharge Instructions (Signed)
Laceration Care, Adult  A laceration is a cut that goes through all layers of the skin. The cut also goes into the tissue that is right under the skin. Some cuts heal on their own. Others need to be closed with stitches (sutures), staples, skin adhesive strips, or wound glue. Taking care of your cut lowers your risk of infection and helps your cut to heal better.  HOW TO TAKE CARE OF YOUR CUT  For stitches or staples:  · Keep the wound clean and dry.  · If you were given a bandage (dressing), you should change it at least one time per day or as told by your doctor. You should also change it if it gets wet or dirty.  · Keep the wound completely dry for the first 24 hours or as told by your doctor. After that time, you may take a shower or a bath. However, make sure that the wound is not soaked in water until after the stitches or staples have been removed.  · Clean the wound one time each day or as told by your doctor:    Wash the wound with soap and water.    Rinse the wound with water until all of the soap comes off.    Pat the wound dry with a clean towel. Do not rub the wound.  · After you clean the wound, put a thin layer of antibiotic ointment on it as told by your doctor. This ointment:    Helps to prevent infection.    Keeps the bandage from sticking to the wound.  · Have your stitches or staples removed as told by your doctor.  If your doctor used skin adhesive strips:   · Keep the wound clean and dry.  · If you were given a bandage, you should change it at least one time per day or as told by your doctor. You should also change it if it gets dirty or wet.  · Do not get the skin adhesive strips wet. You can take a shower or a bath, but be careful to keep the wound dry.  · If the wound gets wet, pat it dry with a clean towel. Do not rub the wound.  · Skin adhesive strips fall off on their own. You can trim the strips as the wound heals. Do not remove any strips that are still stuck to the wound. They will  fall off after a while.  If your doctor used wound glue:  · Try to keep your wound dry, but you may briefly wet it in the shower or bath. Do not soak the wound in water, such as by swimming.  · After you take a shower or a bath, gently pat the wound dry with a clean towel. Do not rub the wound.  · Do not do any activities that will make you really sweaty until the skin glue has fallen off on its own.  · Do not apply liquid, cream, or ointment medicine to your wound while the skin glue is still on.  · If you were given a bandage, you should change it at least one time per day or as told by your doctor. You should also change it if it gets dirty or wet.  · If a bandage is placed over the wound, do not let the tape for the bandage touch the skin glue.  · Do not pick at the glue. The skin glue usually stays on for 5-10 days. Then, it   falls off of the skin.  General Instructions   · To help prevent scarring, make sure to cover your wound with sunscreen whenever you are outside after stitches are removed, after adhesive strips are removed, or when wound glue stays in place and the wound is healed. Make sure to wear a sunscreen of at least 30 SPF.  · Take over-the-counter and prescription medicines only as told by your doctor.  · If you were given antibiotic medicine or ointment, take or apply it as told by your doctor. Do not stop using the antibiotic even if your wound is getting better.  · Do not scratch or pick at the wound.  · Keep all follow-up visits as told by your doctor. This is important.  · Check your wound every day for signs of infection. Watch for:    Redness, swelling, or pain.    Fluid, blood, or pus.  · Raise (elevate) the injured area above the level of your heart while you are sitting or lying down, if possible.  GET HELP IF:  · You got a tetanus shot and you have any of these problems at the injection site:    Swelling.    Very bad pain.    Redness.    Bleeding.  · You have a fever.  · A wound that was  closed breaks open.  · You notice a bad smell coming from your wound or your bandage.  · You notice something coming out of the wound, such as wood or glass.  · Medicine does not help your pain.  · You have more redness, swelling, or pain at the site of your wound.  · You have fluid, blood, or pus coming from your wound.  · You notice a change in the color of your skin near your wound.  · You need to change the bandage often because fluid, blood, or pus is coming from the wound.  · You start to have a new rash.  · You start to have numbness around the wound.  GET HELP RIGHT AWAY IF:  · You have very bad swelling around the wound.  · Your pain suddenly gets worse and is very bad.  · You notice painful lumps near the wound or on skin that is anywhere on your body.  · You have a red streak going away from your wound.  · The wound is on your hand or foot and you cannot move a finger or toe like you usually can.  · The wound is on your hand or foot and you notice that your fingers or toes look pale or bluish.     This information is not intended to replace advice given to you by your health care provider. Make sure you discuss any questions you have with your health care provider.     Document Released: 07/06/2007 Document Revised: 06/03/2014 Document Reviewed: 01/13/2014  Elsevier Interactive Patient Education ©2016 [employer]

## 2015-05-11 ENCOUNTER — Encounter: Payer: Self-pay | Admitting: Physical Therapy

## 2015-05-11 ENCOUNTER — Ambulatory Visit: Payer: PPO | Admitting: Physical Therapy

## 2015-05-11 DIAGNOSIS — R29898 Other symptoms and signs involving the musculoskeletal system: Secondary | ICD-10-CM | POA: Diagnosis not present

## 2015-05-11 DIAGNOSIS — M6281 Muscle weakness (generalized): Secondary | ICD-10-CM

## 2015-05-11 DIAGNOSIS — G8918 Other acute postprocedural pain: Secondary | ICD-10-CM

## 2015-05-11 DIAGNOSIS — M25511 Pain in right shoulder: Secondary | ICD-10-CM

## 2015-05-11 NOTE — Therapy (Signed)
[facility] [address], [place], [address] Phone: [phone]   Fax:  [phone]  Physical Therapy Treatment  Patient Details  Name: [name] MRN: [id number] Date of Birth: Aug 02, 1960 Referring Provider: Thomasene Lot. Johna Roles M.D.  Encounter Date: 05/11/2015      PT End of Session - 05/11/15 1126    Visit Number 6   Number of Visits 12   Date for PT Re-Evaluation 05/28/15   Authorization Type 6   Authorization Time Period 10 G code   PT Start Time 0845   PT Stop Time 0930   PT Time Calculation (min) 45 min   Equipment Utilized During Treatment Other (comment)  Arm sling   Activity Tolerance Patient tolerated treatment well;Patient limited by pain   Behavior During Therapy [facility] for tasks assessed/performed      Past Medical History  Diagnosis Date  . Hypercholesterolemia   . Pancreatitis   . Chronic back pain     s/p 5 back surgeries  . Mild cognitive impairment with memory loss   . Anoxic brain injury ([place])   . Depression   . Allergy   . H/O acute pancreatitis   . Coronary artery disease     Cardiac catheterization in 2007 at [facility] showed 30% stenosis in proximal LAD. No other disease. Ejection fraction was 65%    Past Surgical History  Procedure Laterality Date  . Cholecystectomy  2013  . Back surgery      multiple back surgeries (5)  . Vasectomy      With Reversal  . Spine surgery    . Vein surgery Right 2013    arm  . Cardiac catheterization      [facility]   . Trigger point injection      There were no vitals filed for this visit.      Subjective Assessment - 05/11/15 0848    Subjective Patient reports going to the ER for cutting his finger on his left hand after dropping a plate, he states he "jerked" his right shoulder trying to catch the falling plate. States the shoulder symptoms have not increased since the incident.    Pertinent History Surgery on 04/13/2015: R shoulder scope,  capsular release, subacromial decompression, labral debridement, open biceps tenodesis, open rotator cuff repair. Five previous back repair surgeries and reports constant LBP. Patient reports recent MRI shows mensical tearing, and ACL injury involvement. Previous TBI .   Limitations Lifting;Walking   Patient Stated Goals To increase ability to move arm overhead and decrease shoulder pain.   Currently in Pain? Yes   Pain Score 8    Pain Location Shoulder   Pain Orientation Right   Pain Descriptors / Indicators Aching;Stabbing   Pain Type Surgical pain      Objective: Palpation: Increased tenderness and spasms along scapular external rotators, scapular retractors, upper trap, pectoralis major on the right upper arm. No redness noted over anterior aspect of the elbow.   Measurement:  Shoulder PROM: Flexion: 90 (~60 pre treatment), ER: 5 Elbow PROM: Flexion: 130, Ext: 40 -- Did not perform through full range to decrease strain on biceps musculature  Treatment: Therapeutic Exercise: Patient performed exercises with guidance, verbal and tactile cues and demonstration of therapist: Scapular retraction in sitting and supine-- x5, x 10 (Performed without UE movement through shortened range)  Manual Therapy:  PROM: shoulder flexion to ~85/95 degrees following repetition -- 2 x 5; elbow flexion to 40-130 (in pronation)--  x 5; Shoulder external rotation -- x 5 (To neutral: pain limited further ROM) Soft tissue mobilization to upper traps, shoulder external rotations, upper arm, pectoralis major and scapular retraction with patient in supine position with arm supported on towels to decreased muscle spasms and pain. Suboccipital release performed in supine to decrease spasms and pain.  Modalities: Electrical stimulation: Interferencial current via clinical protocol for acute pain with patient positioned in sitting with arm supported with pillow and 4 electrodes placed on right shoulder over the  upper trap and lateral/anterior and posterior aspect of the shoulder. Ice placed over right shoulder for 15 minutes with estim.. No adverse skin reactions noted.   Patient response to treatment: Decreased pain to 6/10 after performing treatment today. Improved demonstration of motor control when performing shoulder PROM today requiring minimal cueing to not activate anterior shoulder musculature. Improved PROM to >95 degrees with repetition and cueing.         PT Education - 05/11/15 1125    Education provided Yes   Education Details Continueing HEP, educating on plan of care for future AAROM shoulder use   Person(s) Educated Patient   Methods Explanation;Demonstration   Comprehension Verbalized understanding;Returned demonstration             PT Long Term Goals - 05/01/15 0756    PT LONG TERM GOAL #1   Title Pt will improve QuickDASH score to <60% by 05/28/15 to demonstrate functional improvement right shoulder function and allow for improvement of shoulder movement.    Baseline QuickDASH: 92%   Status New   PT LONG TERM GOAL #2   Title Pt will be independent with HEP focused on regaining passive shoulder ROM and scapular mobility/ control to allow for progression of exercises per protocol by 05/28/2015   Baseline Dependent requiring constant cueing for progression and exercise performance.    Status New   PT LONG TERM GOAL #3   Title Patient will report decreased pain level to 4/10 max. in right shoulder to allow improved ROM/movement by 05/28/15 to be able to perform personal care with less difficulty   Baseline patient reports maximal pain level of 9/10   Status New               Plan - 05/11/15 0925    Clinical Impression Statement Pt continues to improve shoulder flexion PROM indicating functional carryover between visits and improving muscle spasms. Patient only experienced increased pain at end range of shoulder PROM today. He will benefit from further skilled to  return to prior level of function.    Rehab Potential Good   Clinical Impairments Affecting Rehab Potential (+): highly motivated, age, acute condition (-) previous orthopedic surgeries, chronic pain   PT Frequency 3x / week   PT Duration 4 weeks   PT Treatment/Interventions Neuromuscular re-education;Aquatic Therapy;Cryotherapy;Electrical Stimulation;Iontophoresis 4mg /ml Dexamethasone;Moist Heat;Therapeutic exercise;Therapeutic activities;Ultrasound;Patient/family education;Manual techniques;Passive range of motion;Scar mobilization   PT Next Visit Plan Continue PROM   PT Home Exercise Plan Pendulums, scapular retractions, hand and wrist exercises   Consulted and Agree with Plan of Care Patient      Patient will benefit from skilled therapeutic intervention in order to improve the following deficits and impairments:  Decreased range of motion, Decreased coordination, Decreased endurance, Impaired UE functional use, Increased muscle spasms, Decreased activity tolerance, Decreased knowledge of precautions, Pain, Hypomobility, Decreased strength, Increased edema  Visit Diagnosis: Muscle weakness (generalized)  Pain in right shoulder  Pain at surgical site     Problem List Patient  Active Problem List   Diagnosis Date Noted  . Abnormal liver function tests 05/03/2015  . Left knee pain 04/08/2015  . Complete rotator cuff rupture of left shoulder 03/26/2015  . Elevated blood pressure 03/01/2015  . Fungal infection of foot 10/18/2014  . Health care maintenance 06/16/2014  . History of pancreatitis 06/12/2014  . Sinus tachycardia ([place]) 06/27/2013  . DOE (dyspnea on exertion) 06/04/2013  . Sebaceous cyst 02/05/2013  . Hyponatremia 11/04/2012  . Short-term memory loss 05/06/2012  . Anemia 05/06/2012  . Neck fullness 03/27/2012  . Chronic back pain 12/01/2011  . Hypercholesteremia 12/01/2011  . Clinical depression 07/29/2011  . Failed back syndrome of lumbar spine 07/22/2011     Blythe Stanford, SPT 05/11/2015, 11:29 AM  [facility] [address] [place], [address] Phone: [phone]   Fax:  [phone]  Name: [name] MRN: [id number] Date of Birth: 1960-07-29

## 2015-05-13 ENCOUNTER — Encounter: Payer: Self-pay | Admitting: Physical Therapy

## 2015-05-13 ENCOUNTER — Ambulatory Visit: Payer: PPO | Admitting: Physical Therapy

## 2015-05-13 ENCOUNTER — Other Ambulatory Visit: Payer: Self-pay | Admitting: Internal Medicine

## 2015-05-13 DIAGNOSIS — R945 Abnormal results of liver function studies: Secondary | ICD-10-CM

## 2015-05-13 DIAGNOSIS — M25511 Pain in right shoulder: Secondary | ICD-10-CM

## 2015-05-13 DIAGNOSIS — R29898 Other symptoms and signs involving the musculoskeletal system: Secondary | ICD-10-CM | POA: Diagnosis not present

## 2015-05-13 DIAGNOSIS — M6281 Muscle weakness (generalized): Secondary | ICD-10-CM

## 2015-05-13 DIAGNOSIS — R7989 Other specified abnormal findings of blood chemistry: Secondary | ICD-10-CM

## 2015-05-13 NOTE — Therapy (Signed)
[facility] [address] [place], [address] Phone: [phone]   Fax:  [phone]  Physical Therapy Treatment  Patient Details  Name: [name] MRN: [id number] Date of Birth: [birth year] Referring Provider: Thomasene Lot. Johna Roles M.D.  Encounter Date: 05/13/2015      PT End of Session - 05/13/15 1031    Visit Number 7   Number of Visits 12   Date for PT Re-Evaluation 05/28/15   Authorization Type 7   Authorization Time Period 10 G code   PT Start Time 0801   PT Stop Time 0851   PT Time Calculation (min) 50 min   Equipment Utilized During Treatment Other (comment)  Arm sling   Activity Tolerance Patient tolerated treatment well;Patient limited by pain   Behavior During Therapy [facility] for tasks assessed/performed      Past Medical History  Diagnosis Date  . Hypercholesterolemia   . Pancreatitis   . Chronic back pain     s/p 5 back surgeries  . Mild cognitive impairment with memory loss   . Anoxic brain injury ([place])   . Depression   . Allergy   . H/O acute pancreatitis   . Coronary artery disease     Cardiac catheterization in 2007 at [facility] showed 30% stenosis in proximal LAD. No other disease. Ejection fraction was 65%    Past Surgical History  Procedure Laterality Date  . Cholecystectomy  2013  . Back surgery      multiple back surgeries (5)  . Vasectomy      With Reversal  . Spine surgery    . Vein surgery Right 2013    arm  . Cardiac catheterization      [facility]   . Trigger point injection      There were no vitals filed for this visit.      Subjective Assessment - 05/13/15 1025    Subjective Patient reports increased shoulder pain today after previous treatment and states his shoulder is feeling painful and tight around the attachment of the pec onto the humerus.    Pertinent History Surgery on 04/13/2015: R shoulder scope, capsular release, subacromial decompression, labral debridement,  open biceps tenodesis, open rotator cuff repair. Five previous back repair surgeries and reports constant LBP. Patient reports recent MRI shows mensical tearing, and ACL injury involvement. Previous TBI .   Limitations Lifting;Walking   Patient Stated Goals To increase ability to move arm overhead and decrease shoulder pain.   Currently in Pain? Yes   Pain Score 9    Pain Location Shoulder   Pain Orientation Right   Pain Descriptors / Indicators Aching;Stabbing   Pain Type Surgical pain   Pain Onset More than a month ago      Objective: Palpation: Increased tenderness and spasms along scapular external rotators, scapular retractors, upper trap, pectoralis major on the right upper arm. No redness noted over anterior aspect of the elbow.   Measurement:  Shoulder PROM: Flexion: 90, ER: 0 Elbow PROM: Flexion: 130, Ext: 40 -- Did not perform through full range to decrease strain on biceps musculature  Treatment: Therapeutic Exercise: Patient performed exercises with guidance, verbal and tactile cues and demonstration of therapist: Scapular retraction in supine-- x5, x 10 (Performed without UE movement through shortened range) PROM/AAROM: shoulder flexion to ~85/95 degrees following repetition -- 2 x 5; elbow flexion to 40-130 (in pronation)-- x 10; Shoulder external rotation -- x 10 (To neutral: pain limited further ROM)  Manual Therapy:  Soft tissue mobilization to upper traps, shoulder external rotations, upper arm, pectoralis major and scapular retraction with patient in supine position with arm supported on towels to decreased muscle spasms and pain. Suboccipital release performed in supine to decrease spasms and pain.  Modalities: Electrical stimulation: High volt via clinical protocol for muscle spasms with patient positioned in sitting with arm supported with pillow and 2 electrodes placed on right shoulder over the upper trap and lateral/anterior aspect of the shoulder. Russian  stim, 10/10, placed along scapular retractors and lower traps. Ice placed over right shoulder for 15 minutes with estim. No adverse skin reactions noted.   Patient response to treatment: Decreased pain to 7/10 after performing treatment today indicating improved tissue elasticity. Initial decrease in AAROM/PROM shoulder flexion range of motion is  improved after performing further repetitions.          PT Education - 05/13/15 1028    Education provided Yes   Education Details Continue current HEP and educated on moving arm at home   Person(s) Educated Patient   Methods Explanation   Comprehension Verbalized understanding             PT Long Term Goals - 05/01/15 0756    PT LONG TERM GOAL #1   Title Pt will improve QuickDASH score to <60% by 05/28/15 to demonstrate functional improvement right shoulder function and allow for improvement of shoulder movement.    Baseline QuickDASH: 92%   Status New   PT LONG TERM GOAL #2   Title Pt will be independent with HEP focused on regaining passive shoulder ROM and scapular mobility/ control to allow for progression of exercises per protocol by 05/28/2015   Baseline Dependent requiring constant cueing for progression and exercise performance.    Status New   PT LONG TERM GOAL #3   Title Patient will report decreased pain level to 4/10 max. in right shoulder to allow improved ROM/movement by 05/28/15 to be able to perform personal care with less difficulty   Baseline patient reports maximal pain level of 9/10   Status New               Plan - 05/13/15 1508    Clinical Impression Statement Introduced shoulder AAROM today and patient demonstrated improved ability to raise arm overhead with AAROM versus PROM. Increased muscle spasms upon returning to treatment and focused on decreasing initial spasms/guarding to allow for therapy to be performed. Although pt is improving he continues with significant impairments and will benefit from  futther skilled therapy to return to prior level of function.    Rehab Potential Good   Clinical Impairments Affecting Rehab Potential (+): highly motivated, age, acute condition (-) previous orthopedic surgeries, chronic pain   PT Frequency 3x / week   PT Duration 4 weeks   PT Treatment/Interventions Neuromuscular re-education;Aquatic Therapy;Cryotherapy;Electrical Stimulation;Iontophoresis 4mg /ml Dexamethasone;Moist Heat;Therapeutic exercise;Therapeutic activities;Ultrasound;Patient/family education;Manual techniques;Passive range of motion;Scar mobilization   PT Next Visit Plan Continue PROM   PT Home Exercise Plan Pendulums, scapular retractions, hand and wrist exercises   Consulted and Agree with Plan of Care Patient      Patient will benefit from skilled therapeutic intervention in order to improve the following deficits and impairments:  Decreased range of motion, Decreased coordination, Decreased endurance, Impaired UE functional use, Increased muscle spasms, Decreased activity tolerance, Decreased knowledge of precautions, Pain, Hypomobility, Decreased strength, Increased edema  Visit Diagnosis: Muscle weakness (generalized)  Pain in right shoulder     Problem List  Patient Active Problem List   Diagnosis Date Noted  . Abnormal liver function tests 05/03/2015  . Left knee pain 04/08/2015  . Complete rotator cuff rupture of left shoulder 03/26/2015  . Elevated blood pressure 03/01/2015  . Fungal infection of foot 10/18/2014  . Health care maintenance 06/16/2014  . History of pancreatitis 06/12/2014  . Sinus tachycardia ([place]) 06/27/2013  . DOE (dyspnea on exertion) 06/04/2013  . Sebaceous cyst 02/05/2013  . Hyponatremia 11/04/2012  . Short-term memory loss 05/06/2012  . Anemia 05/06/2012  . Neck fullness 03/27/2012  . Chronic back pain 12/01/2011  . Hypercholesteremia 12/01/2011  . Clinical depression 07/29/2011  . Failed back syndrome of lumbar spine 07/22/2011     Blythe Stanford, SPT 05/13/2015, 3:14 PM  [facility] [address], [place], [address] Phone: [phone]   Fax:  [phone]  Name: [name] MRN: [id number] Date of Birth: [birth year]

## 2015-05-13 NOTE — Progress Notes (Signed)
Orders placed for f/u liver panel and hepatitis tests.

## 2015-05-14 ENCOUNTER — Other Ambulatory Visit (INDEPENDENT_AMBULATORY_CARE_PROVIDER_SITE_OTHER): Payer: PPO

## 2015-05-14 ENCOUNTER — Ambulatory Visit: Payer: PPO | Admitting: Physical Therapy

## 2015-05-14 ENCOUNTER — Encounter: Payer: Self-pay | Admitting: Physical Therapy

## 2015-05-14 DIAGNOSIS — R29898 Other symptoms and signs involving the musculoskeletal system: Secondary | ICD-10-CM | POA: Diagnosis not present

## 2015-05-14 DIAGNOSIS — R945 Abnormal results of liver function studies: Secondary | ICD-10-CM

## 2015-05-14 DIAGNOSIS — R7989 Other specified abnormal findings of blood chemistry: Secondary | ICD-10-CM | POA: Diagnosis not present

## 2015-05-14 DIAGNOSIS — M25511 Pain in right shoulder: Secondary | ICD-10-CM

## 2015-05-14 DIAGNOSIS — M6281 Muscle weakness (generalized): Secondary | ICD-10-CM

## 2015-05-14 LAB — HEPATIC FUNCTION PANEL
ALT: 38 U/L (ref 0–53)
AST: 23 U/L (ref 0–37)
Albumin: 4.6 g/dL (ref 3.5–5.2)
Alkaline Phosphatase: 125 U/L — ABNORMAL HIGH (ref 39–117)
Bilirubin, Direct: 0.1 mg/dL (ref 0.0–0.3)
Total Bilirubin: 0.6 mg/dL (ref 0.2–1.2)
Total Protein: 7.8 g/dL (ref 6.0–8.3)

## 2015-05-14 NOTE — Therapy (Signed)
[facility] [address] [place], [address] Phone: [phone]   Fax:  [phone]  Physical Therapy Treatment  Patient Details  Name: [name] MRN: [id number] Date of Birth: 07-Feb-1960 Referring Provider: Thomasene Lot. Johna Roles M.D.  Encounter Date: 05/14/2015      PT End of Session - 05/14/15 1606    Visit Number 8   Number of Visits 12   Date for PT Re-Evaluation 05/28/15   Authorization Type 8   Authorization Time Period 10 G code   PT Start Time 1600   PT Stop Time 1710   PT Time Calculation (min) 70 min   Equipment Utilized During Treatment Other (comment)  using sling with pillow   Activity Tolerance Patient tolerated treatment well;Patient limited by pain   Behavior During Therapy WFL for tasks assessed/performed      Past Medical History  Diagnosis Date  . Hypercholesterolemia   . Pancreatitis   . Chronic back pain     s/p 5 back surgeries  . Mild cognitive impairment with memory loss   . Anoxic brain injury ([place])   . Depression   . Allergy   . H/O acute pancreatitis   . Coronary artery disease     Cardiac catheterization in 2007 at [facility] showed 30% stenosis in proximal LAD. No other disease. Ejection fraction was 65%    Past Surgical History  Procedure Laterality Date  . Cholecystectomy  2013  . Back surgery      multiple back surgeries (5)  . Vasectomy      With Reversal  . Spine surgery    . Vein surgery Right 2013    arm  . Cardiac catheterization      [facility]   . Trigger point injection      There were no vitals filed for this visit.      Subjective Assessment - 05/14/15 1601    Subjective Patient reports he continues to feel pain, tightness and stiffness in the right shoulder. He is limited in ability to take pain medication at night due to responsibilities during the day with driving. He reports he is seeing some improvement with physical therapy intervention.    Limitations Lifting   Patient Stated Goals To increase ability to move arm overhead and decrease shoulder pain.   Currently in Pain? Yes   Pain Score 9    Pain Location Shoulder   Pain Orientation Right   Pain Descriptors / Indicators Aching;Tightness;Sore;Spasm   Pain Type Surgical pain   Pain Onset More than a month ago  04/13/2015      Objective: Palpation: Increased tenderness and spasms along right  upper trap, pectoralis major on the right upper arm, TrP's palpable in upper trapezius   Measurement:  Shoulder PROM: Flexion: 90, ER: 0 Elbow PROM: Flexion: 130, Ext: 40 -- Did not perform through full range to decrease strain on biceps musculature  Treatment: Therapeutic Exercise: Patient performed exercises with guidance, verbal and tactile cues and demonstration of therapist: Scapular retraction in supine-- x5, x 10 (Performed without UE movement through shortened range) PROM/AAROM: shoulder flexion to ~85/95 degrees following repetition -- 2 x 5; elbow flexion to 40-130 (in pronation)-- x 10; Shoulder external rotation -- x 10 (To neutral: pain limited further ROM) Side lying: right upper trapezius release x 3 (20 seconds), scapular control elevation/depression with tactile cues x 5 reps, scapular retraction x 5 reps, scapular mobilization with rotation x 5 reps  Manual  Therapy:  Soft tissue mobilization to upper traps, shoulder external rotations, upper arm, pectoralis major and scapular retraction with patient in supine position with arm supported on towels to decrease muscle spasms and pain.  GHJ gentle grade 1 mobilization: lateral distraction to decrease pain (patient supine)   Modalities: Ultrasound:  1MHz 50% pulsed, 1.4w/cm2 applied x 10 min. Over right shoulder/upper trapezius with patient seated followed by there. Ex. Electrical stimulation:(following there. Ex/STM) High volt: clinical protocol for muscle spasms with patient positioned in sitting with arm supported  with pillow and 2 electrodes placed on right shoulder over the upper trap and lateral/anterior aspect of the shoulder. Russian stim, 10/10, placed along scapular retractors and lower traps. Ice placed over right shoulder for 15 minutes with estim. No adverse skin reactions noted.   Patient response to treatment: Decreased pain to 5/10 after performing treatment today and decreased spasms with less tenderness by at least 50% following US/estim. g further repetitions.         PT Education - 05/14/15 1604    Education provided Yes   Education Details continue with AAROM, scapular adduction and pain control, place washcloth under right axilla to assist with pain control   Person(s) Educated Patient   Methods Explanation   Comprehension Verbalized understanding             PT Long Term Goals - 05/01/15 0756    PT LONG TERM GOAL #1   Title Pt will improve QuickDASH score to <60% by 05/28/15 to demonstrate functional improvement right shoulder function and allow for improvement of shoulder movement.    Baseline QuickDASH: 92%   Status New   PT LONG TERM GOAL #2   Title Pt will be independent with HEP focused on regaining passive shoulder ROM and scapular mobility/ control to allow for progression of exercises per protocol by 05/28/2015   Baseline Dependent requiring constant cueing for progression and exercise performance.    Status New   PT LONG TERM GOAL #3   Title Patient will report decreased pain level to 4/10 max. in right shoulder to allow improved ROM/movement by 05/28/15 to be able to perform personal care with less difficulty   Baseline patient reports maximal pain level of 9/10   Status New               Plan - 05/14/15 1730    Clinical Impression Statement Patient responded well to treatment with decreased spasms with [place] and decreased pain with estim. He is progressing with improved ability to relax, allowing improved ROM and working on scapular control and strength.  He continues with pain as his primary limiting factor to achieving more ROM. As pain subsides he should continue to improve.    Rehab Potential Good   PT Frequency 3x / week   PT Duration 4 weeks   PT Treatment/Interventions Neuromuscular re-education;Cryotherapy;Electrical Stimulation;Iontophoresis 4mg /ml Dexamethasone;Moist Heat;Therapeutic exercise;Therapeutic activities;Ultrasound;Patient/family education;Manual techniques;Passive range of motion;Scar mobilization   PT Next Visit Plan AAROM/PROM, modalities for pain control, scapular control   PT Home Exercise Plan AAROM supine, scapular control exercise supine and side lying      Patient will benefit from skilled therapeutic intervention in order to improve the following deficits and impairments:  Decreased range of motion, Decreased coordination, Decreased endurance, Impaired UE functional use, Increased muscle spasms, Decreased activity tolerance, Decreased knowledge of precautions, Pain, Hypomobility, Decreased strength, Increased edema  Visit Diagnosis: Muscle weakness (generalized)  Pain in right shoulder     Problem List Patient Active  Problem List   Diagnosis Date Noted  . Abnormal liver function tests 05/03/2015  . Left knee pain 04/08/2015  . Complete rotator cuff rupture of left shoulder 03/26/2015  . Elevated blood pressure 03/01/2015  . Fungal infection of foot 10/18/2014  . Health care maintenance 06/16/2014  . History of pancreatitis 06/12/2014  . Sinus tachycardia ([place]) 06/27/2013  . DOE (dyspnea on exertion) 06/04/2013  . Sebaceous cyst 02/05/2013  . Hyponatremia 11/04/2012  . Short-term memory loss 05/06/2012  . Anemia 05/06/2012  . Neck fullness 03/27/2012  . Chronic back pain 12/01/2011  . Hypercholesteremia 12/01/2011  . Clinical depression 07/29/2011  . Failed back syndrome of lumbar spine 07/22/2011    Jomarie Longs PT 05/15/2015, 2:43 PM  [facility] [address] [place], [address] Phone: [phone]   Fax:  [phone]  Name: [name] MRN: [id number] Date of Birth: October 14, 1960

## 2015-05-15 LAB — HEPATITIS B CORE ANTIBODY, TOTAL: Hep B Core Total Ab: NONREACTIVE

## 2015-05-15 LAB — HEPATITIS C ANTIBODY: HCV Ab: NEGATIVE

## 2015-05-15 LAB — HEPATITIS B CORE ANTIBODY, IGM: HEP B C IGM: NONREACTIVE

## 2015-05-15 LAB — HEPATITIS B SURFACE ANTIGEN: Hepatitis B Surface Ag: NEGATIVE

## 2015-05-15 LAB — HEPATITIS B SURFACE ANTIBODY,QUALITATIVE: HEP B S AB: NEGATIVE

## 2015-05-16 ENCOUNTER — Encounter: Payer: Self-pay | Admitting: Internal Medicine

## 2015-05-18 ENCOUNTER — Ambulatory Visit: Payer: PPO | Admitting: Physical Therapy

## 2015-05-18 ENCOUNTER — Encounter: Payer: Self-pay | Admitting: Physical Therapy

## 2015-05-18 DIAGNOSIS — M6281 Muscle weakness (generalized): Secondary | ICD-10-CM

## 2015-05-18 DIAGNOSIS — R29898 Other symptoms and signs involving the musculoskeletal system: Secondary | ICD-10-CM | POA: Diagnosis not present

## 2015-05-18 DIAGNOSIS — M25511 Pain in right shoulder: Secondary | ICD-10-CM

## 2015-05-18 LAB — HEPATITIS B E ANTIGEN: HEPATITIS BE ANTIGEN: NONREACTIVE

## 2015-05-18 NOTE — Therapy (Signed)
[facility] [address] [place], [address] Phone: [phone]   Fax:  [phone]  Physical Therapy Treatment  Patient Details  Name: [name] MRN: [id number] Date of Birth: 01-02-1961 Referring Provider: Thomasene Lot. Johna Roles M.D.  Encounter Date: 05/18/2015      PT End of Session - 05/18/15 0945    Visit Number 9   Number of Visits 12   Date for PT Re-Evaluation 05/28/15   Authorization Type 9   Authorization Time Period 10 G code   PT Start Time 0900   PT Stop Time 0948   PT Time Calculation (min) 48 min   Activity Tolerance Patient tolerated treatment well;Patient limited by pain   Behavior During Therapy [facility] for tasks assessed/performed      Past Medical History  Diagnosis Date  . Hypercholesterolemia   . Pancreatitis   . Chronic back pain     s/p 5 back surgeries  . Mild cognitive impairment with memory loss   . Anoxic brain injury ([place])   . Depression   . Allergy   . H/O acute pancreatitis   . Coronary artery disease     Cardiac catheterization in 2007 at [facility] showed 30% stenosis in proximal LAD. No other disease. Ejection fraction was 65%    Past Surgical History  Procedure Laterality Date  . Cholecystectomy  2013  . Back surgery      multiple back surgeries (5)  . Vasectomy      With Reversal  . Spine surgery    . Vein surgery Right 2013    arm  . Cardiac catheterization      [facility]   . Trigger point injection      There were no vitals filed for this visit.      Subjective Assessment - 05/18/15 0902    Subjective Patient reports he is getting out a little more. He still has to use pain medication steadily to assist with pain control.    Limitations Lifting;Other (comment)  personal care, limited per protocol   Patient Stated Goals To increase ability to move arm overhead and decrease shoulder pain.   Currently in Pain? Yes   Pain Score 8   lowest pain level 6/10 with  pain medication   Pain Location Shoulder   Pain Orientation Right   Pain Descriptors / Indicators Aching;Tightness;Sore;Spasm   Pain Onset More than a month ago  04/13/2015   Pain Frequency Constant        Objective: Palpation: Increased tenderness and spasms along right upper trap, pectoralis major on the right upper arm, TrP's palpable in upper trapezius   Measurement:  Shoulder PROM: Flexion: 110, ER: 0   Treatment: Therapeutic Exercise: Patient performed exercises with guidance, verbal and tactile cues and demonstration of therapist: Scapular retraction in supine/side lying with tactile cues 5 reps each PROM/AAROM: shoulder flexion to 110 degrees following repetition -- 2 x 5; elbow flexion to 40-130 (in pronation)-- x 10; Shoulder external rotation -- x 10 (To neutral: pain limited further ROM) Side lying: right upper trapezius release x 3 (10 seconds), scapular control elevation/depression with tactile cues x 10 reps, scapular retraction x 5 reps, scapular mobilization with rotation x 5 reps  Manual Therapy:  Soft tissue mobilization to upper traps, shoulder external rotations, upper arm, pectoralis major and scapular retraction with patient in supine position with arm supported on towels to decrease muscle spasms and pain.  GHJ gentle grade 1 mobilization:  lateral distraction to decrease pain (patient supine)   Modalities: Ultrasound:  1MHz 50% pulsed, 1.4w/cm2 applied x 10 min. Over right shoulder/upper trapezius with patient seated followed by there. Ex. Electrical stimulation:(following there. Ex/STM) High volt: clinical protocol for muscle spasms with patient positioned in sitting with arm supported with pillow and 2 electrodes placed on right shoulder over the upper trap and lateral/anterior aspect of the shoulder. Russian stim, 10/10, placed along scapular retractors and lower traps. Ice placed over right shoulder for 15 minutes with estim. No adverse skin reactions  noted.   Patient response to treatment: Decreased pain to ~5/10 after performing treatment today and decreased spasms with less tenderness by at least 50% following US/estim.  Patient able to complete exercises with less difficulty with repetition and verbal and tactile cues, improved AAROM to 110 degrees forward elevation in supine lying following treatment          PT Long Term Goals - 05/01/15 0756    PT LONG TERM GOAL #1   Title Pt will improve QuickDASH score to <60% by 05/28/15 to demonstrate functional improvement right shoulder function and allow for improvement of shoulder movement.    Baseline QuickDASH: 92%   Status New   PT LONG TERM GOAL #2   Title Pt will be independent with HEP focused on regaining passive shoulder ROM and scapular mobility/ control to allow for progression of exercises per protocol by 05/28/2015   Baseline Dependent requiring constant cueing for progression and exercise performance.    Status New   PT LONG TERM GOAL #3   Title Patient will report decreased pain level to 4/10 max. in right shoulder to allow improved ROM/movement by 05/28/15 to be able to perform personal care with less difficulty   Baseline patient reports maximal pain level of 9/10   Status New               Plan - 05/18/15 1152    Clinical Impression Statement Patient is progressing with decreasing spasms and improving AAROM with right shoulder elevation with less difficulty and pain/spasms. He conitnues with significant pain level and weakness s/p RTC surgery and will benefit from additional physical therpay interveniton to achieve goals.    Rehab Potential Good   PT Frequency 3x / week   PT Duration 4 weeks   PT Treatment/Interventions Neuromuscular re-education;Cryotherapy;Electrical Stimulation;Iontophoresis 4mg /ml Dexamethasone;Moist Heat;Therapeutic exercise;Therapeutic activities;Ultrasound;Patient/family education;Manual techniques;Passive range of motion;Scar mobilization    PT Next Visit Plan AAROM/PROM, modalities for pain control, scapular control   PT Home Exercise Plan AAROM supine, scapular control exercise supine and side lying      Patient will benefit from skilled therapeutic intervention in order to improve the following deficits and impairments:  Decreased range of motion, Decreased coordination, Decreased endurance, Impaired UE functional use, Increased muscle spasms, Decreased activity tolerance, Decreased knowledge of precautions, Pain, Hypomobility, Decreased strength, Increased edema  Visit Diagnosis: Muscle weakness (generalized)  Pain in right shoulder     Problem List Patient Active Problem List   Diagnosis Date Noted  . Abnormal liver function tests 05/03/2015  . Left knee pain 04/08/2015  . Complete rotator cuff rupture of left shoulder 03/26/2015  . Elevated blood pressure 03/01/2015  . Fungal infection of foot 10/18/2014  . Health care maintenance 06/16/2014  . History of pancreatitis 06/12/2014  . Sinus tachycardia ([place]) 06/27/2013  . DOE (dyspnea on exertion) 06/04/2013  . Sebaceous cyst 02/05/2013  . Hyponatremia 11/04/2012  . Short-term memory loss 05/06/2012  . Anemia 05/06/2012  .  Neck fullness 03/27/2012  . Chronic back pain 12/01/2011  . Hypercholesteremia 12/01/2011  . Clinical depression 07/29/2011  . Failed back syndrome of lumbar spine 07/22/2011    [name] PT 05/18/2015, 11:59 AM  [facility] [address] [place], [address] Phone: [phone]   Fax:  [phone]  Name: [name] MRN: [id number] Date of Birth: February 28, 1960

## 2015-05-19 ENCOUNTER — Encounter: Payer: Self-pay | Admitting: Family Medicine

## 2015-05-19 ENCOUNTER — Other Ambulatory Visit: Payer: Self-pay | Admitting: Gastroenterology

## 2015-05-19 ENCOUNTER — Ambulatory Visit (INDEPENDENT_AMBULATORY_CARE_PROVIDER_SITE_OTHER): Payer: PPO | Admitting: Family Medicine

## 2015-05-19 ENCOUNTER — Encounter: Payer: Self-pay | Admitting: Internal Medicine

## 2015-05-19 VITALS — BP 122/96 | HR 111 | Temp 98.0°F | Wt 234.2 lb

## 2015-05-19 DIAGNOSIS — R110 Nausea: Secondary | ICD-10-CM

## 2015-05-19 DIAGNOSIS — R197 Diarrhea, unspecified: Secondary | ICD-10-CM

## 2015-05-19 DIAGNOSIS — Z4802 Encounter for removal of sutures: Secondary | ICD-10-CM | POA: Diagnosis not present

## 2015-05-19 DIAGNOSIS — R1013 Epigastric pain: Secondary | ICD-10-CM

## 2015-05-19 NOTE — Progress Notes (Signed)
Pre visit review using our clinic review tool, if applicable. No additional management support is needed unless otherwise documented below in the visit note. 

## 2015-05-19 NOTE — Progress Notes (Signed)
   Subjective:  Patient ID: [name], male    DOB: 1960/08/10  Age: 55 y.o. MRN: [id number]  CC: Suture removal  HPI:  55 year old male presents to the clinic today for suture removal.  Patient lacerated his left fifth digit on 4/9. He was seen in the ED on 4/9. Laceration was repaired via sutures. 4 sutures were placed. He was instructed to have them removed in 10 days. He presents today for suture removal. This time. No redness or drainage from the wound. No evidence of infection. No reported symptoms.  Social Hx   Social History   Social History  . Marital Status: Married    Spouse Name: N/A  . Number of Children: 3  . Years of Education: N/A   Social History Main Topics  . Smoking status: Never Smoker   . Smokeless tobacco: Never Used  . Alcohol Use: No  . Drug Use: No  . Sexual Activity: Not Asked   Other Topics Concern  . None   Social History Narrative   Review of Systems  Constitutional: Negative.   Skin: Positive for wound.   Objective:  BP 122/96 mmHg  Pulse 111  Temp(Src) 98 F (36.7 C) (Oral)  Wt 234 lb 4 oz (106.255 kg)  SpO2 97%  BP/Weight 05/19/2015 05/10/2015 [id number]  Systolic BP 123XX123 [id number]  Diastolic BP 96 90 80  Wt. (Lbs) 234.25 234 232  BMI 30.91 30.88 30.62   Physical Exam  Constitutional: He is oriented to person, place, and time. He appears well-developed. No distress.  Pulmonary/Chest: Effort normal.  Neurological: He is alert and oriented to person, place, and time.  Skin:  Left 5th digit - lateral well healing laceration noted. 4 sutures noted.  Vitals reviewed.  Lab Results  Component Value Date   WBC 8.5 04/30/2015   HGB 15.0 04/30/2015   HCT 43.6 04/30/2015   PLT 320.0 04/30/2015   GLUCOSE 95 04/30/2015   CHOL 157 04/30/2015   TRIG 170.0* 04/30/2015   HDL 30.60* 04/30/2015   LDLCALC 93 04/30/2015   ALT 38 05/14/2015   AST 23 05/14/2015   NA 138 04/30/2015   K 3.7 04/30/2015   CL 106 04/30/2015   CREATININE  0.98 04/30/2015   BUN 20 04/30/2015   CO2 23 04/30/2015   TSH 1.42 04/30/2015   PSA 0.72 06/12/2014   INR 0.9 10/27/2011   HGBA1C 5.3 09/14/2012    Assessment & Plan:   Problem List Items Addressed This Visit    Visit for suture removal - Primary    Sutures removed in standard fashion without difficulty.        Follow-up: PRN  [place]

## 2015-05-19 NOTE — Assessment & Plan Note (Signed)
Sutures removed in standard fashion without difficulty.

## 2015-05-20 ENCOUNTER — Encounter: Payer: Self-pay | Admitting: Physical Therapy

## 2015-05-20 ENCOUNTER — Ambulatory Visit: Payer: PPO | Admitting: Physical Therapy

## 2015-05-20 DIAGNOSIS — M6281 Muscle weakness (generalized): Secondary | ICD-10-CM

## 2015-05-20 DIAGNOSIS — M25511 Pain in right shoulder: Secondary | ICD-10-CM

## 2015-05-20 DIAGNOSIS — R29898 Other symptoms and signs involving the musculoskeletal system: Secondary | ICD-10-CM | POA: Diagnosis not present

## 2015-05-20 NOTE — Therapy (Signed)
[facility] [address], [place], [address] Phone: [phone]   Fax:  [phone]  Physical Therapy Treatment  Patient Details  Name: [name] MRN: [id number] Date of Birth: 1960/11/06 Referring Provider: Thomasene Lot. Johna Roles M.D.  Encounter Date: 05/20/2015      PT End of Session - 05/20/15 0955    Visit Number 10   Number of Visits 12   Date for PT Re-Evaluation 05/28/15   Authorization Type 10   Authorization Time Period 10 G code   PT Start Time 0906   PT Stop Time 1000   PT Time Calculation (min) 54 min   Activity Tolerance Patient tolerated treatment well;Patient limited by pain   Behavior During Therapy [facility] for tasks assessed/performed      Past Medical History  Diagnosis Date  . Hypercholesterolemia   . Pancreatitis   . Chronic back pain     s/p 5 back surgeries  . Mild cognitive impairment with memory loss   . Anoxic brain injury ([place])   . Depression   . Allergy   . H/O acute pancreatitis   . Coronary artery disease     Cardiac catheterization in 2007 at [facility] showed 30% stenosis in proximal LAD. No other disease. Ejection fraction was 65%    Past Surgical History  Procedure Laterality Date  . Cholecystectomy  2013  . Back surgery      multiple back surgeries (5)  . Vasectomy      With Reversal  . Spine surgery    . Vein surgery Right 2013    arm  . Cardiac catheterization      [facility]   . Trigger point injection      There were no vitals filed for this visit.      Subjective Assessment - 05/20/15 0947    Subjective Patient reports he is feeling less stiffness and difficulty with moving right UE AAROM with home program. He is still having pain throughout the day and has to use medication at night to control the pain. He reports the [place] has helped a great deal to control pain and loosen up his right shoulder.    Limitations Lifting;Other (comment)   Patient Stated Goals To  increase ability to move arm overhead and decrease shoulder pain.   Currently in Pain? Yes   Pain Score 8    Pain Location Shoulder   Pain Orientation Right   Pain Descriptors / Indicators Aching;Tightness;Sore;Spasm   Pain Type Surgical pain   Pain Onset More than a month ago  04/13/2015   Pain Frequency Constant        Objective: Palpation: Increased tenderness and spasms along right upper trap, posterior aspect right shoulder teres/infraspinatus muscles Outcome measure: QuickDash 78%  Measurement:  Right Shoulder AAROM/PROM (supine lying): Flexion: 115, ER: 0   Treatment: Therapeutic Exercise: Patient performed exercises with guidance, verbal and tactile cues and demonstration of therapist: Scapular retraction in supine/side lying with tactile cues 10 reps each PROM/AAROM: shoulder flexion to 110/115 degrees following repetition  2 x 5;  Shoulder external rotation  x 10 (To neutral: pain limited further ROM) Side lying: right upper trapezius release x 3 (10 seconds), scapular control elevation/depression with tactile cues x 10 reps, scapular retraction x 5 reps, scapular mobilization with rotation x 5 reps  Manual Therapy:  Soft tissue mobilization to upper traps, shoulder external rotations, upper arm, pectoralis major and scapular retraction with patient in seated position  with arm supported on pillow: goal; pain control, decreased spasms GHJ gentle grade 1 mobilization: lateral distraction to decrease pain (patient supine)   Modalities: Ultrasound:  1MHz 50% pulsed, 1.4w/cm2 applied x 10 min. Over right shoulder/upper trapezius with patient seated followed by there. Ex. Electrical stimulation:(following there. Ex/STM) High volt: clinical protocol for muscle spasms with patient positioned in sitting with arm supported with pillow and 2 electrodes placed on right shoulder over the upper trap and posterior aspect of the shoulder. Russian stim, 10/10, placed along scapular  retractors and lower traps. Ice placed over right shoulder for 20 minutes with estim. No adverse skin reactions noted.   Patient response to treatment: Decreased pain to ~6/10 after performing treatment today and decreased spasms with less tenderness by at least 50% following US/estim. Patient able to complete exercises with less difficulty with repetition and verbal and tactile cues, improved AAROM to 115 degrees forward elevation with less difficulty and spasms in supine lying following treatment        PT Education - 05/20/15 0955    Education provided Yes   Education Details HEP: continue with AAROM and scapular control exercises   Person(s) Educated Patient   Methods Explanation;Verbal cues;Demonstration   Comprehension Verbalized understanding;Returned demonstration;Verbal cues required             PT Long Term Goals - 05/20/15 1008    PT LONG TERM GOAL #1   Title Pt will improve QuickDASH score to <60% by 05/28/15 to demonstrate functional improvement right shoulder function and allow for improvement of shoulder movement.    Baseline QuickDASH: 92%  (current: 4/19 78%)   Status On-going   PT LONG TERM GOAL #2   Title Pt will be independent with HEP focused on regaining passive shoulder ROM and scapular mobility/ control to allow for progression of exercises per protocol by 05/28/2015   Baseline Dependent requiring constant cueing for progression and exercise performance.    Status On-going   PT LONG TERM GOAL #3   Title Patient will report decreased pain level to 4/10 max. in right shoulder to allow improved ROM/movement by 05/28/15 to be able to perform personal care with less difficulty   Baseline patient reports maximal pain level of 9/10 (7/10 current 05/20/15)   Status On-going               Plan - 05/20/15 0956    Clinical Impression Statement Patient is progressing with all goals with decreasing spasms and pain with improving AAROM with right shoulder  elevation. His current level of impairment based on QuickDash, pain scale and ROM/strength deficits is ~80% which has improved form 92%. He cotninues with significant pain level and weaknes s/p surgery with limitations per protocol and decreased functional use of right UE and will benefit from additional physical therapy intervention to achieve goals.    Rehab Potential Good   PT Frequency 3x / week   PT Duration 4 weeks   PT Treatment/Interventions Neuromuscular re-education;Cryotherapy;Electrical Stimulation;Iontophoresis 4mg /ml Dexamethasone;Moist Heat;Therapeutic exercise;Therapeutic activities;Ultrasound;Patient/family education;Manual techniques;Passive range of motion;Scar mobilization   PT Next Visit Plan AAROM/PROM, modalities for pain control, scapular control; add AAROM on table with ball   PT Home Exercise Plan AAROM supine, scapular control exercise supine and side lying      Patient will benefit from skilled therapeutic intervention in order to improve the following deficits and impairments:  Decreased range of motion, Decreased coordination, Decreased endurance, Impaired UE functional use, Increased muscle spasms, Decreased activity tolerance, Decreased knowledge of  precautions, Pain, Hypomobility, Decreased strength, Increased edema  Visit Diagnosis: Muscle weakness (generalized)  Pain in right shoulder       G-Codes - Jun 11, 2015 1043    Functional Assessment Tool Used QuickDash, ROM, strength, pain scale, clinical judgment   Functional Limitation Carrying, moving and handling objects   Carrying, Moving and Handling Objects Current Status [id number]) At least 80 percent but less than 100 percent impaired, limited or restricted   Carrying, Moving and Handling Objects Goal Status [id number]) At least 1 percent but less than 20 percent impaired, limited or restricted      Problem List Patient Active Problem List   Diagnosis Date Noted  . Visit for suture removal 05/19/2015  .  Abnormal liver function tests 05/03/2015  . Left knee pain 04/08/2015  . Complete rotator cuff rupture of left shoulder 03/26/2015  . Elevated blood pressure 03/01/2015  . Fungal infection of foot 10/18/2014  . Health care maintenance 06/16/2014  . History of pancreatitis 06/12/2014  . Sinus tachycardia ([place]) 06/27/2013  . DOE (dyspnea on exertion) 06/04/2013  . Sebaceous cyst 02/05/2013  . Hyponatremia 11/04/2012  . Short-term memory loss 05/06/2012  . Anemia 05/06/2012  . Neck fullness 03/27/2012  . Chronic back pain 12/01/2011  . Hypercholesteremia 12/01/2011  . Clinical depression 07/29/2011  . Failed back syndrome of lumbar spine 07/22/2011    Jomarie Longs PT Jun 11, 2015, 11:07 AM  [facility] [address] [place], [address] Phone: [phone]   Fax:  [phone]  Name: [name] MRN: [id number] Date of Birth: [birth year]

## 2015-05-22 ENCOUNTER — Ambulatory Visit: Payer: PPO | Admitting: Physical Therapy

## 2015-05-22 ENCOUNTER — Encounter: Payer: Self-pay | Admitting: Physical Therapy

## 2015-05-22 ENCOUNTER — Ambulatory Visit
Admission: RE | Admit: 2015-05-22 | Discharge: 2015-05-22 | Disposition: A | Discharge status: Home / Self Care | Payer: PPO | Source: Ambulatory Visit | Attending: Gastroenterology | Admitting: Gastroenterology

## 2015-05-22 DIAGNOSIS — R110 Nausea: Secondary | ICD-10-CM | POA: Diagnosis present

## 2015-05-22 DIAGNOSIS — R29898 Other symptoms and signs involving the musculoskeletal system: Secondary | ICD-10-CM | POA: Diagnosis not present

## 2015-05-22 DIAGNOSIS — K4090 Unilateral inguinal hernia, without obstruction or gangrene, not specified as recurrent: Secondary | ICD-10-CM | POA: Insufficient documentation

## 2015-05-22 DIAGNOSIS — R197 Diarrhea, unspecified: Secondary | ICD-10-CM

## 2015-05-22 DIAGNOSIS — M6281 Muscle weakness (generalized): Secondary | ICD-10-CM

## 2015-05-22 DIAGNOSIS — R1013 Epigastric pain: Secondary | ICD-10-CM

## 2015-05-22 DIAGNOSIS — M25511 Pain in right shoulder: Secondary | ICD-10-CM

## 2015-05-22 HISTORY — DX: Essential (primary) hypertension: I10

## 2015-05-22 MED ORDER — IOPAMIDOL (ISOVUE-300) INJECTION 61%
100.0000 mL | Freq: Once | INTRAVENOUS | Status: AC | PRN
  Administered 2015-05-22: 100 mL via INTRAVENOUS

## 2015-05-22 NOTE — Therapy (Signed)
[facility] [address] [place], [address] Phone: [phone]   Fax:  [phone]  Physical Therapy Treatment  Patient Details  Name: [name] MRN: [id number] Date of Birth: [birth year] Referring Provider: Thomasene Lot. Johna Roles M.D.  Encounter Date: 05/22/2015      PT End of Session - 05/22/15 1105    Visit Number 11   Number of Visits 12   Date for PT Re-Evaluation 05/28/15   Authorization Type 11   Authorization Time Period 20 G code   PT Start Time 1101   PT Stop Time 1200   PT Time Calculation (min) 59 min   Activity Tolerance Patient tolerated treatment well;Patient limited by pain   Behavior During Therapy [facility] for tasks assessed/performed      Past Medical History  Diagnosis Date  . Hypercholesterolemia   . Pancreatitis   . Chronic back pain     s/p 5 back surgeries  . Mild cognitive impairment with memory loss   . Anoxic brain injury ([place])   . Depression   . Allergy   . H/O acute pancreatitis   . Coronary artery disease     Cardiac catheterization in 2007 at [facility] showed 30% stenosis in proximal LAD. No other disease. Ejection fraction was 65%  . Hypertension     Past Surgical History  Procedure Laterality Date  . Cholecystectomy  2013  . Back surgery      multiple back surgeries (5)  . Vasectomy      With Reversal  . Spine surgery    . Vein surgery Right 2013    arm  . Cardiac catheterization      [facility]   . Trigger point injection      There were no vitals filed for this visit.      Subjective Assessment - 05/22/15 1101    Subjective Patient reports he did not sleep well last night and had a procedure this morning and is tired and painful.    Limitations Lifting;Other (comment)   Patient Stated Goals To increase ability to move arm overhead and decrease shoulder pain.   Currently in Pain? Yes   Pain Score 9    Pain Location Shoulder   Pain Orientation Right   Pain  Descriptors / Indicators Aching   Pain Onset More than a month ago  04/13/2015   Pain Frequency Constant      Objective: Palpation: Increased tenderness and spasms along right upper trap, posterior aspect right shoulder teres/infraspinatus muscles   Measurement:  Right Shoulder AAROM/PROM (supine lying): Flexion: 115, ER: 0   Treatment: Therapeutic Exercise: Patient performed exercises with guidance, verbal and tactile cues and demonstration of therapist: Standing AAROM with right hand on 45cm exercise ball for forward and back and side to side motions within pain free motion (short arc) 2 sets x 10 with tactile cues for scapular stabilization Scapular retraction side lying with tactile cues 10 reps each, Shoulder external rotation x 10 (To neutral: pain limited further ROM) Supine lying: with assistance of PT: PROM/AAROM: shoulder flexion to >115 degrees following repetition 2 x 5;  Side lying: right upper trapezius release x 3 (10 seconds), scapular control elevation/depression with tactile cues x 10 reps, scapular mobilization with rotation x 5 reps  Manual Therapy:  Soft tissue mobilization to upper traps, shoulder external rotations, upper arm, pectoralis major and scapular retraction with patient in seated position with arm supported on pillow: goal; pain control, decreased  spasms GHJ gentle grade 1 mobilization: lateral distraction to decrease pain (patient supine)   Modalities: Ultrasound:  1MHz 50% pulsed, 1.4w/cm2 applied x 10 min. Over right shoulder/upper trapezius with patient seated followed by there. Ex. Electrical stimulation:(following there. Ex/STM) High volt: clinical protocol for muscle spasms with patient positioned in sitting with arm supported with pillow and 2 electrodes placed on right shoulder over the upper trap and posterior aspect of the shoulder. Russian stim, 10/10, placed along scapular retractors and lower traps. Ice placed over right shoulder for  15 minutes with estim. No adverse skin reactions noted.   Patient response to treatment: Decreased pain to ~6/10 after performing treatment today and decreased spasms with less tenderness by at least 50% following US/estim. Patient able to complete exercises with less difficulty with repetition and verbal and tactile cues, improved AAROM to 115 degrees forward elevation with less difficulty and spasms in supine lying following treatment          PT Education - 05/22/15 1208    Education provided Yes   Education Details HEP: continue with AAROM with ball on table   Person(s) Educated Patient   Methods Explanation;Verbal cues;Demonstration   Comprehension Verbalized understanding;Returned demonstration;Verbal cues required             PT Long Term Goals - 05/20/15 1008    PT LONG TERM GOAL #1   Title Pt will improve QuickDASH score to <60% by 05/28/15 to demonstrate functional improvement right shoulder function and allow for improvement of shoulder movement.    Baseline QuickDASH: 92%  (current: 4/19 78%)   Status On-going   PT LONG TERM GOAL #2   Title Pt will be independent with HEP focused on regaining passive shoulder ROM and scapular mobility/ control to allow for progression of exercises per protocol by 05/28/2015   Baseline Dependent requiring constant cueing for progression and exercise performance.    Status On-going   PT LONG TERM GOAL #3   Title Patient will report decreased pain level to 4/10 max. in right shoulder to allow improved ROM/movement by 05/28/15 to be able to perform personal care with less difficulty   Baseline patient reports maximal pain level of 9/10 (7/10 current 05/20/15)   Status On-going               Plan - 05/22/15 1213    Clinical Impression Statement Patient is progressing well with decreasing pain and improving AAROM right shoulder and good scapular control. He continues with limitations of pain and weakness as he heals from surgery  and should continue to improve with additional skilled physical therpapy intervention.    Rehab Potential Good   PT Frequency 3x / week   PT Duration 4 weeks   PT Treatment/Interventions Neuromuscular re-education;Cryotherapy;Electrical Stimulation;Iontophoresis 4mg /ml Dexamethasone;Moist Heat;Therapeutic exercise;Therapeutic activities;Ultrasound;Patient/family education;Manual techniques;Passive range of motion;Scar mobilization   PT Next Visit Plan AAROM/PROM, modalities for pain control, scapular control   PT Home Exercise Plan AAROM supine, scapular control exercise supine and side lying      Patient will benefit from skilled therapeutic intervention in order to improve the following deficits and impairments:  Decreased range of motion, Decreased coordination, Decreased endurance, Impaired UE functional use, Increased muscle spasms, Decreased activity tolerance, Decreased knowledge of precautions, Pain, Hypomobility, Decreased strength, Increased edema  Visit Diagnosis: Muscle weakness (generalized)  Pain in right shoulder     Problem List Patient Active Problem List   Diagnosis Date Noted  . Visit for suture removal 05/19/2015  . Abnormal  liver function tests 05/03/2015  . Left knee pain 04/08/2015  . Complete rotator cuff rupture of left shoulder 03/26/2015  . Elevated blood pressure 03/01/2015  . Fungal infection of foot 10/18/2014  . Health care maintenance 06/16/2014  . History of pancreatitis 06/12/2014  . Sinus tachycardia ([place]) 06/27/2013  . DOE (dyspnea on exertion) 06/04/2013  . Sebaceous cyst 02/05/2013  . Hyponatremia 11/04/2012  . Short-term memory loss 05/06/2012  . Anemia 05/06/2012  . Neck fullness 03/27/2012  . Chronic back pain 12/01/2011  . Hypercholesteremia 12/01/2011  . Clinical depression 07/29/2011  . Failed back syndrome of lumbar spine 07/22/2011    Jomarie Longs PT 05/22/2015, 12:19 PM  [facility] [address], [place], [address] Phone: [phone]   Fax:  [phone]  Name: [name] MRN: [id number] Date of Birth: [birth year]

## 2015-05-25 ENCOUNTER — Ambulatory Visit: Payer: PPO | Admitting: Physical Therapy

## 2015-05-25 ENCOUNTER — Encounter: Payer: Self-pay | Admitting: Physical Therapy

## 2015-05-25 DIAGNOSIS — M25511 Pain in right shoulder: Secondary | ICD-10-CM

## 2015-05-25 DIAGNOSIS — M6281 Muscle weakness (generalized): Secondary | ICD-10-CM

## 2015-05-25 DIAGNOSIS — R29898 Other symptoms and signs involving the musculoskeletal system: Secondary | ICD-10-CM | POA: Diagnosis not present

## 2015-05-25 NOTE — Therapy (Signed)
[facility] [address] [place], [address] Phone: [phone]   Fax:  [phone]  Physical Therapy Treatment  Patient Details  Name: [name] MRN: [id number] Date of Birth: [birth year] Referring Provider: Thomasene Lot. Johna Roles M.D.  Encounter Date: 05/25/2015      PT End of Session - 05/25/15 0937    Visit Number 12   Number of Visits 12   Date for PT Re-Evaluation 05/28/15   Authorization Type 12   Authorization Time Period 20 G code   PT Start Time 0807   PT Stop Time 0850   PT Time Calculation (min) 43 min   Activity Tolerance Patient tolerated treatment well;Patient limited by pain   Behavior During Therapy [facility] for tasks assessed/performed      Past Medical History  Diagnosis Date  . Hypercholesterolemia   . Pancreatitis   . Chronic back pain     s/p 5 back surgeries  . Mild cognitive impairment with memory loss   . Anoxic brain injury ([place])   . Depression   . Allergy   . H/O acute pancreatitis   . Coronary artery disease     Cardiac catheterization in 2007 at [facility] showed 30% stenosis in proximal LAD. No other disease. Ejection fraction was 65%  . Hypertension     Past Surgical History  Procedure Laterality Date  . Cholecystectomy  2013  . Back surgery      multiple back surgeries (5)  . Vasectomy      With Reversal  . Spine surgery    . Vein surgery Right 2013    arm  . Cardiac catheterization      [facility]   . Trigger point injection      There were no vitals filed for this visit.      Subjective Assessment - 05/25/15 0815    Subjective Patient reports he is wearing sling less now and is not having to take medication for pain as often. He feels he is imrpoving with able to move right arm with less pain/difficulty with exercises.    Patient Stated Goals To increase ability to move arm overhead and decrease shoulder pain.   Currently in Pain? Yes   Pain Score 6    Pain  Location Shoulder   Pain Orientation Right   Pain Descriptors / Indicators Aching   Pain Type Surgical pain   Pain Onset More than a month ago  04/13/2015   Pain Frequency Constant         Objective: Palpation: Increased tenderness and spasms along right upper trap, posterior aspect right shoulder teres/infraspinatus muscles, hyper sensitive to touch anterior aspect of right shoulder   Measurement:  Right Shoulder AAROM/PROM (supine lying): Flexion: 135, ER: 0-10 with stiffness end range   Treatment: Therapeutic Exercise: Patient performed exercises with guidance, verbal and tactile cues and demonstration of therapist: Scapular retraction side lying with tactile cues 10 reps each, Shoulder external rotation x 10 (To neutral: pain limited further ROM) Supine lying: with assistance of PT: PROM/AAROM: shoulder flexion to >135 degrees following repetition 2 x 5;  Side lying: right upper trapezius release x 3 (10 seconds), scapular control elevation/depression with tactile cues x 10 reps, scapular mobilization with rotation x 5 reps Side lying: forward flexion AAROM through 90+ degrees x 10 reps, AAROM ER x 10 reps  Modalities: Ultrasound:  1MHz 50% pulsed, 1.4w/cm2 applied x 10 min. Over right shoulder/upper trapezius with patient seated followed  by there. Ex. Electrical stimulation:(following there. Ex) High volt: clinical protocol for muscle spasms with patient positioned in sitting with arm supported with pillow and 2 electrodes placed on right shoulder over the upper trap and posterior aspect of the shoulder. Russian stim, 10/10, placed along scapular retractors and lower traps. Ice placed over right shoulder for 15 minutes with estim. No adverse skin reactions noted.   Patient response to treatment: Decreased pain to ~4/10 after performing treatment today and decreased spasms with less tenderness by at least 50% following US/estim. Patient able to complete exercises with less  difficulty with repetition and verbal and tactile cues, improved AAROM to 135 degrees forward elevation with less difficulty and spasms in supine lying following treatment         PT Education - 05/25/15 0851    Education provided Yes   Education Details HEP for AAROM/PROM right shoulder   Person(s) Educated Patient   Methods Explanation   Comprehension Verbalized understanding             PT Long Term Goals - 05/20/15 1008    PT LONG TERM GOAL #1   Title Pt will improve QuickDASH score to <60% by 05/28/15 to demonstrate functional improvement right shoulder function and allow for improvement of shoulder movement.    Baseline QuickDASH: 92%  (current: 4/19 78%)   Status On-going   PT LONG TERM GOAL #2   Title Pt will be independent with HEP focused on regaining passive shoulder ROM and scapular mobility/ control to allow for progression of exercises per protocol by 05/28/2015   Baseline Dependent requiring constant cueing for progression and exercise performance.    Status On-going   PT LONG TERM GOAL #3   Title Patient will report decreased pain level to 4/10 max. in right shoulder to allow improved ROM/movement by 05/28/15 to be able to perform personal care with less difficulty   Baseline patient reports maximal pain level of 9/10 (7/10 current 05/20/15)   Status On-going               Plan - 05/25/15 0943    Clinical Impression Statement Patient is progressing well with current physical therapy intervention with improved AAROM right shoulder to 140 degrees forward elevation, 10 degrees ER with pain at end ranges. He has weaned out of sling and is wearing it only out of the home.    Rehab Potential Good   PT Frequency 3x / week   PT Duration 4 weeks   PT Treatment/Interventions Neuromuscular re-education;Cryotherapy;Electrical Stimulation;Iontophoresis 4mg /ml Dexamethasone;Moist Heat;Therapeutic exercise;Therapeutic activities;Ultrasound;Patient/family education;Manual  techniques;Passive range of motion;Scar mobilization   PT Next Visit Plan AAROM/PROM, modalities for pain control, scapular control   PT Home Exercise Plan AAROM supine, scapular control exercise supine and side lying and sitting/standing      Patient will benefit from skilled therapeutic intervention in order to improve the following deficits and impairments:  Decreased range of motion, Decreased coordination, Decreased endurance, Impaired UE functional use, Increased muscle spasms, Decreased activity tolerance, Decreased knowledge of precautions, Pain, Hypomobility, Decreased strength, Increased edema  Visit Diagnosis: Pain in right shoulder  Muscle weakness (generalized)     Problem List Patient Active Problem List   Diagnosis Date Noted  . Visit for suture removal 05/19/2015  . Abnormal liver function tests 05/03/2015  . Left knee pain 04/08/2015  . Complete rotator cuff rupture of left shoulder 03/26/2015  . Elevated blood pressure 03/01/2015  . Fungal infection of foot 10/18/2014  . Health care maintenance  06/16/2014  . History of pancreatitis 06/12/2014  . Sinus tachycardia ([place]) 06/27/2013  . DOE (dyspnea on exertion) 06/04/2013  . Sebaceous cyst 02/05/2013  . Hyponatremia 11/04/2012  . Short-term memory loss 05/06/2012  . Anemia 05/06/2012  . Neck fullness 03/27/2012  . Chronic back pain 12/01/2011  . Hypercholesteremia 12/01/2011  . Clinical depression 07/29/2011  . Failed back syndrome of lumbar spine 07/22/2011    Jomarie Longs PT 05/25/2015, 9:47 AM  [facility] [address], [place], [address] Phone: [phone]   Fax:  [phone]  Name: [name] MRN: [id number] Date of Birth: [birth year]

## 2015-05-27 ENCOUNTER — Ambulatory Visit: Payer: PPO | Admitting: Physical Therapy

## 2015-05-27 ENCOUNTER — Encounter: Payer: Self-pay | Admitting: Physical Therapy

## 2015-05-27 DIAGNOSIS — M6281 Muscle weakness (generalized): Secondary | ICD-10-CM

## 2015-05-27 DIAGNOSIS — R29898 Other symptoms and signs involving the musculoskeletal system: Secondary | ICD-10-CM | POA: Diagnosis not present

## 2015-05-27 DIAGNOSIS — M25511 Pain in right shoulder: Secondary | ICD-10-CM

## 2015-05-27 NOTE — Therapy (Signed)
[facility] [address], [place], [address] Phone: [phone]   Fax:  [phone]  Physical Therapy Treatment  Patient Details  Name: [name] MRN: [id number] Date of Birth: 02-26-1960 Referring Provider: Thomasene Lot. Johna Roles M.D.  Encounter Date: 05/27/2015      PT End of Session - 05/27/15 1444    Visit Number 13   Number of Visits 24   Date for PT Re-Evaluation 07/08/15   Authorization Type 13   Authorization Time Period 20 G code   PT Start Time 0807   PT Stop Time 0850   PT Time Calculation (min) 43 min   Activity Tolerance Patient tolerated treatment well;Patient limited by pain   Behavior During Therapy [facility] for tasks assessed/performed      Past Medical History  Diagnosis Date  . Hypercholesterolemia   . Pancreatitis   . Chronic back pain     s/p 5 back surgeries  . Mild cognitive impairment with memory loss   . Anoxic brain injury ([place])   . Depression   . Allergy   . H/O acute pancreatitis   . Coronary artery disease     Cardiac catheterization in 2007 at [facility] showed 30% stenosis in proximal LAD. No other disease. Ejection fraction was 65%  . Hypertension     Past Surgical History  Procedure Laterality Date  . Cholecystectomy  2013  . Back surgery      multiple back surgeries (5)  . Vasectomy      With Reversal  . Spine surgery    . Vein surgery Right 2013    arm  . Cardiac catheterization      [facility]   . Trigger point injection      There were no vitals filed for this visit.      Subjective Assessment - 05/27/15 0810    Subjective Patient reports he is having increased right shoulder pain today for no apparent reason. He is getting ready to go on vacation next week. He is to see MD tomorrow for follow up.    Limitations Lifting;House hold activities   Patient Stated Goals To increase ability to move arm overhead and decrease shoulder pain.   Currently in Pain? Yes   Pain Score 9    Pain Location Shoulder   Pain Orientation Right   Pain Descriptors / Indicators Aching;Sore   Pain Onset More than a month ago  04/13/2015   Pain Frequency Constant      Objective: Palpation: Increased tenderness and spasms along right upper trap, posterior aspect right shoulder teres/infraspinatus muscles, hyper sensitive to touch anterior aspect of right shoulder   Measurement:  Right Shoulder AAROM/PROM (supine lying): Flexion: 130, ER: 0-10 with stiffness end range   Treatment: Therapeutic Exercise: Patient performed exercises with guidance, verbal and tactile cues and demonstration of therapist: Scapular retraction side lying with tactile cues 10 reps Supine lying: with assistance of PT: PROM/AAROM: shoulder flexion to >130 degrees following repetition 2 x 5;ER off trunk x 10 to ~10 degrees of motion  Side lying: right upper trapezius release x 3 (10 seconds), scapular control elevation/depression with tactile cues x 10 reps, scapular mobilization with rotation x 5 reps Side lying: forward flexion AAROM through 90+ degrees x 10 reps  Modalities: Ultrasound:  1MHz 50% pulsed, 1.4w/cm2 applied x 10 min. Over right shoulder/upper trapezius with patient seated followed by there. Ex. Electrical stimulation:(following there. Ex) High volt: clinical protocol for muscle spasms  with patient positioned in sitting with arm supported with pillow and 2 electrodes placed on right shoulder over the upper trap and posterior aspect of the shoulder. Russian stim, 10/10, placed along scapular retractors and lower traps. Ice placed over right shoulder for 15 minutes with estim. No adverse skin reactions noted.   Patient response to treatment: Decreased pain to ~8/10 after performing treatment today and decreased spasms with less tenderness by at least 50% following US/estim. Patient able to complete exercises with less difficulty with repetition and verbal and tactile cues,  improved AAROM to 130 degrees forward elevation with less difficulty and spasms in supine lying following treatment            PT Education - 05/27/15 0853    Education provided Yes   Education Details HEP and per MD guidance following re assessment tomorrow, continue with weaning of sling and performing AAROM exercises for right shoulder   Person(s) Educated Patient   Methods Explanation   Comprehension Verbalized understanding             PT Long Term Goals - 05/27/15 1448    PT LONG TERM GOAL #1   Title Pt will improve QuickDASH score to <60% by 06/20/15 to demonstrate functional improvement right shoulder function and allow for improvement of shoulder movement.    Baseline QuickDASH: 92%  (current: 4/19 78%)   Status Revised   PT LONG TERM GOAL #2   Title Pt will be independent with HEP focused on regaining active shoulder ROM and scapular mobility/ control and strength of right UE by  07/08/2015 to allow continued self management    Baseline limited knowledge of appropriate progression of exercises: requires verbal and tactile cuing    Status Revised   PT LONG TERM GOAL #3   Title Patient will report decreased pain level to 4/10 max. in right shoulder to allow improved ROM/movement by 06/17/15 to be able to perform personal care with less difficulty   Baseline patient reports maximal pain level of 9/10 (7-9/10 current 05/27/15)   Status Revised   PT LONG TERM GOAL #4   Title Pt will improve QuickDASH score to <30% by 07/08/15 to demonstrate functional improvement right shoulder function and allow for improvement of shoulder movement.    Baseline 4/19: 78%    Status New               Plan - 05/27/15 0855    Clinical Impression Statement Patient is progressing well with current physical therapy intervention with improved AAROM right shoulder to 130+ degrees forward elevation and up to 10 degrees ER with continued pain as primary limiting factor. He is responding well  to [place] and estim. to control pain and spasms which allows him to progress through exercises. He continues with limitations of ROM, strength and endurance s/p right shoulder surgery x ~ 5 weeks and will benefit from continued physical therapy intervention to achieve full ROM, strength in order to return to prior level of function.    Rehab Potential Good   Clinical Impairments Affecting Rehab Potential (+): highly motivated, age, acute condition (-) previous orthopedic surgeries, chronic pain   PT Frequency 2x / week   PT Duration 6 weeks   PT Treatment/Interventions Neuromuscular re-education;Cryotherapy;Electrical Stimulation;Iontophoresis 4mg /ml Dexamethasone;Moist Heat;Therapeutic exercise;Therapeutic activities;Ultrasound;Patient/family education;Manual techniques;Passive range of motion;Scar mobilization   PT Next Visit Plan AAROM/PROM, modalities for pain control, scapular control per MD orders, protocol   PT Home Exercise Plan AAROM supine, scapular control exercise supine and  side lying and sitting/standing      Patient will benefit from skilled therapeutic intervention in order to improve the following deficits and impairments:  Decreased range of motion, Decreased coordination, Decreased endurance, Impaired UE functional use, Increased muscle spasms, Decreased activity tolerance, Decreased knowledge of precautions, Pain, Hypomobility, Decreased strength, Increased edema  Visit Diagnosis: Pain in right shoulder - Plan: PT plan of care cert/re-cert  Muscle weakness (generalized) - Plan: PT plan of care cert/re-cert     Problem List Patient Active Problem List   Diagnosis Date Noted  . Visit for suture removal 05/19/2015  . Abnormal liver function tests 05/03/2015  . Left knee pain 04/08/2015  . Complete rotator cuff rupture of left shoulder 03/26/2015  . Elevated blood pressure 03/01/2015  . Fungal infection of foot 10/18/2014  . Health care maintenance 06/16/2014  . History of  pancreatitis 06/12/2014  . Sinus tachycardia ([place]) 06/27/2013  . DOE (dyspnea on exertion) 06/04/2013  . Sebaceous cyst 02/05/2013  . Hyponatremia 11/04/2012  . Short-term memory loss 05/06/2012  . Anemia 05/06/2012  . Neck fullness 03/27/2012  . Chronic back pain 12/01/2011  . Hypercholesteremia 12/01/2011  . Clinical depression 07/29/2011  . Failed back syndrome of lumbar spine 07/22/2011    Jomarie Longs PT 05/27/2015, 3:02 PM  [facility] [address], [place], [address] Phone: [phone]   Fax:  [phone]  Name: [name] MRN: [id number] Date of Birth: 05-Jul-1960

## 2015-05-28 ENCOUNTER — Encounter: Payer: PPO | Admitting: Physical Therapy

## 2015-06-01 ENCOUNTER — Ambulatory Visit: Payer: PPO | Admitting: Physical Therapy

## 2015-06-02 ENCOUNTER — Ambulatory Visit: Payer: PPO | Admitting: Physical Therapy

## 2015-06-03 ENCOUNTER — Encounter: Payer: PPO | Admitting: Physical Therapy

## 2015-06-04 ENCOUNTER — Ambulatory Visit: Payer: PPO | Admitting: Physical Therapy

## 2015-06-05 ENCOUNTER — Encounter: Payer: PPO | Admitting: Physical Therapy

## 2015-06-08 ENCOUNTER — Encounter: Payer: PPO | Admitting: Physical Therapy

## 2015-06-09 ENCOUNTER — Ambulatory Visit: Payer: PPO | Admitting: Physical Therapy

## 2015-06-10 ENCOUNTER — Encounter: Payer: PPO | Admitting: Physical Therapy

## 2015-06-11 ENCOUNTER — Ambulatory Visit: Payer: PPO | Attending: Orthopedic Surgery | Admitting: Physical Therapy

## 2015-06-11 ENCOUNTER — Encounter: Payer: Self-pay | Admitting: Physical Therapy

## 2015-06-11 DIAGNOSIS — M25511 Pain in right shoulder: Secondary | ICD-10-CM

## 2015-06-11 DIAGNOSIS — M6281 Muscle weakness (generalized): Secondary | ICD-10-CM | POA: Insufficient documentation

## 2015-06-11 NOTE — Therapy (Signed)
[facility] [address], [place], [address] Phone: [phone]   Fax:  [phone]  Physical Therapy Treatment  Patient Details  Name: [name] MRN: [id number] Date of Birth: 30-Nov-1960 Referring Provider: Gillie Manners. Carney Living M.D.  Encounter Date: 06/11/2015      PT End of Session - 06/11/15 0845    Visit Number 14   Number of Visits 24   Date for PT Re-Evaluation 07/08/15   Authorization Type 14   Authorization Time Period 20 G code   PT Start Time 0802   PT Stop Time 0845   PT Time Calculation (min) 43 min   Activity Tolerance Patient tolerated treatment well   Behavior During Therapy WFL for tasks assessed/performed      Past Medical History  Diagnosis Date  . Hypercholesterolemia   . Pancreatitis   . Chronic back pain     s/p 5 back surgeries  . Mild cognitive impairment with memory loss   . Anoxic brain injury ([facility])   . Depression   . Allergy   . H/O acute pancreatitis   . Coronary artery disease     Cardiac catheterization in 2007 at [facility] showed 30% stenosis in proximal LAD. No other disease. Ejection fraction was 65%  . Hypertension     Past Surgical History  Procedure Laterality Date  . Cholecystectomy  2013  . Back surgery      multiple back surgeries (5)  . Vasectomy      With Reversal  . Spine surgery    . Vein surgery Right 2013    arm  . Cardiac catheterization      [facility]   . Trigger point injection      There were no vitals filed for this visit.      Subjective Assessment - 06/11/15 0804    Subjective Patient is returning from vacation and doing well with right UE. He has weaned out of sling. He reports he is now able to wash and blow dry hair with less difficulty and is not allowed to lift anything yet per MD. He is still stiff with rotation of shoulder (ER).    Limitations Lifting;House hold activities   Patient Stated Goals To increase ability to move arm  overhead and decrease shoulder pain.   Currently in Pain? Yes   Pain Score 5    Pain Location Shoulder   Pain Orientation Right   Pain Descriptors / Indicators Aching;Throbbing   Pain Type Surgical pain   Pain Onset More than a month ago  04/13/2015   Pain Frequency Constant      Objective: Palpation: Increased tenderness and spasms along right upper trap, posterior aspect right shoulder teres/infraspinatus and subscapularis muscles, hyper sensitive to touch anterior aspect of right shoulder   Measurement:  Right Shoulder AAROM/PROM (supine lying): Flexion: 130, AROM: right shoulder flexion in sitting 0-100; ER: 0- 50 (side lying)   Treatment: Therapeutic Exercise: Patient performed exercises with guidance, verbal and tactile cues and demonstration of therapist: Seated: isometric ER in neutral shoulder position: manual graded resistance, mild 5 reps 5 second holds Scapular retraction side lying and sitting with tactile cues 10 reps (3 sets total) Supine lying: with assistance of PT: PROM/AAROM: shoulder flexion to >130 degrees following repetition 2 x 5  Side lying: right upper trapezius release x 3 (10 seconds), scapular control elevation/depression with tactile cues  2 x 10 reps, scapular mobilization with rotation x 5 reps Side  lying: AROM ER 2 x 10 with guidance at end range (~50 degrees) Standing; ball on table and wall: patient performed flexion and rotations with verbal and tactile cues through available ROM with fatigue noted after ~ 15 reps each  Manual therapy:  STM performed to right shoulder upper trapezius, rhomboids and subscapularis throughout session in conjunction with exercises: goal decrease spasms, improve soft tissue elasticity  Modalities: Cryotherapy: Ice pack applied to right shoulder with patient seated in chair with pillow supporting right UE x 10 min. Following exercises: no adverse reactions noted   Patient response to treatment: Patient able to  complete exercises with less difficulty with repetition and verbal and tactile cues, improved AAROM to 130 degrees forward elevation with less difficulty and spasms in supine lying following treatment; improved ER from 10 to 50 degrees with active exercises as opposed to PROM or AAROM           PT Education - 06/11/15 0830    Education provided Yes   Education Details HEP progression and precautions re assessed with patient: no lifting, okay to perform active and active assisted ROM with ball on wall, table per handout   Person(s) Educated Patient   Methods Explanation;Demonstration;Verbal cues;Handout   Comprehension Verbalized understanding;Returned demonstration;Verbal cues required             PT Long Term Goals - 05/27/15 1448    PT LONG TERM GOAL #1   Title Pt will improve QuickDASH score to <60% by 06/20/15 to demonstrate functional improvement right shoulder function and allow for improvement of shoulder movement.    Baseline QuickDASH: 92%  (current: 4/19 78%)   Status Revised   PT LONG TERM GOAL #2   Title Pt will be independent with HEP focused on regaining active shoulder ROM and scapular mobility/ control and strength of right UE by  07/08/2015 to allow continued self management    Baseline limited knowledge of appropriate progression of exercises: requires verbal and tactile cuing    Status Revised   PT LONG TERM GOAL #3   Title Patient will report decreased pain level to 4/10 max. in right shoulder to allow improved ROM/movement by 06/17/15 to be able to perform personal care with less difficulty   Baseline patient reports maximal pain level of 9/10 (7-9/10 current 05/27/15)   Status Revised   PT LONG TERM GOAL #4   Title Pt will improve QuickDASH score to <30% by 07/08/15 to demonstrate functional improvement right shoulder function and allow for improvement of shoulder movement.    Baseline 4/19: 78%    Status New               Plan - 06/11/15 0847     Clinical Impression Statement Patient is progressing well with improved AROM with side lying ER and AAROM using ball. His pain level has decreased 50% since previous session and he is now able to perform more personal care tasks such as washing and blow drying hair with less difficulty. He should continue to progress toawards full functional use of right UE with additional physical therapy intervention.    Rehab Potential Good   PT Frequency 2x / week   PT Duration 6 weeks   PT Next Visit Plan AAROM/PROM, modalities for pain control, progressive strengthening: isometric/AROIM   PT Home Exercise Plan AAROM supine, scapular control exercise supine and side lying and sitting/standing      Patient will benefit from skilled therapeutic intervention in order to improve the following deficits  and impairments:  Decreased range of motion, Decreased coordination, Decreased endurance, Impaired UE functional use, Increased muscle spasms, Decreased activity tolerance, Decreased knowledge of precautions, Pain, Hypomobility, Decreased strength, Increased edema  Visit Diagnosis: Pain in right shoulder  Muscle weakness (generalized)     Problem List Patient Active Problem List   Diagnosis Date Noted  . Visit for suture removal 05/19/2015  . Abnormal liver function tests 05/03/2015  . Left knee pain 04/08/2015  . Complete rotator cuff rupture of left shoulder 03/26/2015  . Elevated blood pressure 03/01/2015  . Fungal infection of foot 10/18/2014  . Health care maintenance 06/16/2014  . History of pancreatitis 06/12/2014  . Sinus tachycardia ([place]) 06/27/2013  . DOE (dyspnea on exertion) 06/04/2013  . Sebaceous cyst 02/05/2013  . Hyponatremia 11/04/2012  . Short-term memory loss 05/06/2012  . Anemia 05/06/2012  . Neck fullness 03/27/2012  . Chronic back pain 12/01/2011  . Hypercholesteremia 12/01/2011  . Clinical depression 07/29/2011  . Failed back syndrome of lumbar spine 07/22/2011     Jomarie Longs PT 06/11/2015, 11:57 AM  [facility] [address] [place], [address] Phone: [phone]   Fax:  [phone]  Name: [name] MRN: [id number] Date of Birth: 1960-05-06

## 2015-06-12 ENCOUNTER — Encounter: Payer: PPO | Admitting: Physical Therapy

## 2015-06-15 ENCOUNTER — Encounter: Payer: PPO | Admitting: Physical Therapy

## 2015-06-16 ENCOUNTER — Encounter: Payer: Self-pay | Admitting: Physical Therapy

## 2015-06-16 ENCOUNTER — Ambulatory Visit: Payer: PPO | Admitting: Physical Therapy

## 2015-06-16 DIAGNOSIS — M25511 Pain in right shoulder: Secondary | ICD-10-CM | POA: Diagnosis not present

## 2015-06-16 DIAGNOSIS — M6281 Muscle weakness (generalized): Secondary | ICD-10-CM

## 2015-06-16 NOTE — Therapy (Signed)
[facility] [address], [place], [address] Phone: [phone]   Fax:  [phone]  Physical Therapy Treatment  Patient Details  Name: [name] MRN: [id number] Date of Birth: 03/16/1960 Referring Provider: Thomasene Lot. Johna Roles M.D.  Encounter Date: 06/16/2015      PT End of Session - 06/16/15 0801    Visit Number 15   Number of Visits 24   Date for PT Re-Evaluation 07/08/15   Authorization Type 14   Authorization Time Period 20 G code   PT Start Time 0755   PT Stop Time 0852   PT Time Calculation (min) 57 min   Equipment Utilized During Treatment Other (comment)   Activity Tolerance Patient tolerated treatment well   Behavior During Therapy [facility] for tasks assessed/performed      Past Medical History  Diagnosis Date  . Hypercholesterolemia   . Pancreatitis   . Chronic back pain     s/p 5 back surgeries  . Mild cognitive impairment with memory loss   . Anoxic brain injury ([place])   . Depression   . Allergy   . H/O acute pancreatitis   . Coronary artery disease     Cardiac catheterization in 2007 at [facility] showed 30% stenosis in proximal LAD. No other disease. Ejection fraction was 65%  . Hypertension     Past Surgical History  Procedure Laterality Date  . Cholecystectomy  2013  . Back surgery      multiple back surgeries (5)  . Vasectomy      With Reversal  . Spine surgery    . Vein surgery Right 2013    arm  . Cardiac catheterization      [facility]   . Trigger point injection      There were no vitals filed for this visit.      Subjective Assessment - 06/16/15 0757    Subjective Patient reports he is having soreness in right shoulder and reports slipping on ball and fell onto table yesterday without increased pain or limitations noted.     Limitations Lifting;House hold activities   Patient Stated Goals To increase ability to move arm overhead and decrease shoulder pain.   Currently in  Pain? Yes   Pain Score 5    Pain Location Shoulder   Pain Descriptors / Indicators Aching;Throbbing   Pain Type Surgical pain   Pain Onset More than a month ago  04/13/2015         Objective: Palpation: Increased tenderness and spasms along right upper trap, posterior aspect right shoulder teres/infraspinatus and subscapularis muscles, hyper sensitive to touch anterior aspect of right shoulder Observation: increased mild swelling anterior aspect right shoulder and above clavicle Right Shoulder AAROM/PROM (supine lying): Flexion: 130, AROM: right shoulder flexion in sitting 0-100; ER: 0- 50 (supine lying)    Treatment: Therapeutic Exercise: Patient performed exercises with guidance, verbal and tactile cues and demonstration of therapist: Scapular retraction side lying and sitting with tactile cues 10 reps (3 sets total) Supine lying: with assistance of PT: PROM/AAROM: shoulder flexion to >130 degrees following repetition 2 x 5; using ball held between hands: forward elevation overhead within tolerated ROM x 10 reps  Side lying: right upper trapezius release x 3 (10 seconds), scapular control elevation/depression with tactile cues 2 x 10 reps, scapular mobilization with rotation x 5 reps Side lying: AROM ER 2 x 10 with guidance at end range (~50 degrees) Standing; ball on table and wall:  patient performed flexion and rotations with verbal and tactile cues through available ROM with fatigue noted after ~ 15 reps each Prone lying: shoulder row and extension 2 x 5 reps with verbal cues   Modalities: Electrical stimulation:(following there. Ex) High volt: clinical protocol for muscle spasms with patient positioned in sitting with arm supported with pillow and 2 electrodes placed on right shoulder over the upper trap and posterior aspect of the shoulder. Russian stim, 10/10, placed along scapular retractors and lower traps. Ice placed over right shoulder for 15 minutes with estim. No  adverse skin reactions noted.   Patient response to treatment: Patient able to complete exercises with less difficulty with repetition with verbal and tactile cues, improved AAROM to 130 degrees forward elevation with less difficulty anddecreased spasms in supine lying following treatment        PT Education - 06/16/15 0845    Education provided Yes   Education Details HEP using ball for AAROM in supine, on wall, table; prone row and shoulder extension added   Person(s) Educated Patient   Methods Explanation;Demonstration;Verbal cues;Handout   Comprehension Verbalized understanding;Returned demonstration;Verbal cues required             PT Long Term Goals - 05/27/15 1448    PT LONG TERM GOAL #1   Title Pt will improve QuickDASH score to <60% by 06/20/15 to demonstrate functional improvement right shoulder function and allow for improvement of shoulder movement.    Baseline QuickDASH: 92%  (current: 4/19 78%)   Status Revised   PT LONG TERM GOAL #2   Title Pt will be independent with HEP focused on regaining active shoulder ROM and scapular mobility/ control and strength of right UE by  07/08/2015 to allow continued self management    Baseline limited knowledge of appropriate progression of exercises: requires verbal and tactile cuing    Status Revised   PT LONG TERM GOAL #3   Title Patient will report decreased pain level to 4/10 max. in right shoulder to allow improved ROM/movement by 06/17/15 to be able to perform personal care with less difficulty   Baseline patient reports maximal pain level of 9/10 (7-9/10 current 05/27/15)   Status Revised   PT LONG TERM GOAL #4   Title Pt will improve QuickDASH score to <30% by 07/08/15 to demonstrate functional improvement right shoulder function and allow for improvement of shoulder movement.    Baseline 4/19: 78%    Status New               Plan - 06/16/15 0900    Clinical Impression Statement Patient is progressing well with  improved AROM and strength with decreasing pain in general post op right shoulder surgery. He continues with weakness, decreased ROM and functional use of right UE as he heals from recent surgery and should continue to progress with additional physical therapy intervention.    Rehab Potential Good   PT Frequency 2x / week   PT Duration 6 weeks   PT Treatment/Interventions Neuromuscular re-education;Cryotherapy;Electrical Stimulation;Iontophoresis 4mg /ml Dexamethasone;Moist Heat;Therapeutic exercise;Therapeutic activities;Ultrasound;Patient/family education;Manual techniques;Passive range of motion;Scar mobilization   PT Next Visit Plan AAROM/PROM, modalities for pain control, progressive strengthening: isometric/AROIM   PT Home Exercise Plan AAROM supine, scapular control exercise supine and side lying and sitting/standing   Consulted and Agree with Plan of Care Family member/caregiver      Patient will benefit from skilled therapeutic intervention in order to improve the following deficits and impairments:  Decreased range of motion, Decreased coordination,  Decreased endurance, Impaired UE functional use, Increased muscle spasms, Decreased activity tolerance, Decreased knowledge of precautions, Pain, Hypomobility, Decreased strength, Increased edema  Visit Diagnosis: Pain in right shoulder  Muscle weakness (generalized)     Problem List Patient Active Problem List   Diagnosis Date Noted  . Visit for suture removal 05/19/2015  . Abnormal liver function tests 05/03/2015  . Left knee pain 04/08/2015  . Complete rotator cuff rupture of left shoulder 03/26/2015  . Elevated blood pressure 03/01/2015  . Fungal infection of foot 10/18/2014  . Health care maintenance 06/16/2014  . History of pancreatitis 06/12/2014  . Sinus tachycardia ([place]) 06/27/2013  . DOE (dyspnea on exertion) 06/04/2013  . Sebaceous cyst 02/05/2013  . Hyponatremia 11/04/2012  . Short-term memory loss 05/06/2012  .  Anemia 05/06/2012  . Neck fullness 03/27/2012  . Chronic back pain 12/01/2011  . Hypercholesteremia 12/01/2011  . Clinical depression 07/29/2011  . Failed back syndrome of lumbar spine 07/22/2011    Jomarie Longs PT 06/17/2015, 9:16 AM  [facility] [address] [place], [address] Phone: [phone]   Fax:  [phone]  Name: [name] MRN: [id number] Date of Birth: 03-25-1960

## 2015-06-17 ENCOUNTER — Encounter: Payer: Self-pay | Admitting: Internal Medicine

## 2015-06-17 ENCOUNTER — Encounter: Payer: PPO | Admitting: Physical Therapy

## 2015-06-18 ENCOUNTER — Encounter: Payer: Self-pay | Admitting: Physical Therapy

## 2015-06-18 ENCOUNTER — Ambulatory Visit: Payer: PPO | Admitting: Internal Medicine

## 2015-06-18 ENCOUNTER — Ambulatory Visit: Payer: PPO | Admitting: Physical Therapy

## 2015-06-18 DIAGNOSIS — M25511 Pain in right shoulder: Secondary | ICD-10-CM

## 2015-06-18 DIAGNOSIS — M6281 Muscle weakness (generalized): Secondary | ICD-10-CM

## 2015-06-18 NOTE — Therapy (Signed)
[facility] [address], [place], [address] Phone: [phone]   Fax:  [phone]  Physical Therapy Treatment  Patient Details  Name: [name] MRN: [id number] Date of Birth: Apr 09, 1960 Referring Provider: Thomasene Lot. Johna Roles M.D.  Encounter Date: 06/18/2015      PT End of Session - 06/18/15 0854    Visit Number 16   Number of Visits 24   Date for PT Re-Evaluation 07/08/15   Authorization Type 15   Authorization Time Period 20 G code   PT Start Time 0800   PT Stop Time 0853   PT Time Calculation (min) 53 min   Activity Tolerance Patient tolerated treatment well   Behavior During Therapy [facility] for tasks assessed/performed      Past Medical History  Diagnosis Date  . Hypercholesterolemia   . Pancreatitis   . Chronic back pain     s/p 5 back surgeries  . Mild cognitive impairment with memory loss   . Anoxic brain injury ([place])   . Depression   . Allergy   . H/O acute pancreatitis   . Coronary artery disease     Cardiac catheterization in 2007 at [facility] showed 30% stenosis in proximal LAD. No other disease. Ejection fraction was 65%  . Hypertension     Past Surgical History  Procedure Laterality Date  . Cholecystectomy  2013  . Back surgery      multiple back surgeries (5)  . Vasectomy      With Reversal  . Spine surgery    . Vein surgery Right 2013    arm  . Cardiac catheterization      [facility]   . Trigger point injection      There were no vitals filed for this visit.      Subjective Assessment - 06/18/15 0804    Subjective Patient reports he had increased pain in right shoulder yesterday.   Limitations Lifting;House hold activities   Patient Stated Goals To increase ability to move arm overhead and decrease shoulder pain.   Currently in Pain? Yes   Pain Score 8    Pain Location Shoulder   Pain Orientation Right   Pain Descriptors / Indicators Aching   Pain Onset More than a month  ago  04/13/15   Pain Frequency Constant      Objective: Palpation: Increased tenderness and spasms along right upper trap, posterior aspect right shoulder teres/infraspinatus and subscapularis muscles, hyper sensitive to touch anterior aspect of right shoulder Observation: increased mild swelling anterior aspect right shoulder and above clavicle Right Shoulder AAROM/PROM (supine lying): Flexion: 140, AROM: right shoulder flexion in sitting 0-100; ER: 0- 50 (supine lying)    Treatment: Therapeutic Exercise: Patient performed exercises with guidance, verbal and tactile cues and demonstration of therapist: Scapular retraction side lying with tactile cues 10 reps x 2 sets Supine lying: with assistance of PT: PROM/AAROM: shoulder flexion to >130 degrees following repetition 2 x 5; using ball held between hands: forward elevation overhead within tolerated ROM x 10 reps  Side lying: right upper trapezius release x 3 (10 seconds), scapular control elevation/depression with tactile cues 2 x 10 reps, scapular mobilization with rotation x 5 reps Side lying and supine lying: AROM ER 2 x 10 with guidance at end range (~50 degrees) Standing; ball on table: patient performed flexion and rotations with verbal and tactile cues through available ROM with fatigue noted after ~ 15 reps each Prone lying: shoulder row  and extension 2 x 5 reps with verbal cues Sitting with towel roll under right arm: Active ER both UE's x 10 reps with effort  Modalities: Electrical stimulation:(following there. Ex) High volt: clinical protocol for muscle spasms with patient positioned in sitting with arm supported with pillow and 2 electrodes placed on right shoulder over the upper trap and posterior aspect of the shoulder. Russian stim, 10/10, placed along scapular retractors and lower traps. Ice placed over right shoulder for 15 minutes with estim. No adverse skin reactions noted.   Patient response to treatment: Patient  able to complete exercises with less difficulty with repetition with verbal and tactile cues, improved AAROM to 140 degrees forward elevation with less difficulty and decreased spasms in supine lying following treatment; improved motor control noted with all exercises with tactile and verbal cues, decreased pain to 4/10 with estim following treatment         PT Education - 06/18/15 0842    Education provided Yes   Education Details HEP continue with scapular control exercises and strengthening as able.    Person(s) Educated Patient   Methods Explanation;Demonstration;Verbal cues   Comprehension Verbalized understanding             PT Long Term Goals - 05/27/15 1448    PT LONG TERM GOAL #1   Title Pt will improve QuickDASH score to <60% by 06/20/15 to demonstrate functional improvement right shoulder function and allow for improvement of shoulder movement.    Baseline QuickDASH: 92%  (current: 4/19 78%)   Status Revised   PT LONG TERM GOAL #2   Title Pt will be independent with HEP focused on regaining active shoulder ROM and scapular mobility/ control and strength of right UE by  07/08/2015 to allow continued self management    Baseline limited knowledge of appropriate progression of exercises: requires verbal and tactile cuing    Status Revised   PT LONG TERM GOAL #3   Title Patient will report decreased pain level to 4/10 max. in right shoulder to allow improved ROM/movement by 06/17/15 to be able to perform personal care with less difficulty   Baseline patient reports maximal pain level of 9/10 (7-9/10 current 05/27/15)   Status Revised   PT LONG TERM GOAL #4   Title Pt will improve QuickDASH score to <30% by 07/08/15 to demonstrate functional improvement right shoulder function and allow for improvement of shoulder movement.    Baseline 4/19: 78%    Status New               Plan - 06/18/15 0957    Clinical Impression Statement Patient is progressing well with improved  ROM to 140 degrees forward elevation with less difficulty and pain. He continues with weakness, decreased ROM and functional use of right UE and will continue to require physical therapy intervention to progress towards full function without difficulty.    Rehab Potential Good   PT Frequency 2x / week   PT Duration 6 weeks   PT Treatment/Interventions Neuromuscular re-education;Cryotherapy;Electrical Stimulation;Iontophoresis 4mg /ml Dexamethasone;Moist Heat;Therapeutic exercise;Therapeutic activities;Ultrasound;Patient/family education;Manual techniques;Passive range of motion;Scar mobilization   PT Next Visit Plan AAROM/PROM, modalities for pain control, progressive strengthening: isometric/AROIM   PT Home Exercise Plan AAROM supine, scapular control exercise supine and side lying and sitting/standing      Patient will benefit from skilled therapeutic intervention in order to improve the following deficits and impairments:  Decreased range of motion, Decreased coordination, Decreased endurance, Impaired UE functional use, Increased muscle spasms, Decreased  activity tolerance, Decreased knowledge of precautions, Pain, Hypomobility, Decreased strength, Increased edema  Visit Diagnosis: Muscle weakness (generalized)  Pain in right shoulder     Problem List Patient Active Problem List   Diagnosis Date Noted  . Visit for suture removal 05/19/2015  . Abnormal liver function tests 05/03/2015  . Left knee pain 04/08/2015  . Complete rotator cuff rupture of left shoulder 03/26/2015  . Elevated blood pressure 03/01/2015  . Fungal infection of foot 10/18/2014  . Health care maintenance 06/16/2014  . History of pancreatitis 06/12/2014  . Sinus tachycardia ([place]) 06/27/2013  . DOE (dyspnea on exertion) 06/04/2013  . Sebaceous cyst 02/05/2013  . Hyponatremia 11/04/2012  . Short-term memory loss 05/06/2012  . Anemia 05/06/2012  . Neck fullness 03/27/2012  . Chronic back pain 12/01/2011  .  Hypercholesteremia 12/01/2011  . Clinical depression 07/29/2011  . Failed back syndrome of lumbar spine 07/22/2011    Jomarie Longs PT 06/18/2015, 7:20 PM  [facility] [address], [place], [address] Phone: [phone]   Fax:  [phone]  Name: [name] MRN: [id number] Date of Birth: 07-30-1960

## 2015-06-19 ENCOUNTER — Encounter: Payer: PPO | Admitting: Physical Therapy

## 2015-06-22 ENCOUNTER — Encounter: Payer: PPO | Admitting: Physical Therapy

## 2015-06-23 ENCOUNTER — Ambulatory Visit: Payer: PPO | Admitting: Physical Therapy

## 2015-06-23 ENCOUNTER — Encounter: Payer: Self-pay | Admitting: Physical Therapy

## 2015-06-23 DIAGNOSIS — M6281 Muscle weakness (generalized): Secondary | ICD-10-CM

## 2015-06-23 DIAGNOSIS — M25511 Pain in right shoulder: Secondary | ICD-10-CM

## 2015-06-23 NOTE — Therapy (Signed)
[facility] [address] [place], [address] Phone: [phone]   Fax:  [phone]  Physical Therapy Treatment  Patient Details  Name: [name] MRN: [id number] Date of Birth: 05-31-1960 Referring Provider: Thomasene Lot. Johna Roles M.D.  Encounter Date: 06/23/2015      PT End of Session - 06/23/15 0922    Visit Number 17   Number of Visits 24   Date for PT Re-Evaluation 07/08/15   Authorization Type 17   Authorization Time Period 20 G code   PT Start Time 0802   PT Stop Time 0843   PT Time Calculation (min) 41 min   Activity Tolerance Patient tolerated treatment well   Behavior During Therapy [facility] for tasks assessed/performed      Past Medical History  Diagnosis Date  . Hypercholesterolemia   . Pancreatitis   . Chronic back pain     s/p 5 back surgeries  . Mild cognitive impairment with memory loss   . Anoxic brain injury ([place])   . Depression   . Allergy   . H/O acute pancreatitis   . Coronary artery disease     Cardiac catheterization in 2007 at [facility] showed 30% stenosis in proximal LAD. No other disease. Ejection fraction was 65%  . Hypertension     Past Surgical History  Procedure Laterality Date  . Cholecystectomy  2013  . Back surgery      multiple back surgeries (5)  . Vasectomy      With Reversal  . Spine surgery    . Vein surgery Right 2013    arm  . Cardiac catheterization      [facility]   . Trigger point injection      There were no vitals filed for this visit.      Subjective Assessment - 06/23/15 0810    Subjective Patient reports he is able to wash hair and dress with less difficulty. He has not had any swelling in shoulder/UE since previous session. He feels he is healing/progressing well with current treatment.    Limitations Lifting;House hold activities   Patient Stated Goals To increase ability to move arm overhead and decrease shoulder pain.   Currently in Pain? Yes    Pain Score 5    Pain Location Shoulder   Pain Orientation Right   Pain Descriptors / Indicators Aching   Pain Type Surgical pain   Pain Onset More than a month ago  04/13/2015      Objective: Palpation: mild spasms along right upper trap, posterior aspect right shoulder teres/infraspinatus and subscapularis muscles Right Shoulder AAROM/PROM (supine lying): Flexion: 140, AROM: right shoulder flexion in sitting 0-100; ER: 0- 50 (supine lying)    Treatment: Therapeutic Exercise: Patient performed exercises with guidance, verbal and tactile cues and demonstration of therapist: Scapular retraction side lying with tactile cues 10 reps x 2 sets Supine lying: with assistance of PT: PROM/AAROM: shoulder flexion to >130 degrees following repetition 2 x 5 Side lying: right upper trapezius release x 3 (10 seconds), scapular control elevation/depression with tactile cues 2 x 10 reps, scapular mobilization with rotation x 5 reps Side lying and supine lying: AROM ER 2 x 10 with guidance at end range (~50 degrees) Standing; ball on table: patient performed flexion and rotations with verbal and tactile cues through available ROM with fatigue noted after ~ 15 reps each Standing at counter: shoulder row and extension 2 x 5 reps with verbal cues  Modalities: Electrical stimulation:(following there. Ex) High volt: clinical protocol for muscle spasms with patient positioned in sitting with arm supported with pillow and 2 electrodes placed on right shoulder over the upper trap and posterior aspect of the shoulder. Russian stim, 10/10, placed along scapular retractors and lower traps. Ice placed over right shoulder for 15 minutes with estim. No adverse skin reactions noted.   Patient response to treatment: Patient able to complete exercises with less difficulty with repetition with verbal and tactile cues, improved AAROM to 140 degrees forward elevation with less difficulty and decreased spasms in supine  lying following treatment; improved motor control noted with all exercises with tactile and verbal cues, decreased pain to 2/10 with estim following treatment         PT Education - 06/23/15 0830    Education provided Yes   Education Details HEP: continue with AROM, scapular control and using right UE for daily personal care   Person(s) Educated Patient   Methods Explanation;Demonstration;Verbal cues   Comprehension Verbalized understanding;Returned demonstration;Verbal cues required             PT Long Term Goals - 05/27/15 1448    PT LONG TERM GOAL #1   Title Pt will improve QuickDASH score to <60% by 06/20/15 to demonstrate functional improvement right shoulder function and allow for improvement of shoulder movement.    Baseline QuickDASH: 92%  (current: 4/19 78%)   Status Revised   PT LONG TERM GOAL #2   Title Pt will be independent with HEP focused on regaining active shoulder ROM and scapular mobility/ control and strength of right UE by  07/08/2015 to allow continued self management    Baseline limited knowledge of appropriate progression of exercises: requires verbal and tactile cuing    Status Revised   PT LONG TERM GOAL #3   Title Patient will report decreased pain level to 4/10 max. in right shoulder to allow improved ROM/movement by 06/17/15 to be able to perform personal care with less difficulty   Baseline patient reports maximal pain level of 9/10 (7-9/10 current 05/27/15)   Status Revised   PT LONG TERM GOAL #4   Title Pt will improve QuickDASH score to <30% by 07/08/15 to demonstrate functional improvement right shoulder function and allow for improvement of shoulder movement.    Baseline 4/19: 78%    Status New               Plan - 06/23/15 0845    Clinical Impression Statement Patient is progressing well with improving functional use right UE and improveng strength right UE. He continues with weakness, decreased AROM and function and requires guidance  for progressive exercise in order to achieve full functional use right UE.    Rehab Potential Good   PT Frequency 2x / week   PT Duration 6 weeks   PT Treatment/Interventions Neuromuscular re-education;Cryotherapy;Electrical Stimulation;Iontophoresis 4mg /ml Dexamethasone;Moist Heat;Therapeutic exercise;Therapeutic activities;Ultrasound;Patient/family education;Manual techniques;Passive range of motion;Scar mobilization   PT Next Visit Plan AAROM/PROM, modalities for pain control, progressive strengthening: isometric/AROIM   PT Home Exercise Plan AAROM supine, scapular control exercise supine and side lying and sitting/standing      Patient will benefit from skilled therapeutic intervention in order to improve the following deficits and impairments:  Decreased range of motion, Decreased coordination, Decreased endurance, Impaired UE functional use, Increased muscle spasms, Decreased activity tolerance, Decreased knowledge of precautions, Pain, Hypomobility, Decreased strength, Increased edema  Visit Diagnosis: Muscle weakness (generalized)  Pain in right shoulder  Problem List Patient Active Problem List   Diagnosis Date Noted  . Visit for suture removal 05/19/2015  . Abnormal liver function tests 05/03/2015  . Left knee pain 04/08/2015  . Complete rotator cuff rupture of left shoulder 03/26/2015  . Elevated blood pressure 03/01/2015  . Fungal infection of foot 10/18/2014  . Health care maintenance 06/16/2014  . History of pancreatitis 06/12/2014  . Sinus tachycardia ([place]) 06/27/2013  . DOE (dyspnea on exertion) 06/04/2013  . Sebaceous cyst 02/05/2013  . Hyponatremia 11/04/2012  . Short-term memory loss 05/06/2012  . Anemia 05/06/2012  . Neck fullness 03/27/2012  . Chronic back pain 12/01/2011  . Hypercholesteremia 12/01/2011  . Clinical depression 07/29/2011  . Failed back syndrome of lumbar spine 07/22/2011    Jomarie Longs PT 06/23/2015, 2:42 PM  [facility] [address], [place], [address] Phone: [phone]   Fax:  [phone]  Name: [name] MRN: [id number] Date of Birth: 15-Sep-1960

## 2015-06-24 ENCOUNTER — Encounter: Payer: PPO | Admitting: Physical Therapy

## 2015-06-25 ENCOUNTER — Ambulatory Visit: Payer: PPO | Admitting: Physical Therapy

## 2015-06-26 ENCOUNTER — Encounter: Payer: PPO | Admitting: Physical Therapy

## 2015-06-26 ENCOUNTER — Encounter: Payer: Self-pay | Admitting: *Deleted

## 2015-06-30 ENCOUNTER — Encounter: Payer: Self-pay | Admitting: Anesthesiology

## 2015-06-30 ENCOUNTER — Ambulatory Visit
Admission: RE | Admit: 2015-06-30 | Discharge: 2015-06-30 | Disposition: A | Discharge status: Home Health | Payer: PPO | Source: Ambulatory Visit | Attending: Gastroenterology | Admitting: Gastroenterology

## 2015-06-30 ENCOUNTER — Ambulatory Visit: Payer: PPO | Admitting: Physical Therapy

## 2015-06-30 ENCOUNTER — Encounter
Admission: RE | Disposition: A | Discharge status: Home Health | Payer: Self-pay | Source: Ambulatory Visit | Attending: Gastroenterology

## 2015-06-30 ENCOUNTER — Ambulatory Visit: Payer: PPO | Admitting: Anesthesiology

## 2015-06-30 ENCOUNTER — Encounter: Payer: PPO | Admitting: Physical Therapy

## 2015-06-30 DIAGNOSIS — Z79899 Other long term (current) drug therapy: Secondary | ICD-10-CM | POA: Diagnosis not present

## 2015-06-30 DIAGNOSIS — I2510 Atherosclerotic heart disease of native coronary artery without angina pectoris: Secondary | ICD-10-CM | POA: Insufficient documentation

## 2015-06-30 DIAGNOSIS — F329 Major depressive disorder, single episode, unspecified: Secondary | ICD-10-CM | POA: Diagnosis not present

## 2015-06-30 DIAGNOSIS — I10 Essential (primary) hypertension: Secondary | ICD-10-CM | POA: Diagnosis not present

## 2015-06-30 DIAGNOSIS — E7800 Pure hypercholesterolemia, unspecified: Secondary | ICD-10-CM | POA: Diagnosis not present

## 2015-06-30 DIAGNOSIS — R197 Diarrhea, unspecified: Secondary | ICD-10-CM | POA: Diagnosis present

## 2015-06-30 DIAGNOSIS — G8929 Other chronic pain: Secondary | ICD-10-CM | POA: Insufficient documentation

## 2015-06-30 DIAGNOSIS — G3184 Mild cognitive impairment, so stated: Secondary | ICD-10-CM | POA: Insufficient documentation

## 2015-06-30 DIAGNOSIS — M549 Dorsalgia, unspecified: Secondary | ICD-10-CM | POA: Insufficient documentation

## 2015-06-30 DIAGNOSIS — Z882 Allergy status to sulfonamides status: Secondary | ICD-10-CM | POA: Diagnosis not present

## 2015-06-30 DIAGNOSIS — K5730 Diverticulosis of large intestine without perforation or abscess without bleeding: Secondary | ICD-10-CM | POA: Diagnosis not present

## 2015-06-30 DIAGNOSIS — K640 First degree hemorrhoids: Secondary | ICD-10-CM | POA: Diagnosis not present

## 2015-06-30 DIAGNOSIS — Z8719 Personal history of other diseases of the digestive system: Secondary | ICD-10-CM | POA: Insufficient documentation

## 2015-06-30 DIAGNOSIS — Z880 Allergy status to penicillin: Secondary | ICD-10-CM | POA: Diagnosis not present

## 2015-06-30 HISTORY — PX: COLONOSCOPY WITH PROPOFOL: SHX5780

## 2015-06-30 SURGERY — COLONOSCOPY WITH PROPOFOL
Anesthesia: General

## 2015-06-30 MED ORDER — PROPOFOL 500 MG/50ML IV EMUL
INTRAVENOUS | Status: DC | PRN
  Administered 2015-06-30: 160 ug/kg/min via INTRAVENOUS

## 2015-06-30 MED ORDER — FENTANYL CITRATE (PF) 100 MCG/2ML IJ SOLN
INTRAMUSCULAR | Status: DC | PRN
  Administered 2015-06-30: 50 ug via INTRAVENOUS

## 2015-06-30 MED ORDER — SODIUM CHLORIDE 0.9 % IV SOLN
INTRAVENOUS | Status: DC
  Administered 2015-06-30: 1000 mL via INTRAVENOUS

## 2015-06-30 MED ORDER — MIDAZOLAM HCL 2 MG/2ML IJ SOLN
INTRAMUSCULAR | Status: DC | PRN
  Administered 2015-06-30: 1 mg via INTRAVENOUS

## 2015-06-30 MED ORDER — SODIUM CHLORIDE 0.9 % IV SOLN: INTRAVENOUS | Status: DC

## 2015-06-30 MED ORDER — PROPOFOL 10 MG/ML IV BOLUS
INTRAVENOUS | Status: DC | PRN
  Administered 2015-06-30: 100 mg via INTRAVENOUS

## 2015-06-30 NOTE — Transfer of Care (Signed)
Immediate Anesthesia Transfer of Care Note  Patient: [name]  Procedure(s) Performed: Procedure(s): COLONOSCOPY WITH PROPOFOL (N/A)  Patient Location: PACU and Endoscopy Unit  Anesthesia Type:General  Level of Consciousness: patient cooperative and lethargic  Airway & Oxygen Therapy: Patient Spontanous Breathing and Patient connected to nasal cannula oxygen  Post-op Assessment: Report given to RN and Post -op Vital signs reviewed and stable  Post vital signs: Reviewed and stable  Last Vitals:  Filed Vitals:   06/30/15 1411 06/30/15 1617  BP: 150/89 106/67  Pulse: 101   Temp: 35.6 C 36.2 C  Resp: 20 20    Last Pain: There were no vitals filed for this visit.       Complications: No apparent anesthesia complications

## 2015-06-30 NOTE — Op Note (Signed)
[facility] Gastroenterology Patient Name: [name] Procedure Date: 06/30/2015 2:44 PM MRN: [id number] Account #: [id number] Date of Birth: 01-24-1961 Admit Type: Outpatient Age: 55 Room: [facility] ENDO ROOM 3 Gender: Male Note Status: Finalized Procedure:            Colonoscopy Indications:          Diarrhea Providers:            Lollie Sails, MD Referring MD:         Einar Pheasant, MD (Referring MD) Medicines:            Monitored Anesthesia Care Complications:        No immediate complications. Procedure:            Pre-Anesthesia Assessment:                       - ASA Grade Assessment: III - A patient with severe                        systemic disease.                       - ASA Grade Assessment: III - A patient with severe                        systemic disease.                       After obtaining informed consent, the colonoscope was                        passed under direct vision. Throughout the procedure,                        the patient's blood pressure, pulse, and oxygen                        saturations were monitored continuously. The                        Colonoscope was introduced through the anus and                        advanced to the the cecum, identified by appendiceal                        orifice and ileocecal valve. The colonoscopy was                        performed with moderate difficulty. Successful                        completion of the procedure was aided by lavage. The                        quality of the bowel preparation was good except the                        ascending colon was fair. Findings:      Multiple small-mouthed diverticula were found in the sigmoid colon and       distal descending colon.  Non-bleeding internal hemorrhoids were found during anoscopy. The       hemorrhoids were small and Grade I (internal hemorrhoids that do not       prolapse).      The digital rectal exam was  normal.      Biopsies for histology were taken with a cold forceps from the right       colon and left colon for evaluation of microscopic colitis. Impression:           - Diverticulosis in the sigmoid colon and in the distal                        descending colon.                       - Non-bleeding internal hemorrhoids. Recommendation:       - Discharge patient to home.                       - Await pathology results.                       - Telephone GI clinic for pathology results in 1 week.                       - Check pancreatic fecal elastase at appointment to be                        scheduled. Procedure Code(s):    --- Professional ---                       [phone], Colonoscopy, flexible; with biopsy, single or                        multiple Diagnosis Code(s):    --- Professional ---                       K64.0, First degree hemorrhoids                       R19.7, Diarrhea, unspecified                       K57.30, Diverticulosis of large intestine without                        perforation or abscess without bleeding CPT copyright 2016 [employer]. All rights reserved. The codes documented in this report are preliminary and upon coder review may  be revised to meet current compliance requirements. Lollie Sails, MD 06/30/2015 4:16:36 PM This report has been signed electronically. Number of Addenda: 0 Note Initiated On: 06/30/2015 2:44 PM Scope Withdrawal Time: 0 hours 11 minutes 1 second  Total Procedure Duration: 0 hours 22 minutes 52 seconds       [facility]

## 2015-06-30 NOTE — H&P (Signed)
Outpatient short stay form Pre-procedure 06/30/2015 3:32 PM Lollie Sails MD  Primary Physician: Dr. Einar Pheasant  Reason for visit:  Colonoscopy  History of present illness:  Patient is a 55 year old male presenting today for colonoscopy in regards problems with diarrhea. He does have a history of multiple acute episodes of pancreatitis however has been told he may not have any issues with his pancreas following evaluation at [facility]. Endoscopic ultrasound. He also has had a cholecystectomy about 2 years ago. About 3 months ago began to have problems with nausea postprandial epigastric pain and diarrhea. Patient has been on Creon which has been of some benefit.    Current facility-administered medications:  .  0.9 %  sodium chloride infusion, , Intravenous, Continuous, Lollie Sails, MD, Last Rate: 20 mL/hr at 06/30/15 1430, 1,000 mL at 06/30/15 1430 .  0.9 %  sodium chloride infusion, , Intravenous, Continuous, Lollie Sails, MD  Prescriptions prior to admission  Medication Sig Dispense Refill Last Dose  . lipase/protease/amylase (CREON) 12000 units CPEP capsule Take 24,000 Units by mouth 3 (three) times daily with meals.     . pantoprazole (PROTONIX) 40 MG tablet Take 40 mg by mouth daily.     Marland Kitchen amLODipine (NORVASC) 5 MG tablet Take by mouth.   Taking  . amphetamine-dextroamphetamine (ADDERALL) 20 MG tablet Take 20 mg by mouth 3 (three) times daily.   Taking  . cetirizine (ZYRTEC) 10 MG tablet Take 10 mg by mouth daily.   Taking  . diazepam (VALIUM) 5 MG tablet Take 5-10 mg by mouth every 8 (eight) hours as needed (pain). Reported on 04/08/2015   Taking  . esomeprazole (NEXIUM) 40 MG capsule Take 1 capsule (40 mg total) by mouth daily. 30 capsule 2 Taking  . fluticasone (FLONASE) 50 MCG/ACT nasal spray Place 2 sprays into both nostrils daily. 48 g 1 Taking  . HYDROcodone-acetaminophen (NORCO) 5-325 MG tablet    Taking  . naproxen (NAPROSYN) 500 MG tablet Take by  mouth.   Taking  . oxyCODONE (OXY IR/ROXICODONE) 5 MG immediate release tablet    Taking  . oxyCODONE-acetaminophen (ROXICET) 5-325 MG tablet Take 1 tablet by mouth every 6 (six) hours as needed for severe pain. 20 tablet 0 Taking  . tiZANidine (ZANAFLEX) 4 MG tablet Take 4 mg by mouth 3 (three) times daily.   Taking  . traMADol (ULTRAM) 50 MG tablet Take 1 tablet (50 mg total) by mouth every 6 (six) hours as needed for moderate pain. 12 tablet 0 Taking  . zolpidem (AMBIEN) 10 MG tablet Take 10 mg by mouth daily.  2 Taking     Allergies  Allergen Reactions  . Penicillins Anaphylaxis  . Sulfa Antibiotics      Past Medical History  Diagnosis Date  . Hypercholesterolemia   . Pancreatitis   . Chronic back pain     s/p 5 back surgeries  . Mild cognitive impairment with memory loss   . Anoxic brain injury ([place])   . Depression   . Allergy   . H/O acute pancreatitis   . Coronary artery disease     Cardiac catheterization in 2007 at [facility] showed 30% stenosis in proximal LAD. No other disease. Ejection fraction was 65%  . Hypertension     Review of systems:      Physical Exam    Heart and lungs: Regular rate and rhythm without rub or gallop, lungs are bilaterally clear.    HEENT: Normocephalic atraumatic eyes are anicteric  Other:     Pertinant exam for procedure: Soft nontender mild discomfort palpation in the right lower quadrant bowel sounds are positive and normoactive there is no rebound or masses noted.    Planned proceedures: Colonoscopy and indicated procedures. I have discussed the risks benefits and complications of procedures to include not limited to bleeding, infection, perforation and the risk of sedation and the patient wishes to proceed.    Lollie Sails, MD Gastroenterology 06/30/2015  3:32 PM

## 2015-06-30 NOTE — Anesthesia Preprocedure Evaluation (Signed)
Anesthesia Evaluation  Patient identified by MRN, date of birth, ID band Patient awake    Reviewed: Allergy & Precautions, H&P , NPO status , Patient's Chart, lab work & pertinent test results  History of Anesthesia Complications Negative for: history of anesthetic complications  Airway Mallampati: III  TM Distance: >3 FB Neck ROM: full    Dental  (+) Poor Dentition   Pulmonary shortness of breath,    Pulmonary exam normal breath sounds clear to auscultation       Cardiovascular Exercise Tolerance: Good hypertension, (-) angina+ CAD and + DOE  Normal cardiovascular exam Rhythm:regular Rate:Normal     Neuro/Psych PSYCHIATRIC DISORDERS Depression negative neurological ROS     GI/Hepatic negative GI ROS, Neg liver ROS,   Endo/Other  negative endocrine ROS  Renal/GU negative Renal ROS  negative genitourinary   Musculoskeletal   Abdominal   Peds  Hematology negative hematology ROS (+)   Anesthesia Other Findings Past Medical History:   Hypercholesterolemia                                         Pancreatitis                                                 Chronic back pain                                              Comment:s/p 5 back surgeries   Mild cognitive impairment with memory loss                   Anoxic brain injury ([place])                                    Depression                                                   Allergy                                                      H/O acute pancreatitis                                       Coronary artery disease                                        Comment:Cardiac catheterization in 2007 at [facility] showed               30% stenosis in proximal LAD. No other disease.  Ejection fraction was 65%   Hypertension                                                Past Surgical History:   CHOLECYSTECTOMY                                  2013        BACK SURGERY                                                    Comment:multiple back surgeries (5)   VASECTOMY                                                       Comment:With Reversal   SPINE SURGERY                                                 VEIN SURGERY                                    Right 2013           Comment:arm   CARDIAC CATHETERIZATION                                         Comment:[facility]    TRIGGER POINT INJECTION                                      BMI    Body Mass Index   30.61 kg/m 2    Requested that patient remove contact lenses to prevent corneal abrasion    Reproductive/Obstetrics negative OB ROS                             Anesthesia Physical Anesthesia Plan  ASA: III  Anesthesia Plan: General   Post-op Pain Management:    Induction:   Airway Management Planned:   Additional Equipment:   Intra-op Plan:   Post-operative Plan:   Informed Consent: I have reviewed the patients History and Physical, chart, labs and discussed the procedure including the risks, benefits and alternatives for the proposed anesthesia with the patient or authorized representative who has indicated his/her understanding and acceptance.   Dental Advisory Given  Plan Discussed with: Anesthesiologist, CRNA and Surgeon  Anesthesia Plan Comments:         Anesthesia Quick Evaluation

## 2015-07-01 ENCOUNTER — Encounter: Payer: Self-pay | Admitting: Gastroenterology

## 2015-07-02 ENCOUNTER — Encounter: Payer: PPO | Admitting: Physical Therapy

## 2015-07-02 NOTE — Anesthesia Postprocedure Evaluation (Signed)
Anesthesia Post Note  Patient: [name]  Procedure(s) Performed: Procedure(s) (LRB): COLONOSCOPY WITH PROPOFOL (N/A)  Patient location during evaluation: PACU Anesthesia Type: General Level of consciousness: awake Pain management: pain level controlled Vital Signs Assessment: post-procedure vital signs reviewed and stable Respiratory status: spontaneous breathing Cardiovascular status: blood pressure returned to baseline Anesthetic complications: no    Last Vitals:  Filed Vitals:   06/30/15 1637 06/30/15 1647  BP: 125/93 111/93  Pulse: 61 55  Temp:    Resp: 15 16    Last Pain: There were no vitals filed for this visit.               VAN STAVEREN,Orson Rho

## 2015-07-03 ENCOUNTER — Encounter: Payer: PPO | Admitting: Physical Therapy

## 2015-07-03 LAB — SURGICAL PATHOLOGY

## 2015-07-04 ENCOUNTER — Encounter: Payer: Self-pay | Admitting: Internal Medicine

## 2015-07-04 DIAGNOSIS — K5730 Diverticulosis of large intestine without perforation or abscess without bleeding: Secondary | ICD-10-CM | POA: Insufficient documentation

## 2015-07-06 ENCOUNTER — Encounter: Payer: PPO | Admitting: Physical Therapy

## 2015-07-06 ENCOUNTER — Other Ambulatory Visit: Payer: Self-pay | Admitting: Internal Medicine

## 2015-07-06 ENCOUNTER — Ambulatory Visit (INDEPENDENT_AMBULATORY_CARE_PROVIDER_SITE_OTHER): Payer: PPO | Admitting: Internal Medicine

## 2015-07-06 ENCOUNTER — Encounter: Payer: Self-pay | Admitting: Internal Medicine

## 2015-07-06 VITALS — BP 126/78 | HR 88 | Temp 98.4°F | Ht 73.0 in | Wt 227.4 lb

## 2015-07-06 DIAGNOSIS — D649 Anemia, unspecified: Secondary | ICD-10-CM | POA: Diagnosis not present

## 2015-07-06 DIAGNOSIS — M75122 Complete rotator cuff tear or rupture of left shoulder, not specified as traumatic: Secondary | ICD-10-CM

## 2015-07-06 DIAGNOSIS — I10 Essential (primary) hypertension: Secondary | ICD-10-CM

## 2015-07-06 DIAGNOSIS — E7800 Pure hypercholesterolemia, unspecified: Secondary | ICD-10-CM

## 2015-07-06 DIAGNOSIS — M549 Dorsalgia, unspecified: Secondary | ICD-10-CM

## 2015-07-06 DIAGNOSIS — G8929 Other chronic pain: Secondary | ICD-10-CM

## 2015-07-06 DIAGNOSIS — R945 Abnormal results of liver function studies: Secondary | ICD-10-CM

## 2015-07-06 DIAGNOSIS — M25562 Pain in left knee: Secondary | ICD-10-CM

## 2015-07-06 DIAGNOSIS — R7989 Other specified abnormal findings of blood chemistry: Secondary | ICD-10-CM

## 2015-07-06 NOTE — Progress Notes (Signed)
Patient ID: [name], male   DOB: 1961/01/10, 55 y.o.   MRN: [id number]   Subjective:    Patient ID: [name], male    DOB: [birth year], 55 y.o.   MRN: [id number]  HPI  Patient here for a scheduled follow up.  Is s/p shoulder surgery for right rotator cuff injury.  S/p therapy.  Has been swimming.  Shoulder is doing better.  S/p injections - knee.  Still with some pain.  Back is stable.  On amlodipine.  Blood pressure has been doing well.  Feels better.  No chest pain.  No sob.  No acid reflux.  No abdominal pain or cramping.  Bowels stable.  Trying to adjust his diet.  Trying to lose weight.     Past Medical History  Diagnosis Date  . Hypercholesterolemia   . Pancreatitis   . Chronic back pain     s/p 5 back surgeries  . Mild cognitive impairment with memory loss   . Anoxic brain injury ([place])   . Depression   . Allergy   . H/O acute pancreatitis   . Coronary artery disease     Cardiac catheterization in 2007 at [facility] showed 30% stenosis in proximal LAD. No other disease. Ejection fraction was 65%  . Hypertension    Past Surgical History  Procedure Laterality Date  . Cholecystectomy  2013  . Back surgery      multiple back surgeries (5)  . Vasectomy      With Reversal  . Spine surgery    . Vein surgery Right 2013    arm  . Cardiac catheterization      [facility]   . Trigger point injection    . Colonoscopy with propofol N/A 06/30/2015    Procedure: COLONOSCOPY WITH PROPOFOL;  Surgeon: Lollie Sails, MD;  Location: [facility] ENDOSCOPY;  Service: Endoscopy;  Laterality: N/A;   Family History  Problem Relation Age of Onset  . Heart disease Father   . Heart attack Father   . Hypertension    . Colon cancer    . Heart disease Mother   . Heart attack Mother   . Heart attack Brother     MI's x 2    Social History   Social History  . Marital Status: Married    Spouse Name: N/A  . Number of Children: 3  . Years of Education: N/A   Social History Main Topics  .  Smoking status: Never Smoker   . Smokeless tobacco: Never Used  . Alcohol Use: No  . Drug Use: No  . Sexual Activity: Not Asked   Other Topics Concern  . None   Social History Narrative    Outpatient Encounter Prescriptions as of 07/06/2015  Medication Sig  . amLODipine (NORVASC) 5 MG tablet Take by mouth.  Marland Kitchen amphetamine-dextroamphetamine (ADDERALL) 20 MG tablet Take 20 mg by mouth 3 (three) times daily.  . cetirizine (ZYRTEC) 10 MG tablet Take 10 mg by mouth daily.  . diazepam (VALIUM) 5 MG tablet Take 5-10 mg by mouth every 8 (eight) hours as needed (pain). Reported on 04/08/2015  . esomeprazole (NEXIUM) 40 MG capsule Take 1 capsule (40 mg total) by mouth daily.  . pantoprazole (PROTONIX) 40 MG tablet Take 40 mg by mouth daily.  Marland Kitchen tiZANidine (ZANAFLEX) 4 MG tablet Take 4 mg by mouth 3 (three) times daily.  Marland Kitchen zolpidem (AMBIEN) 10 MG tablet Take 10 mg by mouth daily.  . [DISCONTINUED] fluticasone (FLONASE) 50 MCG/ACT nasal  spray Place 2 sprays into both nostrils daily.  Marland Kitchen HYDROcodone-acetaminophen (NORCO) 5-325 MG tablet Reported on 07/06/2015  . lipase/protease/amylase (CREON) 12000 units CPEP capsule Take 24,000 Units by mouth 3 (three) times daily with meals. Reported on 07/06/2015  . naproxen (NAPROSYN) 500 MG tablet Take by mouth. Reported on 07/06/2015  . oxyCODONE (OXY IR/ROXICODONE) 5 MG immediate release tablet Reported on 07/06/2015  . oxyCODONE-acetaminophen (ROXICET) 5-325 MG tablet Take 1 tablet by mouth every 6 (six) hours as needed for severe pain. (Patient not taking: Reported on 07/06/2015)  . traMADol (ULTRAM) 50 MG tablet Take 1 tablet (50 mg total) by mouth every 6 (six) hours as needed for moderate pain. (Patient not taking: Reported on 07/06/2015)   No facility-administered encounter medications on file as of 07/06/2015.    Review of Systems  Constitutional: Negative for appetite change and unexpected weight change.  HENT: Negative for congestion and sinus pressure.     Respiratory: Negative for cough, chest tightness and shortness of breath.   Cardiovascular: Negative for chest pain, palpitations and leg swelling.  Gastrointestinal: Negative for nausea, vomiting, abdominal pain and diarrhea.  Genitourinary: Negative for dysuria and difficulty urinating.  Musculoskeletal: Positive for back pain (stable. ). Negative for joint swelling.  Skin: Negative for rash.  Neurological: Negative for dizziness, light-headedness and headaches.  Psychiatric/Behavioral: Negative for dysphoric mood and agitation.       Objective:    Physical Exam  Constitutional: He appears well-developed and well-nourished. No distress.  HENT:  Nose: Nose normal.  Mouth/Throat: Oropharynx is clear and moist.  Neck: Neck supple. No thyromegaly present.  Cardiovascular: Normal rate and regular rhythm.   Pulmonary/Chest: Effort normal and breath sounds normal. No respiratory distress.  Abdominal: Soft. Bowel sounds are normal. There is no tenderness.  Musculoskeletal: He exhibits no edema or tenderness.  Lymphadenopathy:    He has no cervical adenopathy.  Skin: No rash noted. No erythema.  Psychiatric: He has a normal mood and affect. His behavior is normal.    BP 126/78 mmHg  Pulse 88  Temp(Src) 98.4 F (36.9 C) (Oral)  Ht 6\' 1"  (1.854 m)  Wt 227 lb 6.4 oz (103.148 kg)  BMI 30.01 kg/m2  SpO2 93% Wt Readings from Last 3 Encounters:  07/06/15 227 lb 6.4 oz (103.148 kg)  06/30/15 232 lb (105.235 kg)  05/19/15 234 lb 4 oz (106.255 kg)     Lab Results  Component Value Date   WBC 8.5 04/30/2015   HGB 15.0 04/30/2015   HCT 43.6 04/30/2015   PLT 320.0 04/30/2015   GLUCOSE 95 04/30/2015   CHOL 157 04/30/2015   TRIG 170.0* 04/30/2015   HDL 30.60* 04/30/2015   LDLCALC 93 04/30/2015   ALT 38 05/14/2015   AST 23 05/14/2015   NA 138 04/30/2015   K 3.7 04/30/2015   CL 106 04/30/2015   CREATININE 0.98 04/30/2015   BUN 20 04/30/2015   CO2 23 04/30/2015   TSH 1.42  04/30/2015   PSA 0.72 06/12/2014   INR 0.9 10/27/2011   HGBA1C 5.3 09/14/2012       Assessment & Plan:   Problem List Items Addressed This Visit    Abnormal liver function tests    Recent liver panel wnl.  Saw GI.       Anemia    Follow cbc.       Chronic back pain - Primary    Followed at pain clinic.  Stable.       Complete rotator cuff rupture  of left shoulder    S/p surgery.  S/p therapy.  Swimming.  Doing better.       Hypercholesteremia    Low cholesterol diet and exercise.  Follow lipid panel.       Relevant Orders   Hepatic function panel   Lipid panel   Hypertension    On amlodipine.  Blood pressure doing well.  Follow.  Follow metabolic panel.       Relevant Orders   Basic metabolic panel   Left knee pain    Seeing ortho.  Continue f/u with ortho.            Einar Pheasant, MD

## 2015-07-06 NOTE — Progress Notes (Signed)
Pre visit review using our clinic review tool, if applicable. No additional management support is needed unless otherwise documented below in the visit note. 

## 2015-07-07 ENCOUNTER — Encounter: Payer: PPO | Admitting: Physical Therapy

## 2015-07-08 ENCOUNTER — Encounter: Payer: PPO | Admitting: Physical Therapy

## 2015-07-09 ENCOUNTER — Encounter: Payer: PPO | Admitting: Physical Therapy

## 2015-07-10 ENCOUNTER — Encounter: Payer: PPO | Admitting: Physical Therapy

## 2015-07-12 ENCOUNTER — Encounter: Payer: Self-pay | Admitting: Internal Medicine

## 2015-07-12 NOTE — Assessment & Plan Note (Signed)
Recent liver panel wnl.  Saw GI.

## 2015-07-12 NOTE — Assessment & Plan Note (Signed)
Seeing ortho.  Continue f/u with ortho.

## 2015-07-12 NOTE — Assessment & Plan Note (Signed)
On amlodipine.  Blood pressure doing well.  Follow.  Follow metabolic panel.

## 2015-07-12 NOTE — Assessment & Plan Note (Signed)
S/p surgery.  S/p therapy.  Swimming.  Doing better.

## 2015-07-12 NOTE — Assessment & Plan Note (Signed)
Follow cbc.  

## 2015-07-12 NOTE — Assessment & Plan Note (Signed)
Low cholesterol diet and exercise.  Follow lipid panel.   

## 2015-07-12 NOTE — Assessment & Plan Note (Signed)
Followed at pain clinic.  Stable.

## 2015-07-13 ENCOUNTER — Encounter: Payer: PPO | Admitting: Physical Therapy

## 2015-07-14 ENCOUNTER — Encounter: Payer: PPO | Admitting: Physical Therapy

## 2015-07-15 ENCOUNTER — Encounter: Payer: PPO | Admitting: Physical Therapy

## 2015-07-16 ENCOUNTER — Encounter: Payer: PPO | Admitting: Physical Therapy

## 2015-07-17 ENCOUNTER — Encounter: Payer: PPO | Admitting: Physical Therapy

## 2015-07-20 ENCOUNTER — Encounter: Payer: PPO | Admitting: Physical Therapy

## 2015-07-21 ENCOUNTER — Encounter: Payer: PPO | Admitting: Physical Therapy

## 2015-07-22 ENCOUNTER — Encounter: Payer: PPO | Admitting: Physical Therapy

## 2015-07-23 ENCOUNTER — Encounter: Payer: PPO | Admitting: Physical Therapy

## 2015-07-24 ENCOUNTER — Encounter: Payer: PPO | Admitting: Physical Therapy

## 2015-07-28 ENCOUNTER — Encounter: Payer: PPO | Admitting: Physical Therapy

## 2015-07-30 ENCOUNTER — Encounter: Payer: PPO | Admitting: Physical Therapy

## 2015-09-21 ENCOUNTER — Other Ambulatory Visit: Payer: Self-pay | Admitting: Family Medicine

## 2015-09-30 ENCOUNTER — Other Ambulatory Visit (INDEPENDENT_AMBULATORY_CARE_PROVIDER_SITE_OTHER): Payer: PPO

## 2015-09-30 DIAGNOSIS — E7800 Pure hypercholesterolemia, unspecified: Secondary | ICD-10-CM | POA: Diagnosis not present

## 2015-09-30 DIAGNOSIS — I10 Essential (primary) hypertension: Secondary | ICD-10-CM

## 2015-09-30 LAB — BASIC METABOLIC PANEL
BUN: 16 mg/dL (ref 6–23)
CALCIUM: 9.3 mg/dL (ref 8.4–10.5)
CO2: 26 meq/L (ref 19–32)
Chloride: 104 mEq/L (ref 96–112)
Creatinine, Ser: 1.11 mg/dL (ref 0.40–1.50)
GFR: 73.62 mL/min (ref >60.00)
Glucose, Bld: 93 mg/dL (ref 70–99)
Potassium: 4.1 mEq/L (ref 3.5–5.1)
SODIUM: 138 meq/L (ref 135–145)

## 2015-09-30 LAB — LIPID PANEL
CHOL/HDL RATIO: 4
Cholesterol: 181 mg/dL (ref 0–200)
HDL: 41.9 mg/dL (ref >39.00)
LDL Cholesterol: 103 mg/dL — ABNORMAL HIGH (ref 0–99)
NONHDL: 139.13
TRIGLYCERIDES: 179 mg/dL — AB (ref 0.0–149.0)
VLDL: 35.8 mg/dL (ref 0.0–40.0)

## 2015-09-30 LAB — HEPATIC FUNCTION PANEL
ALBUMIN: 4.5 g/dL (ref 3.5–5.2)
ALT: 38 U/L (ref 0–53)
AST: 29 U/L (ref 0–37)
Alkaline Phosphatase: 113 U/L (ref 39–117)
Bilirubin, Direct: 0 mg/dL (ref 0.0–0.3)
TOTAL PROTEIN: 8 g/dL (ref 6.0–8.3)
Total Bilirubin: 0.5 mg/dL (ref 0.2–1.2)

## 2015-10-01 ENCOUNTER — Encounter: Admission: RE | Payer: Self-pay | Source: Ambulatory Visit

## 2015-10-01 ENCOUNTER — Encounter: Payer: Self-pay | Admitting: Internal Medicine

## 2015-10-01 ENCOUNTER — Ambulatory Visit: Admission: RE | Admit: 2015-10-01 | Payer: PPO | Source: Ambulatory Visit | Admitting: Gastroenterology

## 2015-10-01 SURGERY — ESOPHAGOGASTRODUODENOSCOPY (EGD) WITH PROPOFOL
Anesthesia: General

## 2015-10-06 ENCOUNTER — Ambulatory Visit (INDEPENDENT_AMBULATORY_CARE_PROVIDER_SITE_OTHER): Payer: PPO | Admitting: Internal Medicine

## 2015-10-06 ENCOUNTER — Encounter: Payer: Self-pay | Admitting: Internal Medicine

## 2015-10-06 DIAGNOSIS — R7989 Other specified abnormal findings of blood chemistry: Secondary | ICD-10-CM

## 2015-10-06 DIAGNOSIS — E7800 Pure hypercholesterolemia, unspecified: Secondary | ICD-10-CM | POA: Diagnosis not present

## 2015-10-06 DIAGNOSIS — R000 Tachycardia, unspecified: Secondary | ICD-10-CM | POA: Diagnosis not present

## 2015-10-06 DIAGNOSIS — K5730 Diverticulosis of large intestine without perforation or abscess without bleeding: Secondary | ICD-10-CM

## 2015-10-06 DIAGNOSIS — I10 Essential (primary) hypertension: Secondary | ICD-10-CM | POA: Diagnosis not present

## 2015-10-06 DIAGNOSIS — Z23 Encounter for immunization: Secondary | ICD-10-CM

## 2015-10-06 DIAGNOSIS — M75122 Complete rotator cuff tear or rupture of left shoulder, not specified as traumatic: Secondary | ICD-10-CM

## 2015-10-06 DIAGNOSIS — M549 Dorsalgia, unspecified: Secondary | ICD-10-CM

## 2015-10-06 DIAGNOSIS — G8929 Other chronic pain: Secondary | ICD-10-CM

## 2015-10-06 DIAGNOSIS — R945 Abnormal results of liver function studies: Secondary | ICD-10-CM

## 2015-10-06 MED ORDER — TRIAMCINOLONE ACETONIDE 0.1 % EX CREA: 1.0000 "application " | TOPICAL_CREAM | Freq: Two times a day (BID) | CUTANEOUS | 0 refills | Status: DC

## 2015-10-06 NOTE — Patient Instructions (Signed)
Lotrimin cream - apply to affected area - left armpit

## 2015-10-06 NOTE — Progress Notes (Signed)
Patient ID: [name], male   DOB: 1960/04/01, 55 y.o.   MRN: [id number]   Subjective:    Patient ID: [name], male    DOB: 24-Nov-1960, 55 y.o.   MRN: [id number]  HPI  Patient here for a scheduled follow up.  States he has been doing relatively well.  Being followed by pain clinic for persistent chronic back pain.  Receiving lidocaine injections q 3-4 weeks.  This is controlling the back pain.  Overall feels is stable.  Seeing Dr Claudia Desanctis for his shoulder.  S/p surgery.  Had rotator cuff repair 04/2015.  See notes.  Taking naprosyn.  Seeing GI.  Taking creon.  I reviewed their note.  They had recommended protonix bid and carafate.  He reports his stomach is doing better and states he is not taking anything else for his stomach. No chest pain.  No sob.  No acid reflux.  No abdominal pain.  Bowels stable.  Takes miralax and this controls.  Handling stress.     Past Medical History:  Diagnosis Date  . Allergy   . Anoxic brain injury ([place])   . Chronic back pain    s/p 5 back surgeries  . Coronary artery disease    Cardiac catheterization in 2007 at [facility] showed 30% stenosis in proximal LAD. No other disease. Ejection fraction was 65%  . Depression   . H/O acute pancreatitis   . Hypercholesterolemia   . Hypertension   . Mild cognitive impairment with memory loss   . Pancreatitis    Past Surgical History:  Procedure Laterality Date  . BACK SURGERY     multiple back surgeries (5)  . CARDIAC CATHETERIZATION     [facility]   . CHOLECYSTECTOMY  2013  . COLONOSCOPY WITH PROPOFOL N/A 06/30/2015   Procedure: COLONOSCOPY WITH PROPOFOL;  Surgeon: Lollie Sails, MD;  Location: [facility] ENDOSCOPY;  Service: Endoscopy;  Laterality: N/A;  . SPINE SURGERY    . TRIGGER POINT INJECTION    . VASECTOMY     With Reversal  . VEIN SURGERY Right 2013   arm   Family History  Problem Relation Age of Onset  . Heart disease Father   . Heart attack Father   . Heart disease Mother   . Heart attack  Mother   . Heart attack Brother     MI's x 2   . Hypertension    . Colon cancer     Social History   Social History  . Marital status: Married    Spouse name: N/A  . Number of children: 3  . Years of education: N/A   Social History Main Topics  . Smoking status: Never Smoker  . Smokeless tobacco: Never Used  . Alcohol use No  . Drug use: No  . Sexual activity: Not Asked   Other Topics Concern  . None   Social History Narrative  . None    Outpatient Encounter Prescriptions as of 10/06/2015  Medication Sig  . amLODipine (NORVASC) 5 MG tablet Take by mouth.  Marland Kitchen amphetamine-dextroamphetamine (ADDERALL) 20 MG tablet Take 20 mg by mouth 3 (three) times daily.  . cetirizine (ZYRTEC) 10 MG tablet Take 10 mg by mouth daily.  . diazepam (VALIUM) 5 MG tablet Take 5-10 mg by mouth every 8 (eight) hours as needed (pain). Reported on 04/08/2015  . fluticasone (FLONASE) 50 MCG/ACT nasal spray PLACE 2 SPRAYS INTO BOTH NOSTRILS DAILY.  Marland Kitchen lipase/protease/amylase (CREON) 12000 units CPEP capsule Take 24,000 Units by  mouth 3 (three) times daily with meals. Reported on 07/06/2015  . naproxen (NAPROSYN) 500 MG tablet Take by mouth. Reported on 07/06/2015  . tiZANidine (ZANAFLEX) 4 MG tablet Take 4 mg by mouth 3 (three) times daily.  . [DISCONTINUED] esomeprazole (NEXIUM) 40 MG capsule Take 1 capsule (40 mg total) by mouth daily.  . [DISCONTINUED] pantoprazole (PROTONIX) 40 MG tablet Take 40 mg by mouth daily.  Marland Kitchen HYDROcodone-acetaminophen (NORCO) 5-325 MG tablet Reported on 07/06/2015  . oxyCODONE (OXY IR/ROXICODONE) 5 MG immediate release tablet Reported on 07/06/2015  . oxyCODONE-acetaminophen (ROXICET) 5-325 MG tablet Take 1 tablet by mouth every 6 (six) hours as needed for severe pain. (Patient not taking: Reported on 10/06/2015)  . traMADol (ULTRAM) 50 MG tablet Take 1 tablet (50 mg total) by mouth every 6 (six) hours as needed for moderate pain. (Patient not taking: Reported on 10/06/2015)  . triamcinolone  cream (KENALOG) 0.1 % Apply 1 application topically 2 (two) times daily. Apply to affected area bid (on neck)  . [DISCONTINUED] zolpidem (AMBIEN) 10 MG tablet Take 10 mg by mouth daily.   No facility-administered encounter medications on file as of 10/06/2015.     Review of Systems  Constitutional: Negative for appetite change and unexpected weight change.  HENT: Negative for congestion and sinus pressure.   Respiratory: Negative for cough, chest tightness and shortness of breath.   Cardiovascular: Negative for chest pain, palpitations and leg swelling.  Gastrointestinal: Negative for abdominal pain, diarrhea, nausea and vomiting.  Genitourinary: Negative for difficulty urinating and dysuria.  Musculoskeletal: Positive for back pain. Negative for joint swelling.  Skin: Negative for color change and rash.  Neurological: Negative for dizziness, light-headedness and headaches.  Psychiatric/Behavioral: Negative for agitation and dysphoric mood.       Objective:    Physical Exam  Constitutional: He appears well-developed and well-nourished. No distress.  HENT:  Nose: Nose normal.  Mouth/Throat: Oropharynx is clear and moist.  Neck: Neck supple. No thyromegaly present.  Cardiovascular: Normal rate and regular rhythm.   Pulmonary/Chest: Effort normal and breath sounds normal. No respiratory distress.  Abdominal: Soft. Bowel sounds are normal. There is no tenderness.  Musculoskeletal: He exhibits no edema or tenderness.  Lymphadenopathy:    He has no cervical adenopathy.  Skin: No rash noted. No erythema.  Psychiatric: He has a normal mood and affect. His behavior is normal.    BP 122/80   Pulse (!) 107   Temp 98.8 F (37.1 C) (Oral)   Resp 18   Ht 6\' 1"  (1.854 m)   Wt 228 lb 2 oz (103.5 kg)   SpO2 95%   BMI 30.10 kg/m  Wt Readings from Last 3 Encounters:  10/06/15 228 lb 2 oz (103.5 kg)  07/06/15 227 lb 6.4 oz (103.1 kg)  06/30/15 232 lb (105.2 kg)     Lab Results    Component Value Date   WBC 8.5 04/30/2015   HGB 15.0 04/30/2015   HCT 43.6 04/30/2015   PLT 320.0 04/30/2015   GLUCOSE 93 09/30/2015   CHOL 181 09/30/2015   TRIG 179.0 (H) 09/30/2015   HDL 41.90 09/30/2015   LDLCALC 103 (H) 09/30/2015   ALT 38 09/30/2015   AST 29 09/30/2015   NA 138 09/30/2015   K 4.1 09/30/2015   CL 104 09/30/2015   CREATININE 1.11 09/30/2015   BUN 16 09/30/2015   CO2 26 09/30/2015   TSH 1.42 04/30/2015   PSA 0.72 06/12/2014   INR 0.9 10/27/2011   HGBA1C  5.3 09/14/2012       Assessment & Plan:   Problem List Items Addressed This Visit    Abnormal liver function tests    Saw GI.  See note.  Follow liver panel.        Chronic back pain    Followed at pain clinic.  Stable.        Complete rotator cuff rupture of left shoulder    S/p surgery.  Continue f/u with ortho.       Diverticulosis of colon without hemorrhage    Colonoscopy 06/30/15 as outlined.  Recommended f/u colonoscopy in 10 years.        Hypercholesteremia    Low cholesterol diet and exercise.  Follow lipid panel.        Relevant Orders   Lipid panel   Hepatic function panel   Hypertension    On amlodipine.  Blood pressure has been under reasonable control.  Same medication regimen.  Spot check pressure.  Follow metabolic panel.        Relevant Orders   Basic metabolic panel   Sinus tachycardia (HCC)    Pulse on recheck 96.  Sees cardiology.  Stable.        Other Visit Diagnoses    Encounter for immunization       Relevant Medications   triamcinolone cream (KENALOG) 0.1 %   Other Relevant Orders   Flu Vaccine QUAD 36+ mos IM (Completed)       Eddie Pheasant, MD

## 2015-10-06 NOTE — Progress Notes (Signed)
Pre visit review using our clinic review tool, if applicable. No additional management support is needed unless otherwise documented below in the visit note. 

## 2015-10-11 ENCOUNTER — Encounter: Payer: Self-pay | Admitting: Internal Medicine

## 2015-10-11 NOTE — Assessment & Plan Note (Addendum)
Pulse on recheck 96.  Sees cardiology.  Stable.

## 2015-10-11 NOTE — Assessment & Plan Note (Signed)
Saw GI.  See note.  Follow liver panel.

## 2015-10-11 NOTE — Assessment & Plan Note (Signed)
Colonoscopy 06/30/15 as outlined.  Recommended f/u colonoscopy in 10 years.   

## 2015-10-11 NOTE — Assessment & Plan Note (Signed)
Followed at pain clinic.  Stable.

## 2015-10-11 NOTE — Assessment & Plan Note (Signed)
S/p surgery.  Continue f/u with ortho.  

## 2015-10-11 NOTE — Assessment & Plan Note (Signed)
On amlodipine.  Blood pressure has been under reasonable control.  Same medication regimen.  Spot check pressure.  Follow metabolic panel.

## 2015-10-11 NOTE — Assessment & Plan Note (Signed)
Low cholesterol diet and exercise.  Follow lipid panel.   

## 2015-10-27 ENCOUNTER — Telehealth: Payer: Self-pay | Admitting: Internal Medicine

## 2015-10-27 NOTE — Telephone Encounter (Signed)
Pt called needs to make a follow up visit with Dr. Nicki Reaper. A medication is not working, he keeps breaking out. Thank you!  Call pt @ [place]

## 2015-10-29 ENCOUNTER — Encounter: Payer: Self-pay | Admitting: Internal Medicine

## 2015-10-29 ENCOUNTER — Ambulatory Visit (INDEPENDENT_AMBULATORY_CARE_PROVIDER_SITE_OTHER): Payer: PPO | Admitting: Internal Medicine

## 2015-10-29 VITALS — BP 112/82 | HR 91 | Temp 98.2°F | Wt 230.6 lb

## 2015-10-29 DIAGNOSIS — I10 Essential (primary) hypertension: Secondary | ICD-10-CM

## 2015-10-29 DIAGNOSIS — G8929 Other chronic pain: Secondary | ICD-10-CM

## 2015-10-29 DIAGNOSIS — R0789 Other chest pain: Secondary | ICD-10-CM

## 2015-10-29 DIAGNOSIS — M545 Low back pain: Secondary | ICD-10-CM

## 2015-10-29 DIAGNOSIS — R079 Chest pain, unspecified: Secondary | ICD-10-CM

## 2015-10-29 DIAGNOSIS — R21 Rash and other nonspecific skin eruption: Secondary | ICD-10-CM

## 2015-10-29 NOTE — Progress Notes (Signed)
Pre visit review using our clinic review tool, if applicable. No additional management support is needed unless otherwise documented below in the visit note. 

## 2015-10-29 NOTE — Progress Notes (Signed)
Patient ID: [name], male   DOB: 1960-08-01, 55 y.o.   MRN: [id number]   Subjective:    Patient ID: [name], male    DOB: [birth year], 55 y.o.   MRN: [id number]  HPI  Patient here as a work in to follow up regarding a rash.  Rash is actually better.   He also reports that since Sunday, he has developed chest pain.  Intermittent.  Taking increased aspirin.  No pain since yesterday.  Can occur at rest of with walking.  No sob.  No acid reflux.  No abdominal pain or cramping.  Bowels stable.     Past Medical History:  Diagnosis Date  . Allergy   . Anoxic brain injury ([place])   . Chronic back pain    s/p 5 back surgeries  . Coronary artery disease    Cardiac catheterization in 2007 at [facility] showed 30% stenosis in proximal LAD. No other disease. Ejection fraction was 65%  . Depression   . H/O acute pancreatitis   . Hypercholesterolemia   . Hypertension   . Mild cognitive impairment with memory loss   . Pancreatitis    Past Surgical History:  Procedure Laterality Date  . BACK SURGERY     multiple back surgeries (5)  . CARDIAC CATHETERIZATION     [facility]   . CHOLECYSTECTOMY  2013  . COLONOSCOPY WITH PROPOFOL N/A 06/30/2015   Procedure: COLONOSCOPY WITH PROPOFOL;  Surgeon: Lollie Sails, MD;  Location: [facility] ENDOSCOPY;  Service: Endoscopy;  Laterality: N/A;  . SPINE SURGERY    . TRIGGER POINT INJECTION    . VASECTOMY     With Reversal  . VEIN SURGERY Right 2013   arm   Family History  Problem Relation Age of Onset  . Heart disease Father   . Heart attack Father   . Heart disease Mother   . Heart attack Mother   . Heart attack Brother     MI's x 2   . Hypertension    . Colon cancer     Social History   Social History  . Marital status: Married    Spouse name: N/A  . Number of children: 3  . Years of education: N/A   Social History Main Topics  . Smoking status: Never Smoker  . Smokeless tobacco: Never Used  . Alcohol use No  . Drug use: No  . Sexual  activity: Not Asked   Other Topics Concern  . None   Social History Narrative  . None    Outpatient Encounter Prescriptions as of 10/29/2015  Medication Sig  . amLODipine (NORVASC) 5 MG tablet Take by mouth.  Marland Kitchen amphetamine-dextroamphetamine (ADDERALL) 20 MG tablet Take 20 mg by mouth 3 (three) times daily.  . cetirizine (ZYRTEC) 10 MG tablet Take 10 mg by mouth daily.  . diazepam (VALIUM) 5 MG tablet Take 5-10 mg by mouth every 8 (eight) hours as needed (pain). Reported on 04/08/2015  . fluticasone (FLONASE) 50 MCG/ACT nasal spray PLACE 2 SPRAYS INTO BOTH NOSTRILS DAILY.  Marland Kitchen lipase/protease/amylase (CREON) 12000 units CPEP capsule Take 24,000 Units by mouth 3 (three) times daily with meals. Reported on 07/06/2015  . naproxen (NAPROSYN) 500 MG tablet Take by mouth. Reported on 07/06/2015  . tiZANidine (ZANAFLEX) 4 MG tablet Take 4 mg by mouth 3 (three) times daily.  Marland Kitchen triamcinolone cream (KENALOG) 0.1 % Apply 1 application topically 2 (two) times daily. Apply to affected area bid (on neck)  . [DISCONTINUED] HYDROcodone-acetaminophen ([place])  5-325 MG tablet Reported on 07/06/2015  . [DISCONTINUED] oxyCODONE (OXY IR/ROXICODONE) 5 MG immediate release tablet Reported on 07/06/2015  . [DISCONTINUED] oxyCODONE-acetaminophen (ROXICET) 5-325 MG tablet Take 1 tablet by mouth every 6 (six) hours as needed for severe pain.  . [DISCONTINUED] traMADol (ULTRAM) 50 MG tablet Take 1 tablet (50 mg total) by mouth every 6 (six) hours as needed for moderate pain.   No facility-administered encounter medications on file as of 10/29/2015.     Review of Systems  Constitutional: Negative for appetite change and unexpected weight change.  HENT: Negative for congestion and sinus pressure.   Respiratory: Negative for cough and shortness of breath.   Cardiovascular: Positive for chest pain. Negative for palpitations and leg swelling.  Gastrointestinal: Negative for abdominal pain, diarrhea, nausea and vomiting.  Skin:  Positive for rash. Negative for color change.  Neurological: Negative for dizziness and headaches.       Objective:    Physical Exam  Constitutional: He appears well-developed and well-nourished. No distress.  HENT:  Nose: Nose normal.  Mouth/Throat: Oropharynx is clear and moist.  Neck: Neck supple.  Cardiovascular: Normal rate and regular rhythm.   Pulmonary/Chest: Effort normal and breath sounds normal. No respiratory distress.  Abdominal: Soft. Bowel sounds are normal. There is no tenderness.  Musculoskeletal: He exhibits no edema or tenderness.  Lymphadenopathy:    He has no cervical adenopathy.  Skin:  Rash better - on arm.    Psychiatric: He has a normal mood and affect. His behavior is normal.    BP 112/82   Pulse 91   Temp 98.2 F (36.8 C) (Oral)   Wt 230 lb 9.6 oz (104.6 kg)   SpO2 95%   BMI 30.42 kg/m  Wt Readings from Last 3 Encounters:  10/29/15 230 lb 9.6 oz (104.6 kg)  10/06/15 228 lb 2 oz (103.5 kg)  07/06/15 227 lb 6.4 oz (103.1 kg)     Lab Results  Component Value Date   WBC 8.5 04/30/2015   HGB 15.0 04/30/2015   HCT 43.6 04/30/2015   PLT 320.0 04/30/2015   GLUCOSE 93 09/30/2015   CHOL 181 09/30/2015   TRIG 179.0 (H) 09/30/2015   HDL 41.90 09/30/2015   LDLCALC 103 (H) 09/30/2015   ALT 38 09/30/2015   AST 29 09/30/2015   NA 138 09/30/2015   K 4.1 09/30/2015   CL 104 09/30/2015   CREATININE 1.11 09/30/2015   BUN 16 09/30/2015   CO2 26 09/30/2015   TSH 1.42 04/30/2015   PSA 0.72 06/12/2014   INR 0.9 10/27/2011   HGBA1C 5.3 09/14/2012       Assessment & Plan:   Problem List Items Addressed This Visit    Chest pain    Chest pain as outlined.  EKG - SR with no acute ischemic changes.  Given history and persistent intermittent chest pain, will refer to cardiology for further evaluation.  He is seen at [facility].  They will see him today.        Chronic back pain    Followed by pain clinic.       Hypertension    Blood pressure  under good control.  Continue same medication regimen.  Follow pressures.  Follow metabolic panel.        Rash    Other Visit Diagnoses    Chest pain, unspecified chest pain type    -  Primary   Relevant Orders   EKG 12-Lead (Completed)     I spent 25  minutes with the patient and more than 50% of the time was spent in consultation regarding the above.     Einar Pheasant, MD

## 2015-10-29 NOTE — Patient Instructions (Signed)
appt to see Dr Saralyn Pilar today at 2:45 pm.

## 2015-11-01 DIAGNOSIS — R21 Rash and other nonspecific skin eruption: Secondary | ICD-10-CM | POA: Insufficient documentation

## 2015-11-01 DIAGNOSIS — R079 Chest pain, unspecified: Secondary | ICD-10-CM | POA: Insufficient documentation

## 2015-11-01 NOTE — Assessment & Plan Note (Signed)
Blood pressure under good control.  Continue same medication regimen.  Follow pressures.  Follow metabolic panel.   

## 2015-11-01 NOTE — Assessment & Plan Note (Signed)
Chest pain as outlined.  EKG - SR with no acute ischemic changes.  Given history and persistent intermittent chest pain, will refer to cardiology for further evaluation.  He is seen at [facility].  They will see him today.

## 2015-11-01 NOTE — Assessment & Plan Note (Signed)
Followed by pain clinic.  

## 2015-11-24 ENCOUNTER — Other Ambulatory Visit: Payer: Self-pay | Admitting: Internal Medicine

## 2016-02-04 ENCOUNTER — Other Ambulatory Visit: Payer: PPO

## 2016-02-08 ENCOUNTER — Telehealth: Payer: Self-pay | Admitting: *Deleted

## 2016-02-08 ENCOUNTER — Encounter: Payer: Self-pay | Admitting: Internal Medicine

## 2016-02-08 ENCOUNTER — Other Ambulatory Visit (INDEPENDENT_AMBULATORY_CARE_PROVIDER_SITE_OTHER): Payer: Medicare HMO

## 2016-02-08 DIAGNOSIS — Z8719 Personal history of other diseases of the digestive system: Secondary | ICD-10-CM | POA: Diagnosis not present

## 2016-02-08 DIAGNOSIS — I10 Essential (primary) hypertension: Secondary | ICD-10-CM

## 2016-02-08 DIAGNOSIS — E7800 Pure hypercholesterolemia, unspecified: Secondary | ICD-10-CM

## 2016-02-08 LAB — HEPATIC FUNCTION PANEL
ALT: 28 U/L (ref 0–53)
AST: 21 U/L (ref 0–37)
Albumin: 4.6 g/dL (ref 3.5–5.2)
Alkaline Phosphatase: 123 U/L — ABNORMAL HIGH (ref 39–117)
BILIRUBIN TOTAL: 0.5 mg/dL (ref 0.2–1.2)
Bilirubin, Direct: 0.1 mg/dL (ref 0.0–0.3)
TOTAL PROTEIN: 8 g/dL (ref 6.0–8.3)

## 2016-02-08 LAB — LIPID PANEL
CHOLESTEROL: 177 mg/dL (ref 0–200)
HDL: 39.7 mg/dL (ref >39.00)
LDL CALC: 100 mg/dL — AB (ref 0–99)
NonHDL: 137.55
Total CHOL/HDL Ratio: 4
Triglycerides: 189 mg/dL — ABNORMAL HIGH (ref 0.0–149.0)
VLDL: 37.8 mg/dL (ref 0.0–40.0)

## 2016-02-08 LAB — BASIC METABOLIC PANEL
BUN: 16 mg/dL (ref 6–23)
CO2: 26 mEq/L (ref 19–32)
Calcium: 9.7 mg/dL (ref 8.4–10.5)
Chloride: 102 mEq/L (ref 96–112)
Creatinine, Ser: 1.02 mg/dL (ref 0.40–1.50)
GFR: 81.06 mL/min (ref >60.00)
GLUCOSE: 97 mg/dL (ref 70–99)
POTASSIUM: 5 meq/L (ref 3.5–5.1)
SODIUM: 136 meq/L (ref 135–145)

## 2016-02-08 LAB — LIPASE: LIPASE: 37 U/L (ref 11.0–59.0)

## 2016-02-08 NOTE — Addendum Note (Signed)
Addended by: Leeanne Rio on: 02/08/2016 02:13 PM   Modules accepted: Orders

## 2016-02-08 NOTE — Telephone Encounter (Signed)
Thanks, add on sheet faxed

## 2016-02-08 NOTE — Telephone Encounter (Signed)
Pt came in for labs thi morning (Hepatic, Lipid, Metabolic panel). Pt requested a Lipase also. If okay, please order.

## 2016-02-08 NOTE — Telephone Encounter (Signed)
Order placed for lipase.

## 2016-02-10 ENCOUNTER — Ambulatory Visit (INDEPENDENT_AMBULATORY_CARE_PROVIDER_SITE_OTHER): Payer: Medicare HMO | Admitting: Internal Medicine

## 2016-02-10 ENCOUNTER — Encounter: Payer: Self-pay | Admitting: Internal Medicine

## 2016-02-10 DIAGNOSIS — R5383 Other fatigue: Secondary | ICD-10-CM

## 2016-02-10 DIAGNOSIS — I10 Essential (primary) hypertension: Secondary | ICD-10-CM | POA: Diagnosis not present

## 2016-02-10 DIAGNOSIS — R945 Abnormal results of liver function studies: Secondary | ICD-10-CM

## 2016-02-10 DIAGNOSIS — R413 Other amnesia: Secondary | ICD-10-CM | POA: Diagnosis not present

## 2016-02-10 DIAGNOSIS — R1010 Upper abdominal pain, unspecified: Secondary | ICD-10-CM

## 2016-02-10 DIAGNOSIS — E7800 Pure hypercholesterolemia, unspecified: Secondary | ICD-10-CM

## 2016-02-10 DIAGNOSIS — R7989 Other specified abnormal findings of blood chemistry: Secondary | ICD-10-CM

## 2016-02-10 DIAGNOSIS — G8929 Other chronic pain: Secondary | ICD-10-CM

## 2016-02-10 DIAGNOSIS — Z8719 Personal history of other diseases of the digestive system: Secondary | ICD-10-CM

## 2016-02-10 DIAGNOSIS — M545 Low back pain: Secondary | ICD-10-CM

## 2016-02-10 DIAGNOSIS — M25562 Pain in left knee: Secondary | ICD-10-CM

## 2016-02-10 NOTE — Progress Notes (Signed)
Patient ID: [name], male   DOB: 1960/02/22, 56 y.o.   MRN: [id number]   Subjective:    Patient ID: [name], male    DOB: 1960-11-30, 56 y.o.   MRN: [id number]  HPI  Patient here for a scheduled follow up.  Has chronic low back pain.  Followed by Dr Claudia Desanctis.  reveiving trigger point injections.  On tizanidine.  Stable.  Last seen 01/06/16.   Was also evaluated by cardiology recently.  Had negative Lixiscan sestamibi study.   Has noticed some increased drainage and sneezing.  Just started.  Taking claritin and using flonase.  No chest congestion.  No sob.  Does report feeling fatigued.  Does not feel rested when wakes up in the morning.  Increased daytime fatigue and somnolence.  Discussed sleep study.  Discussed diet and exercise.  Walking.  States needs left knee surgery.  Right knee ok now - wears brace.  Taking adderall again.   Only taking one daily.  Helps him concentrate.  Still has some upper abdominal discomfort.  Not taking protonix.  Plans to restart.  Was scheduled for EGD.  Cancelled.  Wants to be rescheduled.      Past Medical History:  Diagnosis Date  . Allergy   . Anoxic brain injury ([place])   . Chronic back pain    s/p 5 back surgeries  . Coronary artery disease    Cardiac catheterization in 2007 at [facility] showed 30% stenosis in proximal LAD. No other disease. Ejection fraction was 65%  . Depression   . H/O acute pancreatitis   . Hypercholesterolemia   . Hypertension   . Mild cognitive impairment with memory loss   . Pancreatitis    Past Surgical History:  Procedure Laterality Date  . BACK SURGERY     multiple back surgeries (5)  . CARDIAC CATHETERIZATION     [facility]   . CHOLECYSTECTOMY  2013  . COLONOSCOPY WITH PROPOFOL N/A 06/30/2015   Procedure: COLONOSCOPY WITH PROPOFOL;  Surgeon: Lollie Sails, MD;  Location: [facility] ENDOSCOPY;  Service: Endoscopy;  Laterality: N/A;  . SPINE SURGERY    . TRIGGER POINT INJECTION    . VASECTOMY     With Reversal  . VEIN  SURGERY Right 2013   arm   Family History  Problem Relation Age of Onset  . Heart disease Father   . Heart attack Father   . Heart disease Mother   . Heart attack Mother   . Heart attack Brother     MI's x 2   . Hypertension    . Colon cancer     Social History   Social History  . Marital status: Married    Spouse name: N/A  . Number of children: 3  . Years of education: N/A   Social History Main Topics  . Smoking status: Never Smoker  . Smokeless tobacco: Never Used  . Alcohol use No  . Drug use: No  . Sexual activity: Not Asked   Other Topics Concern  . None   Social History Narrative  . None    Outpatient Encounter Prescriptions as of 02/10/2016  Medication Sig  . amLODipine (NORVASC) 5 MG tablet Take by mouth.  Marland Kitchen amphetamine-dextroamphetamine (ADDERALL) 20 MG tablet Take 20 mg by mouth 3 (three) times daily.  . Ascorbic Acid (VITAMIN C) 1000 MG tablet Take 1,000 mg by mouth daily.  . cetirizine (ZYRTEC) 10 MG tablet Take 10 mg by mouth daily.  . diazepam (VALIUM) 5  MG tablet Take 5-10 mg by mouth every 8 (eight) hours as needed (pain). Reported on 04/08/2015  . fluticasone (FLONASE) 50 MCG/ACT nasal spray PLACE 2 SPRAYS INTO BOTH NOSTRILS DAILY.  Marland Kitchen lipase/protease/amylase (CREON) 12000 units CPEP capsule Take 24,000 Units by mouth 3 (three) times daily with meals. Reported on 07/06/2015  . naproxen (NAPROSYN) 500 MG tablet Take by mouth. Reported on 07/06/2015  . Omega-3 Fatty Acids (FISH OIL) 500 MG CAPS Take by mouth daily. Pt doesn't how may MG he's taking.  Marland Kitchen tiZANidine (ZANAFLEX) 4 MG tablet Take 4 mg by mouth 3 (three) times daily.  Marland Kitchen triamcinolone cream (KENALOG) 0.1 % Apply 1 application topically 2 (two) times daily. Apply to affected area bid (on neck)  . [DISCONTINUED] fluticasone (FLONASE) 50 MCG/ACT nasal spray PLACE 2 SPRAYS INTO BOTH NOSTRILS DAILY. (Patient not taking: Reported on 02/10/2016)   No facility-administered encounter medications on file as of  02/10/2016.     Review of Systems  Constitutional: Positive for fatigue. Negative for appetite change and unexpected weight change.       Has tried to adjust his diet some.   HENT: Positive for congestion and postnasal drip.   Respiratory: Negative for cough, chest tightness and shortness of breath.   Cardiovascular: Negative for chest pain, palpitations and leg swelling.  Gastrointestinal: Positive for abdominal pain. Negative for diarrhea, nausea and vomiting.  Genitourinary: Negative for difficulty urinating and dysuria.  Musculoskeletal: Positive for back pain. Negative for joint swelling.  Skin: Negative for color change and rash.  Neurological: Negative for dizziness, light-headedness and headaches.  Psychiatric/Behavioral: Negative for agitation and dysphoric mood.       Objective:     Blood pressure rechecked by me:  136/84  Physical Exam  Constitutional: He appears well-developed and well-nourished. No distress.  HENT:  Nose: Nose normal.  Mouth/Throat: Oropharynx is clear and moist.  Eyes: Conjunctivae are normal. Right eye exhibits no discharge. Left eye exhibits no discharge.  Neck: Neck supple. No thyromegaly present.  Cardiovascular: Normal rate and regular rhythm.   Pulmonary/Chest: Effort normal and breath sounds normal. No respiratory distress.  Abdominal: Soft. Bowel sounds are normal. There is no tenderness.  Musculoskeletal: He exhibits no edema or tenderness.  Lymphadenopathy:    He has no cervical adenopathy.  Skin: No rash noted. No erythema.  Psychiatric: He has a normal mood and affect. His behavior is normal.    BP 140/88   Pulse 95   Wt 231 lb (104.8 kg)   SpO2 95%   BMI 30.48 kg/m  Wt Readings from Last 3 Encounters:  02/10/16 231 lb (104.8 kg)  10/29/15 230 lb 9.6 oz (104.6 kg)  10/06/15 228 lb 2 oz (103.5 kg)     Lab Results  Component Value Date   WBC 8.5 04/30/2015   HGB 15.0 04/30/2015   HCT 43.6 04/30/2015   PLT 320.0  04/30/2015   GLUCOSE 97 02/08/2016   CHOL 177 02/08/2016   TRIG 189.0 (H) 02/08/2016   HDL 39.70 02/08/2016   LDLCALC 100 (H) 02/08/2016   ALT 28 02/08/2016   AST 21 02/08/2016   NA 136 02/08/2016   K 5.0 02/08/2016   CL 102 02/08/2016   CREATININE 1.02 02/08/2016   BUN 16 02/08/2016   CO2 26 02/08/2016   TSH 1.42 04/30/2015   PSA 0.72 06/12/2014   INR 0.9 10/27/2011   HGBA1C 5.3 09/14/2012       Assessment & Plan:   Problem List Items Addressed This  Visit    Abdominal pain    Pain in upper abdomen as outlined.  Will restart protonix.  Saw GI previously.  Was planning for EGD.  Cancelled.  Wants to reschedule.  Order placed for referral.        Relevant Orders   Ambulatory referral to Gastroenterology   Abnormal liver function tests    Saw GI.  Recent liver panel wnl.        Chronic back pain    Followed by pain clinic.       Fatigue    Probably multifactorial.  Discussed diet and exercise.  Describes not feeling rested when he wakes up in the morning.  Increased daytime somnolence and fatigue.  Will check split night sleep study.        Relevant Orders   Ambulatory referral to Sleep Studies   History of pancreatitis    Recent lipase wnl.       Hypercholesteremia    Low cholesterol diet and exercise.  Follow lipid panel.        Hypertension    Recheck improved.  Has been under good control.  Follow pressures.  Follow metabolic panel.       Left knee pain    Seeing ortho.       Short-term memory loss    Worked up by [facility] neurology.  Stable.           Einar Pheasant, MD

## 2016-02-15 ENCOUNTER — Encounter: Payer: Self-pay | Admitting: Internal Medicine

## 2016-02-15 DIAGNOSIS — R5383 Other fatigue: Secondary | ICD-10-CM | POA: Insufficient documentation

## 2016-02-15 DIAGNOSIS — R109 Unspecified abdominal pain: Secondary | ICD-10-CM | POA: Insufficient documentation

## 2016-02-15 NOTE — Assessment & Plan Note (Signed)
Saw GI.  Recent liver panel wnl.

## 2016-02-15 NOTE — Assessment & Plan Note (Signed)
Low cholesterol diet and exercise.  Follow lipid panel.   

## 2016-02-15 NOTE — Assessment & Plan Note (Signed)
Probably multifactorial.  Discussed diet and exercise.  Describes not feeling rested when he wakes up in the morning.  Increased daytime somnolence and fatigue.  Will check split night sleep study.

## 2016-02-15 NOTE — Assessment & Plan Note (Signed)
Worked up by [facility] neurology.  Stable.

## 2016-02-15 NOTE — Assessment & Plan Note (Signed)
Pain in upper abdomen as outlined.  Will restart protonix.  Saw GI previously.  Was planning for EGD.  Cancelled.  Wants to reschedule.  Order placed for referral.

## 2016-02-15 NOTE — Assessment & Plan Note (Signed)
Seeing ortho.   

## 2016-02-15 NOTE — Assessment & Plan Note (Signed)
Followed by pain clinic.  

## 2016-02-15 NOTE — Assessment & Plan Note (Signed)
Recent lipase wnl.

## 2016-02-15 NOTE — Assessment & Plan Note (Signed)
Recheck improved.  Has been under good control.  Follow pressures.  Follow metabolic panel.

## 2016-03-16 DIAGNOSIS — M79602 Pain in left arm: Secondary | ICD-10-CM | POA: Diagnosis not present

## 2016-03-16 DIAGNOSIS — R202 Paresthesia of skin: Secondary | ICD-10-CM | POA: Diagnosis not present

## 2016-03-16 DIAGNOSIS — M791 Myalgia: Secondary | ICD-10-CM | POA: Diagnosis not present

## 2016-03-16 DIAGNOSIS — G8929 Other chronic pain: Secondary | ICD-10-CM | POA: Diagnosis not present

## 2016-03-16 DIAGNOSIS — Z8639 Personal history of other endocrine, nutritional and metabolic disease: Secondary | ICD-10-CM | POA: Diagnosis not present

## 2016-03-16 DIAGNOSIS — M5030 Other cervical disc degeneration, unspecified cervical region: Secondary | ICD-10-CM | POA: Diagnosis not present

## 2016-03-16 DIAGNOSIS — M5136 Other intervertebral disc degeneration, lumbar region: Secondary | ICD-10-CM | POA: Diagnosis not present

## 2016-03-16 DIAGNOSIS — M25511 Pain in right shoulder: Secondary | ICD-10-CM | POA: Diagnosis not present

## 2016-03-21 ENCOUNTER — Other Ambulatory Visit: Payer: Self-pay | Admitting: Gastroenterology

## 2016-03-21 DIAGNOSIS — R1310 Dysphagia, unspecified: Secondary | ICD-10-CM

## 2016-03-21 DIAGNOSIS — R1013 Epigastric pain: Secondary | ICD-10-CM | POA: Diagnosis not present

## 2016-03-24 ENCOUNTER — Ambulatory Visit
Admission: RE | Admit: 2016-03-24 | Discharge: 2016-03-24 | Disposition: A | Discharge status: Home / Self Care | Payer: Medicare HMO | Source: Ambulatory Visit | Attending: Gastroenterology | Admitting: Gastroenterology

## 2016-03-24 DIAGNOSIS — R1310 Dysphagia, unspecified: Secondary | ICD-10-CM | POA: Diagnosis not present

## 2016-03-24 DIAGNOSIS — K449 Diaphragmatic hernia without obstruction or gangrene: Secondary | ICD-10-CM | POA: Diagnosis not present

## 2016-03-24 DIAGNOSIS — R1013 Epigastric pain: Secondary | ICD-10-CM | POA: Diagnosis not present

## 2016-03-27 ENCOUNTER — Emergency Department
Admission: EM | Admit: 2016-03-27 | Discharge: 2016-03-27 | Disposition: A | Discharge status: Home / Self Care | Payer: Medicare HMO | Attending: Emergency Medicine | Admitting: Emergency Medicine

## 2016-03-27 ENCOUNTER — Emergency Department: Payer: Medicare HMO

## 2016-03-27 DIAGNOSIS — I2510 Atherosclerotic heart disease of native coronary artery without angina pectoris: Secondary | ICD-10-CM | POA: Diagnosis not present

## 2016-03-27 DIAGNOSIS — Z79899 Other long term (current) drug therapy: Secondary | ICD-10-CM | POA: Diagnosis not present

## 2016-03-27 DIAGNOSIS — R102 Pelvic and perineal pain: Secondary | ICD-10-CM | POA: Diagnosis not present

## 2016-03-27 DIAGNOSIS — R1013 Epigastric pain: Secondary | ICD-10-CM | POA: Diagnosis not present

## 2016-03-27 DIAGNOSIS — R109 Unspecified abdominal pain: Secondary | ICD-10-CM | POA: Diagnosis not present

## 2016-03-27 DIAGNOSIS — R1010 Upper abdominal pain, unspecified: Secondary | ICD-10-CM | POA: Diagnosis present

## 2016-03-27 DIAGNOSIS — I10 Essential (primary) hypertension: Secondary | ICD-10-CM | POA: Insufficient documentation

## 2016-03-27 DIAGNOSIS — I700 Atherosclerosis of aorta: Secondary | ICD-10-CM | POA: Diagnosis not present

## 2016-03-27 LAB — COMPREHENSIVE METABOLIC PANEL
ALBUMIN: 4.3 g/dL (ref 3.5–5.0)
ALK PHOS: 86 U/L (ref 38–126)
ALT: 26 U/L (ref 17–63)
ANION GAP: 7 (ref 5–15)
AST: 27 U/L (ref 15–41)
BILIRUBIN TOTAL: 0.2 mg/dL — AB (ref 0.3–1.2)
BUN: 18 mg/dL (ref 6–20)
CALCIUM: 8.7 mg/dL — AB (ref 8.9–10.3)
CO2: 24 mmol/L (ref 22–32)
Chloride: 108 mmol/L (ref 101–111)
Creatinine, Ser: 1 mg/dL (ref 0.61–1.24)
GFR calc Af Amer: >60 mL/min (ref >60)
GFR calc non Af Amer: >60 mL/min (ref >60)
GLUCOSE: 96 mg/dL (ref 65–99)
Potassium: 3.9 mmol/L (ref 3.5–5.1)
SODIUM: 139 mmol/L (ref 135–145)
TOTAL PROTEIN: 7.7 g/dL (ref 6.5–8.1)

## 2016-03-27 LAB — CBC
HCT: 41.5 % (ref 40.0–52.0)
HEMOGLOBIN: 14.8 g/dL (ref 13.0–18.0)
MCH: 32.2 pg (ref 26.0–34.0)
MCHC: 35.6 g/dL (ref 32.0–36.0)
MCV: 90.3 fL (ref 80.0–100.0)
Platelets: 277 10*3/uL (ref 150–440)
RBC: 4.6 MIL/uL (ref 4.40–5.90)
RDW: 13 % (ref 11.5–14.5)
WBC: 7.3 10*3/uL (ref 3.8–10.6)

## 2016-03-27 LAB — LIPASE, BLOOD: Lipase: 42 U/L (ref 11–51)

## 2016-03-27 LAB — URINALYSIS, COMPLETE (UACMP) WITH MICROSCOPIC
BILIRUBIN URINE: NEGATIVE
Bacteria, UA: NONE SEEN
Glucose, UA: NEGATIVE mg/dL
HGB URINE DIPSTICK: NEGATIVE
Ketones, ur: NEGATIVE mg/dL
Leukocytes, UA: NEGATIVE
NITRITE: NEGATIVE
PH: 7 (ref 5.0–8.0)
Protein, ur: NEGATIVE mg/dL
RBC / HPF: NONE SEEN RBC/hpf (ref 0–5)
SPECIFIC GRAVITY, URINE: 1.003 — AB (ref 1.005–1.030)
WBC UA: NONE SEEN WBC/hpf (ref 0–5)

## 2016-03-27 LAB — TROPONIN I

## 2016-03-27 MED ORDER — PROMETHAZINE HCL 25 MG/ML IJ SOLN
12.5000 mg | Freq: Once | INTRAMUSCULAR | Status: AC
  Administered 2016-03-27: 12.5 mg via INTRAMUSCULAR
  Filled 2016-03-27: qty 1

## 2016-03-27 MED ORDER — IOPAMIDOL (ISOVUE-300) INJECTION 61%
30.0000 mL | Freq: Once | INTRAVENOUS | Status: AC | PRN
  Administered 2016-03-27: 30 mL via ORAL

## 2016-03-27 MED ORDER — ONDANSETRON HCL 4 MG/2ML IJ SOLN
4.0000 mg | Freq: Once | INTRAMUSCULAR | Status: AC | PRN
  Administered 2016-03-27: 4 mg via INTRAVENOUS
  Filled 2016-03-27: qty 2

## 2016-03-27 MED ORDER — HYDROMORPHONE HCL 1 MG/ML IJ SOLN
0.5000 mg | INTRAMUSCULAR | Status: AC
  Administered 2016-03-27: 0.5 mg via INTRAVENOUS
  Filled 2016-03-27: qty 1

## 2016-03-27 MED ORDER — IOPAMIDOL (ISOVUE-300) INJECTION 61%
100.0000 mL | Freq: Once | INTRAVENOUS | Status: AC | PRN
  Administered 2016-03-27: 100 mL via INTRAVENOUS

## 2016-03-27 MED ORDER — ONDANSETRON 4 MG PO TBDP: 4.0000 mg | ORAL_TABLET | Freq: Four times a day (QID) | ORAL | 0 refills | Status: DC | PRN

## 2016-03-27 MED ORDER — ONDANSETRON HCL 4 MG/2ML IJ SOLN
4.0000 mg | Freq: Once | INTRAMUSCULAR | Status: AC
  Administered 2016-03-27: 4 mg via INTRAVENOUS
  Filled 2016-03-27: qty 2

## 2016-03-27 MED ORDER — SODIUM CHLORIDE 0.9 % IV BOLUS (SEPSIS)
1000.0000 mL | Freq: Once | INTRAVENOUS | Status: AC
  Administered 2016-03-27: 1000 mL via INTRAVENOUS

## 2016-03-27 MED ORDER — HYDROMORPHONE HCL 1 MG/ML IJ SOLN
1.0000 mg | INTRAMUSCULAR | Status: AC
  Administered 2016-03-27: 1 mg via INTRAVENOUS
  Filled 2016-03-27: qty 1

## 2016-03-27 NOTE — ED Notes (Signed)
Pt finished oral contrast.  CT notified.

## 2016-03-27 NOTE — ED Triage Notes (Signed)
Pt c/o abd pain with N/v for the past 3 days, states he has a hx of pancreatitis and this feels the same.

## 2016-03-27 NOTE — ED Provider Notes (Signed)
[facility] Emergency Department Provider Note ____________________________________________   First MD Initiated Contact with Patient 03/27/16 1114     (approximate)  I have reviewed the triage vital signs and the nursing notes.   HISTORY  Chief Complaint Abdominal Pain  HPI [name] is a 56 y.o. male for evaluation of upper abdominal pain  Patient is experiencing couple of months of an upper abdominal discomfort, he had a GI study a couple days ago, and then hasn't upcoming appointment for endoscopy. He reports last 2-3 days the pain is increased, occasional vomiting, nausea, and is been able to keep down only clear fluids, he's been drinking water primarily. No fevers or chills. No chest pain or shortness of breath  Currently reports moderate sharp upper abdominal pain is somewhat stabbing in nature. No flank pain. No right upper quadrant pain. Previous cholecystectomy. Reports same pain in the past with "pancreatitis"   Past Medical History:  Diagnosis Date  . Allergy   . Anoxic brain injury ([place])   . Chronic back pain    s/p 5 back surgeries  . Coronary artery disease    Cardiac catheterization in 2007 at [facility] showed 30% stenosis in proximal LAD. No other disease. Ejection fraction was 65%  . Depression   . H/O acute pancreatitis   . Hypercholesterolemia   . Hypertension   . Mild cognitive impairment with memory loss   . Pancreatitis     Patient Active Problem List   Diagnosis Date Noted  . Abdominal pain 02/15/2016  . Fatigue 02/15/2016  . Chest pain 11/01/2015  . Rash 11/01/2015  . Diverticulosis of colon without hemorrhage 07/04/2015  . Visit for suture removal 05/19/2015  . Abnormal liver function tests 05/03/2015  . Left knee pain 04/08/2015  . Complete rotator cuff rupture of left shoulder 03/26/2015  . Hypertension 03/01/2015  . Fungal infection of foot 10/18/2014  . Health care maintenance 06/16/2014  . History of  pancreatitis 06/12/2014  . Sinus tachycardia 06/27/2013  . DOE (dyspnea on exertion) 06/04/2013  . Sebaceous cyst 02/05/2013  . Hyponatremia 11/04/2012  . Short-term memory loss 05/06/2012  . Anemia 05/06/2012  . Neck fullness 03/27/2012  . Chronic back pain 12/01/2011  . Hypercholesteremia 12/01/2011  . Clinical depression 07/29/2011  . Failed back syndrome of lumbar spine 07/22/2011    Past Surgical History:  Procedure Laterality Date  . BACK SURGERY     multiple back surgeries (5)  . CARDIAC CATHETERIZATION     [facility]   . CHOLECYSTECTOMY  2013  . COLONOSCOPY WITH PROPOFOL N/A 06/30/2015   Procedure: COLONOSCOPY WITH PROPOFOL;  Surgeon: Lollie Sails, MD;  Location: [facility] ENDOSCOPY;  Service: Endoscopy;  Laterality: N/A;  . SPINE SURGERY    . TRIGGER POINT INJECTION    . VASECTOMY     With Reversal  . VEIN SURGERY Right 2013   arm    Prior to Admission medications   Medication Sig Start Date End Date Taking? Authorizing Provider  amphetamine-dextroamphetamine (ADDERALL) 20 MG tablet Take 20 mg by mouth 3 (three) times daily.   Yes Historical Provider, MD  Ascorbic Acid (VITAMIN C) 1000 MG tablet Take 1,000 mg by mouth daily.   Yes Historical Provider, MD  cholecalciferol (VITAMIN D) 1000 units tablet Take 1,000 Units by mouth daily.   Yes Historical Provider, MD  fluticasone (FLONASE) 50 MCG/ACT nasal spray PLACE 2 SPRAYS INTO BOTH NOSTRILS DAILY. 11/24/15  Yes Einar Pheasant, MD  lipase/protease/amylase (CREON) 12000 units CPEP  capsule Take 24,000 Units by mouth 3 (three) times daily with meals. Reported on 07/06/2015   Yes Historical Provider, MD  naproxen (NAPROSYN) 500 MG tablet Take by mouth. Reported on 07/06/2015 04/09/15  Yes Historical Provider, MD  tiZANidine (ZANAFLEX) 4 MG tablet Take 4 mg by mouth 3 (three) times daily.   Yes Historical Provider, MD  amLODipine (NORVASC) 5 MG tablet Take by mouth. 04/02/15 04/01/16  Historical Provider, MD  cetirizine (ZYRTEC) 10 MG  tablet Take 10 mg by mouth daily.    Historical Provider, MD  Omega-3 Fatty Acids (FISH OIL) 500 MG CAPS Take by mouth daily. Pt doesn't how may MG he's taking.    Historical Provider, MD  ondansetron (ZOFRAN ODT) 4 MG disintegrating tablet Take 1 tablet (4 mg total) by mouth every 6 (six) hours as needed for nausea or vomiting. 03/27/16   Delman Kitten, MD  triamcinolone cream (KENALOG) 0.1 % Apply 1 application topically 2 (two) times daily. Apply to affected area bid (on neck) Patient not taking: Reported on 03/27/2016 10/06/15   Einar Pheasant, MD    Allergies Penicillins and Sulfa antibiotics  Family History  Problem Relation Age of Onset  . Heart disease Father   . Heart attack Father   . Heart disease Mother   . Heart attack Mother   . Heart attack Brother     MI's x 2   . Hypertension    . Colon cancer      Social History Social History  Substance Use Topics  . Smoking status: Never Smoker  . Smokeless tobacco: Never Used  . Alcohol use No    Review of Systems Constitutional: No fever/chills Eyes: No visual changes. ENT: No sore throat. Cardiovascular: Denies chest pain. Respiratory: Denies shortness of breath. Gastrointestinal:   No diarrhea.  No constipation. Genitourinary: Negative for dysuria. Musculoskeletal: Negative for back pain. Skin: Negative for rash. Neurological: Negative for headaches, focal weakness or numbness.  10-point ROS otherwise negative.  ____________________________________________   PHYSICAL EXAM:  VITAL SIGNS: ED Triage Vitals  Enc Vitals Group     BP 03/27/16 1004 136/90     Pulse Rate 03/27/16 1004 94     Resp 03/27/16 1004 18     Temp 03/27/16 1004 98.2 F (36.8 C)     Temp Source 03/27/16 1004 Oral     SpO2 03/27/16 1004 97 %     Weight 03/27/16 1002 248 lb (112.5 kg)     Height 03/27/16 1002 6\' 1"  (1.854 m)     Head Circumference --      Peak Flow --      Pain Score 03/27/16 1002 10     Pain Loc --      Pain Edu? --       Excl. in [place]? --     Constitutional: Alert and oriented. Well appearing and in no acute distress. Eyes: Conjunctivae are normal. PERRL. EOMI. Head: Atraumatic. Nose: No congestion/rhinnorhea. Mouth/Throat: Mucous membranes are moist.  Oropharynx non-erythematous. Neck: No stridor.   Cardiovascular: Normal rate, regular rhythm. Grossly normal heart sounds.  Good peripheral circulation. Respiratory: Normal respiratory effort.  No retractions. Lungs CTAB. Gastrointestinal: Soft and nontender throughout, except for mild tenderness without rebound or guarding in the epigastrium. There is minimal scarring which is old in the right upper quadrant, patient reports from previous gallbladder removal.. No distention. No abdominal bruits. No CVA tenderness. Musculoskeletal: No lower extremity tenderness nor edema.  No joint effusions. Neurologic:  Normal speech and language.  No gross focal neurologic deficits are appreciated. No gait instability. Skin:  Skin is warm, dry and intact. No rash noted. Psychiatric: Mood and affect are normal. Speech and behavior are normal.  ____________________________________________   LABS (all labs ordered are listed, but only abnormal results are displayed)  Labs Reviewed  COMPREHENSIVE METABOLIC PANEL - Abnormal; Notable for the following:       Result Value   Calcium 8.7 (*)    Total Bilirubin 0.2 (*)    All other components within normal limits  URINALYSIS, COMPLETE (UACMP) WITH MICROSCOPIC - Abnormal; Notable for the following:    Color, Urine COLORLESS (*)    APPearance CLEAR (*)    Specific Gravity, Urine 1.003 (*)    Squamous Epithelial / LPF 0-5 (*)    All other components within normal limits  LIPASE, BLOOD  CBC  TROPONIN I   ____________________________________________  EKG  Reviewed injury by me at 10:35 AM Heart rate 85 10 QTc 460 Normal Sinus Rhythm, Possible Old Lateral Infarct with Q Waves Noted in 1 and AVL. No acute ischemia is  noted ____________________________________________  RADIOLOGY  Ct Abdomen Pelvis W Contrast  Result Date: 03/27/2016 CLINICAL DATA:  56 year old male with abdominal and pelvic pain with nausea for 4 days. EXAM: CT ABDOMEN AND PELVIS WITH CONTRAST TECHNIQUE: Multidetector CT imaging of the abdomen and pelvis was performed using the standard protocol following bolus administration of intravenous contrast. CONTRAST:  137mL ISOVUE-300 IOPAMIDOL (ISOVUE-300) INJECTION 61% COMPARISON:  05/22/2015 and prior CTs FINDINGS: Lower chest: No acute abnormality Hepatobiliary: The liver is unremarkable. The patient is status post cholecystectomy. Mild intrahepatic biliary fullness again noted. Pancreas: Unremarkable Spleen: Unremarkable Adrenals/Urinary Tract: The kidneys, adrenal glands and bladder are unremarkable except for small right renal cysts. Stomach/Bowel: Stomach is within normal limits. Appendix appears normal. No evidence of bowel wall thickening, distention, or inflammatory changes. Vascular/Lymphatic: Aortic atherosclerotic calcifications noted without aneurysm. No enlarged lymph nodes. Reproductive: Prostate mildly enlarged. Other: No free fluid, abscess or pneumoperitoneum. Musculoskeletal: Surgical changes within the lower lumbar spine again noted. No acute abnormality. IMPRESSION: No evidence of acute abnormality. Abdominal aortic atherosclerosis. Electronically Signed   By: Margarette Canada M.D.   On: 03/27/2016 13:29    ____________________________________________   PROCEDURES  Procedure(s) performed: None  Procedures  Critical Care performed: No  ____________________________________________   INITIAL IMPRESSION / ASSESSMENT AND PLAN / ED COURSE  Pertinent labs & imaging results that were available during my care of the patient were reviewed by me and considered in my medical decision making (see chart for details).  Differential diagnosis includes but is not limited to, abdominal  perforation, aortic dissection, cholecystitis, appendicitis, diverticulitis, colitis, esophagitis/gastritis, kidney stone, pyelonephritis, urinary tract infection, aortic aneurysm. All are considered in decision and treatment plan. Based upon the patient's presentation and risk factors, I'm concerned about possible recurrent pancreatitis or potential gastric/esophageal disease given his current clinical workups and plan for endoscopy.  ----------------------------------------- 2:15 PM on 03/27/2016 -----------------------------------------  Patient feels improved. He is currently resting comfortably. Nausea improved. Fully awake and alert. Friend will be driving him home. He will plan to follow up closely with gastroenterology, he is currently awaiting plan for upper endoscopy at this time. Continue his current therapy, plus add Zofran which she reports he has run out of and would like another prescription for.  No acute cardiac or pulmonary symptoms. Reassuring CT scan. Stable normal hemodynamics.  Return precautions and treatment recommendations and follow-up discussed with the patient who is agreeable with  the plan.        ____________________________________________   FINAL CLINICAL IMPRESSION(S) / ED DIAGNOSES  Final diagnoses:  Epigastric abdominal pain      NEW MEDICATIONS STARTED DURING THIS VISIT:  New Prescriptions   ONDANSETRON (ZOFRAN ODT) 4 MG DISINTEGRATING TABLET    Take 1 tablet (4 mg total) by mouth every 6 (six) hours as needed for nausea or vomiting.     Note:  This document was prepared using Dragon voice recognition software and may include unintentional dictation errors.     Delman Kitten, MD 03/27/16 [phone]

## 2016-03-27 NOTE — ED Notes (Signed)

## 2016-03-27 NOTE — Discharge Instructions (Signed)
? ?  Please return to the emergency room right away if you are to develop a fever, severe nausea, your pain becomes severe or worsens, you are unable to keep food down, begin vomiting any dark or bloody fluid, you develop any dark or bloody stools, feel dehydrated, or other new concerns or symptoms arise. ? ?

## 2016-03-29 ENCOUNTER — Encounter: Payer: Self-pay | Admitting: *Deleted

## 2016-03-30 ENCOUNTER — Ambulatory Visit: Payer: Medicare HMO | Admitting: Anesthesiology

## 2016-03-30 ENCOUNTER — Encounter
Admission: RE | Disposition: A | Discharge status: Home / Self Care | Payer: Self-pay | Source: Ambulatory Visit | Attending: Unknown Physician Specialty

## 2016-03-30 ENCOUNTER — Ambulatory Visit
Admission: RE | Admit: 2016-03-30 | Discharge: 2016-03-30 | Disposition: A | Discharge status: Home / Self Care | Payer: Medicare HMO | Source: Ambulatory Visit | Attending: Unknown Physician Specialty | Admitting: Unknown Physician Specialty

## 2016-03-30 ENCOUNTER — Encounter: Payer: Self-pay | Admitting: *Deleted

## 2016-03-30 DIAGNOSIS — R1013 Epigastric pain: Secondary | ICD-10-CM | POA: Diagnosis present

## 2016-03-30 DIAGNOSIS — K210 Gastro-esophageal reflux disease with esophagitis: Secondary | ICD-10-CM | POA: Diagnosis not present

## 2016-03-30 DIAGNOSIS — K2900 Acute gastritis without bleeding: Secondary | ICD-10-CM | POA: Diagnosis not present

## 2016-03-30 DIAGNOSIS — Z8249 Family history of ischemic heart disease and other diseases of the circulatory system: Secondary | ICD-10-CM | POA: Diagnosis not present

## 2016-03-30 DIAGNOSIS — Z79899 Other long term (current) drug therapy: Secondary | ICD-10-CM | POA: Insufficient documentation

## 2016-03-30 DIAGNOSIS — Z882 Allergy status to sulfonamides status: Secondary | ICD-10-CM | POA: Diagnosis not present

## 2016-03-30 DIAGNOSIS — K2950 Unspecified chronic gastritis without bleeding: Secondary | ICD-10-CM | POA: Diagnosis not present

## 2016-03-30 DIAGNOSIS — K319 Disease of stomach and duodenum, unspecified: Secondary | ICD-10-CM | POA: Diagnosis not present

## 2016-03-30 DIAGNOSIS — K209 Esophagitis, unspecified: Secondary | ICD-10-CM | POA: Diagnosis not present

## 2016-03-30 DIAGNOSIS — Z6830 Body mass index (BMI) 30.0-30.9, adult: Secondary | ICD-10-CM | POA: Insufficient documentation

## 2016-03-30 DIAGNOSIS — I2510 Atherosclerotic heart disease of native coronary artery without angina pectoris: Secondary | ICD-10-CM | POA: Insufficient documentation

## 2016-03-30 DIAGNOSIS — Z9049 Acquired absence of other specified parts of digestive tract: Secondary | ICD-10-CM | POA: Diagnosis not present

## 2016-03-30 DIAGNOSIS — I10 Essential (primary) hypertension: Secondary | ICD-10-CM | POA: Diagnosis not present

## 2016-03-30 DIAGNOSIS — E7800 Pure hypercholesterolemia, unspecified: Secondary | ICD-10-CM | POA: Diagnosis not present

## 2016-03-30 DIAGNOSIS — M549 Dorsalgia, unspecified: Secondary | ICD-10-CM | POA: Diagnosis not present

## 2016-03-30 DIAGNOSIS — Z800 Family history of malignant neoplasm of digestive organs: Secondary | ICD-10-CM | POA: Diagnosis not present

## 2016-03-30 DIAGNOSIS — F068 Other specified mental disorders due to known physiological condition: Secondary | ICD-10-CM | POA: Insufficient documentation

## 2016-03-30 DIAGNOSIS — F329 Major depressive disorder, single episode, unspecified: Secondary | ICD-10-CM | POA: Insufficient documentation

## 2016-03-30 DIAGNOSIS — E669 Obesity, unspecified: Secondary | ICD-10-CM | POA: Diagnosis not present

## 2016-03-30 DIAGNOSIS — G8929 Other chronic pain: Secondary | ICD-10-CM | POA: Insufficient documentation

## 2016-03-30 DIAGNOSIS — R69 Illness, unspecified: Secondary | ICD-10-CM | POA: Diagnosis not present

## 2016-03-30 DIAGNOSIS — K449 Diaphragmatic hernia without obstruction or gangrene: Secondary | ICD-10-CM | POA: Insufficient documentation

## 2016-03-30 DIAGNOSIS — K2960 Other gastritis without bleeding: Secondary | ICD-10-CM | POA: Diagnosis not present

## 2016-03-30 DIAGNOSIS — Z880 Allergy status to penicillin: Secondary | ICD-10-CM | POA: Insufficient documentation

## 2016-03-30 DIAGNOSIS — Z9660 Presence of unspecified orthopedic joint implant: Secondary | ICD-10-CM | POA: Insufficient documentation

## 2016-03-30 HISTORY — PX: ESOPHAGOGASTRODUODENOSCOPY (EGD) WITH PROPOFOL: SHX5813

## 2016-03-30 SURGERY — ESOPHAGOGASTRODUODENOSCOPY (EGD) WITH PROPOFOL
Anesthesia: General

## 2016-03-30 MED ORDER — SODIUM CHLORIDE 0.9 % IV SOLN: INTRAVENOUS | Status: DC

## 2016-03-30 MED ORDER — SODIUM CHLORIDE 0.9 % IV SOLN
INTRAVENOUS | Status: DC
Start: 2016-03-30 — End: 2016-03-30
  Administered 2016-03-30 (×3): via INTRAVENOUS

## 2016-03-30 MED ORDER — PROPOFOL 10 MG/ML IV BOLUS
INTRAVENOUS | Status: DC | PRN
  Administered 2016-03-30: 180 mg via INTRAVENOUS

## 2016-03-30 MED ORDER — ONDANSETRON HCL 4 MG/2ML IJ SOLN
INTRAMUSCULAR | Status: AC
  Administered 2016-03-30: 4 mg via INTRAVENOUS
  Filled 2016-03-30: qty 2

## 2016-03-30 MED ORDER — PROPOFOL 10 MG/ML IV BOLUS
INTRAVENOUS | Status: AC
  Filled 2016-03-30: qty 20

## 2016-03-30 MED ORDER — PROPOFOL 500 MG/50ML IV EMUL
INTRAVENOUS | Status: DC | PRN
  Administered 2016-03-30: 200 ug/kg/min via INTRAVENOUS

## 2016-03-30 MED ORDER — ONDANSETRON HCL 4 MG/2ML IJ SOLN
4.0000 mg | Freq: Once | INTRAMUSCULAR | Status: AC
  Administered 2016-03-30: 4 mg via INTRAVENOUS

## 2016-03-30 MED ORDER — SODIUM CHLORIDE 0.9 % IJ SOLN
INTRAMUSCULAR | Status: AC
Start: 2016-03-30 — End: 2016-03-30
  Filled 2016-03-30: qty 10

## 2016-03-30 MED ORDER — PROPOFOL 500 MG/50ML IV EMUL
INTRAVENOUS | Status: AC
  Filled 2016-03-30: qty 50

## 2016-03-30 MED ORDER — EPHEDRINE SULFATE 50 MG/ML IJ SOLN
INTRAMUSCULAR | Status: AC
  Filled 2016-03-30: qty 1

## 2016-03-30 NOTE — Anesthesia Post-op Follow-up Note (Cosign Needed)
Anesthesia QCDR form completed.        

## 2016-03-30 NOTE — Anesthesia Preprocedure Evaluation (Signed)
Anesthesia Evaluation  Patient identified by MRN, date of birth, ID band Patient awake    Reviewed: Allergy & Precautions, NPO status , Patient's Chart, lab work & pertinent test results  History of Anesthesia Complications Negative for: history of anesthetic complications  Airway Mallampati: II  TM Distance: >3 FB Neck ROM: Full    Dental no notable dental hx.    Pulmonary neg pulmonary ROS, neg sleep apnea, neg COPD,    breath sounds clear to auscultation- rhonchi (-) wheezing      Cardiovascular hypertension, (-) CAD and (-) Past MI  Rhythm:Regular Rate:Normal - Systolic murmurs and - Diastolic murmurs    Neuro/Psych PSYCHIATRIC DISORDERS Depression negative neurological ROS     GI/Hepatic negative GI ROS, Neg liver ROS,   Endo/Other  negative endocrine ROSneg diabetes  Renal/GU negative Renal ROS     Musculoskeletal negative musculoskeletal ROS (+)   Abdominal (+) + obese,   Peds  Hematology  (+) anemia ,   Anesthesia Other Findings Past Medical History: No date: Allergy No date: Anoxic brain injury ([place]) No date: Chronic back pain     Comment: s/p 5 back surgeries No date: Coronary artery disease     Comment: Cardiac catheterization in 2007 at [facility] showed               30% stenosis in proximal LAD. No other disease.              Ejection fraction was 65% No date: Depression No date: H/O acute pancreatitis No date: Hypercholesterolemia No date: Hypertension No date: Mild cognitive impairment with memory loss No date: Pancreatitis   Reproductive/Obstetrics                             Anesthesia Physical Anesthesia Plan  ASA: II  Anesthesia Plan: General   Post-op Pain Management:    Induction: Intravenous  Airway Management Planned: Natural Airway  Additional Equipment:   Intra-op Plan:   Post-operative Plan:   Informed Consent: I have reviewed the patients  History and Physical, chart, labs and discussed the procedure including the risks, benefits and alternatives for the proposed anesthesia with the patient or authorized representative who has indicated his/her understanding and acceptance.   Dental advisory given  Plan Discussed with: CRNA and Anesthesiologist  Anesthesia Plan Comments:         Anesthesia Quick Evaluation

## 2016-03-30 NOTE — Anesthesia Postprocedure Evaluation (Signed)
Anesthesia Post Note  Patient: [name]  Procedure(s) Performed: Procedure(s) (LRB): ESOPHAGOGASTRODUODENOSCOPY (EGD) WITH PROPOFOL (N/A)  Patient location during evaluation: PACU Anesthesia Type: General Level of consciousness: awake and alert Pain management: pain level controlled Vital Signs Assessment: post-procedure vital signs reviewed and stable Respiratory status: spontaneous breathing, nonlabored ventilation, respiratory function stable and patient connected to nasal cannula oxygen Cardiovascular status: blood pressure returned to baseline and stable Postop Assessment: no signs of nausea or vomiting Anesthetic complications: no     Last Vitals:  Vitals:   03/30/16 1637 03/30/16 1647  BP: 114/85 122/81  Pulse: 78 67  Resp: 16 18  Temp:      Last Pain:  Vitals:   03/30/16 1627  TempSrc: Tympanic  PainSc:                  Molli Barrows

## 2016-03-30 NOTE — H&P (Signed)
Primary Care Physician:  Einar Pheasant, MD Primary Gastroenterologist:  Dr. Vira Agar  Pre-Procedure History & Physical: HPI:  [name] is a 56 y.o. male is here for an endoscopy.   Past Medical History:  Diagnosis Date  . Allergy   . Anoxic brain injury ([place])   . Chronic back pain    s/p 5 back surgeries  . Coronary artery disease    Cardiac catheterization in 2007 at [facility] showed 30% stenosis in proximal LAD. No other disease. Ejection fraction was 65%  . Depression   . H/O acute pancreatitis   . Hypercholesterolemia   . Hypertension   . Mild cognitive impairment with memory loss   . Pancreatitis     Past Surgical History:  Procedure Laterality Date  . abdomial surgery    . BACK SURGERY     multiple back surgeries (5)  . CARDIAC CATHETERIZATION     [facility]   . CHOLECYSTECTOMY  2013  . COLONOSCOPY WITH PROPOFOL N/A 06/30/2015   Procedure: COLONOSCOPY WITH PROPOFOL;  Surgeon: Lollie Sails, MD;  Location: [facility] ENDOSCOPY;  Service: Endoscopy;  Laterality: N/A;  . FUNCTIONAL ENDOSCOPIC SINUS SURGERY    . JOINT REPLACEMENT    . LAMINECTOMY    . LUMBAR SPINE SURGERY    . SHOULDER ARTHROSCOPY WITH ROTATOR CUFF REPAIR AND OPEN BICEPS TENODESIS Right   . SHOULDER ARTHROSCOPY WITH ROTATOR CUFF REPAIR AND OPEN BICEPS TENODESIS    . SPINE SURGERY    . TRIGGER POINT INJECTION    . VASECTOMY     With Reversal  . VEIN SURGERY Right 2013   arm    Prior to Admission medications   Medication Sig Start Date End Date Taking? Authorizing Provider  amphetamine-dextroamphetamine (ADDERALL) 20 MG tablet Take 20 mg by mouth 3 (three) times daily.   Yes Historical Provider, MD  Ascorbic Acid (VITAMIN C) 1000 MG tablet Take 1,000 mg by mouth daily.   Yes Historical Provider, MD  cetirizine (ZYRTEC) 10 MG tablet Take 10 mg by mouth daily.   Yes Historical Provider, MD  cholecalciferol (VITAMIN D) 1000 units tablet Take 1,000 Units by mouth daily.   Yes Historical Provider, MD   fluticasone (FLONASE) 50 MCG/ACT nasal spray PLACE 2 SPRAYS INTO BOTH NOSTRILS DAILY. 11/24/15  Yes Einar Pheasant, MD  lipase/protease/amylase (CREON) 12000 units CPEP capsule Take 24,000 Units by mouth 3 (three) times daily with meals. Reported on 07/06/2015   Yes Historical Provider, MD  Omega-3 Fatty Acids (FISH OIL) 500 MG CAPS Take by mouth daily. Pt doesn't how may MG he's taking.   Yes Historical Provider, MD  ondansetron (ZOFRAN ODT) 4 MG disintegrating tablet Take 1 tablet (4 mg total) by mouth every 6 (six) hours as needed for nausea or vomiting. 03/27/16  Yes Delman Kitten, MD  amLODipine (NORVASC) 5 MG tablet Take by mouth. 04/02/15 04/01/16  Historical Provider, MD  naproxen (NAPROSYN) 500 MG tablet Take by mouth. Reported on 07/06/2015 04/09/15   Historical Provider, MD  tiZANidine (ZANAFLEX) 4 MG tablet Take 4 mg by mouth 3 (three) times daily.    Historical Provider, MD  triamcinolone cream (KENALOG) 0.1 % Apply 1 application topically 2 (two) times daily. Apply to affected area bid (on neck) Patient not taking: Reported on 03/27/2016 10/06/15   Einar Pheasant, MD    Allergies as of 03/29/2016 - Review Complete 03/29/2016  Allergen Reaction Noted  . Penicillins Anaphylaxis 11/30/2011  . Sulfa antibiotics  05/01/2012    Family History  Problem Relation Age of Onset  . Heart disease Father   . Heart attack Father   . Heart disease Mother   . Heart attack Mother   . Heart attack Brother     MI's x 2   . Hypertension    . Colon cancer      Social History   Social History  . Marital status: Married    Spouse name: N/A  . Number of children: 3  . Years of education: N/A   Occupational History  . Not on file.   Social History Main Topics  . Smoking status: Never Smoker  . Smokeless tobacco: Never Used  . Alcohol use No  . Drug use: No  . Sexual activity: Not on file   Other Topics Concern  . Not on file   Social History Narrative  . No narrative on file    Review of  Systems: See HPI, otherwise negative ROS  Physical Exam: BP 129/86   Pulse 76   Temp 97.4 F (36.3 C) (Tympanic)   Resp 20   Ht 6\' 1"  (1.854 m)   Wt 105.7 kg (233 lb)   SpO2 100%   BMI 30.74 kg/m  General:   Alert,  pleasant and cooperative in NAD Head:  Normocephalic and atraumatic. Neck:  Supple; no masses or thyromegaly. Lungs:  Clear throughout to auscultation.    Heart:  Regular rate and rhythm. Abdomen:  Soft, nontender and nondistended. Normal bowel sounds, without guarding, and without rebound.   Neurologic:  Alert and  oriented x4;  grossly normal neurologically.  Impression/Plan: [name] is here for an endoscopy to be performed for epigastric abdominal pain  Risks, benefits, limitations, and alternatives regarding  endoscopy have been reviewed with the patient.  Questions have been answered.  All parties agreeable.   Gaylyn Cheers, MD  03/30/2016, 4:08 PM

## 2016-03-30 NOTE — Op Note (Addendum)
[facility] Patient Name: [name] Procedure Date: 03/30/2016 3:53 PM MRN: [id number] Account #: [id number] Date of Birth: May 06, 1960 Admit Type: Outpatient Age: 56 Room: [facility] ENDO ROOM 4 Gender: Male Note Status: Finalized Procedure:            Upper GI endoscopy Indications:          Epigastric abdominal pain, Abdominal pain in the left                        upper quadrant Providers:            Manya Silvas, MD Referring MD:         Einar Pheasant, MD (Referring MD) Medicines:            Propofol per Anesthesia Complications:        No immediate complications. Procedure:            Pre-Anesthesia Assessment:                       - After reviewing the risks and benefits, the patient                        was deemed in satisfactory condition to undergo the                        procedure.                       After obtaining informed consent, the endoscope was                        passed under direct vision. Throughout the procedure,                        the patient's blood pressure, pulse, and oxygen                        saturations were monitored continuously. The                        Colonoscope was introduced through the mouth, and                        advanced to the second part of duodenum. The upper GI                        endoscopy was accomplished without difficulty. The                        patient tolerated the procedure well. Findings:      LA Grade A (one or more mucosal breaks less than 5 mm, not extending       between tops of 2 mucosal folds) esophagitis with no bleeding was found       40 cm from the incisors. A biopsy was taken with a cold forceps for       histology.      A few dispersed, small non-bleeding erosions were found on the greater       curvature of the gastric body. There were no stigmata of recent       bleeding. Biopsies were taken with a  cold forceps for histology.       Biopsies  were taken with a cold forceps for Helicobacter pylori testing.      The examined duodenum was normal.      A small hiatal hernia was present. Impression:           - LA Grade A reflux esophagitis. Rule out Barrett's                        esophagus. Biopsied.                       - Non-bleeding erosive gastropathy. Biopsied.                       - Normal examined duodenum.                       - Small hiatal hernia. Recommendation:       - Await pathology results. Continue meds. Manya Silvas, MD 03/30/2016 4:29:28 PM This report has been signed electronically. Number of Addenda: 0 Note Initiated On: 03/30/2016 3:53 PM      [facility]

## 2016-03-30 NOTE — Transfer of Care (Signed)
Immediate Anesthesia Transfer of Care Note  Patient: [name]  Procedure(s) Performed: Procedure(s): ESOPHAGOGASTRODUODENOSCOPY (EGD) WITH PROPOFOL (N/A)  Patient Location: PACU  Anesthesia Type:General  Level of Consciousness: sedated  Airway & Oxygen Therapy: Patient Spontanous Breathing and Patient connected to nasal cannula oxygen  Post-op Assessment: Report given to RN and Post -op Vital signs reviewed and stable  Post vital signs: Reviewed and stable  Last Vitals:  Vitals:   03/30/16 1444  BP: 129/86  Pulse: 76  Resp: 20  Temp: 36.3 C    Last Pain:  Vitals:   03/30/16 1444  TempSrc: Tympanic  PainSc: 9       Patients Stated Pain Goal: 2 ([id number])  Complications: No apparent anesthesia complications

## 2016-03-31 ENCOUNTER — Encounter: Payer: Self-pay | Admitting: Unknown Physician Specialty

## 2016-04-01 LAB — SURGICAL PATHOLOGY

## 2016-04-04 ENCOUNTER — Encounter: Payer: Self-pay | Admitting: Internal Medicine

## 2016-04-04 DIAGNOSIS — R000 Tachycardia, unspecified: Secondary | ICD-10-CM | POA: Diagnosis not present

## 2016-04-04 DIAGNOSIS — N529 Male erectile dysfunction, unspecified: Secondary | ICD-10-CM

## 2016-04-04 DIAGNOSIS — Z87438 Personal history of other diseases of male genital organs: Secondary | ICD-10-CM

## 2016-04-04 DIAGNOSIS — I10 Essential (primary) hypertension: Secondary | ICD-10-CM | POA: Diagnosis not present

## 2016-04-04 DIAGNOSIS — R002 Palpitations: Secondary | ICD-10-CM | POA: Diagnosis not present

## 2016-04-04 DIAGNOSIS — R079 Chest pain, unspecified: Secondary | ICD-10-CM | POA: Diagnosis not present

## 2016-04-06 DIAGNOSIS — G8929 Other chronic pain: Secondary | ICD-10-CM | POA: Diagnosis not present

## 2016-04-06 DIAGNOSIS — M5030 Other cervical disc degeneration, unspecified cervical region: Secondary | ICD-10-CM | POA: Diagnosis not present

## 2016-04-06 DIAGNOSIS — M961 Postlaminectomy syndrome, not elsewhere classified: Secondary | ICD-10-CM | POA: Diagnosis not present

## 2016-04-06 DIAGNOSIS — M791 Myalgia: Secondary | ICD-10-CM | POA: Diagnosis not present

## 2016-04-06 DIAGNOSIS — M25511 Pain in right shoulder: Secondary | ICD-10-CM | POA: Diagnosis not present

## 2016-04-06 DIAGNOSIS — G479 Sleep disorder, unspecified: Secondary | ICD-10-CM | POA: Diagnosis not present

## 2016-04-06 DIAGNOSIS — M5136 Other intervertebral disc degeneration, lumbar region: Secondary | ICD-10-CM | POA: Diagnosis not present

## 2016-04-06 DIAGNOSIS — R202 Paresthesia of skin: Secondary | ICD-10-CM | POA: Diagnosis not present

## 2016-04-07 NOTE — Telephone Encounter (Signed)
Order placed for urology referral.  

## 2016-04-14 ENCOUNTER — Ambulatory Visit (INDEPENDENT_AMBULATORY_CARE_PROVIDER_SITE_OTHER): Payer: Medicare HMO

## 2016-04-14 VITALS — BP 138/88 | HR 88 | Temp 97.9°F | Resp 12 | Ht 72.0 in | Wt 232.1 lb

## 2016-04-14 DIAGNOSIS — Z0000 Encounter for general adult medical examination without abnormal findings: Secondary | ICD-10-CM

## 2016-04-14 NOTE — Patient Instructions (Addendum)
[name] , Thank you for taking time to come for your Medicare Wellness Visit. I appreciate your ongoing commitment to your health goals. Please review the following plan we discussed and let me know if I can assist you in the future.   Follow up with Dr. Nicki Reaper as needed.    Have a great day!  These are the goals we discussed: Goals    . Increase physical activity          Swim more for exercise       This is a list of the screening recommended for you and due dates:  Health Maintenance  Topic Date Due  . HIV Screening  08/21/1977  . Tetanus Vaccine  06/11/2024  . Colon Cancer Screening  06/29/2025  . Flu Shot  Completed  .  Hepatitis C: One time screening is recommended by Center for Disease Control  (CDC) for  adults born from [birth year] through 1963.   Completed    Fall Prevention in the Home Falls can cause injuries. They can happen to people of all ages. There are many things you can do to make your home safe and to help prevent falls. What can I do on the outside of my home?  Regularly fix the edges of walkways and driveways and fix any cracks.  Remove anything that might make you trip as you walk through a door, such as a raised step or threshold.  Trim any bushes or trees on the path to your home.  Use bright outdoor lighting.  Clear any walking paths of anything that might make someone trip, such as rocks or tools.  Regularly check to see if handrails are loose or broken. Make sure that both sides of any steps have handrails.  Any raised decks and porches should have guardrails on the edges.  Have any leaves, snow, or ice cleared regularly.  Use sand or salt on walking paths during winter.  Clean up any spills in your garage right away. This includes oil or grease spills. What can I do in the bathroom?  Use night lights.  Install grab bars by the toilet and in the tub and shower. Do not use towel bars as grab bars.  Use non-skid mats or decals in the tub  or shower.  If you need to sit down in the shower, use a plastic, non-slip stool.  Keep the floor dry. Clean up any water that spills on the floor as soon as it happens.  Remove soap buildup in the tub or shower regularly.  Attach bath mats securely with double-sided non-slip rug tape.  Do not have throw rugs and other things on the floor that can make you trip. What can I do in the bedroom?  Use night lights.  Make sure that you have a light by your bed that is easy to reach.  Do not use any sheets or blankets that are too big for your bed. They should not hang down onto the floor.  Have a firm chair that has side arms. You can use this for support while you get dressed.  Do not have throw rugs and other things on the floor that can make you trip. What can I do in the kitchen?  Clean up any spills right away.  Avoid walking on wet floors.  Keep items that you use a lot in easy-to-reach places.  If you need to reach something above you, use a strong step stool that has a grab bar.  Keep electrical cords out of the way.  Do not use floor polish or wax that makes floors slippery. If you must use wax, use non-skid floor wax.  Do not have throw rugs and other things on the floor that can make you trip. What can I do with my stairs?  Do not leave any items on the stairs.  Make sure that there are handrails on both sides of the stairs and use them. Fix handrails that are broken or loose. Make sure that handrails are as long as the stairways.  Check any carpeting to make sure that it is firmly attached to the stairs. Fix any carpet that is loose or worn.  Avoid having throw rugs at the top or bottom of the stairs. If you do have throw rugs, attach them to the floor with carpet tape.  Make sure that you have a light switch at the top of the stairs and the bottom of the stairs. If you do not have them, ask someone to add them for you. What else can I do to help prevent  falls?  Wear shoes that:  Do not have high heels.  Have rubber bottoms.  Are comfortable and fit you well.  Are closed at the toe. Do not wear sandals.  If you use a stepladder:  Make sure that it is fully opened. Do not climb a closed stepladder.  Make sure that both sides of the stepladder are locked into place.  Ask someone to hold it for you, if possible.  Clearly mark and make sure that you can see:  Any grab bars or handrails.  First and last steps.  Where the edge of each step is.  Use tools that help you move around (mobility aids) if they are needed. These include:  Canes.  Walkers.  Scooters.  Crutches.  Turn on the lights when you go into a dark area. Replace any light bulbs as soon as they burn out.  Set up your furniture so you have a clear path. Avoid moving your furniture around.  If any of your floors are uneven, fix them.  If there are any pets around you, be aware of where they are.  Review your medicines with your doctor. Some medicines can make you feel dizzy. This can increase your chance of falling. Ask your doctor what other things that you can do to help prevent falls. This information is not intended to replace advice given to you by your health care provider. Make sure you discuss any questions you have with your health care provider. Document Released: 11/13/2008 Document Revised: 06/25/2015 Document Reviewed: 02/21/2014 Elsevier Interactive Patient Education  2017 [employer].

## 2016-04-14 NOTE — Progress Notes (Signed)
Subjective:   [name] is a 56 y.o. male who presents for an Initial Medicare Annual Wellness Visit.  Review of Systems  No ROS.  Medicare Wellness Visit.  Cardiac Risk Factors include: advanced age (>77men, >40 women);hypertension;male gender    Objective:    Today's Vitals   04/14/16 0946 04/14/16 0947  BP:  138/88  Pulse:  88  Resp: 12 12  Temp:  97.9 F (36.6 C)  TempSrc:  Oral  SpO2:  96%  Weight: 232 lb 1.9 oz (105.3 kg) 232 lb 1.9 oz (105.3 kg)  Height: 6\' 1"  (1.854 m) 6' (1.829 m)  PainSc:  8    Body mass index is 31.48 kg/m.  Current Medications (verified) Outpatient Encounter Prescriptions as of 04/14/2016  Medication Sig  . amphetamine-dextroamphetamine (ADDERALL) 20 MG tablet Take 20 mg by mouth 3 (three) times daily.  . Ascorbic Acid (VITAMIN C) 1000 MG tablet Take 1,000 mg by mouth daily.  . cetirizine (ZYRTEC) 10 MG tablet Take 10 mg by mouth daily.  . cholecalciferol (VITAMIN D) 1000 units tablet Take 1,000 Units by mouth daily.  . fluticasone (FLONASE) 50 MCG/ACT nasal spray PLACE 2 SPRAYS INTO BOTH NOSTRILS DAILY.  Marland Kitchen lipase/protease/amylase (CREON) 12000 units CPEP capsule Take 24,000 Units by mouth 3 (three) times daily with meals. Reported on 07/06/2015  . naproxen (NAPROSYN) 500 MG tablet Take by mouth. Reported on 07/06/2015  . Omega-3 Fatty Acids (FISH OIL) 500 MG CAPS Take by mouth daily. Pt doesn't how may MG he's taking.  . ondansetron (ZOFRAN ODT) 4 MG disintegrating tablet Take 1 tablet (4 mg total) by mouth every 6 (six) hours as needed for nausea or vomiting.  . pantoprazole (PROTONIX) 40 MG tablet Take by mouth.  . sucralfate (CARAFATE) 1 g tablet Take by mouth.  Marland Kitchen tiZANidine (ZANAFLEX) 4 MG tablet Take 4 mg by mouth 3 (three) times daily.  . [DISCONTINUED] triamcinolone cream (KENALOG) 0.1 % Apply 1 application topically 2 (two) times daily. Apply to affected area bid (on neck)  . amLODipine (NORVASC) 5 MG tablet Take by mouth.  .  diazepam (VALIUM) 5 MG tablet TAKE 1-2 TABS BY MOUTH TWICE DAILY  . sildenafil (REVATIO) 20 MG tablet TAKE 4-5 TABLETS BY MOUTH DAILY AS NEEDED   No facility-administered encounter medications on file as of 04/14/2016.     Allergies (verified) Penicillins and Sulfa antibiotics   History: Past Medical History:  Diagnosis Date  . Allergy   . Anoxic brain injury ([place])   . Chronic back pain    s/p 5 back surgeries  . Coronary artery disease    Cardiac catheterization in 2007 at [facility] showed 30% stenosis in proximal LAD. No other disease. Ejection fraction was 65%  . Depression   . H/O acute pancreatitis   . Hypercholesterolemia   . Hypertension   . Mild cognitive impairment with memory loss   . Pancreatitis    Past Surgical History:  Procedure Laterality Date  . abdomial surgery    . BACK SURGERY     multiple back surgeries (5)  . CARDIAC CATHETERIZATION     [facility]   . CHOLECYSTECTOMY  2013  . COLONOSCOPY WITH PROPOFOL N/A 06/30/2015   Procedure: COLONOSCOPY WITH PROPOFOL;  Surgeon: Lollie Sails, MD;  Location: [facility] ENDOSCOPY;  Service: Endoscopy;  Laterality: N/A;  . ESOPHAGOGASTRODUODENOSCOPY (EGD) WITH PROPOFOL N/A 03/30/2016   Procedure: ESOPHAGOGASTRODUODENOSCOPY (EGD) WITH PROPOFOL;  Surgeon: Manya Silvas, MD;  Location: [facility] ENDOSCOPY;  Service: Endoscopy;  Laterality: N/A;  . FUNCTIONAL ENDOSCOPIC SINUS SURGERY    . JOINT REPLACEMENT    . LAMINECTOMY    . LUMBAR SPINE SURGERY    . SHOULDER ARTHROSCOPY WITH ROTATOR CUFF REPAIR AND OPEN BICEPS TENODESIS Right   . SHOULDER ARTHROSCOPY WITH ROTATOR CUFF REPAIR AND OPEN BICEPS TENODESIS    . SPINE SURGERY    . TRIGGER POINT INJECTION    . VASECTOMY     With Reversal  . VEIN SURGERY Right 2013   arm   Family History  Problem Relation Age of Onset  . Heart disease Father   . Heart attack Father   . Heart disease Mother   . Heart attack Mother   . Heart attack Brother     MI's x 2   . Hypertension    . Colon  cancer     Social History   Occupational History  . Not on file.   Social History Main Topics  . Smoking status: Never Smoker  . Smokeless tobacco: Never Used  . Alcohol use No  . Drug use: No  . Sexual activity: Yes   Tobacco Counseling Counseling given: Not Answered   Activities of Daily Living In your present state of health, do you have any difficulty performing the following activities: 04/14/2016  Hearing? N  Vision? N  Difficulty concentrating or making decisions? Y  Walking or climbing stairs? Y  Dressing or bathing? N  Doing errands, shopping? N  Preparing Food and eating ? N  Using the Toilet? N  In the past six months, have you accidently leaked urine? N  Do you have problems with loss of bowel control? N  Managing your Medications? N  Managing your Finances? N  Housekeeping or managing your Housekeeping? N  Some recent data might be hidden    Immunizations and Health Maintenance Immunization History  Administered Date(s) Administered  . Influenza,inj,Quad PF,36+ Mos 10/15/2014, 10/06/2015  . Influenza-Unspecified 11/09/2012, 11/14/2013  . Tdap 06/12/2014   Health Maintenance Due  Topic Date Due  . HIV Screening  08/21/1977    Patient Care Team: Einar Pheasant, MD as PCP - General (Internal Medicine) Robert Bellow, MD (General Surgery) Crecencio Mc, MD (Internal Medicine)  Indicate any recent Medical Services you may have received from other than [facility] providers in the past year (date may be approximate).    Assessment:   This is a routine wellness examination for [name].  The goal of the wellness visit is to assist the patient how to close the gaps in care and create a preventative care plan for the patient.   Taking calcium VIT D as appropriate/Osteoporosis risk reviewed.  Medications reviewed; taking without issues or barriers.  Safety issues reviewed; alarm system with smoke and carbon monoxide detectors in the home. Firearms  locked in a safe within the home. Wears seatbelts when driving or riding with others. Patient does wear sunscreen or protective clothing when in direct sunlight. No violence in the home.  Patient is alert, normal appearance, oriented to person/place/and time. Correctly identified the president of the [place], recall of 3/3 words, and performing simple calculations.  Patient displays appropriate judgement and can read correct time from watch face.  No new identified risk were noted.  No failures at ADL's or IADL's.   BMI- discussed the importance of a healthy diet, water intake and exercise. Educational material provided.   Diet: Breakfast: 2 slices wheat toast Lunch: Protein shake Dinner: Seafood, vegetables Daily fluid intake: 0  cups of caffeine, 4 cups of apple juice/ginger ale  HTN- followed by PCP.  Dental- every six months.  Eye- Visual acuity not assessed per patient preference since they have regular follow up with the ophthalmologist.  Wears corrective lenses.  Sleep patterns- Sleeps 4-5 hours at night.  Wakes feeling tired.  HIV screening discussed; educational material provided.  Patient Concerns: None at this time. Follow up with PCP as needed.  Hearing/Vision screen Hearing Screening Comments: Patient is able to hear conversational tones without difficulty.  No issues reported.   Vision Screening Comments: Wears corrective lenses Last OV 02/2016 Visual acuity not assessed per patient preference since they have regular follow up with the ophthalmologist  Dietary issues and exercise activities discussed: Current Exercise Habits: Structured exercise class, Type of exercise: walking (Swimming), Time (Minutes): 20, Frequency (Times/Week): 4, Weekly Exercise (Minutes/Week): 80, Intensity: Mild  Goals    . Increase physical activity          Swim more for exercise      Depression Screen PHQ 2/9 Scores 04/14/2016 10/29/2015 02/24/2015  PHQ - 2 Score 0 0 0    Fall  Risk Fall Risk  04/14/2016 10/29/2015 02/24/2015  Falls in the past year? Yes No Yes  Number falls in past yr: 2 or more - 2 or more  Injury with Fall? No - Yes  Risk Factor Category  High Fall Risk - -  Risk for fall due to : History of fall(s) - -  Risk for fall due to (comments): Chronic back pain, L knee pain - -  Follow up Falls prevention discussed;Education provided - -    Cognitive Function: MMSE - Mini Mental State Exam 04/14/2016  Orientation to time 5  Orientation to Place 5  Registration 3  Attention/ Calculation 5  Recall 3  Language- name 2 objects 2  Language- repeat 1  Language- follow 3 step command 3  Language- read & follow direction 1  Write a sentence 1  Copy design 1  Total score 30        Screening Tests Health Maintenance  Topic Date Due  . HIV Screening  08/21/1977  . TETANUS/TDAP  06/11/2024  . COLONOSCOPY  06/29/2025  . INFLUENZA VACCINE  Completed  . Hepatitis C Screening  Completed        Plan:    End of life planning; Advanced aging; Advanced directives discussed.  No HCPOA/Living Will.  Additional information declined at this time.  Medicare Attestation I have personally reviewed: The patient's medical and social history Their use of alcohol, tobacco or illicit drugs Their current medications and supplements The patient's functional ability including ADLs,fall risks, home safety risks, cognitive, and hearing and visual impairment Diet and physical activities Evidence for depression   The patient's weight, height, BMI, and visual acuity have been recorded in the chart.  I have made referrals and provided education to the patient based on review of the above and I have provided the patient with a written personalized care plan for preventive services.    During the course of the visit Nickalaus was educated and counseled about the following appropriate screening and preventive services:   Vaccines to include Pneumoccal, Influenza,  Hepatitis B, Td, Zostavax, HCV  Colorectal cancer screening-UTD  Cardiovascular disease screening-UTD  Glaucoma screening-annual eye exam  Nutrition counseling  Patient Instructions (the written plan) were given to the patient.   Varney Biles, LPN   1/69/6789    Reviewed above information.  Agree with  plan.  Dr Nicki Reaper

## 2016-04-21 ENCOUNTER — Other Ambulatory Visit: Payer: Self-pay | Admitting: Internal Medicine

## 2016-04-22 DIAGNOSIS — R002 Palpitations: Secondary | ICD-10-CM | POA: Diagnosis not present

## 2016-04-22 DIAGNOSIS — R000 Tachycardia, unspecified: Secondary | ICD-10-CM | POA: Diagnosis not present

## 2016-04-22 DIAGNOSIS — R079 Chest pain, unspecified: Secondary | ICD-10-CM | POA: Diagnosis not present

## 2016-04-22 DIAGNOSIS — I6529 Occlusion and stenosis of unspecified carotid artery: Secondary | ICD-10-CM | POA: Diagnosis not present

## 2016-04-22 DIAGNOSIS — Z8679 Personal history of other diseases of the circulatory system: Secondary | ICD-10-CM | POA: Diagnosis not present

## 2016-04-22 NOTE — Telephone Encounter (Signed)
ok'd rx for flonase

## 2016-04-22 NOTE — Telephone Encounter (Signed)
Previously prescribed by Sibyl Parr NP Last office visit 02/10/16, follow 3 months Next office visit 04/17/17 [name]

## 2016-05-16 DIAGNOSIS — G8929 Other chronic pain: Secondary | ICD-10-CM | POA: Diagnosis not present

## 2016-05-16 DIAGNOSIS — M961 Postlaminectomy syndrome, not elsewhere classified: Secondary | ICD-10-CM | POA: Diagnosis not present

## 2016-05-16 DIAGNOSIS — Y838 Other surgical procedures as the cause of abnormal reaction of the patient, or of later complication, without mention of misadventure at the time of the procedure: Secondary | ICD-10-CM | POA: Diagnosis not present

## 2016-05-16 DIAGNOSIS — Z8719 Personal history of other diseases of the digestive system: Secondary | ICD-10-CM | POA: Diagnosis not present

## 2016-05-16 DIAGNOSIS — M5030 Other cervical disc degeneration, unspecified cervical region: Secondary | ICD-10-CM | POA: Diagnosis not present

## 2016-05-16 DIAGNOSIS — M791 Myalgia: Secondary | ICD-10-CM | POA: Diagnosis not present

## 2016-05-16 DIAGNOSIS — R202 Paresthesia of skin: Secondary | ICD-10-CM | POA: Diagnosis not present

## 2016-05-16 DIAGNOSIS — M5136 Other intervertebral disc degeneration, lumbar region: Secondary | ICD-10-CM | POA: Diagnosis not present

## 2016-05-16 DIAGNOSIS — M79602 Pain in left arm: Secondary | ICD-10-CM | POA: Diagnosis not present

## 2016-06-08 ENCOUNTER — Ambulatory Visit (INDEPENDENT_AMBULATORY_CARE_PROVIDER_SITE_OTHER): Payer: Medicare HMO | Admitting: Internal Medicine

## 2016-06-08 ENCOUNTER — Encounter: Payer: Self-pay | Admitting: Internal Medicine

## 2016-06-08 VITALS — BP 138/94 | HR 88 | Temp 98.6°F | Wt 233.0 lb

## 2016-06-08 DIAGNOSIS — R21 Rash and other nonspecific skin eruption: Secondary | ICD-10-CM

## 2016-06-08 NOTE — Progress Notes (Signed)
Patient ID: [name], male   DOB: 24-Feb-1960, 56 y.o.   MRN: [id number]   Subjective:    Patient ID: [name], male    DOB: Jul 01, 1960, 56 y.o.   MRN: [id number]  HPI  Patient here as a work in with concerns regarding persistent rash.  Only has rash on left arm.  Present for a few weeks.  No new contacts or exposures.  No bites.  Does itch.  Some burning.  No fever.  Eating and drinking well.  States otherwise feels fine.  Has used steroid cream.  Made worse - burning sensation increased.  Taking benadryl.     Past Medical History:  Diagnosis Date  . Allergy   . Anoxic brain injury ([place])   . Chronic back pain    s/p 5 back surgeries  . Coronary artery disease    Cardiac catheterization in 2007 at [facility] showed 30% stenosis in proximal LAD. No other disease. Ejection fraction was 65%  . Depression   . H/O acute pancreatitis   . Hypercholesterolemia   . Hypertension   . Mild cognitive impairment with memory loss   . Pancreatitis    Past Surgical History:  Procedure Laterality Date  . abdomial surgery    . BACK SURGERY     multiple back surgeries (5)  . CARDIAC CATHETERIZATION     [facility]   . CHOLECYSTECTOMY  2013  . COLONOSCOPY WITH PROPOFOL N/A 06/30/2015   Procedure: COLONOSCOPY WITH PROPOFOL;  Surgeon: Lollie Sails, MD;  Location: [facility] ENDOSCOPY;  Service: Endoscopy;  Laterality: N/A;  . ESOPHAGOGASTRODUODENOSCOPY (EGD) WITH PROPOFOL N/A 03/30/2016   Procedure: ESOPHAGOGASTRODUODENOSCOPY (EGD) WITH PROPOFOL;  Surgeon: Manya Silvas, MD;  Location: [facility] ENDOSCOPY;  Service: Endoscopy;  Laterality: N/A;  . FUNCTIONAL ENDOSCOPIC SINUS SURGERY    . JOINT REPLACEMENT    . LAMINECTOMY    . LUMBAR SPINE SURGERY    . SHOULDER ARTHROSCOPY WITH ROTATOR CUFF REPAIR AND OPEN BICEPS TENODESIS Right   . SHOULDER ARTHROSCOPY WITH ROTATOR CUFF REPAIR AND OPEN BICEPS TENODESIS    . SPINE SURGERY    . TRIGGER POINT INJECTION    . VASECTOMY     With Reversal  . VEIN SURGERY  Right 2013   arm   Family History  Problem Relation Age of Onset  . Heart disease Father   . Heart attack Father   . Heart disease Mother   . Heart attack Mother   . Heart attack Brother        MI's x 2   . Hypertension Unknown   . Colon cancer Unknown    Social History   Social History  . Marital status: Married    Spouse name: N/A  . Number of children: 3  . Years of education: N/A   Social History Main Topics  . Smoking status: Never Smoker  . Smokeless tobacco: Never Used  . Alcohol use No  . Drug use: No  . Sexual activity: Yes   Other Topics Concern  . None   Social History Narrative  . None    Outpatient Encounter Prescriptions as of 06/08/2016  Medication Sig  . amphetamine-dextroamphetamine (ADDERALL) 20 MG tablet Take 20 mg by mouth 3 (three) times daily.  . Ascorbic Acid (VITAMIN C) 1000 MG tablet Take 1,000 mg by mouth daily.  . cetirizine (ZYRTEC) 10 MG tablet Take 10 mg by mouth daily.  . cholecalciferol (VITAMIN D) 1000 units tablet Take 1,000 Units by mouth daily.  Marland Kitchen  diazepam (VALIUM) 5 MG tablet TAKE 1-2 TABS BY MOUTH TWICE DAILY  . fluticasone (FLONASE) 50 MCG/ACT nasal spray PLACE 2 SPRAYS INTO BOTH NOSTRILS DAILY.  Marland Kitchen lipase/protease/amylase (CREON) 12000 units CPEP capsule Take 24,000 Units by mouth 3 (three) times daily with meals. Reported on 07/06/2015  . naproxen (NAPROSYN) 500 MG tablet Take by mouth. Reported on 07/06/2015  . Omega-3 Fatty Acids (FISH OIL) 500 MG CAPS Take by mouth daily. Pt doesn't how may MG he's taking.  . ondansetron (ZOFRAN ODT) 4 MG disintegrating tablet Take 1 tablet (4 mg total) by mouth every 6 (six) hours as needed for nausea or vomiting.  . pantoprazole (PROTONIX) 40 MG tablet Take by mouth.  . sildenafil (REVATIO) 20 MG tablet TAKE 4-5 TABLETS BY MOUTH DAILY AS NEEDED  . sucralfate (CARAFATE) 1 g tablet Take by mouth.  Marland Kitchen tiZANidine (ZANAFLEX) 4 MG tablet Take 4 mg by mouth 3 (three) times daily.  Marland Kitchen amLODipine  (NORVASC) 5 MG tablet Take by mouth.   No facility-administered encounter medications on file as of 06/08/2016.     Review of Systems  Constitutional: Negative for appetite change and fever.  Respiratory: Negative for shortness of breath.   Cardiovascular: Negative for leg swelling.  Gastrointestinal: Negative for diarrhea, nausea and vomiting.  Musculoskeletal: Negative for joint swelling and myalgias.  Skin: Positive for rash. Negative for wound.       Objective:    Physical Exam  Constitutional: He appears well-developed and well-nourished. No distress.  Cardiovascular: Normal rate and regular rhythm.   Pulmonary/Chest: Effort normal and breath sounds normal. No respiratory distress.  Musculoskeletal: He exhibits no edema.  Skin:  Erythematous based rash.  No vesicles.  Localized to left arm.    Psychiatric: He has a normal mood and affect. His behavior is normal.    BP (!) 138/94   Pulse 88   Temp 98.6 F (37 C) (Oral)   Wt 233 lb (105.7 kg)   SpO2 95%   BMI 31.60 kg/m  Wt Readings from Last 3 Encounters:  06/08/16 233 lb (105.7 kg)  04/14/16 232 lb 1.9 oz (105.3 kg)  03/30/16 233 lb (105.7 kg)     Lab Results  Component Value Date   WBC 7.3 03/27/2016   HGB 14.8 03/27/2016   HCT 41.5 03/27/2016   PLT 277 03/27/2016   GLUCOSE 96 03/27/2016   CHOL 177 02/08/2016   TRIG 189.0 (H) 02/08/2016   HDL 39.70 02/08/2016   LDLCALC 100 (H) 02/08/2016   ALT 26 03/27/2016   AST 27 03/27/2016   NA 139 03/27/2016   K 3.9 03/27/2016   CL 108 03/27/2016   CREATININE 1.00 03/27/2016   BUN 18 03/27/2016   CO2 24 03/27/2016   TSH 1.42 04/30/2015   PSA 0.72 06/12/2014   INR 0.9 10/27/2011   HGBA1C 5.3 09/14/2012       Assessment & Plan:   Problem List Items Addressed This Visit    Rash - Primary    Persistent rash as outlined.  Has tried topical steroid cream.  Felt worsened.  Taking benadryl.  Change to zyrtec for longer coverage.  Does not appear to be c/w  shingles rash.  Have dermatology evaluate.        Relevant Orders   Ambulatory referral to Dermatology       Einar Pheasant, MD

## 2016-06-12 ENCOUNTER — Encounter: Payer: Self-pay | Admitting: Internal Medicine

## 2016-06-12 NOTE — Assessment & Plan Note (Signed)
Persistent rash as outlined.  Has tried topical steroid cream.  Felt worsened.  Taking benadryl.  Change to zyrtec for longer coverage.  Does not appear to be c/w shingles rash.  Have dermatology evaluate.

## 2016-06-13 ENCOUNTER — Other Ambulatory Visit: Payer: Self-pay | Admitting: Internal Medicine

## 2016-06-14 DIAGNOSIS — D225 Melanocytic nevi of trunk: Secondary | ICD-10-CM | POA: Diagnosis not present

## 2016-06-14 DIAGNOSIS — L298 Other pruritus: Secondary | ICD-10-CM | POA: Diagnosis not present

## 2016-06-20 DIAGNOSIS — M791 Myalgia: Secondary | ICD-10-CM | POA: Diagnosis not present

## 2016-06-20 DIAGNOSIS — G8929 Other chronic pain: Secondary | ICD-10-CM | POA: Diagnosis not present

## 2016-06-20 DIAGNOSIS — M5136 Other intervertebral disc degeneration, lumbar region: Secondary | ICD-10-CM | POA: Diagnosis not present

## 2016-07-12 ENCOUNTER — Ambulatory Visit: Admit: 2016-07-12 | Payer: Medicare HMO | Admitting: Gastroenterology

## 2016-07-12 DIAGNOSIS — I2510 Atherosclerotic heart disease of native coronary artery without angina pectoris: Secondary | ICD-10-CM | POA: Diagnosis not present

## 2016-07-12 DIAGNOSIS — R002 Palpitations: Secondary | ICD-10-CM | POA: Diagnosis not present

## 2016-07-12 DIAGNOSIS — R55 Syncope and collapse: Secondary | ICD-10-CM | POA: Diagnosis not present

## 2016-07-12 DIAGNOSIS — R000 Tachycardia, unspecified: Secondary | ICD-10-CM | POA: Diagnosis not present

## 2016-07-12 DIAGNOSIS — I10 Essential (primary) hypertension: Secondary | ICD-10-CM | POA: Diagnosis not present

## 2016-07-12 SURGERY — ESOPHAGOGASTRODUODENOSCOPY (EGD) WITH PROPOFOL
Anesthesia: General

## 2016-07-18 ENCOUNTER — Other Ambulatory Visit: Payer: Self-pay | Admitting: Internal Medicine

## 2016-07-21 DIAGNOSIS — I2510 Atherosclerotic heart disease of native coronary artery without angina pectoris: Secondary | ICD-10-CM | POA: Diagnosis not present

## 2016-07-21 DIAGNOSIS — R000 Tachycardia, unspecified: Secondary | ICD-10-CM | POA: Diagnosis not present

## 2016-07-21 DIAGNOSIS — I10 Essential (primary) hypertension: Secondary | ICD-10-CM | POA: Diagnosis not present

## 2016-07-21 DIAGNOSIS — R55 Syncope and collapse: Secondary | ICD-10-CM | POA: Diagnosis not present

## 2016-08-11 ENCOUNTER — Ambulatory Visit: Payer: Medicare HMO | Attending: Neurology

## 2016-08-11 DIAGNOSIS — R0683 Snoring: Secondary | ICD-10-CM | POA: Insufficient documentation

## 2016-08-11 DIAGNOSIS — G4730 Sleep apnea, unspecified: Secondary | ICD-10-CM | POA: Diagnosis not present

## 2016-08-11 DIAGNOSIS — R400 Somnolence: Secondary | ICD-10-CM | POA: Diagnosis present

## 2016-08-11 DIAGNOSIS — R5383 Other fatigue: Secondary | ICD-10-CM | POA: Diagnosis present

## 2016-08-11 DIAGNOSIS — F5101 Primary insomnia: Secondary | ICD-10-CM | POA: Diagnosis not present

## 2016-08-11 DIAGNOSIS — R69 Illness, unspecified: Secondary | ICD-10-CM | POA: Diagnosis not present

## 2016-08-18 DIAGNOSIS — R000 Tachycardia, unspecified: Secondary | ICD-10-CM | POA: Diagnosis not present

## 2016-08-18 DIAGNOSIS — I10 Essential (primary) hypertension: Secondary | ICD-10-CM | POA: Diagnosis not present

## 2016-08-18 DIAGNOSIS — R079 Chest pain, unspecified: Secondary | ICD-10-CM | POA: Diagnosis not present

## 2016-08-26 ENCOUNTER — Other Ambulatory Visit: Payer: Self-pay | Admitting: Internal Medicine

## 2016-09-15 DIAGNOSIS — R69 Illness, unspecified: Secondary | ICD-10-CM | POA: Diagnosis not present

## 2016-09-30 ENCOUNTER — Other Ambulatory Visit: Payer: Self-pay | Admitting: Internal Medicine

## 2016-11-01 ENCOUNTER — Other Ambulatory Visit: Payer: Self-pay | Admitting: Family Medicine

## 2016-11-07 ENCOUNTER — Encounter (HOSPITAL_COMMUNITY): Payer: Self-pay | Admitting: Emergency Medicine

## 2016-11-07 ENCOUNTER — Emergency Department (HOSPITAL_COMMUNITY): Payer: Medicare HMO

## 2016-11-07 ENCOUNTER — Emergency Department (HOSPITAL_COMMUNITY)
Admission: EM | Admit: 2016-11-07 | Discharge: 2016-11-07 | Disposition: A | Discharge status: Home / Self Care | Payer: Medicare HMO | Attending: Emergency Medicine | Admitting: Emergency Medicine

## 2016-11-07 DIAGNOSIS — Z7982 Long term (current) use of aspirin: Secondary | ICD-10-CM | POA: Insufficient documentation

## 2016-11-07 DIAGNOSIS — Z880 Allergy status to penicillin: Secondary | ICD-10-CM | POA: Insufficient documentation

## 2016-11-07 DIAGNOSIS — M545 Low back pain, unspecified: Secondary | ICD-10-CM

## 2016-11-07 DIAGNOSIS — Z79899 Other long term (current) drug therapy: Secondary | ICD-10-CM | POA: Diagnosis not present

## 2016-11-07 DIAGNOSIS — R1013 Epigastric pain: Secondary | ICD-10-CM | POA: Insufficient documentation

## 2016-11-07 DIAGNOSIS — I10 Essential (primary) hypertension: Secondary | ICD-10-CM | POA: Insufficient documentation

## 2016-11-07 DIAGNOSIS — I249 Acute ischemic heart disease, unspecified: Secondary | ICD-10-CM | POA: Insufficient documentation

## 2016-11-07 DIAGNOSIS — G8929 Other chronic pain: Secondary | ICD-10-CM | POA: Diagnosis not present

## 2016-11-07 LAB — COMPREHENSIVE METABOLIC PANEL
ALBUMIN: 3.8 g/dL (ref 3.5–5.0)
ALK PHOS: 101 U/L (ref 38–126)
ALT: 34 U/L (ref 17–63)
ANION GAP: 9 (ref 5–15)
AST: 34 U/L (ref 15–41)
BILIRUBIN TOTAL: 0.6 mg/dL (ref 0.3–1.2)
BUN: 9 mg/dL (ref 6–20)
CALCIUM: 8.9 mg/dL (ref 8.9–10.3)
CO2: 21 mmol/L — ABNORMAL LOW (ref 22–32)
Chloride: 108 mmol/L (ref 101–111)
Creatinine, Ser: 1.03 mg/dL (ref 0.61–1.24)
Glucose, Bld: 102 mg/dL — ABNORMAL HIGH (ref 65–99)
POTASSIUM: 3.9 mmol/L (ref 3.5–5.1)
Sodium: 138 mmol/L (ref 135–145)
TOTAL PROTEIN: 6.8 g/dL (ref 6.5–8.1)

## 2016-11-07 LAB — CBC WITH DIFFERENTIAL/PLATELET
BASOS ABS: 0 10*3/uL (ref 0.0–0.1)
BASOS PCT: 1 %
EOS ABS: 0.1 10*3/uL (ref 0.0–0.7)
Eosinophils Relative: 2 %
HEMATOCRIT: 41.7 % (ref 39.0–52.0)
HEMOGLOBIN: 14.2 g/dL (ref 13.0–17.0)
Lymphocytes Relative: 38 %
Lymphs Abs: 2.2 10*3/uL (ref 0.7–4.0)
MCH: 30.3 pg (ref 26.0–34.0)
MCHC: 34.1 g/dL (ref 30.0–36.0)
MCV: 89.1 fL (ref 78.0–100.0)
MONOS PCT: 9 %
Monocytes Absolute: 0.5 10*3/uL (ref 0.1–1.0)
NEUTROS ABS: 3 10*3/uL (ref 1.7–7.7)
NEUTROS PCT: 50 %
Platelets: 256 10*3/uL (ref 150–400)
RBC: 4.68 MIL/uL (ref 4.22–5.81)
RDW: 13.2 % (ref 11.5–15.5)
WBC: 5.9 10*3/uL (ref 4.0–10.5)

## 2016-11-07 LAB — LIPASE, BLOOD: LIPASE: 31 U/L (ref 11–51)

## 2016-11-07 MED ORDER — MORPHINE SULFATE (PF) 4 MG/ML IV SOLN
4.0000 mg | Freq: Once | INTRAVENOUS | Status: AC
  Administered 2016-11-07: 4 mg via INTRAVENOUS
  Filled 2016-11-07: qty 1

## 2016-11-07 MED ORDER — KETOROLAC TROMETHAMINE 30 MG/ML IJ SOLN
30.0000 mg | Freq: Once | INTRAMUSCULAR | Status: AC
  Administered 2016-11-07: 30 mg via INTRAVENOUS
  Filled 2016-11-07: qty 1

## 2016-11-07 MED ORDER — NAPROXEN 375 MG PO TABS: 375.0000 mg | ORAL_TABLET | Freq: Two times a day (BID) | ORAL | 0 refills | Status: DC

## 2016-11-07 MED ORDER — ONDANSETRON HCL 4 MG/2ML IJ SOLN
4.0000 mg | Freq: Once | INTRAMUSCULAR | Status: AC
  Administered 2016-11-07: 4 mg via INTRAVENOUS
  Filled 2016-11-07: qty 2

## 2016-11-07 MED ORDER — HYDROMORPHONE HCL 1 MG/ML IJ SOLN
1.0000 mg | Freq: Once | INTRAMUSCULAR | Status: AC
  Administered 2016-11-07: 1 mg via INTRAVENOUS
  Filled 2016-11-07: qty 1

## 2016-11-07 NOTE — ED Triage Notes (Signed)
Patient coming from home with extensive back history, patient reports multiple surgeries and a titanium cylinder in L4 L 5, pain in this area on arrival. Patient reports bending over to brush his teeth this morning and not being able to walk or move legs since. Patient reports sensation and displays minimal movement to lower extremities.

## 2016-11-07 NOTE — ED Notes (Signed)
Patient ambulated with minor difficulty in room. EDP aware

## 2016-11-07 NOTE — ED Provider Notes (Signed)
[facility] DEPT Provider Note   CSN: [id number] Arrival date & time: 11/07/16  1108     History   Chief Complaint Chief Complaint  Patient presents with  . Back Pain    HPI [name] is a 56 y.o. male.  [name] is a 56 y.o. Male with a history of multiple back surgeries and chronic back pain who presents to the ED complaining of back pain that became worse today. Patient reports he was brushing his teeth in the bathroom and he bent over and felt a pop in his low back which brought him to his knees. He reports extreme back pain "all the way across L4 and L5 of my back." The girlfriend reports that he hobbled to the car with her help today. He reports feeling that his bilateral legs are weak. He reports chronic numbness to his side of his left leg and now increasing numbness tingling to his right leg. He reports pain all across his low back. He sees a Publishing rights manager at [facility]. He reports taking 15 mg of Valium at home prior to arrival to the emergency department today for his suspected back spasm. Patient also reports a history of pancreatitis and asked me to check his lipase because this has caused him to have back pain before. He reports chronic ongoing epigastric abdominal pain. He denies any vomiting. He reports going to multiple different hospitals previously to try and figure out his pancreatitis. He denies drinking alcohol. He's had previously had a cholecystectomy. He denies loss of bowel or bladder control. No history of IV drug use. No history of cancer. He denies fevers, chest pain, shortness of breath, vomiting, diarrhea, neck pain, dysuria, loss of bladder control, loss of bowel control, difficulty urinating, hematuria or rashes.   The history is provided by the patient and medical records. No language interpreter was used.  Back Pain   Associated symptoms include numbness, abdominal pain and weakness. Pertinent negatives include no chest pain, no fever, no  headaches and no dysuria.    Past Medical History:  Diagnosis Date  . Allergy   . Anoxic brain injury ([place])   . Chronic back pain    s/p 5 back surgeries  . Coronary artery disease    Cardiac catheterization in 2007 at [facility] showed 30% stenosis in proximal LAD. No other disease. Ejection fraction was 65%  . Depression   . H/O acute pancreatitis   . Hypercholesterolemia   . Mild cognitive impairment with memory loss   . Pancreatitis     Patient Active Problem List   Diagnosis Date Noted  . Abdominal pain 02/15/2016  . Fatigue 02/15/2016  . Chest pain 11/01/2015  . Rash 11/01/2015  . Diverticulosis of colon without hemorrhage 07/04/2015  . Visit for suture removal 05/19/2015  . Abnormal liver function tests 05/03/2015  . Left knee pain 04/08/2015  . Complete rotator cuff rupture of left shoulder 03/26/2015  . Hypertension 03/01/2015  . Fungal infection of foot 10/18/2014  . Health care maintenance 06/16/2014  . History of pancreatitis 06/12/2014  . Sinus tachycardia 06/27/2013  . DOE (dyspnea on exertion) 06/04/2013  . Sebaceous cyst 02/05/2013  . Hyponatremia 11/04/2012  . Short-term memory loss 05/06/2012  . Anemia 05/06/2012  . Neck fullness 03/27/2012  . Chronic back pain 12/01/2011  . Hypercholesteremia 12/01/2011  . Clinical depression 07/29/2011  . Failed back syndrome of lumbar spine 07/22/2011    Past Surgical History:  Procedure Laterality Date  . abdomial surgery    .  BACK SURGERY     multiple back surgeries (5)  . CARDIAC CATHETERIZATION     [facility]   . CHOLECYSTECTOMY  2013  . COLONOSCOPY WITH PROPOFOL N/A 06/30/2015   Procedure: COLONOSCOPY WITH PROPOFOL;  Surgeon: Lollie Sails, MD;  Location[facility] ENDOSCOPY;  Service: Endoscopy;  Laterality: N/A;  . ESOPHAGOGASTRODUODENOSCOPY (EGD) WITH PROPOFOL N/A 03/30/2016   Procedure: ESOPHAGOGASTRODUODENOSCOPY (EGD) WITH PROPOFOL;  Surgeon: Manya Silvas, MD;  Location: [facility] ENDOSCOPY;  Service: Endoscopy;   Laterality: N/A;  . FUNCTIONAL ENDOSCOPIC SINUS SURGERY    . JOINT REPLACEMENT    . LAMINECTOMY    . LUMBAR SPINE SURGERY    . SHOULDER ARTHROSCOPY WITH ROTATOR CUFF REPAIR AND OPEN BICEPS TENODESIS Right   . SHOULDER ARTHROSCOPY WITH ROTATOR CUFF REPAIR AND OPEN BICEPS TENODESIS    . SPINE SURGERY    . TRIGGER POINT INJECTION    . VASECTOMY     With Reversal  . VEIN SURGERY Right 2013   arm       Home Medications    Prior to Admission medications   Medication Sig Start Date End Date Taking? Authorizing Provider  amphetamine-dextroamphetamine (ADDERALL) 20 MG tablet Take 20 mg by mouth 2 (two) times daily.    Yes [provider]  aspirin 81 MG chewable tablet Chew 81 mg by mouth daily as needed for mild pain.   Yes [provider]  cetirizine (ZYRTEC) 10 MG tablet Take 10 mg by mouth daily.   Yes [provider]  diazepam (VALIUM) 5 MG tablet TAKE 5MG  BY MOUTH AT TWICE DAILY 04/06/16  Yes [provider]  fluticasone (FLONASE) 50 MCG/ACT nasal spray PLACE 2 SPRAYS INTO BOTH NOSTRILS DAILY. 04/22/16  Yes Einar Pheasant, MD  ondansetron (ZOFRAN ODT) 4 MG disintegrating tablet Take 1 tablet (4 mg total) by mouth every 6 (six) hours as needed for nausea or vomiting. 03/27/16  Yes Delman Kitten, MD  pantoprazole (PROTONIX) 40 MG tablet Take 40 mg by mouth daily.  12/01/15  Yes [provider]  sildenafil (REVATIO) 20 MG tablet TAKE 4-5 TABLETS BY MOUTH DAILY AS NEEDED 03/25/16  Yes [provider]  tiZANidine (ZANAFLEX) 4 MG tablet Take 4 mg by mouth at bedtime.    Yes [provider]  amLODipine (NORVASC) 5 MG tablet Take 5 mg by mouth daily.  04/02/15 04/01/16  [provider]  fluticasone (FLONASE) 50 MCG/ACT nasal spray SPRAY 2 SPRAYS INTO EACH NOSTRIL EVERY DAY Patient not taking: Reported on 11/07/2016 11/01/16   Einar Pheasant, MD  naproxen (NAPROSYN) 375 MG tablet Take 1 tablet (375 mg total) by mouth 2 (two) times  daily with a meal. 11/07/16   Waynetta Pean, PA-C    Family History Family History  Problem Relation Age of Onset  . Heart disease Father   . Heart attack Father   . Heart disease Mother   . Heart attack Mother   . Heart attack Brother        MI's x 2   . Hypertension Unknown   . Colon cancer Unknown     Social History Social History  Substance Use Topics  . Smoking status: Never Smoker  . Smokeless tobacco: Never Used  . Alcohol use No     Allergies   Penicillins and Sulfa antibiotics   Review of Systems Review of Systems  Constitutional: Negative for chills and fever.  HENT: Negative for congestion and sore throat.   Eyes: Negative for visual disturbance.  Respiratory: Negative  for cough and shortness of breath.   Cardiovascular: Negative for chest pain.  Gastrointestinal: Positive for abdominal pain. Negative for diarrhea and vomiting.  Genitourinary: Negative for difficulty urinating, dysuria, frequency, hematuria, testicular pain and urgency.  Musculoskeletal: Positive for back pain and gait problem. Negative for neck pain.  Skin: Negative for rash.  Neurological: Positive for weakness and numbness. Negative for syncope, light-headedness and headaches.     Physical Exam Updated Vital Signs BP 121/77   Pulse (!) 58   Temp 98 F (36.7 C) (Oral)   Resp 18   Ht 6\' 1"  (1.854 m)   Wt 103 kg (227 lb)   SpO2 94%   BMI 29.95 kg/m   Physical Exam  Constitutional: He appears well-developed and well-nourished. No distress.  Nontoxic appearing.  HENT:  Head: Normocephalic and atraumatic.  Mouth/Throat: Oropharynx is clear and moist.  Eyes: Pupils are equal, round, and reactive to light. Conjunctivae are normal. Right eye exhibits no discharge. Left eye exhibits no discharge.  Neck: Neck supple.  Cardiovascular: Normal rate, regular rhythm, normal heart sounds and intact distal pulses.  Exam reveals no gallop and no friction rub.   No murmur heard. Bilateral  radial, posterior tibialis and dorsalis pedis pulses are intact.    Pulmonary/Chest: Effort normal and breath sounds normal. No respiratory distress. He has no wheezes. He has no rales.  Abdominal: Soft. Bowel sounds are normal. He exhibits no distension and no mass. There is tenderness. There is no rebound and no guarding.  Mild epigastric abdominal tenderness to palpation. Abdomen is otherwise nontender to palpation.  Genitourinary:  Genitourinary Comments: DRE with male RN as Producer, television/film/video. Normal anal sphincter tone.   Musculoskeletal: He exhibits tenderness. He exhibits no edema or deformity.  Tenderness across his bilateral lumbar back. No overlying skin changes. No crepitus or deformity. No back erythema, edema or warmth. Patient will barely lift his legs off the bed during exam. This appears to be effort or pain related. He is able to plantar and dorsiflex bilaterally without difficulty.  Lymphadenopathy:    He has no cervical adenopathy.  Neurological: He is alert. Coordination normal.  Sensation is intact in his bilateral feet. He reports some decreased sensation to the lateral aspects of his legs. Patient will barely lift his legs off the bed during exam. This appears to be effort or pain related. He is able to plantar and dorsiflex bilaterally without difficulty. His uses his legs to push him over on his side for me to examine his back without difficulty.   Skin: Skin is warm and dry. Capillary refill takes less than 2 seconds. No rash noted. He is not diaphoretic. No erythema. No pallor.  Psychiatric: He has a normal mood and affect. His behavior is normal.  Nursing note and vitals reviewed.    ED Treatments / Results  Labs (all labs ordered are listed, but only abnormal results are displayed) Labs Reviewed  COMPREHENSIVE METABOLIC PANEL - Abnormal; Notable for the following:       Result Value   CO2 21 (*)    Glucose, Bld 102 (*)    All other components within normal limits    LIPASE, BLOOD  CBC WITH DIFFERENTIAL/PLATELET    EKG  EKG Interpretation None       Radiology Dg Lumbar Spine Complete  Result Date: 11/07/2016 CLINICAL DATA:  Pt c/o lower back pain; pt states he was brushing his teeth this morning and felt a pop in his lower back. Hx of prior  lower back surgery. EXAM: LUMBAR SPINE - COMPLETE 4+ VIEW COMPARISON:  CT, 03/27/2016 FINDINGS: Pedicle screws are noted at L 2, L3 and L4. There radiopaque connecting rods between the L3 and L4 pedicle screws. The orthopedic hardware appears well-seated with no evidence of loosening and unchanged from the prior CT. There is bone graft material spanning the narrowed L3-L4 and L4-L5 disc spaces, also unchanged. No acute fracture.  No spondylolisthesis. Mild loss of disc height with small endplate spurs at L 1-L2. Soft tissues are unremarkable. IMPRESSION: 1. No fracture, spondylolisthesis or acute finding. No evidence of loosening of the orthopedic hardware. 2. Stable orthopedic hardware.  Mature fusion at L3-L4 and L4-L5. 3. No change from the prior abdomen and pelvis CT. Electronically Signed   By: Lajean Manes M.D.   On: 11/07/2016 14:16    Procedures Procedures (including critical care time)  Medications Ordered in ED Medications  ondansetron (ZOFRAN) injection 4 mg (4 mg Intravenous Given 11/07/16 1216)  morphine 4 MG/ML injection 4 mg (4 mg Intravenous Given 11/07/16 1217)  HYDROmorphone (DILAUDID) injection 1 mg (1 mg Intravenous Given 11/07/16 1312)  ketorolac (TORADOL) 30 MG/ML injection 30 mg (30 mg Intravenous Given 11/07/16 1434)  ondansetron (ZOFRAN) injection 4 mg (4 mg Intravenous Given 11/07/16 1448)     Initial Impression / Assessment and Plan / ED Course  I have reviewed the triage vital signs and the nursing notes.  Pertinent labs & imaging results that were available during my care of the patient were reviewed by me and considered in my medical decision making (see chart for details).     This is a 56 y.o. Male with a history of multiple back surgeries and chronic back pain who presents to the ED complaining of back pain that became worse today. Patient reports he was brushing his teeth in the bathroom and he bent over and felt a pop in his low back which brought him to his knees. He reports extreme back pain "all the way across L4 and L5 of my back." The girlfriend reports that he hobbled to the car with her help today. He reports feeling that his bilateral legs are weak. He reports chronic numbness to his side of his left leg and now increasing numbness tingling to his right leg. He reports pain all across his low back. He sees a Publishing rights manager at [facility]. He reports taking 15 mg of Valium at home prior to arrival to the emergency department today for his suspected back spasm. Patient also reports a history of pancreatitis and asked me to check his lipase because this has caused him to have back pain before. He reports chronic ongoing epigastric abdominal pain. He denies any vomiting. On exam the patient is afebrile and nontoxic-appearing. He has tenderness diffusely across his bilateral low back musculature. No back erythema, deformity or ecchymosis. On initial exam he appears to have effort related weakness his bilateral lower extremities. This is likely effort related or due to pain. He is otherwise neurovascularly intact. Blood work is unremarkable. Normal lipase. Lumbar spine x-ray shows no fracture, spondylolisthesis or acute finding. No evidence of loosening of orthopedic hardware. Stable orthopedic hardware. No change from the prior imaging. After patient received pain medication he is able to stand up and walk without assistance. His digital rectal exam is normal. He has normal anal sphincter tone. I do not have concern for cauda equina. He has follow-up with his neurosurgeon this week. I encouraged him to keep this appointment. Discussed return  precautions. He has Valium at home  for muscle spasms. Will have him take naproxen twice a day for pain and anti-inflammatory.  I advised the patient to follow-up with their primary care provider this week. I advised the patient to return to the emergency department with new or worsening symptoms or new concerns. The patient verbalized understanding and agreement with plan.     Final Clinical Impressi Suspect is related to back pain or hisons(s) / ED Diagnoses   Final diagnoses:  Acute bilateral low back pain without sciatica    New Prescriptions New Prescriptions   NAPROXEN (NAPROSYN) 375 MG TABLET    Take 1 tablet (375 mg total) by mouth 2 (two) times daily with a meal.     Waynetta Pean, PA-C 11/07/16 1606    Duffy Bruce, MD 11/07/16 2121

## 2016-11-16 DIAGNOSIS — G8929 Other chronic pain: Secondary | ICD-10-CM | POA: Diagnosis not present

## 2016-11-16 DIAGNOSIS — R202 Paresthesia of skin: Secondary | ICD-10-CM | POA: Diagnosis not present

## 2016-11-16 DIAGNOSIS — M5030 Other cervical disc degeneration, unspecified cervical region: Secondary | ICD-10-CM | POA: Diagnosis not present

## 2016-11-16 DIAGNOSIS — M79602 Pain in left arm: Secondary | ICD-10-CM | POA: Diagnosis not present

## 2016-11-16 DIAGNOSIS — M7918 Myalgia, other site: Secondary | ICD-10-CM | POA: Diagnosis not present

## 2016-11-16 DIAGNOSIS — Z981 Arthrodesis status: Secondary | ICD-10-CM | POA: Diagnosis not present

## 2016-11-16 DIAGNOSIS — Z8719 Personal history of other diseases of the digestive system: Secondary | ICD-10-CM | POA: Diagnosis not present

## 2016-11-16 DIAGNOSIS — M5136 Other intervertebral disc degeneration, lumbar region: Secondary | ICD-10-CM | POA: Diagnosis not present

## 2016-11-28 ENCOUNTER — Other Ambulatory Visit: Payer: Self-pay | Admitting: Internal Medicine

## 2016-12-14 DIAGNOSIS — R202 Paresthesia of skin: Secondary | ICD-10-CM | POA: Diagnosis not present

## 2016-12-14 DIAGNOSIS — M5126 Other intervertebral disc displacement, lumbar region: Secondary | ICD-10-CM | POA: Diagnosis not present

## 2016-12-14 DIAGNOSIS — M5030 Other cervical disc degeneration, unspecified cervical region: Secondary | ICD-10-CM | POA: Diagnosis not present

## 2016-12-14 DIAGNOSIS — M7918 Myalgia, other site: Secondary | ICD-10-CM | POA: Diagnosis not present

## 2016-12-14 DIAGNOSIS — M50323 Other cervical disc degeneration at C6-C7 level: Secondary | ICD-10-CM | POA: Diagnosis not present

## 2016-12-14 DIAGNOSIS — Y838 Other surgical procedures as the cause of abnormal reaction of the patient, or of later complication, without mention of misadventure at the time of the procedure: Secondary | ICD-10-CM | POA: Diagnosis not present

## 2016-12-14 DIAGNOSIS — M47817 Spondylosis without myelopathy or radiculopathy, lumbosacral region: Secondary | ICD-10-CM | POA: Diagnosis not present

## 2016-12-14 DIAGNOSIS — G8929 Other chronic pain: Secondary | ICD-10-CM | POA: Diagnosis not present

## 2016-12-14 DIAGNOSIS — M79602 Pain in left arm: Secondary | ICD-10-CM | POA: Diagnosis not present

## 2016-12-14 DIAGNOSIS — M25562 Pain in left knee: Secondary | ICD-10-CM | POA: Diagnosis not present

## 2016-12-14 DIAGNOSIS — M5136 Other intervertebral disc degeneration, lumbar region: Secondary | ICD-10-CM | POA: Diagnosis not present

## 2016-12-14 DIAGNOSIS — M961 Postlaminectomy syndrome, not elsewhere classified: Secondary | ICD-10-CM | POA: Diagnosis not present

## 2016-12-15 DIAGNOSIS — I10 Essential (primary) hypertension: Secondary | ICD-10-CM | POA: Diagnosis not present

## 2016-12-15 DIAGNOSIS — R002 Palpitations: Secondary | ICD-10-CM | POA: Diagnosis not present

## 2016-12-15 DIAGNOSIS — R079 Chest pain, unspecified: Secondary | ICD-10-CM | POA: Diagnosis not present

## 2016-12-15 DIAGNOSIS — R000 Tachycardia, unspecified: Secondary | ICD-10-CM | POA: Diagnosis not present

## 2016-12-26 ENCOUNTER — Other Ambulatory Visit: Payer: Self-pay | Admitting: Internal Medicine

## 2017-01-18 DIAGNOSIS — M5030 Other cervical disc degeneration, unspecified cervical region: Secondary | ICD-10-CM | POA: Diagnosis not present

## 2017-01-18 DIAGNOSIS — M25562 Pain in left knee: Secondary | ICD-10-CM | POA: Diagnosis not present

## 2017-01-18 DIAGNOSIS — G8929 Other chronic pain: Secondary | ICD-10-CM | POA: Diagnosis not present

## 2017-01-18 DIAGNOSIS — M7918 Myalgia, other site: Secondary | ICD-10-CM | POA: Diagnosis not present

## 2017-01-18 DIAGNOSIS — Z981 Arthrodesis status: Secondary | ICD-10-CM | POA: Diagnosis not present

## 2017-01-18 DIAGNOSIS — M5136 Other intervertebral disc degeneration, lumbar region: Secondary | ICD-10-CM | POA: Diagnosis not present

## 2017-01-18 DIAGNOSIS — Z87828 Personal history of other (healed) physical injury and trauma: Secondary | ICD-10-CM | POA: Diagnosis not present

## 2017-03-14 ENCOUNTER — Encounter: Payer: Self-pay | Admitting: Internal Medicine

## 2017-03-14 DIAGNOSIS — R7989 Other specified abnormal findings of blood chemistry: Secondary | ICD-10-CM

## 2017-03-14 DIAGNOSIS — R945 Abnormal results of liver function studies: Secondary | ICD-10-CM

## 2017-03-14 DIAGNOSIS — Z8719 Personal history of other diseases of the digestive system: Secondary | ICD-10-CM

## 2017-03-14 DIAGNOSIS — E7800 Pure hypercholesterolemia, unspecified: Secondary | ICD-10-CM

## 2017-03-14 DIAGNOSIS — D649 Anemia, unspecified: Secondary | ICD-10-CM

## 2017-03-17 ENCOUNTER — Telehealth: Payer: Self-pay | Admitting: *Deleted

## 2017-03-17 NOTE — Telephone Encounter (Signed)
Copied from [facility] [place]. Topic: Appointment Scheduling - Scheduling Inquiry for Clinic >> Mar 17, 2017  2:44 PM [name] wrote: Reason for CRM: PT has been talking with Dr Nicki Reaper via Deloris Ping email and states she wants him to set up a follow up apt for his blood pressure issue.  The soonest opening I see is May 10.  Tried to call office to see if I could put him in a same day slot. No apt has been made.  Please contact pt with an apt for his bp follow up apt

## 2017-03-20 NOTE — Telephone Encounter (Signed)
sched pt

## 2017-03-20 NOTE — Telephone Encounter (Signed)
Can we move this up?

## 2017-03-21 NOTE — Telephone Encounter (Signed)
I can see him at 11:30 on 03/31/17.  This will be a little earlier then his scheduled appt.  (let him know that I am gone part of the end of February is why appt out).  I have placed the order for the labs.  Please schedule him for a fasting lab appt.  Thanks

## 2017-03-27 NOTE — Telephone Encounter (Signed)
Fasting labs scheduled patient keeping original appointment.

## 2017-03-28 ENCOUNTER — Other Ambulatory Visit (INDEPENDENT_AMBULATORY_CARE_PROVIDER_SITE_OTHER): Payer: Medicare HMO

## 2017-03-28 ENCOUNTER — Encounter: Payer: Self-pay | Admitting: Internal Medicine

## 2017-03-28 DIAGNOSIS — D649 Anemia, unspecified: Secondary | ICD-10-CM | POA: Diagnosis not present

## 2017-03-28 DIAGNOSIS — Z8719 Personal history of other diseases of the digestive system: Secondary | ICD-10-CM

## 2017-03-28 DIAGNOSIS — R7989 Other specified abnormal findings of blood chemistry: Secondary | ICD-10-CM

## 2017-03-28 DIAGNOSIS — R945 Abnormal results of liver function studies: Secondary | ICD-10-CM

## 2017-03-28 DIAGNOSIS — E7800 Pure hypercholesterolemia, unspecified: Secondary | ICD-10-CM | POA: Diagnosis not present

## 2017-03-28 LAB — HEPATIC FUNCTION PANEL
ALBUMIN: 4.1 g/dL (ref 3.5–5.2)
ALT: 20 U/L (ref 0–53)
AST: 17 U/L (ref 0–37)
Alkaline Phosphatase: 111 U/L (ref 39–117)
Bilirubin, Direct: 0 mg/dL (ref 0.0–0.3)
TOTAL PROTEIN: 7.2 g/dL (ref 6.0–8.3)
Total Bilirubin: 0.3 mg/dL (ref 0.2–1.2)

## 2017-03-28 LAB — BASIC METABOLIC PANEL
BUN: 19 mg/dL (ref 6–23)
CHLORIDE: 105 meq/L (ref 96–112)
CO2: 28 mEq/L (ref 19–32)
CREATININE: 0.97 mg/dL (ref 0.40–1.50)
Calcium: 9.4 mg/dL (ref 8.4–10.5)
GFR: 85.53 mL/min (ref >60.00)
Glucose, Bld: 99 mg/dL (ref 70–99)
POTASSIUM: 4.9 meq/L (ref 3.5–5.1)
Sodium: 139 mEq/L (ref 135–145)

## 2017-03-28 LAB — LIPID PANEL
CHOLESTEROL: 181 mg/dL (ref 0–200)
HDL: 37.6 mg/dL — AB (ref >39.00)
LDL Cholesterol: 112 mg/dL — ABNORMAL HIGH (ref 0–99)
NONHDL: 143.79
TRIGLYCERIDES: 161 mg/dL — AB (ref 0.0–149.0)
Total CHOL/HDL Ratio: 5
VLDL: 32.2 mg/dL (ref 0.0–40.0)

## 2017-03-28 LAB — CBC WITH DIFFERENTIAL/PLATELET
BASOS ABS: 0.1 10*3/uL (ref 0.0–0.1)
Basophils Relative: 0.9 % (ref 0.0–3.0)
EOS PCT: 2.6 % (ref 0.0–5.0)
Eosinophils Absolute: 0.2 10*3/uL (ref 0.0–0.7)
HCT: 41.4 % (ref 39.0–52.0)
HEMOGLOBIN: 14.2 g/dL (ref 13.0–17.0)
Lymphocytes Relative: 27 % (ref 12.0–46.0)
Lymphs Abs: 1.9 10*3/uL (ref 0.7–4.0)
MCHC: 34.4 g/dL (ref 30.0–36.0)
MCV: 91.4 fl (ref 78.0–100.0)
Monocytes Absolute: 0.5 10*3/uL (ref 0.1–1.0)
Monocytes Relative: 6.7 % (ref 3.0–12.0)
Neutro Abs: 4.5 10*3/uL (ref 1.4–7.7)
Neutrophils Relative %: 62.8 % (ref 43.0–77.0)
Platelets: 299 10*3/uL (ref 150.0–400.0)
RBC: 4.53 Mil/uL (ref 4.22–5.81)
RDW: 13.1 % (ref 11.5–15.5)
WBC: 7.1 10*3/uL (ref 4.0–10.5)

## 2017-03-28 LAB — VITAMIN B12: VITAMIN B 12: 504 pg/mL (ref 211–911)

## 2017-03-28 LAB — FERRITIN: FERRITIN: 35.5 ng/mL (ref 22.0–322.0)

## 2017-03-28 LAB — TSH: TSH: 2.18 u[IU]/mL (ref 0.35–4.50)

## 2017-03-28 LAB — LIPASE: LIPASE: 34 U/L (ref 11.0–59.0)

## 2017-04-13 ENCOUNTER — Ambulatory Visit (INDEPENDENT_AMBULATORY_CARE_PROVIDER_SITE_OTHER): Payer: Medicare HMO

## 2017-04-13 ENCOUNTER — Ambulatory Visit (INDEPENDENT_AMBULATORY_CARE_PROVIDER_SITE_OTHER): Payer: Medicare HMO | Admitting: Internal Medicine

## 2017-04-13 ENCOUNTER — Encounter: Payer: Self-pay | Admitting: Internal Medicine

## 2017-04-13 VITALS — BP 140/94 | HR 75 | Temp 97.7°F | Resp 18 | Wt 226.2 lb

## 2017-04-13 DIAGNOSIS — R5383 Other fatigue: Secondary | ICD-10-CM | POA: Diagnosis not present

## 2017-04-13 DIAGNOSIS — M545 Low back pain: Secondary | ICD-10-CM

## 2017-04-13 DIAGNOSIS — R0789 Other chest pain: Secondary | ICD-10-CM | POA: Diagnosis not present

## 2017-04-13 DIAGNOSIS — L989 Disorder of the skin and subcutaneous tissue, unspecified: Secondary | ICD-10-CM

## 2017-04-13 DIAGNOSIS — R0609 Other forms of dyspnea: Secondary | ICD-10-CM

## 2017-04-13 DIAGNOSIS — R0602 Shortness of breath: Secondary | ICD-10-CM

## 2017-04-13 DIAGNOSIS — I10 Essential (primary) hypertension: Secondary | ICD-10-CM

## 2017-04-13 DIAGNOSIS — E7800 Pure hypercholesterolemia, unspecified: Secondary | ICD-10-CM

## 2017-04-13 DIAGNOSIS — G8929 Other chronic pain: Secondary | ICD-10-CM | POA: Diagnosis not present

## 2017-04-13 DIAGNOSIS — D649 Anemia, unspecified: Secondary | ICD-10-CM | POA: Diagnosis not present

## 2017-04-13 NOTE — Assessment & Plan Note (Signed)
Feel multifactorial.  Cardiac w/up as outlined.  Recent labs wnl.  Eating.  Check cxr.  Discussed the need for f/u with psychiatry to adjust medication if feels is not working.

## 2017-04-13 NOTE — Assessment & Plan Note (Signed)
Overall feels stable.  Followed by pain clinic.

## 2017-04-13 NOTE — Assessment & Plan Note (Signed)
Having persistent intermittent chest pain with sob and decreased energy.  In reviewing, he had stress test in 07/2017 - negative for ischemia.  Symptoms have progressed/worsened since then.  Previous hear cath with 30% lesion - more than 10 years ago.  With risk factors and given presenting symptoms, EKG - SR with no acute ischemic changes.  After discussion, it was decided to refer back to cardiology to confirm no further cardiac w/up warranted as outlined.  Question of need for holter -- given near syncope.  Question of need for echo - given sob and pain as outlined.

## 2017-04-13 NOTE — Assessment & Plan Note (Signed)
Recent hgb wnl.  

## 2017-04-13 NOTE — Assessment & Plan Note (Signed)
In reviewing blood pressures, outside blood pressure readings are mostly averaging 120-130s/70-80s.  Amlodipine dose was just adjusted to 10mg  q day.  Continue same medication regimen.  Follow pressures.

## 2017-04-13 NOTE — Progress Notes (Addendum)
Patient ID: [name], male   DOB: Nov 05, 1960, 57 y.o.   MRN: [id number]   Subjective:    Patient ID: [name], male    DOB: 04/25/1960, 57 y.o.   MRN: [id number]  HPI  Patient here as a work in with concerns regarding not feeling well.  Reports increased fatigue, sob and near syncopal episodes.  Some intermittent chest pain.  Eating.  No vomiting.  No abdominal pain.  Bowels are moving.  Persistent back pain, but he feels this is stable.  Followed by pain clinic.  Has had issues with adderall - with increased heart rate and blood pressure.  Was stopped and placed on modafinil.  This is not working as well.  States he does not feel good and is unsure of cause of symptoms.  Saw cardiology 03/27/17.  Blood pressure elevated previously.  Blood pressure medication adjusted.  On 10mg  of amlodipine now.  Reviewed his outside readings.  Overall appear to be in 120-130s/70-80s range.  Discussed the need for him to return to his therapist for medication adjustment if his current regimen is not helping.     Past Medical History:  Diagnosis Date  . Allergy   . Anoxic brain injury ([place])   . Chronic back pain    s/p 5 back surgeries  . Coronary artery disease    Cardiac catheterization in 2007 at [facility] showed 30% stenosis in proximal LAD. No other disease. Ejection fraction was 65%  . Depression   . H/O acute pancreatitis   . Hypercholesterolemia   . Mild cognitive impairment with memory loss   . Pancreatitis    Past Surgical History:  Procedure Laterality Date  . abdomial surgery    . BACK SURGERY     multiple back surgeries (5)  . CARDIAC CATHETERIZATION     [facility]   . CHOLECYSTECTOMY  2013  . COLONOSCOPY WITH PROPOFOL N/A 06/30/2015   Procedure: COLONOSCOPY WITH PROPOFOL;  Surgeon: Lollie Sails, MD;  Location: [facility] ENDOSCOPY;  Service: Endoscopy;  Laterality: N/A;  . ESOPHAGOGASTRODUODENOSCOPY (EGD) WITH PROPOFOL N/A 03/30/2016   Procedure: ESOPHAGOGASTRODUODENOSCOPY (EGD) WITH  PROPOFOL;  Surgeon: Manya Silvas, MD;  Location: [facility] ENDOSCOPY;  Service: Endoscopy;  Laterality: N/A;  . FUNCTIONAL ENDOSCOPIC SINUS SURGERY    . JOINT REPLACEMENT    . LAMINECTOMY    . LUMBAR SPINE SURGERY    . SHOULDER ARTHROSCOPY WITH ROTATOR CUFF REPAIR AND OPEN BICEPS TENODESIS Right   . SHOULDER ARTHROSCOPY WITH ROTATOR CUFF REPAIR AND OPEN BICEPS TENODESIS    . SPINE SURGERY    . TRIGGER POINT INJECTION    . VASECTOMY     With Reversal  . VEIN SURGERY Right 2013   arm   Family History  Problem Relation Age of Onset  . Heart disease Father   . Heart attack Father   . Heart disease Mother   . Heart attack Mother   . Heart attack Brother        MI's x 2   . Hypertension Unknown   . Colon cancer Unknown    Social History   Socioeconomic History  . Marital status: Married    Spouse name: None  . Number of children: 3  . Years of education: None  . Highest education level: None  Social Needs  . Financial resource strain: None  . Food insecurity - worry: None  . Food insecurity - inability: None  . Transportation needs - medical: None  . Transportation needs -  non-medical: None  Occupational History  . None  Tobacco Use  . Smoking status: Never Smoker  . Smokeless tobacco: Never Used  Substance and Sexual Activity  . Alcohol use: No    Alcohol/week: 0.0 oz  . Drug use: No  . Sexual activity: Yes  Other Topics Concern  . None  Social History Narrative  . None    Outpatient Encounter Medications as of 04/13/2017  Medication Sig  . amLODipine (NORVASC) 5 MG tablet TAKE 2 TABLETS (10 MG TOTAL) BY MOUTH ONCE DAILY  . aspirin 81 MG chewable tablet Chew 81 mg by mouth daily as needed for mild pain.  . cetirizine (ZYRTEC) 10 MG tablet Take 10 mg by mouth daily.  . diazepam (VALIUM) 5 MG tablet TAKE 5MG  BY MOUTH AT TWICE DAILY  . fluticasone (FLONASE) 50 MCG/ACT nasal spray SPRAY 2 SPRAYS INTO EACH NOSTRIL EVERY DAY  . naproxen (NAPROSYN) 375 MG tablet Take 1  tablet (375 mg total) by mouth 2 (two) times daily with a meal.  . ondansetron (ZOFRAN ODT) 4 MG disintegrating tablet Take 1 tablet (4 mg total) by mouth every 6 (six) hours as needed for nausea or vomiting.  . pantoprazole (PROTONIX) 40 MG tablet Take 40 mg by mouth daily.   . sildenafil (REVATIO) 20 MG tablet TAKE 4-5 TABLETS BY MOUTH DAILY AS NEEDED  . tiZANidine (ZANAFLEX) 4 MG tablet Take 4 mg by mouth at bedtime.   . [DISCONTINUED] amLODipine (NORVASC) 5 MG tablet Take 5 mg by mouth daily.   . [DISCONTINUED] amphetamine-dextroamphetamine (ADDERALL) 20 MG tablet Take 20 mg by mouth 2 (two) times daily.    No facility-administered encounter medications on file as of 04/13/2017.     Review of Systems  Constitutional: Positive for fatigue. Negative for fever.  HENT: Negative for congestion and sinus pressure.   Respiratory: Positive for shortness of breath. Negative for cough and chest tightness.   Cardiovascular: Positive for chest pain. Negative for palpitations and leg swelling.  Gastrointestinal: Negative for diarrhea, nausea and vomiting.       No significant abdominal pain currently.    Genitourinary: Negative for difficulty urinating and dysuria.  Musculoskeletal: Positive for back pain. Negative for joint swelling.  Skin: Negative for color change and rash.  Neurological: Negative for dizziness and headaches.       Near syncope as outlined.    Psychiatric/Behavioral: Negative for agitation.       Denies depression or suicidal ideations.        Objective:     Blood pressure rechecked by me:  130-132/88-90 - not orthostatic on exam.    Physical Exam  Constitutional: He appears well-developed and well-nourished. No distress.  HENT:  Nose: Nose normal.  Mouth/Throat: Oropharynx is clear and moist.  Neck: Neck supple. No thyromegaly present.  Cardiovascular: Normal rate and regular rhythm.  Pulmonary/Chest: Effort normal and breath sounds normal. No respiratory distress.    Abdominal: Soft. Bowel sounds are normal.  No significant pain to palpation over the abdomen.    Musculoskeletal: He exhibits no edema or tenderness.  Lymphadenopathy:    He has no cervical adenopathy.  Skin: No rash noted. No erythema.  Psychiatric: He has a normal mood and affect. His behavior is normal.    BP (!) 140/94 (BP Location: Left Arm, Patient Position: Sitting, Cuff Size: Large)   Pulse 75   Temp 97.7 F (36.5 C) (Oral)   Resp 18   Wt 226 lb 3.2 oz (102.6 kg)  SpO2 98%   BMI 29.84 kg/m  Wt Readings from Last 3 Encounters:  04/13/17 226 lb 3.2 oz (102.6 kg)  11/07/16 227 lb (103 kg)  06/08/16 233 lb (105.7 kg)     Lab Results  Component Value Date   WBC 7.1 03/28/2017   HGB 14.2 03/28/2017   HCT 41.4 03/28/2017   PLT 299.0 03/28/2017   GLUCOSE 99 03/28/2017   CHOL 181 03/28/2017   TRIG 161.0 (H) 03/28/2017   HDL 37.60 (L) 03/28/2017   LDLCALC 112 (H) 03/28/2017   ALT 20 03/28/2017   AST 17 03/28/2017   NA 139 03/28/2017   K 4.9 03/28/2017   CL 105 03/28/2017   CREATININE 0.97 03/28/2017   BUN 19 03/28/2017   CO2 28 03/28/2017   TSH 2.18 03/28/2017   PSA 0.72 06/12/2014   INR 0.9 10/27/2011   HGBA1C 5.3 09/14/2012    Dg Lumbar Spine Complete  Result Date: 11/07/2016 CLINICAL DATA:  Pt c/o lower back pain; pt states he was brushing his teeth this morning and felt a pop in his lower back. Hx of prior lower back surgery. EXAM: LUMBAR SPINE - COMPLETE 4+ VIEW COMPARISON:  CT, 03/27/2016 FINDINGS: Pedicle screws are noted at L 2, L3 and L4. There radiopaque connecting rods between the L3 and L4 pedicle screws. The orthopedic hardware appears well-seated with no evidence of loosening and unchanged from the prior CT. There is bone graft material spanning the narrowed L3-L4 and L4-L5 disc spaces, also unchanged. No acute fracture.  No spondylolisthesis. Mild loss of disc height with small endplate spurs at L 1-L2. Soft tissues are unremarkable. IMPRESSION: 1.  No fracture, spondylolisthesis or acute finding. No evidence of loosening of the orthopedic hardware. 2. Stable orthopedic hardware.  Mature fusion at L3-L4 and L4-L5. 3. No change from the prior abdomen and pelvis CT. Electronically Signed   By: Lajean Manes M.D.   On: 11/07/2016 14:16       Assessment & Plan:   Problem List Items Addressed This Visit    Anemia    Recent hgb wnl.       Chest pain    Having persistent intermittent chest pain with sob and decreased energy.  In reviewing, he had stress test in 07/2017 - negative for ischemia.  Symptoms have progressed/worsened since then.  Previous hear cath with 30% lesion - more than 10 years ago.  With risk factors and given presenting symptoms, EKG - SR with no acute ischemic changes.  After discussion, it was decided to refer back to cardiology to confirm no further cardiac w/up warranted as outlined.  Question of need for holter -- given near syncope.  Question of need for echo - given sob and pain as outlined.        Chronic back pain    Overall feels stable.  Followed by pain clinic.        DOE (dyspnea on exertion)    Previously felt to be due to deconditioning.  Has worsened recently.  Recently evaluated by cardiology.  Has lost weight and adjusted diet.  Persistent worsening symptoms.  EKG - no acute ischemic changes as outlined.  Refer back to cardiology for question of need for further cardiac evaluation given known disease and progressive symptoms.  Will also obtain cxr.  If no further cardiac w/up felt warranted, will need to purse other etiologies.        Fatigue    Feel multifactorial.  Cardiac w/up as outlined.  Recent  labs wnl.  Eating.  Check cxr.  Discussed the need for f/u with psychiatry to adjust medication if feels is not working.        Hypercholesteremia    Low cholesterol diet and exercise.  Follow lipid panel.       Relevant Medications   amLODipine (NORVASC) 5 MG tablet   Hypertension    In reviewing  blood pressures, outside blood pressure readings are mostly averaging 120-130s/70-80s.  Amlodipine dose was just adjusted to 10mg  q day.  Continue same medication regimen.  Follow pressures.        Relevant Medications   amLODipine (NORVASC) 5 MG tablet    Other Visit Diagnoses    SOB (shortness of breath)    -  Primary   Relevant Orders   EKG 12-Lead (Completed)   DG Chest 2 View (Completed)   Foot lesion       Relevant Orders   Ambulatory referral to Podiatry      I spent 45 minutes with the patient and more than 50% of the time was spent in consultation regarding the above.   Time spent obtaining history, discussing current symptoms and problems and time spent discussing further w/up and evaluation.    Einar Pheasant, MD

## 2017-04-13 NOTE — Assessment & Plan Note (Signed)
Low cholesterol diet and exercise.  Follow lipid panel.   

## 2017-04-13 NOTE — Assessment & Plan Note (Addendum)
Previously felt to be due to deconditioning.  Has worsened recently.  Recently evaluated by cardiology.  Has lost weight and adjusted diet.  Persistent worsening symptoms.  EKG - no acute ischemic changes as outlined.  Refer back to cardiology for question of need for further cardiac evaluation given known disease and progressive symptoms.  Will also obtain cxr.  If no further cardiac w/up felt warranted, will need to purse other etiologies.

## 2017-04-14 ENCOUNTER — Encounter: Payer: Self-pay | Admitting: Internal Medicine

## 2017-04-16 NOTE — Addendum Note (Signed)
Addended by: Alisa Graff on: 04/16/2017 08:51 AM   Modules accepted: Orders

## 2017-04-17 ENCOUNTER — Ambulatory Visit (INDEPENDENT_AMBULATORY_CARE_PROVIDER_SITE_OTHER): Payer: Medicare HMO

## 2017-04-17 VITALS — BP 130/88 | HR 76 | Temp 98.2°F | Resp 16 | Ht 73.0 in | Wt 228.0 lb

## 2017-04-17 DIAGNOSIS — Z0000 Encounter for general adult medical examination without abnormal findings: Secondary | ICD-10-CM | POA: Diagnosis not present

## 2017-04-17 NOTE — Patient Instructions (Addendum)
  [name] , Thank you for taking time to come for your Medicare Wellness Visit. I appreciate your ongoing commitment to your health goals. Please review the following plan we discussed and let me know if I can assist you in the future.   Follow up with Dr. Nicki Reaper as needed.    Bring a copy of your [place] and/or Living Will to be scanned into chart once completed.  Have a great day!  These are the goals we discussed: Goals    . Increase physical activity     Water aerobics Swim for exercise       This is a list of the screening recommended for you and due dates:  Health Maintenance  Topic Date Due  . HIV Screening  08/21/1977  . Tetanus Vaccine  06/11/2024  . Colon Cancer Screening  06/29/2025  . Flu Shot  Completed  .  Hepatitis C: One time screening is recommended by Center for Disease Control  (CDC) for  adults born from [birth year] through 1963.   Completed

## 2017-04-17 NOTE — Progress Notes (Addendum)
Subjective:   STEN DEMATTEO is a 57 y.o. male who presents for Medicare Annual/Subsequent preventive examination.  Review of Systems:  No ROS.  Medicare Wellness Visit. Additional risk factors are reflected in the social history.  Cardiac Risk Factors include: advanced age (>82men, >62 women);male gender;hypertension     Objective:    Vitals: BP 130/88 (BP Location: Left Arm, Patient Position: Sitting, Cuff Size: Normal)   Pulse 76   Temp 98.2 F (36.8 C) (Oral)   Resp 16   Ht 6\' 1"  (1.854 m)   Wt 228 lb (103.4 kg)   SpO2 97%   BMI 30.08 kg/m   Body mass index is 30.08 kg/m.  Advanced Directives 04/17/2017 11/07/2016 04/14/2016 03/30/2016 03/27/2016 06/30/2015 04/30/2015  Does Patient Have a Medical Advance Directive? No No No No No No Yes  Type of Advance Directive - - - - - - Press photographer;Living will  Does patient want to make changes to medical advance directive? - - - - - - No - Patient declined  Would patient like information on creating a medical advance directive? Yes (MAU/Ambulatory/Procedural Areas - Information given) - No - Patient declined No - Patient declined No - Patient declined No - patient declined information -    Tobacco Social History   Tobacco Use  Smoking Status Never Smoker  Smokeless Tobacco Never Used     Counseling given: Not Answered   Clinical Intake:  Pre-visit preparation completed: Yes  Pain : 0-10 Pain Score: 8  Pain Type: Chronic pain Pain Location: Back Pain Onset: More than a month ago Pain Frequency: Constant     Nutritional Status: BMI > 30  Obese Diabetes: No  How often do you need to have someone help you when you read instructions, pamphlets, or other written materials from your doctor or pharmacy?: 1 - Never  Interpreter Needed?: No     Past Medical History:  Diagnosis Date  . Allergy   . Anoxic brain injury ([place])   . Chronic back pain    s/p 5 back surgeries  . Coronary artery disease    Cardiac catheterization in 2007 at [facility] showed 30% stenosis in proximal LAD. No other disease. Ejection fraction was 65%  . Depression   . H/O acute pancreatitis   . Hypercholesterolemia   . Mild cognitive impairment with memory loss   . Pancreatitis    Past Surgical History:  Procedure Laterality Date  . abdomial surgery    . BACK SURGERY     multiple back surgeries (5)  . CARDIAC CATHETERIZATION     [facility]   . CHOLECYSTECTOMY  2013  . COLONOSCOPY WITH PROPOFOL N/A 06/30/2015   Procedure: COLONOSCOPY WITH PROPOFOL;  Surgeon: Lollie Sails, MD;  Location: [facility] ENDOSCOPY;  Service: Endoscopy;  Laterality: N/A;  . ESOPHAGOGASTRODUODENOSCOPY (EGD) WITH PROPOFOL N/A 03/30/2016   Procedure: ESOPHAGOGASTRODUODENOSCOPY (EGD) WITH PROPOFOL;  Surgeon: Manya Silvas, MD;  Location: [facility] ENDOSCOPY;  Service: Endoscopy;  Laterality: N/A;  . FUNCTIONAL ENDOSCOPIC SINUS SURGERY    . JOINT REPLACEMENT    . LAMINECTOMY    . LUMBAR SPINE SURGERY    . SHOULDER ARTHROSCOPY WITH ROTATOR CUFF REPAIR AND OPEN BICEPS TENODESIS Right   . SHOULDER ARTHROSCOPY WITH ROTATOR CUFF REPAIR AND OPEN BICEPS TENODESIS    . SPINE SURGERY    . TRIGGER POINT INJECTION    . VASECTOMY     With Reversal  . VEIN SURGERY Right 2013   arm   Family  History  Problem Relation Age of Onset  . Heart disease Father   . Heart attack Father   . Heart disease Mother   . Heart attack Mother   . Heart attack Brother        MI's x 2   . Hypertension Unknown   . Colon cancer Unknown   . Hypertension Sister   . Heart attack Paternal Grandfather   . Hypertension Sister    Social History   Socioeconomic History  . Marital status: Divorced    Spouse name: None  . Number of children: 3  . Years of education: None  . Highest education level: None  Social Needs  . Financial resource strain: Not hard at all  . Food insecurity - worry: Never true  . Food insecurity - inability: Never true  . Transportation needs -  medical: No  . Transportation needs - non-medical: No  Occupational History  . None  Tobacco Use  . Smoking status: Never Smoker  . Smokeless tobacco: Never Used  Substance and Sexual Activity  . Alcohol use: No    Alcohol/week: 0.0 oz  . Drug use: No  . Sexual activity: Yes  Other Topics Concern  . None  Social History Narrative  . None    Outpatient Encounter Medications as of 04/17/2017  Medication Sig  . amLODipine (NORVASC) 5 MG tablet TAKE 2 TABLETS (10 MG TOTAL) BY MOUTH ONCE DAILY  . aspirin 81 MG chewable tablet Chew 81 mg by mouth daily as needed for mild pain.  . cetirizine (ZYRTEC) 10 MG tablet Take 10 mg by mouth daily.  . diazepam (VALIUM) 5 MG tablet TAKE 5MG  BY MOUTH AT TWICE DAILY  . fluticasone (FLONASE) 50 MCG/ACT nasal spray SPRAY 2 SPRAYS INTO EACH NOSTRIL EVERY DAY  . modafinil (PROVIGIL) 200 MG tablet TAKE 1-2 TABS BY MOUTH DAILY  . naproxen (NAPROSYN) 375 MG tablet Take 1 tablet (375 mg total) by mouth 2 (two) times daily with a meal.  . ondansetron (ZOFRAN ODT) 4 MG disintegrating tablet Take 1 tablet (4 mg total) by mouth every 6 (six) hours as needed for nausea or vomiting.  . pantoprazole (PROTONIX) 40 MG tablet Take 40 mg by mouth daily.   . sildenafil (REVATIO) 20 MG tablet TAKE 4-5 TABLETS BY MOUTH DAILY AS NEEDED  . tiZANidine (ZANAFLEX) 4 MG tablet Take 4 mg by mouth at bedtime.    No facility-administered encounter medications on file as of 04/17/2017.     Activities of Daily Living In your present state of health, do you have any difficulty performing the following activities: 04/17/2017  Hearing? N  Vision? N  Difficulty concentrating or making decisions? Y  Walking or climbing stairs? Y  Dressing or bathing? N  Doing errands, shopping? N  Preparing Food and eating ? N  Using the Toilet? N  In the past six months, have you accidently leaked urine? N  Do you have problems with loss of bowel control? N  Managing your Medications? N    Managing your Finances? N  Housekeeping or managing your Housekeeping? Y  Some recent data might be hidden    Patient Care Team: Einar Pheasant, MD as PCP - General (Internal Medicine) Bary Castilla, [name], MD (General Surgery) Crecencio Mc, MD (Internal Medicine)   Assessment:   This is a routine wellness examination for Loren.  The goal of the wellness visit is to assist the patient how to close the gaps in care and create a preventative  care plan for the patient.   The roster of all physicians providing medical care to patient is listed in the Snapshot section of the chart.  Osteoporosis risk reviewed.    Safety issues reviewed; Smoke and carbon monoxide detectors in the home. No firearms or firearms locked in a safe within the home. Wears seatbelts when driving or riding with others. No violence in the home.  They do not have excessive sun exposure.  Discussed the need for sun protection: hats, long sleeves and the use of sunscreen if there is significant sun exposure.  Patient is alert, normal appearance, oriented to person/place/and time. Correctly identified the president of the [place] and recalls of 2/3 words. Performs simple calculations and can read correct time from watch face. Displays appropriate judgement.  No new identified risk were noted.  No failures at ADL's or IADL's.   BMI- discussed the importance of a healthy diet, water intake and the benefits of aerobic exercise. Educational material provided.   24 hour diet recall: Regular diet  Dental- UTD  Eye- Visual acuity not assessed per patient preference since they have regular follow up with the ophthalmologist.  Wears corrective lenses.  Sleep patterns- reports sleeping ok, wakes with back pain and feels fatigued.  Naps during the day.    Exercise Activities and Dietary recommendations    Goals    . Increase physical activity     Water aerobics Swim for exercise       Fall Risk Fall Risk   04/17/2017 04/14/2016 10/29/2015 02/24/2015  Falls in the past year? No Yes No Yes  Number falls in past yr: - 2 or more - 2 or more  Injury with Fall? - No - Yes  Risk Factor Category  - High Fall Risk - -  Risk for fall due to : - History of fall(s) - -  Risk for fall due to: Comment - Chronic back pain, L knee pain - -  Follow up - Falls prevention discussed;Education provided - -   Depression Screen PHQ 2/9 Scores 04/17/2017 04/14/2016 10/29/2015 02/24/2015  PHQ - 2 Score 0 0 0 0    Cognitive Function MMSE - Mini Mental State Exam 04/17/2017 04/14/2016  Orientation to time 5 5  Orientation to Place 5 5  Registration 3 3  Attention/ Calculation 5 5  Recall 2 3  Language- name 2 objects 2 2  Language- repeat 1 1  Language- follow 3 step command 3 3  Language- read & follow direction 1 1  Write a sentence 1 1  Copy design 1 1  Total score 29 30        Immunization History  Administered Date(s) Administered  . Influenza,inj,Quad PF,6+ Mos 10/15/2014, 10/06/2015  . Influenza-Unspecified 11/09/2012, 11/14/2013  . Tdap 06/12/2014    Screening Tests Health Maintenance  Topic Date Due  . HIV Screening  08/21/1977  . TETANUS/TDAP  06/11/2024  . COLONOSCOPY  06/29/2025  . INFLUENZA VACCINE  Completed  . Hepatitis C Screening  Completed      Plan:   End of life planning; Advanced aging; Advanced directives discussed.  No HCPOA/Living Will.  Additional information provided to help them start the conversation with family.  Copy of HCPOA/Living Will requested upon completion. Time spent on this topic is 25 minutes.  I have personally reviewed and noted the following in the patient's chart:   . Medical and social history . Use of alcohol, tobacco or illicit drugs  . Current medications and supplements .  Functional ability and status . Nutritional status . Physical activity . Advanced directives . List of other physicians . Hospitalizations, surgeries, and ER visits in previous  12 months . Vitals . Screenings to include cognitive, depression, and falls . Referrals and appointments  In addition, I have reviewed and discussed with patient certain preventive protocols, quality metrics, and best practice recommendations. A written personalized care plan for preventive services as well as general preventive health recommendations were provided to patient.     Varney Biles, LPN  05/18/4079  Reviewed above information.  Agree with assessment and plan.    Dr Nicki Reaper

## 2017-05-27 ENCOUNTER — Emergency Department
Admission: EM | Admit: 2017-05-27 | Discharge: 2017-05-27 | Disposition: A | Discharge status: Home / Self Care | Payer: Medicare HMO | Attending: Emergency Medicine | Admitting: Emergency Medicine

## 2017-05-27 ENCOUNTER — Other Ambulatory Visit: Payer: Self-pay

## 2017-05-27 ENCOUNTER — Encounter: Payer: Self-pay | Admitting: Emergency Medicine

## 2017-05-27 DIAGNOSIS — G8929 Other chronic pain: Secondary | ICD-10-CM | POA: Diagnosis not present

## 2017-05-27 DIAGNOSIS — I2510 Atherosclerotic heart disease of native coronary artery without angina pectoris: Secondary | ICD-10-CM | POA: Insufficient documentation

## 2017-05-27 DIAGNOSIS — M549 Dorsalgia, unspecified: Secondary | ICD-10-CM | POA: Diagnosis not present

## 2017-05-27 DIAGNOSIS — R1013 Epigastric pain: Secondary | ICD-10-CM

## 2017-05-27 DIAGNOSIS — R1010 Upper abdominal pain, unspecified: Secondary | ICD-10-CM | POA: Diagnosis present

## 2017-05-27 LAB — COMPREHENSIVE METABOLIC PANEL
ALBUMIN: 4.7 g/dL (ref 3.5–5.0)
ALT: 45 U/L (ref 17–63)
AST: 30 U/L (ref 15–41)
Alkaline Phosphatase: 158 U/L — ABNORMAL HIGH (ref 38–126)
Anion gap: 8 (ref 5–15)
BUN: 21 mg/dL — AB (ref 6–20)
CHLORIDE: 103 mmol/L (ref 101–111)
CO2: 26 mmol/L (ref 22–32)
Calcium: 9.2 mg/dL (ref 8.9–10.3)
Creatinine, Ser: 0.95 mg/dL (ref 0.61–1.24)
GFR calc Af Amer: >60 mL/min (ref >60)
GFR calc non Af Amer: >60 mL/min (ref >60)
GLUCOSE: 86 mg/dL (ref 65–99)
Potassium: 4 mmol/L (ref 3.5–5.1)
SODIUM: 137 mmol/L (ref 135–145)
Total Bilirubin: 0.6 mg/dL (ref 0.3–1.2)
Total Protein: 8.2 g/dL — ABNORMAL HIGH (ref 6.5–8.1)

## 2017-05-27 LAB — CBC
HEMATOCRIT: 44.3 % (ref 40.0–52.0)
Hemoglobin: 15 g/dL (ref 13.0–18.0)
MCH: 30.8 pg (ref 26.0–34.0)
MCHC: 33.8 g/dL (ref 32.0–36.0)
MCV: 91 fL (ref 80.0–100.0)
PLATELETS: 325 10*3/uL (ref 150–440)
RBC: 4.88 MIL/uL (ref 4.40–5.90)
RDW: 13.1 % (ref 11.5–14.5)
WBC: 11 10*3/uL — AB (ref 3.8–10.6)

## 2017-05-27 LAB — LIPASE, BLOOD: LIPASE: 50 U/L (ref 11–51)

## 2017-05-27 MED ORDER — ONDANSETRON 4 MG PO TBDP
4.0000 mg | ORAL_TABLET | Freq: Once | ORAL | Status: AC
  Administered 2017-05-27: 4 mg via ORAL
  Filled 2017-05-27: qty 1

## 2017-05-27 MED ORDER — ONDANSETRON 4 MG PO TBDP
4.0000 mg | ORAL_TABLET | Freq: Once | ORAL | Status: AC | PRN
  Administered 2017-05-27: 4 mg via ORAL
  Filled 2017-05-27: qty 1

## 2017-05-27 MED ORDER — HYDROMORPHONE HCL 1 MG/ML IJ SOLN
1.0000 mg | Freq: Once | INTRAMUSCULAR | Status: AC
  Administered 2017-05-27: 1 mg via INTRAMUSCULAR
  Filled 2017-05-27: qty 1

## 2017-05-27 MED ORDER — PROMETHAZINE HCL 25 MG PO TABS: 25.0000 mg | ORAL_TABLET | ORAL | 1 refills | Status: DC | PRN

## 2017-05-27 NOTE — ED Triage Notes (Signed)
Pt arrived via POV with girlfriend and daughter with reports of abdominal pain that started a couple of weeks ago.    Pt states the pain feels similar to when he had pancreatitis. Pain starts in the mid upper stomach radiating down to the right.  Pt does not have GB due to chronic pancreatitis. Pt does not drink alcohol.  Pt states he had shoulder surgery on the left last Friday and stopped taking pain meds 3 days ago.   Pt c/o nausea, weakness, and dizziness. Pt taking zofran but no relief for nausea.

## 2017-05-27 NOTE — ED Provider Notes (Signed)
[facility] Emergency Department Provider Note       Time seen: ----------------------------------------- 8:33 PM on 05/27/2017 -----------------------------------------   I have reviewed the triage vital signs and the nursing notes.  HISTORY   Chief Complaint Abdominal Pain    HPI [name] is a 57 y.o. male with a history of allergy, anoxic brain injury, chronic back pain, depression, pancreatitis, hyperlipidemia who presents to the ED for abdominal pain that started several weeks ago.  Patient states it feels similar to when he had pancreatitis in the past.  Patient describes chronic pancreatitis and he has seen multiple doctors in the past for same.  Currently he is on oxycodone at home for pain.  Pain is in the upper abdomen, he does not drink alcohol.  He had shoulder surgery last Friday and again is complaining of nausea, weakness and dizziness.  Past Medical History:  Diagnosis Date  . Allergy   . Anoxic brain injury ([place])   . Chronic back pain    s/p 5 back surgeries  . Coronary artery disease    Cardiac catheterization in 2007 at [facility] showed 30% stenosis in proximal LAD. No other disease. Ejection fraction was 65%  . Depression   . H/O acute pancreatitis   . Hypercholesterolemia   . Mild cognitive impairment with memory loss   . Pancreatitis     Patient Active Problem List   Diagnosis Date Noted  . Abdominal pain 02/15/2016  . Fatigue 02/15/2016  . Chest pain 11/01/2015  . Rash 11/01/2015  . Diverticulosis of colon without hemorrhage 07/04/2015  . Visit for suture removal 05/19/2015  . Abnormal liver function tests 05/03/2015  . Left knee pain 04/08/2015  . Complete rotator cuff rupture of left shoulder 03/26/2015  . Hypertension 03/01/2015  . Fungal infection of foot 10/18/2014  . Health care maintenance 06/16/2014  . History of pancreatitis 06/12/2014  . Sinus tachycardia 06/27/2013  . DOE (dyspnea on exertion) 06/04/2013   . Sebaceous cyst 02/05/2013  . Hyponatremia 11/04/2012  . Short-term memory loss 05/06/2012  . Anemia 05/06/2012  . Neck fullness 03/27/2012  . Chronic back pain 12/01/2011  . Hypercholesteremia 12/01/2011  . Clinical depression 07/29/2011  . Failed back syndrome of lumbar spine 07/22/2011    Past Surgical History:  Procedure Laterality Date  . abdomial surgery    . BACK SURGERY     multiple back surgeries (5)  . CARDIAC CATHETERIZATION     [facility]   . CHOLECYSTECTOMY  2013  . COLONOSCOPY WITH PROPOFOL N/A 06/30/2015   Procedure: COLONOSCOPY WITH PROPOFOL;  Surgeon: Lollie Sails, MD;  Location: [facility] ENDOSCOPY;  Service: Endoscopy;  Laterality: N/A;  . ESOPHAGOGASTRODUODENOSCOPY (EGD) WITH PROPOFOL N/A 03/30/2016   Procedure: ESOPHAGOGASTRODUODENOSCOPY (EGD) WITH PROPOFOL;  Surgeon: Manya Silvas, MD;  Location: [facility] ENDOSCOPY;  Service: Endoscopy;  Laterality: N/A;  . FUNCTIONAL ENDOSCOPIC SINUS SURGERY    . JOINT REPLACEMENT    . LAMINECTOMY    . LUMBAR SPINE SURGERY    . SHOULDER ARTHROSCOPY WITH ROTATOR CUFF REPAIR AND OPEN BICEPS TENODESIS Right   . SHOULDER ARTHROSCOPY WITH ROTATOR CUFF REPAIR AND OPEN BICEPS TENODESIS    . SPINE SURGERY    . TRIGGER POINT INJECTION    . VASECTOMY     With Reversal  . VEIN SURGERY Right 2013   arm    Allergies Penicillins and Sulfa antibiotics  Social History Social History   Tobacco Use  . Smoking status: Never Smoker  . Smokeless  tobacco: Never Used  Substance Use Topics  . Alcohol use: No    Alcohol/week: 0.0 oz  . Drug use: No   Review of Systems Constitutional: Negative for fever. Cardiovascular: Negative for chest pain. Respiratory: Negative for shortness of breath. Gastrointestinal: Positive for abdominal pain, positive for nausea Musculoskeletal: Negative for back pain. Skin: Negative for rash. Neurological: Negative for headaches, focal weakness or numbness.  All systems negative/normal/unremarkable except  as stated in the HPI  ____________________________________________   PHYSICAL EXAM:  VITAL SIGNS: ED Triage Vitals  Enc Vitals Group     BP 05/27/17 1718 138/82     Pulse Rate 05/27/17 1718 78     Resp 05/27/17 1718 18     Temp 05/27/17 1718 98 F (36.7 C)     Temp Source 05/27/17 1718 Oral     SpO2 05/27/17 1718 98 %     Weight 05/27/17 1718 223 lb (101.2 kg)     Height 05/27/17 1718 6\' 1"  (1.854 m)     Head Circumference --      Peak Flow --      Pain Score 05/27/17 1722 9     Pain Loc --      Pain Edu? --      Excl. in [place]? --    Constitutional: Alert and oriented. Well appearing and in no distress. Eyes: Conjunctivae are normal. Normal extraocular movements. Cardiovascular: Normal rate, regular rhythm. No murmurs, rubs, or gallops. Respiratory: Normal respiratory effort without tachypnea nor retractions. Breath sounds are clear and equal bilaterally. No wheezes/rales/rhonchi. Gastrointestinal: Epigastric tenderness, no rebound or guarding.  Normal bowel sounds. Musculoskeletal: Nontender with normal range of motion in extremities. No lower extremity tenderness nor edema. Neurologic:  Normal speech and language. No gross focal neurologic deficits are appreciated.  Skin:  Skin is warm, dry and intact. No rash noted. Psychiatric: Mood and affect are normal. Speech and behavior are normal.  ____________________________________________  EKG: Interpreted by me.  Sinus rhythm the rate of 80 bpm, normal PR interval, normal QRS, normal QT.  ____________________________________________  ED COURSE:  As part of my medical decision making, I reviewed the following data within the [place] History obtained from family if available, nursing notes, old chart and ekg, as well as notes from prior ED visits. Patient presented for epigastric pain, we will assess with labs and imaging as indicated at this time.   Procedures ____________________________________________    LABS (pertinent positives/negatives)  Labs Reviewed  COMPREHENSIVE METABOLIC PANEL - Abnormal; Notable for the following components:      Result Value   BUN 21 (*)    Total Protein 8.2 (*)    Alkaline Phosphatase 158 (*)    All other components within normal limits  CBC - Abnormal; Notable for the following components:   WBC 11.0 (*)    All other components within normal limits  LIPASE, BLOOD  URINALYSIS, COMPLETE (UACMP) WITH MICROSCOPIC  ____________________________________________  DIFFERENTIAL DIAGNOSIS   GERD, peptic ulcer disease, chronic pancreatitis, pancreatitis  FINAL ASSESSMENT AND PLAN  Epigastric pain   Plan: The patient had presented for epigastric pain of uncertain etiology. Patient's labs are reassuring.  Patient was given a dose of pain medicine and antiemetics here but overall he appears very well with reassuring lab work.  He is cleared for outpatient follow-up.   Laurence Aly, MD   Note: This note was generated in part or whole with voice recognition software. Voice recognition is usually quite accurate but there  are transcription errors that can and very often do occur. I apologize for any typographical errors that were not detected and corrected.     Earleen Newport, MD 05/27/17 2049

## 2017-06-05 ENCOUNTER — Other Ambulatory Visit: Payer: Self-pay | Admitting: Internal Medicine

## 2017-06-07 ENCOUNTER — Other Ambulatory Visit: Payer: Self-pay | Admitting: Internal Medicine

## 2017-09-25 ENCOUNTER — Encounter: Payer: Self-pay | Admitting: Internal Medicine

## 2017-09-26 ENCOUNTER — Ambulatory Visit (INDEPENDENT_AMBULATORY_CARE_PROVIDER_SITE_OTHER): Payer: Medicare HMO | Admitting: Internal Medicine

## 2017-09-26 ENCOUNTER — Encounter: Payer: Self-pay | Admitting: Internal Medicine

## 2017-09-26 VITALS — BP 128/82 | HR 83 | Temp 98.2°F | Resp 18 | Wt 227.0 lb

## 2017-09-26 DIAGNOSIS — I10 Essential (primary) hypertension: Secondary | ICD-10-CM | POA: Diagnosis not present

## 2017-09-26 DIAGNOSIS — E7800 Pure hypercholesterolemia, unspecified: Secondary | ICD-10-CM

## 2017-09-26 DIAGNOSIS — M545 Low back pain: Secondary | ICD-10-CM

## 2017-09-26 DIAGNOSIS — Z5181 Encounter for therapeutic drug level monitoring: Secondary | ICD-10-CM

## 2017-09-26 DIAGNOSIS — G8929 Other chronic pain: Secondary | ICD-10-CM

## 2017-09-26 DIAGNOSIS — M75122 Complete rotator cuff tear or rupture of left shoulder, not specified as traumatic: Secondary | ICD-10-CM

## 2017-09-26 DIAGNOSIS — R7989 Other specified abnormal findings of blood chemistry: Secondary | ICD-10-CM

## 2017-09-26 DIAGNOSIS — R945 Abnormal results of liver function studies: Secondary | ICD-10-CM

## 2017-09-26 DIAGNOSIS — Z8719 Personal history of other diseases of the digestive system: Secondary | ICD-10-CM

## 2017-09-26 DIAGNOSIS — D649 Anemia, unspecified: Secondary | ICD-10-CM

## 2017-09-26 NOTE — Telephone Encounter (Signed)
Left message for patient to call back so we can get him scheduled. OK to put him in the 3:00 slot today if I can get in touch with him?

## 2017-09-26 NOTE — Telephone Encounter (Signed)
Pt scheduled. Advised this would be a work in.

## 2017-09-26 NOTE — Telephone Encounter (Signed)
Ok for work in today.  

## 2017-09-26 NOTE — Progress Notes (Signed)
Patient ID: [name], male   DOB: 1960-02-03, 57 y.o.   MRN: [id number]   Subjective:    Patient ID: [name], male    DOB: 1960/06/03, 57 y.o.   MRN: [id number]  HPI  Patient here as a work in with questions regarding his ibuprofen use.  He is taking 800mg  ibuprofen - twice a day.  Taking this for his shoulder.  Seeing surgery.  Also seeing pain clinic for his back and receives trigger point injections.  Helps.  Discussed antiinflammatories.  Discussed possible side effects and risks of medication.  No acid reflux.  No abdominal pain.  Bowels moving.  Request to have labs.  Also wants lipase checked.  Also concerned regarding nails splitting.  Only one nail now.  The remainder are better.     Past Medical History:  Diagnosis Date  . Allergy   . Anoxic brain injury ([place])   . Chronic back pain    s/p 5 back surgeries  . Coronary artery disease    Cardiac catheterization in 2007 at [facility] showed 30% stenosis in proximal LAD. No other disease. Ejection fraction was 65%  . Depression   . H/O acute pancreatitis   . Hypercholesterolemia   . Mild cognitive impairment with memory loss   . Pancreatitis    Past Surgical History:  Procedure Laterality Date  . abdomial surgery    . BACK SURGERY     multiple back surgeries (5)  . CARDIAC CATHETERIZATION     [facility]   . CHOLECYSTECTOMY  2013  . COLONOSCOPY WITH PROPOFOL N/A 06/30/2015   Procedure: COLONOSCOPY WITH PROPOFOL;  Surgeon: Lollie Sails, MD;  Location: [facility] ENDOSCOPY;  Service: Endoscopy;  Laterality: N/A;  . ESOPHAGOGASTRODUODENOSCOPY (EGD) WITH PROPOFOL N/A 03/30/2016   Procedure: ESOPHAGOGASTRODUODENOSCOPY (EGD) WITH PROPOFOL;  Surgeon: Manya Silvas, MD;  Location: [facility] ENDOSCOPY;  Service: Endoscopy;  Laterality: N/A;  . FUNCTIONAL ENDOSCOPIC SINUS SURGERY    . JOINT REPLACEMENT    . LAMINECTOMY    . LUMBAR SPINE SURGERY    . SHOULDER ARTHROSCOPY WITH ROTATOR CUFF REPAIR AND OPEN BICEPS TENODESIS Right   .  SHOULDER ARTHROSCOPY WITH ROTATOR CUFF REPAIR AND OPEN BICEPS TENODESIS    . SPINE SURGERY    . TRIGGER POINT INJECTION    . VASECTOMY     With Reversal  . VEIN SURGERY Right 2013   arm   Family History  Problem Relation Age of Onset  . Heart disease Father   . Heart attack Father   . Heart disease Mother   . Heart attack Mother   . Heart attack Brother        MI's x 2   . Hypertension Unknown   . Colon cancer Unknown   . Hypertension Sister   . Heart attack Paternal Grandfather   . Hypertension Sister    Social History   Socioeconomic History  . Marital status: Divorced    Spouse name: Not on file  . Number of children: 3  . Years of education: Not on file  . Highest education level: Not on file  Occupational History  . Not on file  Social Needs  . Financial resource strain: Not hard at all  . Food insecurity:    Worry: Never true    Inability: Never true  . Transportation needs:    Medical: No    Non-medical: No  Tobacco Use  . Smoking status: Never Smoker  . Smokeless tobacco: Never Used  Substance  and Sexual Activity  . Alcohol use: No    Alcohol/week: 0.0 standard drinks  . Drug use: No  . Sexual activity: Yes  Lifestyle  . Physical activity:    Days per week: Not on file    Minutes per session: Not on file  . Stress: Not on file  Relationships  . Social connections:    Talks on phone: Not on file    Gets together: Not on file    Attends religious service: Not on file    Active member of club or organization: Not on file    Attends meetings of clubs or organizations: Not on file    Relationship status: Not on file  Other Topics Concern  . Not on file  Social History Narrative  . Not on file    Outpatient Encounter Medications as of 09/26/2017  Medication Sig  . amLODipine (NORVASC) 5 MG tablet TAKE 2 TABLETS (10 MG TOTAL) BY MOUTH ONCE DAILY  . aspirin 81 MG chewable tablet Chew 81 mg by mouth daily as needed for mild pain.  . cetirizine  (ZYRTEC) 10 MG tablet Take 10 mg by mouth daily.  . diazepam (VALIUM) 5 MG tablet TAKE 5MG  BY MOUTH AT TWICE DAILY  . fluticasone (FLONASE) 50 MCG/ACT nasal spray SPRAY 2 SPRAYS INTO EACH NOSTRIL EVERY DAY  . gabapentin (NEURONTIN) 300 MG capsule TAKE 1 OR 2 CAPSULES BY MOUTH 3 TIMES DAILY  . ibuprofen (ADVIL,MOTRIN) 800 MG tablet TAKE 1 TABLET (800 MG TOTAL) BY MOUTH 3 (THREE) TIMES DAILY WITH MEALS  . modafinil (PROVIGIL) 200 MG tablet TAKE 1-2 TABS BY MOUTH DAILY  . naproxen (NAPROSYN) 375 MG tablet Take 1 tablet (375 mg total) by mouth 2 (two) times daily with a meal.  . ondansetron (ZOFRAN ODT) 4 MG disintegrating tablet Take 1 tablet (4 mg total) by mouth every 6 (six) hours as needed for nausea or vomiting.  Marland Kitchen oxyCODONE (OXY IR/ROXICODONE) 5 MG immediate release tablet TAKE 1 OR 2 TABLETS BY MOUTH AT BEDTIME AS NEEDED  . pantoprazole (PROTONIX) 40 MG tablet Take 40 mg by mouth daily.   . promethazine (PHENERGAN) 25 MG tablet Take 1 tablet (25 mg total) by mouth every 4 (four) hours as needed for nausea or vomiting.  . sildenafil (REVATIO) 20 MG tablet TAKE 4-5 TABLETS BY MOUTH DAILY AS NEEDED  . sucralfate (CARAFATE) 1 g tablet TAKE 1 TABLET (1 G TOTAL) BY MOUTH 4 (FOUR) TIMES DAILY AS NEEDED. NEED APPT FOR MORE REFILLS  . tiZANidine (ZANAFLEX) 4 MG tablet Take 4 mg by mouth at bedtime.    No facility-administered encounter medications on file as of 09/26/2017.     Review of Systems  Constitutional: Negative for appetite change and unexpected weight change.  Respiratory: Negative for cough, chest tightness and shortness of breath.   Cardiovascular: Negative for chest pain and leg swelling.  Gastrointestinal: Negative for abdominal pain, diarrhea and nausea.  Genitourinary: Negative for difficulty urinating and dysuria.  Musculoskeletal:       Persistent back pain.  Seeing pain clinic.  Persistent shoulder pain.  Seeing surgery.    Skin: Negative for color change and rash.    Neurological: Negative for dizziness, light-headedness and headaches.  Psychiatric/Behavioral: Negative for agitation and dysphoric mood.       Objective:    Physical Exam  Constitutional: He appears well-developed and well-nourished. No distress.  HENT:  Nose: Nose normal.  Mouth/Throat: Oropharynx is clear and moist.  Neck: Neck supple.  Cardiovascular:  Normal rate and regular rhythm.  Pulmonary/Chest: Effort normal and breath sounds normal. No respiratory distress.  Abdominal: Soft. Bowel sounds are normal. There is no tenderness.  Musculoskeletal: He exhibits no edema or tenderness.  Lymphadenopathy:    He has no cervical adenopathy.  Skin: No rash noted. No erythema.  Psychiatric: He has a normal mood and affect. His behavior is normal.    BP 128/82 (BP Location: Left Arm, Patient Position: Sitting, Cuff Size: Normal)   Pulse 83   Temp 98.2 F (36.8 C) (Oral)   Resp 18   Wt 227 lb (103 kg)   SpO2 97%   BMI 29.95 kg/m  Wt Readings from Last 3 Encounters:  09/26/17 227 lb (103 kg)  05/27/17 223 lb (101.2 kg)  04/17/17 228 lb (103.4 kg)     Lab Results  Component Value Date   WBC 9.8 09/26/2017   HGB 15.7 09/26/2017   HCT 44.8 09/26/2017   PLT 356.0 09/26/2017   GLUCOSE 84 09/26/2017   CHOL 211 (H) 09/26/2017   TRIG 148.0 09/26/2017   HDL 49.40 09/26/2017   LDLCALC 132 (H) 09/26/2017   ALT 38 09/26/2017   AST 30 09/26/2017   NA 138 09/26/2017   K 4.1 09/26/2017   CL 102 09/26/2017   CREATININE 1.11 09/26/2017   BUN 15 09/26/2017   CO2 24 09/26/2017   TSH 2.18 03/28/2017   PSA 0.72 06/12/2014   INR 0.9 10/27/2011   HGBA1C 5.3 09/14/2012       Assessment & Plan:   Problem List Items Addressed This Visit    Abnormal liver function tests - Primary    Has seen GI.  Last liver panel wnl.  Recheck liver function tests.       Anemia    Follow cbc.       Relevant Orders   Ferritin (Completed)   CBC with Differential/Platelet (Completed)    Chronic back pain    Being followed by pain clinic.        Relevant Medications   ibuprofen (ADVIL,MOTRIN) 800 MG tablet   oxyCODONE (OXY IR/ROXICODONE) 5 MG immediate release tablet   Complete rotator cuff rupture of left shoulder    S/p surgery.   Continues f/u with ortho.        History of pancreatitis    Request to have lipase checked.        Relevant Orders   Lipase (Completed)   Hypercholesteremia    Low cholesterol diet and exercise.  Follow lipid panel.        Relevant Orders   Hepatic function panel (Completed)   Lipid panel (Completed)   Hypertension    Continue current medication regimen.  Follow pressures.  Follow metabolic panel.       Relevant Orders   Basic metabolic panel (Completed)   Medication monitoring encounter    On ibuprofen.  Check kidney function and liver function tests.            Einar Pheasant, MD

## 2017-09-27 ENCOUNTER — Encounter: Payer: Self-pay | Admitting: Internal Medicine

## 2017-09-27 LAB — CBC WITH DIFFERENTIAL/PLATELET
Basophils Absolute: 0.1 10*3/uL (ref 0.0–0.1)
Basophils Relative: 0.8 % (ref 0.0–3.0)
EOS PCT: 1.3 % (ref 0.0–5.0)
Eosinophils Absolute: 0.1 10*3/uL (ref 0.0–0.7)
HEMATOCRIT: 44.8 % (ref 39.0–52.0)
HEMOGLOBIN: 15.7 g/dL (ref 13.0–17.0)
LYMPHS PCT: 27.3 % (ref 12.0–46.0)
Lymphs Abs: 2.7 10*3/uL (ref 0.7–4.0)
MCHC: 34.9 g/dL (ref 30.0–36.0)
MCV: 92 fl (ref 78.0–100.0)
MONO ABS: 0.7 10*3/uL (ref 0.1–1.0)
Monocytes Relative: 6.9 % (ref 3.0–12.0)
Neutro Abs: 6.2 10*3/uL (ref 1.4–7.7)
Neutrophils Relative %: 63.7 % (ref 43.0–77.0)
Platelets: 356 10*3/uL (ref 150.0–400.0)
RBC: 4.87 Mil/uL (ref 4.22–5.81)
RDW: 13 % (ref 11.5–15.5)
WBC: 9.8 10*3/uL (ref 4.0–10.5)

## 2017-09-27 LAB — BASIC METABOLIC PANEL
BUN: 15 mg/dL (ref 6–23)
CHLORIDE: 102 meq/L (ref 96–112)
CO2: 24 mEq/L (ref 19–32)
Calcium: 9.8 mg/dL (ref 8.4–10.5)
Creatinine, Ser: 1.11 mg/dL (ref 0.40–1.50)
GFR: 73.07 mL/min (ref >60.00)
Glucose, Bld: 84 mg/dL (ref 70–99)
POTASSIUM: 4.1 meq/L (ref 3.5–5.1)
SODIUM: 138 meq/L (ref 135–145)

## 2017-09-27 LAB — LIPID PANEL
CHOL/HDL RATIO: 4
CHOLESTEROL: 211 mg/dL — AB (ref 0–200)
HDL: 49.4 mg/dL (ref >39.00)
LDL CALC: 132 mg/dL — AB (ref 0–99)
NonHDL: 161.31
Triglycerides: 148 mg/dL (ref 0.0–149.0)
VLDL: 29.6 mg/dL (ref 0.0–40.0)

## 2017-09-27 LAB — LIPASE: LIPASE: 29 U/L (ref 11.0–59.0)

## 2017-09-27 LAB — HEPATIC FUNCTION PANEL
ALT: 38 U/L (ref 0–53)
AST: 30 U/L (ref 0–37)
Albumin: 4.8 g/dL (ref 3.5–5.2)
Alkaline Phosphatase: 114 U/L (ref 39–117)
BILIRUBIN DIRECT: 0.1 mg/dL (ref 0.0–0.3)
BILIRUBIN TOTAL: 0.6 mg/dL (ref 0.2–1.2)
Total Protein: 8.3 g/dL (ref 6.0–8.3)

## 2017-09-27 LAB — FERRITIN: FERRITIN: 34.3 ng/mL (ref 22.0–322.0)

## 2017-10-01 ENCOUNTER — Encounter: Payer: Self-pay | Admitting: Internal Medicine

## 2017-10-01 DIAGNOSIS — Z5181 Encounter for therapeutic drug level monitoring: Secondary | ICD-10-CM | POA: Insufficient documentation

## 2017-10-01 NOTE — Assessment & Plan Note (Signed)
Low cholesterol diet and exercise.  Follow lipid panel.   

## 2017-10-01 NOTE — Assessment & Plan Note (Signed)
Continue current medication regimen.  Follow pressures.  Follow metabolic panel.  

## 2017-10-01 NOTE — Assessment & Plan Note (Signed)
Request to have lipase checked.

## 2017-10-01 NOTE — Assessment & Plan Note (Signed)
Follow cbc.  

## 2017-10-01 NOTE — Assessment & Plan Note (Signed)
Has seen GI.  Last liver panel wnl.  Recheck liver function tests.

## 2017-10-01 NOTE — Assessment & Plan Note (Signed)
S/p surgery.   Continues f/u with ortho.

## 2017-10-01 NOTE — Assessment & Plan Note (Signed)
On ibuprofen.  Check kidney function and liver function tests.

## 2017-10-01 NOTE — Assessment & Plan Note (Signed)
Being followed by pain clinic.   

## 2017-10-09 ENCOUNTER — Encounter: Payer: Self-pay | Admitting: Internal Medicine

## 2017-10-09 ENCOUNTER — Ambulatory Visit: Payer: Self-pay | Admitting: Internal Medicine

## 2017-10-09 NOTE — Telephone Encounter (Signed)
Patient was triaged by [facility] nurse  and informed to go to ED.

## 2017-10-09 NOTE — Telephone Encounter (Signed)
LMTCB

## 2017-10-09 NOTE — Telephone Encounter (Signed)
See triage encounter from 10/09/17

## 2017-10-09 NOTE — Telephone Encounter (Signed)
Outgoing call to patient, who complains of Severe dizziness. States his eyesight is off, reports lightheadedness. Rates it severe.  Onset was Thursday through the present.  Denies any aggravating factors. Does not know what may be causing the dizziness. States he has Weakness  Dizziness and nauseated. States he almost passed out.  Feels drained,weak . Cant sleep.also reports numbness of left arm.    Per protocol.  Recommended he go to ED.  States he has to get his Daughter  To his Mothers house.  Then ask a friend for a ride.  Had a Flu shot Friday also.     Reason for Disposition . SEVERE dizziness (vertigo) (e.g., unable to walk without assistance)  Answer Assessment - Initial Assessment Questions 1. DESCRIPTION: "Describe your dizziness."     *eyesight off light headed don't have appetite 2. VERTIGO: "Do you feel like either you or the room is spinning or tilting?"      yea 3. LIGHTHEADED: "Do you feel lightheaded?" (e.g., somewhat faint, woozy, weak upon standing)     yea 4. SEVERITY: "How bad is it?"  "Can you walk?"   - MILD - Feels unsteady but walking normally.   - MODERATE - Feels very unsteady when walking, but not falling; interferes with normal activities (e.g., school, work) .   - SEVERE - Unable to walk without falling (requires assistance).     Severe 5. ONSET:  "When did the dizziness begin?"     Thursday now 6. AGGRAVATING FACTORS: "Does anything make it worse?" (e.g., standing, change in head position)     no 7. CAUSE: "What do you think is causing the dizziness?"     Don't know 8. RECURRENT SYMPTOM: "Have you had dizziness before?" If so, ask: "When was the last time?" "What happened that time?"   "Not like this" 9. OTHER SYMPTOMS: "Do you have any other symptoms?" (e.g., headache, weakness, numbness, vomiting, earache)    Weakness dizzy nauseated  10. PREGNANCY: "Is there any chance you are pregnant?" "When was your last menstrual period?"       na  Protocols used:  DIZZINESS - VERTIGO-A-AH

## 2017-10-09 NOTE — Telephone Encounter (Signed)
Need to know what specific symptoms he is having.  Message states does not feel well.  Also, I reviewed his attachment.  I can see one blood pressure reading of 90s/55.  If he is continually getting reading this low, I would recommend decreasing the amlodipine to 1/2 5mg  tablet daily.  Monitor blood pressures.  If acute symptoms, needs to be evaluated.

## 2017-10-09 NOTE — Telephone Encounter (Signed)
See other my chart message.  Also, if he has noticed a change in how he feels since making medication change, I recommend making psychiatrist aware.

## 2017-10-09 NOTE — Telephone Encounter (Signed)
Patient called and advised of the note below by Dr. Nicki Reaper, patient says he has been taking amlodipine 5 mg for 2 weeks already and his BP is still low. Hie says the symptoms he's having: "dizzy, weak, diarrhea since Thursday, grabbing hold to walk, eye sight blurry off and on." I reiterated the recommendation from the previous NT to go to the ED for evaluation. He says "I'm just going to wait and see how I feel. I've been able to keep some chicken noodle soup down without throwing up." I asked about urination, he says "I've been urinating fine. I've been drinking Gatorade." I advised someone from the office will call tomorrow if there are any more recommendations from Dr. Nicki Reaper.

## 2017-10-10 ENCOUNTER — Other Ambulatory Visit: Payer: Self-pay

## 2017-10-10 ENCOUNTER — Emergency Department: Payer: Medicare HMO

## 2017-10-10 ENCOUNTER — Emergency Department
Admission: EM | Admit: 2017-10-10 | Discharge: 2017-10-10 | Disposition: A | Discharge status: Home / Self Care | Payer: Medicare HMO | Attending: Emergency Medicine | Admitting: Emergency Medicine

## 2017-10-10 ENCOUNTER — Encounter: Payer: Self-pay | Admitting: Emergency Medicine

## 2017-10-10 DIAGNOSIS — Y829 Unspecified medical devices associated with adverse incidents: Secondary | ICD-10-CM | POA: Diagnosis not present

## 2017-10-10 DIAGNOSIS — I10 Essential (primary) hypertension: Secondary | ICD-10-CM | POA: Diagnosis not present

## 2017-10-10 DIAGNOSIS — T50905A Adverse effect of unspecified drugs, medicaments and biological substances, initial encounter: Secondary | ICD-10-CM

## 2017-10-10 DIAGNOSIS — Z7982 Long term (current) use of aspirin: Secondary | ICD-10-CM | POA: Insufficient documentation

## 2017-10-10 DIAGNOSIS — Z79899 Other long term (current) drug therapy: Secondary | ICD-10-CM | POA: Diagnosis not present

## 2017-10-10 DIAGNOSIS — I259 Chronic ischemic heart disease, unspecified: Secondary | ICD-10-CM | POA: Diagnosis not present

## 2017-10-10 DIAGNOSIS — R42 Dizziness and giddiness: Secondary | ICD-10-CM | POA: Diagnosis not present

## 2017-10-10 DIAGNOSIS — T887XXA Unspecified adverse effect of drug or medicament, initial encounter: Secondary | ICD-10-CM | POA: Insufficient documentation

## 2017-10-10 DIAGNOSIS — R079 Chest pain, unspecified: Secondary | ICD-10-CM | POA: Diagnosis present

## 2017-10-10 LAB — BASIC METABOLIC PANEL
Anion gap: 5 (ref 5–15)
BUN: 9 mg/dL (ref 6–20)
CALCIUM: 9 mg/dL (ref 8.9–10.3)
CO2: 25 mmol/L (ref 22–32)
CREATININE: 1.05 mg/dL (ref 0.61–1.24)
Chloride: 109 mmol/L (ref 98–111)
GLUCOSE: 100 mg/dL — AB (ref 70–99)
Potassium: 4 mmol/L (ref 3.5–5.1)
Sodium: 139 mmol/L (ref 135–145)

## 2017-10-10 LAB — CBC
HCT: 41 % (ref 40.0–52.0)
Hemoglobin: 14.5 g/dL (ref 13.0–18.0)
MCH: 32.4 pg (ref 26.0–34.0)
MCHC: 35.3 g/dL (ref 32.0–36.0)
MCV: 91.8 fL (ref 80.0–100.0)
Platelets: 257 10*3/uL (ref 150–440)
RBC: 4.46 MIL/uL (ref 4.40–5.90)
RDW: 13 % (ref 11.5–14.5)
WBC: 6.4 10*3/uL (ref 3.8–10.6)

## 2017-10-10 LAB — TROPONIN I

## 2017-10-10 MED ORDER — ONDANSETRON HCL 4 MG/2ML IJ SOLN
4.0000 mg | Freq: Once | INTRAMUSCULAR | Status: AC
  Administered 2017-10-10: 4 mg via INTRAVENOUS
  Filled 2017-10-10: qty 2

## 2017-10-10 MED ORDER — SODIUM CHLORIDE 0.9 % IV SOLN
1000.0000 mL | Freq: Once | INTRAVENOUS | Status: AC
  Administered 2017-10-10: 1000 mL via INTRAVENOUS

## 2017-10-10 NOTE — Telephone Encounter (Signed)
If blood pressure low and vision changes with staggering, needs evaluation and agree with ER evaluation.

## 2017-10-10 NOTE — Telephone Encounter (Signed)
Patient was evaluated at the ER today

## 2017-10-10 NOTE — ED Provider Notes (Signed)
[facility] Emergency Department Provider Note   ____________________________________________    I have reviewed the triage vital signs and the nursing notes.   HISTORY  Chief Complaint Dizziness, nausea    HPI [name] is a 57 y.o. male who presents with complaints of dizziness and nausea fatigue and paresthesias over the last 4 days.  Patient reports he also started Intuniv which he thinks may be to blame for this.  He states that he has had intermittent mild to moderate headaches.  A sensation of vertigo and significant fatigue.  Is also had intermittent tingling in his left arm as well as tingling in the left side of his face.  Denies chest pain.  Mild nausea.  No muscle weakness.  Has not taken anything for this  Past Medical History:  Diagnosis Date  . Allergy   . Anoxic brain injury ([place])   . Chronic back pain    s/p 5 back surgeries  . Coronary artery disease    Cardiac catheterization in 2007 at [facility] showed 30% stenosis in proximal LAD. No other disease. Ejection fraction was 65%  . Depression   . H/O acute pancreatitis   . Hypercholesterolemia   . Mild cognitive impairment with memory loss   . Pancreatitis     Patient Active Problem List   Diagnosis Date Noted  . Medication monitoring encounter 10/01/2017  . Abdominal pain 02/15/2016  . Fatigue 02/15/2016  . Chest pain 11/01/2015  . Rash 11/01/2015  . Diverticulosis of colon without hemorrhage 07/04/2015  . Visit for suture removal 05/19/2015  . Abnormal liver function tests 05/03/2015  . Left knee pain 04/08/2015  . Complete rotator cuff rupture of left shoulder 03/26/2015  . Hypertension 03/01/2015  . Fungal infection of foot 10/18/2014  . Health care maintenance 06/16/2014  . History of pancreatitis 06/12/2014  . Sinus tachycardia 06/27/2013  . DOE (dyspnea on exertion) 06/04/2013  . Sebaceous cyst 02/05/2013  . Hyponatremia 11/04/2012  . Short-term memory loss  05/06/2012  . Anemia 05/06/2012  . Neck fullness 03/27/2012  . Chronic back pain 12/01/2011  . Hypercholesteremia 12/01/2011  . Clinical depression 07/29/2011  . Failed back syndrome of lumbar spine 07/22/2011    Past Surgical History:  Procedure Laterality Date  . abdomial surgery    . BACK SURGERY     multiple back surgeries (5)  . CARDIAC CATHETERIZATION     [facility]   . CHOLECYSTECTOMY  2013  . COLONOSCOPY WITH PROPOFOL N/A 06/30/2015   Procedure: COLONOSCOPY WITH PROPOFOL;  Surgeon: Lollie Sails, MD;  Location: [facility] ENDOSCOPY;  Service: Endoscopy;  Laterality: N/A;  . ESOPHAGOGASTRODUODENOSCOPY (EGD) WITH PROPOFOL N/A 03/30/2016   Procedure: ESOPHAGOGASTRODUODENOSCOPY (EGD) WITH PROPOFOL;  Surgeon: Manya Silvas, MD;  Location: [facility] ENDOSCOPY;  Service: Endoscopy;  Laterality: N/A;  . FUNCTIONAL ENDOSCOPIC SINUS SURGERY    . JOINT REPLACEMENT    . LAMINECTOMY    . LUMBAR SPINE SURGERY    . SHOULDER ARTHROSCOPY WITH ROTATOR CUFF REPAIR AND OPEN BICEPS TENODESIS Right   . SHOULDER ARTHROSCOPY WITH ROTATOR CUFF REPAIR AND OPEN BICEPS TENODESIS    . SPINE SURGERY    . TRIGGER POINT INJECTION    . VASECTOMY     With Reversal  . VEIN SURGERY Right 2013   arm    Prior to Admission medications   Medication Sig Start Date End Date Taking? Authorizing Provider  amLODipine (NORVASC) 5 MG tablet Take 10 mg by mouth daily.  Yes [provider]  aspirin 81 MG chewable tablet Chew 81 mg by mouth daily as needed for moderate pain.    Yes [provider]  gabapentin (NEURONTIN) 300 MG capsule Take 600 mg by mouth at bedtime.    Yes [provider]  guanFACINE (INTUNIV) 2 MG TB24 ER tablet Take 2 mg by mouth daily. 10/05/17  Yes [provider]  ibuprofen (ADVIL,MOTRIN) 800 MG tablet Take 800 mg by mouth 2 (two) times daily.    Yes [provider]  modafinil (PROVIGIL) 200 MG tablet Take 400 mg by mouth daily.    Yes [provider]    oxyCODONE (OXY IR/ROXICODONE) 5 MG immediate release tablet Take 5-10 mg by mouth at bedtime as needed for moderate pain.    Yes [provider]  pantoprazole (PROTONIX) 40 MG tablet Take 40 mg by mouth daily.  12/01/15  Yes [provider]  promethazine (PHENERGAN) 25 MG tablet Take 1 tablet (25 mg total) by mouth every 4 (four) hours as needed for nausea or vomiting. 05/27/17  Yes Earleen Newport, MD  sildenafil (REVATIO) 20 MG tablet Take 80-100 mg by mouth daily as needed (for hbp/ed).    Yes [provider]  sucralfate (CARAFATE) 1 g tablet Take 1 g by mouth daily.    Yes [provider]  tiZANidine (ZANAFLEX) 4 MG tablet Take 4 mg by mouth at bedtime.    Yes [provider]     Allergies Penicillins and Sulfa antibiotics  Family History  Problem Relation Age of Onset  . Heart disease Father   . Heart attack Father   . Heart disease Mother   . Heart attack Mother   . Heart attack Brother        MI's x 2   . Hypertension Unknown   . Colon cancer Unknown   . Hypertension Sister   . Heart attack Paternal Grandfather   . Hypertension Sister     Social History Social History   Tobacco Use  . Smoking status: Never Smoker  . Smokeless tobacco: Never Used  Substance Use Topics  . Alcohol use: No    Alcohol/week: 0.0 standard drinks  . Drug use: No    Review of Systems  Constitutional: Fatigue as above Eyes: Occasional blurry vision ENT: No sore throat. Cardiovascular: Denies chest pain. Respiratory: Denies shortness of breath. Gastrointestinal: No abdominal pain.  Nausea as above Genitourinary: Negative for dysuria. Musculoskeletal: Negative for back pain. Skin: Negative for rash. Neurological: As above   ____________________________________________   PHYSICAL EXAM:  VITAL SIGNS: ED Triage Vitals  Enc Vitals Group     BP 10/10/17 0954 134/79     Pulse Rate 10/10/17 0954 68     Resp 10/10/17 0954 18     Temp  10/10/17 0954 98 F (36.7 C)     Temp Source 10/10/17 0954 Oral     SpO2 10/10/17 0954 98 %     Weight 10/10/17 0951 103 kg (227 lb)     Height 10/10/17 0951 1.854 m (6\' 1" )     Head Circumference --      Peak Flow --      Pain Score 10/10/17 0951 6     Pain Loc --      Pain Edu? --      Excl. in [place]? --     Constitutional: Alert and oriented. No acute distress. Pleasant and interactive Eyes: Conjunctivae are normal.  EOMI, PERRLA Head: Atraumatic. Nose:  No congestion/rhinnorhea. Mouth/Throat: Mucous membranes are moist.   Neck:  Painless ROM Cardiovascular: Normal rate, regular rhythm. Grossly normal heart sounds.  Good peripheral circulation. Respiratory: Normal respiratory effort.  No retractions. Lungs CTAB. Gastrointestinal: Soft and nontender. No distention.  No CVA tenderness. Genitourinary: deferred Musculoskeletal: No lower extremity tenderness nor edema.  Warm and well perfused Neurologic:  Normal speech and language. No gross focal neurologic deficits are appreciated.  Cranial nerves II through XII are normal Skin:  Skin is warm, dry and intact. No rash noted. Psychiatric: Mood and affect are normal. Speech and behavior are normal.  ____________________________________________   LABS (all labs ordered are listed, but only abnormal results are displayed)  Labs Reviewed  BASIC METABOLIC PANEL - Abnormal; Notable for the following components:      Result Value   Glucose, Bld 100 (*)    All other components within normal limits  CBC  TROPONIN I   ____________________________________________  EKG  ED ECG REPORT I, Lavonia Drafts, the attending physician, personally viewed and interpreted this ECG.  Date: 10/10/2017  Rhythm: normal sinus rhythm QRS Axis: normal Intervals: normal ST/T Wave abnormalities: normal Narrative Interpretation: no evidence of acute ischemia  ____________________________________________  RADIOLOGY  Chest x-ray  unremarkable ____________________________________________   PROCEDURES  Procedure(s) performed: No  Procedures   Critical Care performed: No ____________________________________________   INITIAL IMPRESSION / ASSESSMENT AND PLAN / ED COURSE  Pertinent labs & imaging results that were available during my care of the patient were reviewed by me and considered in my medical decision making (see chart for details).  Patient overall well-appearing in no acute distress, vital signs unremarkable.  Neuro exam is normal.  Unclear cause of multiple symptoms certainly medication side effects could be to blame.  However given his paresthesias and vertigo will send for CT head, check labs give IV fluids, IV Zofran and reevaluate.  CT head is unremarkable.  Lab work is unremarkable.  Patient feels better after IV fluids.  No further dizziness.  I do suspect this medication reaction given primary complaint of dizziness  Have asked him to hold his intuniv and follow up with pcp   ____________________________________________   FINAL CLINICAL IMPRESSION(S) / ED DIAGNOSES  Final diagnoses:  Dizziness  Medication side effect, initial encounter        Note:  This document was prepared using Dragon voice recognition software and may include unintentional dictation errors.    Lavonia Drafts, MD 10/10/17 [phone]

## 2017-10-10 NOTE — ED Notes (Signed)
Pt in CT.

## 2017-10-10 NOTE — ED Triage Notes (Signed)
Pt arrived via POV with reports of chest pain since THursday, pt reports he also has been having tingling in the left face off and on as well.  Pt c/o dizziness and weakness.  Pt recently started on Guafacine which is new for him. Pt states he did not take any medication today.  Pt also states he had his flu shot recently.  Pt also reports shortness of breath and back pain.  Pt has family hx of heart attacks.

## 2017-10-10 NOTE — Telephone Encounter (Signed)
FYI, I sent you another message on this patient as well

## 2017-10-10 NOTE — Telephone Encounter (Signed)
Patient called in after hours and they recommended he go to the ED but patient declined

## 2018-01-04 ENCOUNTER — Ambulatory Visit (INDEPENDENT_AMBULATORY_CARE_PROVIDER_SITE_OTHER): Payer: Medicare HMO | Admitting: Internal Medicine

## 2018-01-04 VITALS — BP 138/92 | HR 80 | Temp 97.8°F | Resp 16 | Wt 236.0 lb

## 2018-01-04 DIAGNOSIS — D649 Anemia, unspecified: Secondary | ICD-10-CM | POA: Diagnosis not present

## 2018-01-04 DIAGNOSIS — Z0001 Encounter for general adult medical examination with abnormal findings: Secondary | ICD-10-CM

## 2018-01-04 DIAGNOSIS — E7800 Pure hypercholesterolemia, unspecified: Secondary | ICD-10-CM

## 2018-01-04 DIAGNOSIS — L989 Disorder of the skin and subcutaneous tissue, unspecified: Secondary | ICD-10-CM

## 2018-01-04 DIAGNOSIS — M545 Low back pain, unspecified: Secondary | ICD-10-CM

## 2018-01-04 DIAGNOSIS — Z125 Encounter for screening for malignant neoplasm of prostate: Secondary | ICD-10-CM

## 2018-01-04 DIAGNOSIS — Z8719 Personal history of other diseases of the digestive system: Secondary | ICD-10-CM

## 2018-01-04 DIAGNOSIS — R945 Abnormal results of liver function studies: Secondary | ICD-10-CM

## 2018-01-04 DIAGNOSIS — G8929 Other chronic pain: Secondary | ICD-10-CM

## 2018-01-04 DIAGNOSIS — I10 Essential (primary) hypertension: Secondary | ICD-10-CM

## 2018-01-04 DIAGNOSIS — R7989 Other specified abnormal findings of blood chemistry: Secondary | ICD-10-CM

## 2018-01-04 DIAGNOSIS — Z0000 Encounter for general adult medical examination without abnormal findings: Secondary | ICD-10-CM

## 2018-01-04 MED ORDER — AMLODIPINE BESYLATE 5 MG PO TABS: 5.0000 mg | ORAL_TABLET | Freq: Two times a day (BID) | ORAL | 2 refills | Status: DC

## 2018-01-04 NOTE — Progress Notes (Signed)
Patient ID: [name], male   DOB: 1960-03-04, 57 y.o.   MRN: [id number]   Subjective:    Patient ID: [name], male    DOB: Apr 20, 1960, 58 y.o.   MRN: [id number]  HPI  Patient here for his physical exam.  Has chronic back pain.  Sees neurosurgery.  Receives trigger point injections q 3 weeks.  They prescribe his pain medication.  States he tries to stay active.  Not exercising as much. Discussed diet and exercise.  No chest pain. No sob.  No acid reflux.  No increased abdominal pain.  Taking PPI and carafate. Seeing GI.  Blood pressure is elevated.  On amlodipine q day.  Discussed increasing the dose. Previously when he was on 10mg  of amlodipine, he had problems with low blood pressure.  States this only occurred after the addition of guanfacine.  Blood pressure was ok before starting the guanfacine.  Persistent skin lesion.  Request dermatology referral.  Concerned his testicles are "hanging down more".  No nodules.     Past Medical History:  Diagnosis Date  . Allergy   . Anoxic brain injury ([place])   . Chronic back pain    s/p 5 back surgeries  . Coronary artery disease    Cardiac catheterization in 2007 at [facility] showed 30% stenosis in proximal LAD. No other disease. Ejection fraction was 65%  . Depression   . H/O acute pancreatitis   . Hypercholesterolemia   . Mild cognitive impairment with memory loss   . Pancreatitis    Past Surgical History:  Procedure Laterality Date  . abdomial surgery    . BACK SURGERY     multiple back surgeries (5)  . CARDIAC CATHETERIZATION     [facility]   . CHOLECYSTECTOMY  2013  . COLONOSCOPY WITH PROPOFOL N/A 06/30/2015   Procedure: COLONOSCOPY WITH PROPOFOL;  Surgeon: Lollie Sails, MD;  Location: [facility] ENDOSCOPY;  Service: Endoscopy;  Laterality: N/A;  . ESOPHAGOGASTRODUODENOSCOPY (EGD) WITH PROPOFOL N/A 03/30/2016   Procedure: ESOPHAGOGASTRODUODENOSCOPY (EGD) WITH PROPOFOL;  Surgeon: Manya Silvas, MD;  Location: [facility] ENDOSCOPY;  Service:  Endoscopy;  Laterality: N/A;  . FUNCTIONAL ENDOSCOPIC SINUS SURGERY    . JOINT REPLACEMENT    . LAMINECTOMY    . LUMBAR SPINE SURGERY    . SHOULDER ARTHROSCOPY WITH ROTATOR CUFF REPAIR AND OPEN BICEPS TENODESIS Right   . SHOULDER ARTHROSCOPY WITH ROTATOR CUFF REPAIR AND OPEN BICEPS TENODESIS    . SPINE SURGERY    . TRIGGER POINT INJECTION    . VASECTOMY     With Reversal  . VEIN SURGERY Right 2013   arm   Family History  Problem Relation Age of Onset  . Heart disease Father   . Heart attack Father   . Heart disease Mother   . Heart attack Mother   . Heart attack Brother        MI's x 2   . Hypertension Unknown   . Colon cancer Unknown   . Hypertension Sister   . Heart attack Paternal Grandfather   . Hypertension Sister    Social History   Socioeconomic History  . Marital status: Divorced    Spouse name: Not on file  . Number of children: 3  . Years of education: Not on file  . Highest education level: Not on file  Occupational History  . Not on file  Social Needs  . Financial resource strain: Not hard at all  . Food insecurity:    Worry:  Never true    Inability: Never true  . Transportation needs:    Medical: No    Non-medical: No  Tobacco Use  . Smoking status: Never Smoker  . Smokeless tobacco: Never Used  Substance and Sexual Activity  . Alcohol use: No    Alcohol/week: 0.0 standard drinks  . Drug use: No  . Sexual activity: Yes  Lifestyle  . Physical activity:    Days per week: Not on file    Minutes per session: Not on file  . Stress: Not on file  Relationships  . Social connections:    Talks on phone: Not on file    Gets together: Not on file    Attends religious service: Not on file    Active member of club or organization: Not on file    Attends meetings of clubs or organizations: Not on file    Relationship status: Not on file  Other Topics Concern  . Not on file  Social History Narrative  . Not on file    Outpatient Encounter  Medications as of 01/04/2018  Medication Sig  . amLODipine (NORVASC) 5 MG tablet Take 1 tablet (5 mg total) by mouth 2 (two) times daily.  Marland Kitchen aspirin 81 MG chewable tablet Chew 81 mg by mouth daily as needed for moderate pain.   Marland Kitchen gabapentin (NEURONTIN) 300 MG capsule Take 600 mg by mouth at bedtime.   Marland Kitchen ibuprofen (ADVIL,MOTRIN) 800 MG tablet Take 800 mg by mouth 2 (two) times daily.   . modafinil (PROVIGIL) 200 MG tablet Take 400 mg by mouth daily.   . naproxen (NAPROSYN) 500 MG tablet Take 500 mg by mouth 2 (two) times daily with a meal.  . oxyCODONE (OXY IR/ROXICODONE) 5 MG immediate release tablet Take 5-10 mg by mouth at bedtime as needed for moderate pain.   . pantoprazole (PROTONIX) 40 MG tablet Take 40 mg by mouth daily.   . promethazine (PHENERGAN) 25 MG tablet Take 1 tablet (25 mg total) by mouth every 4 (four) hours as needed for nausea or vomiting.  . sildenafil (REVATIO) 20 MG tablet Take 80-100 mg by mouth daily as needed (for hbp/ed).   . sucralfate (CARAFATE) 1 g tablet Take 1 g by mouth daily.   Marland Kitchen tiZANidine (ZANAFLEX) 4 MG tablet Take 4 mg by mouth at bedtime.   . [DISCONTINUED] amLODipine (NORVASC) 5 MG tablet Take 10 mg by mouth daily.   . [DISCONTINUED] guanFACINE (INTUNIV) 2 MG TB24 ER tablet Take 2 mg by mouth daily.   No facility-administered encounter medications on file as of 01/04/2018.     Review of Systems  Constitutional: Negative for appetite change and unexpected weight change.  HENT: Negative for congestion and sinus pressure.   Eyes: Negative for pain and visual disturbance.  Respiratory: Negative for cough, chest tightness and shortness of breath.   Cardiovascular: Negative for chest pain, palpitations and leg swelling.  Gastrointestinal: Negative for abdominal pain, diarrhea, nausea and vomiting.  Genitourinary: Negative for difficulty urinating and dysuria.  Musculoskeletal: Positive for back pain. Negative for joint swelling.       Chronic back pain.     Skin: Negative for color change and rash.  Neurological: Negative for dizziness, light-headedness and headaches.  Hematological: Negative for adenopathy. Does not bruise/bleed easily.  Psychiatric/Behavioral: Negative for agitation and dysphoric mood.       Objective:    Physical Exam  Constitutional: He is oriented to person, place, and time. He appears well-developed and well-nourished. No  distress.  HENT:  Head: Normocephalic and atraumatic.  Nose: Nose normal.  Mouth/Throat: Oropharynx is clear and moist. No oropharyngeal exudate.  Eyes: Conjunctivae are normal. Right eye exhibits no discharge. Left eye exhibits no discharge.  Neck: Neck supple. No thyromegaly present.  Cardiovascular: Normal rate and regular rhythm.  Pulmonary/Chest: Breath sounds normal. No respiratory distress. He has no wheezes.  Abdominal: Soft. Bowel sounds are normal. There is no tenderness.  Genitourinary:  Genitourinary Comments: Testicular exam - normal descended testicles.  No nodules appreciated.    Musculoskeletal: He exhibits no edema or tenderness.  Lymphadenopathy:    He has no cervical adenopathy.  Neurological: He is alert and oriented to person, place, and time.  Skin: No rash noted. No erythema.  Psychiatric: He has a normal mood and affect. His behavior is normal.    BP (!) 138/92 (BP Location: Left Arm, Patient Position: Sitting, Cuff Size: Large)   Pulse 80   Temp 97.8 F (36.6 C) (Oral)   Resp 16   Wt 236 lb (107 kg)   SpO2 98%   BMI 31.14 kg/m  Wt Readings from Last 3 Encounters:  01/04/18 236 lb (107 kg)  10/10/17 227 lb (103 kg)  09/26/17 227 lb (103 kg)     Lab Results  Component Value Date   WBC 6.4 10/10/2017   HGB 14.5 10/10/2017   HCT 41.0 10/10/2017   PLT 257 10/10/2017   GLUCOSE 100 (H) 10/10/2017   CHOL 211 (H) 09/26/2017   TRIG 148.0 09/26/2017   HDL 49.40 09/26/2017   LDLCALC 132 (H) 09/26/2017   ALT 38 09/26/2017   AST 30 09/26/2017   NA 139  10/10/2017   K 4.0 10/10/2017   CL 109 10/10/2017   CREATININE 1.05 10/10/2017   BUN 9 10/10/2017   CO2 25 10/10/2017   TSH 2.18 03/28/2017   PSA 0.72 06/12/2014   INR 0.9 10/27/2011   HGBA1C 5.3 09/14/2012    Dg Chest 2 View  Result Date: 10/10/2017 CLINICAL DATA:  Chest pain. Intermittent facial tingling, dizziness, and weakness. EXAM: CHEST - 2 VIEW COMPARISON:  04/13/2017 FINDINGS: The cardiomediastinal silhouette is unchanged and within normal limits. There is mild elevation of the right hemidiaphragm. The lungs are clear. No pleural effusion or pneumothorax is identified. No acute osseous abnormality is seen. IMPRESSION: No active cardiopulmonary disease. Electronically Signed   By: Logan Bores M.D.   On: 10/10/2017 10:18   Ct Head Wo Contrast  Result Date: 10/10/2017 CLINICAL DATA:  Dizziness and weakness with intermittent left facial tingling. EXAM: CT HEAD WITHOUT CONTRAST TECHNIQUE: Contiguous axial images were obtained from the base of the skull through the vertex without intravenous contrast. COMPARISON:  CT head dated May 03, 2012. FINDINGS: Brain: No evidence of acute infarction, hemorrhage, hydrocephalus, extra-axial collection or mass lesion/mass effect. Vascular: No hyperdense vessel or unexpected calcification. Skull: Normal. Negative for fracture or focal lesion. Sinuses/Orbits: No acute finding. Other: None. IMPRESSION: 1. Normal noncontrast head CT. Electronically Signed   By: [name] Dubin M.D.   On: 10/10/2017 11:21       Assessment & Plan:   Problem List Items Addressed This Visit    Abnormal liver function tests    Has seen GI.  Follow liver panel.       Relevant Orders   Hepatic function panel   Anemia    Follow cbc.       Chronic back pain    Being followed by neurosurgery/pain clinic.  Received trigger  point injections.  They prescribe his pain medications.  Follow.        Relevant Medications   naproxen (NAPROSYN) 500 MG tablet   Health care  maintenance    Physical today 01/04/18.  Colonoscopy 01/2011.  Check psa with next labs.        History of pancreatitis    Currently asymptomatic.        Hypercholesteremia    Low cholesterol diet and exercise.  Follow lipid panel.        Relevant Medications   amLODipine (NORVASC) 5 MG tablet   Other Relevant Orders   Lipid panel   TSH   Hypertension    Blood pressure elevated.  Increase amlodipine to 5mg  bid.  Follow pressures.  Get him back in soon to reassess.  Follow metabolic panel.       Relevant Medications   amLODipine (NORVASC) 5 MG tablet   Other Relevant Orders   Basic metabolic panel    Other Visit Diagnoses    Routine general medical examination at a health care facility    -  Primary   Skin lesion       Persistent skin lesion.  Request referral to dermatology.    Relevant Orders   Ambulatory referral to Dermatology   Prostate cancer screening       Relevant Orders   PSA, Medicare       Einar Pheasant, MD

## 2018-01-04 NOTE — Patient Instructions (Signed)
Increase amlodipine to 5mg twice a day.  

## 2018-01-07 ENCOUNTER — Encounter: Payer: Self-pay | Admitting: Internal Medicine

## 2018-01-07 NOTE — Assessment & Plan Note (Signed)
Currently asymptomatic 

## 2018-01-07 NOTE — Assessment & Plan Note (Signed)
Blood pressure elevated.  Increase amlodipine to 5mg  bid.  Follow pressures.  Get him back in soon to reassess.  Follow metabolic panel.

## 2018-01-07 NOTE — Assessment & Plan Note (Signed)
Follow cbc.  

## 2018-01-07 NOTE — Assessment & Plan Note (Signed)
Physical today 01/04/18.  Colonoscopy 01/2011.  Check psa with next labs.

## 2018-01-07 NOTE — Assessment & Plan Note (Signed)
Low cholesterol diet and exercise.  Follow lipid panel.   

## 2018-01-07 NOTE — Assessment & Plan Note (Signed)
Being followed by neurosurgery/pain clinic.  Received trigger point injections.  They prescribe his pain medications.  Follow.

## 2018-01-07 NOTE — Assessment & Plan Note (Signed)
Has seen GI.  Follow liver panel.

## 2018-01-18 ENCOUNTER — Ambulatory Visit: Payer: Self-pay | Admitting: *Deleted

## 2018-01-18 NOTE — Telephone Encounter (Signed)
Angela Nevin, who is present with the pt is calling stating that the pt is experiencing bleeding from the left groin where a mole was removed today. Angela Nevin states that the area where the mole was removed was about 1/2 of a dime in terms of size and was deep.  Angela Nevin states that the pt wanted to take a shower and when the band aid was removed from the groin area the pt began to bleed. Angela Nevin states that prior to removing the bandaid you could see that there was a lot of blood under the bandage. A new band aid was placed over the area and the pt continues to bleed but Angela Nevin states that blood is not gushing out. Angela Nevin advised to apply direct pressure to the area for 10 min to decrease the bleeding. Angela Nevin advised that if bleeding did not stop with direct pressure to seek care at Urgent Care/ED. Understanding verbalized.  Angela Nevin states that dermatologist who removed the mole was contacted prior to calling PCP but was no one answered the phone and no recording was noted.  Reason for Disposition . Minor cut or scratch  Answer Assessment - Initial Assessment Questions 1. APPEARANCE of INJURY: "What does the injury look like?"      Pt had mole removed from left groin area today 2. SIZE: "How large is the cut?"      Pt's wife states it is about have the size of a dime, the area is pretty deep 3. BLEEDING: "Is it bleeding now?" If so, ask: "Is it difficult to stop?"      Yes it is bleeding now but it is not gushing out 4. LOCATION: "Where is the injury located?"      Left groin area 5. ONSET: "How long ago did the injury occur?"      No injury, had mole removal today 6. MECHANISM: "Tell me how it happened."      Pt went to take a shower and band aid originally placed  was removed  Answer Assessment - Initial Assessment Questions 1. SYMPTOM: "What's the main symptom you're concerned about?" (e.g., redness, pain, drainage)     Bleeding where mole was removed 2. ONSET: "When did bleeding  start?"     Today after  removing band aid from incision site 3. SURGERY: "What surgery was performed?"     Mole removal to back and to left groin area 4. DATE of SURGERY: "When was surgery performed?"      Today, 01/18/18 5. INCISION SITE: "Where is the incision located?"      In the let groin area 6. REDNESS: "Is there any redness at the incision site?" If yes, ask: "How wide across is the redness?" (Inches, centimeters)      no 7. PAIN: "Is there any pain?" If so, ask: "How bad is it?"  (Scale 1-10; or mild, moderate, severe)     no 8. BLEEDING: "Is there any bleeding?" If so, ask: "How much?" and "Where?"     Yes, blood is not gushing out but you can see the bleeding from the band aid 9. DRAINAGE: "Is there any drainage from the incision site?" If yes, ask: "What color and how much?" (e.g., red, cloudy, pus; drops, teaspoon)     bloody  Protocols used: CUTS AND LACERATIONS-A-AH, POST-OP INCISION [facility]

## 2018-02-27 ENCOUNTER — Encounter: Payer: Self-pay | Admitting: Internal Medicine

## 2018-02-27 ENCOUNTER — Other Ambulatory Visit (INDEPENDENT_AMBULATORY_CARE_PROVIDER_SITE_OTHER): Payer: Medicare HMO

## 2018-02-27 DIAGNOSIS — E7800 Pure hypercholesterolemia, unspecified: Secondary | ICD-10-CM | POA: Diagnosis not present

## 2018-02-27 DIAGNOSIS — I10 Essential (primary) hypertension: Secondary | ICD-10-CM | POA: Diagnosis not present

## 2018-02-27 DIAGNOSIS — R945 Abnormal results of liver function studies: Secondary | ICD-10-CM

## 2018-02-27 DIAGNOSIS — Z125 Encounter for screening for malignant neoplasm of prostate: Secondary | ICD-10-CM | POA: Diagnosis not present

## 2018-02-27 DIAGNOSIS — R7989 Other specified abnormal findings of blood chemistry: Secondary | ICD-10-CM

## 2018-02-27 LAB — BASIC METABOLIC PANEL
BUN: 19 mg/dL (ref 6–23)
CO2: 26 meq/L (ref 19–32)
Calcium: 9.5 mg/dL (ref 8.4–10.5)
Chloride: 103 mEq/L (ref 96–112)
Creatinine, Ser: 1.07 mg/dL (ref 0.40–1.50)
GFR: 71.62 mL/min (ref >60.00)
GLUCOSE: 91 mg/dL (ref 70–99)
POTASSIUM: 4.3 meq/L (ref 3.5–5.1)
SODIUM: 138 meq/L (ref 135–145)

## 2018-02-27 LAB — LIPID PANEL
Cholesterol: 172 mg/dL (ref 0–200)
HDL: 32.5 mg/dL — ABNORMAL LOW (ref >39.00)
LDL Cholesterol: 102 mg/dL — ABNORMAL HIGH (ref 0–99)
NonHDL: 139.55
TRIGLYCERIDES: 190 mg/dL — AB (ref 0.0–149.0)
Total CHOL/HDL Ratio: 5
VLDL: 38 mg/dL (ref 0.0–40.0)

## 2018-02-27 LAB — TSH: TSH: 2.47 u[IU]/mL (ref 0.35–4.50)

## 2018-02-27 LAB — HEPATIC FUNCTION PANEL
ALK PHOS: 120 U/L — AB (ref 39–117)
ALT: 34 U/L (ref 0–53)
AST: 20 U/L (ref 0–37)
Albumin: 4.3 g/dL (ref 3.5–5.2)
BILIRUBIN DIRECT: 0.1 mg/dL (ref 0.0–0.3)
TOTAL PROTEIN: 7 g/dL (ref 6.0–8.3)
Total Bilirubin: 0.5 mg/dL (ref 0.2–1.2)

## 2018-02-27 LAB — PSA, MEDICARE: PSA: 0.78 ng/ml (ref 0.10–4.00)

## 2018-03-01 ENCOUNTER — Ambulatory Visit (INDEPENDENT_AMBULATORY_CARE_PROVIDER_SITE_OTHER): Payer: Medicare HMO | Admitting: Internal Medicine

## 2018-03-01 ENCOUNTER — Encounter: Payer: Self-pay | Admitting: Internal Medicine

## 2018-03-01 VITALS — BP 130/82 | HR 81 | Temp 97.6°F | Resp 16 | Wt 240.8 lb

## 2018-03-01 DIAGNOSIS — E7800 Pure hypercholesterolemia, unspecified: Secondary | ICD-10-CM | POA: Diagnosis not present

## 2018-03-01 DIAGNOSIS — M545 Low back pain, unspecified: Secondary | ICD-10-CM

## 2018-03-01 DIAGNOSIS — R945 Abnormal results of liver function studies: Secondary | ICD-10-CM

## 2018-03-01 DIAGNOSIS — I10 Essential (primary) hypertension: Secondary | ICD-10-CM

## 2018-03-01 DIAGNOSIS — Z8719 Personal history of other diseases of the digestive system: Secondary | ICD-10-CM

## 2018-03-01 DIAGNOSIS — D649 Anemia, unspecified: Secondary | ICD-10-CM

## 2018-03-01 DIAGNOSIS — R7989 Other specified abnormal findings of blood chemistry: Secondary | ICD-10-CM

## 2018-03-01 DIAGNOSIS — R748 Abnormal levels of other serum enzymes: Secondary | ICD-10-CM | POA: Diagnosis not present

## 2018-03-01 DIAGNOSIS — G8929 Other chronic pain: Secondary | ICD-10-CM

## 2018-03-01 MED ORDER — FLUTICASONE PROPIONATE 50 MCG/ACT NA SUSP: 2.0000 | Freq: Every day | NASAL | 1 refills | Status: DC

## 2018-03-01 MED ORDER — ROSUVASTATIN CALCIUM 5 MG PO TABS: 5.0000 mg | ORAL_TABLET | Freq: Every day | ORAL | 1 refills | Status: DC

## 2018-03-01 NOTE — Progress Notes (Signed)
Patient ID: [name], male   DOB: 1960/10/26, 58 y.o.   MRN: [id number]   Subjective:    Patient ID: [name], male    DOB: 02-28-1960, 58 y.o.   MRN: [id number]  HPI  Patient here for a scheduled follow up. Just saw cardiology 02/20/18.  Evaluated for an episode of chest pain.  Previous stress test negative.  Sleep study negative.  Felt no further w/up warranted.  He reports he is doing relatively well.  Discussed his weight gain.  Discussed the need for diet adjustment, exercise and weight loss.  No sob.  No acid reflux. No abdominal pain.  Bowels moving.  No urine change.  Discussed labs. Discussed calculated cholesterol risk.  Discussed the need to start a cholesterol medication.  He agrees.  States his blood pressure is averaging 130-140/80s.  Discussed slight elevation of alkaline phosphatase.  Will follow.     Past Medical History:  Diagnosis Date  . Allergy   . Anoxic brain injury ([place])   . Chronic back pain    s/p 5 back surgeries  . Coronary artery disease    Cardiac catheterization in 2007 at [facility] showed 30% stenosis in proximal LAD. No other disease. Ejection fraction was 65%  . Depression   . H/O acute pancreatitis   . Hypercholesterolemia   . Mild cognitive impairment with memory loss   . Pancreatitis    Past Surgical History:  Procedure Laterality Date  . abdomial surgery    . BACK SURGERY     multiple back surgeries (5)  . CARDIAC CATHETERIZATION     [facility]   . CHOLECYSTECTOMY  2013  . COLONOSCOPY WITH PROPOFOL N/A 06/30/2015   Procedure: COLONOSCOPY WITH PROPOFOL;  Surgeon: Lollie Sails, MD;  Location: [facility] ENDOSCOPY;  Service: Endoscopy;  Laterality: N/A;  . ESOPHAGOGASTRODUODENOSCOPY (EGD) WITH PROPOFOL N/A 03/30/2016   Procedure: ESOPHAGOGASTRODUODENOSCOPY (EGD) WITH PROPOFOL;  Surgeon: Manya Silvas, MD;  Location: [facility] ENDOSCOPY;  Service: Endoscopy;  Laterality: N/A;  . FUNCTIONAL ENDOSCOPIC SINUS SURGERY    . JOINT REPLACEMENT    .  LAMINECTOMY    . LUMBAR SPINE SURGERY    . SHOULDER ARTHROSCOPY WITH ROTATOR CUFF REPAIR AND OPEN BICEPS TENODESIS Right   . SHOULDER ARTHROSCOPY WITH ROTATOR CUFF REPAIR AND OPEN BICEPS TENODESIS    . SPINE SURGERY    . TRIGGER POINT INJECTION    . VASECTOMY     With Reversal  . VEIN SURGERY Right 2013   arm   Family History  Problem Relation Age of Onset  . Heart disease Father   . Heart attack Father   . Heart disease Mother   . Heart attack Mother   . Heart attack Brother        MI's x 2   . Hypertension Other   . Colon cancer Other   . Hypertension Sister   . Heart attack Paternal Grandfather   . Hypertension Sister    Social History   Socioeconomic History  . Marital status: Divorced    Spouse name: Not on file  . Number of children: 3  . Years of education: Not on file  . Highest education level: Not on file  Occupational History  . Not on file  Social Needs  . Financial resource strain: Not hard at all  . Food insecurity:    Worry: Never true    Inability: Never true  . Transportation needs:    Medical: No    Non-medical: No  Tobacco Use  . Smoking status: Never Smoker  . Smokeless tobacco: Never Used  Substance and Sexual Activity  . Alcohol use: No    Alcohol/week: 0.0 standard drinks  . Drug use: No  . Sexual activity: Yes  Lifestyle  . Physical activity:    Days per week: Not on file    Minutes per session: Not on file  . Stress: Not on file  Relationships  . Social connections:    Talks on phone: Not on file    Gets together: Not on file    Attends religious service: Not on file    Active member of club or organization: Not on file    Attends meetings of clubs or organizations: Not on file    Relationship status: Not on file  Other Topics Concern  . Not on file  Social History Narrative  . Not on file    Outpatient Encounter Medications as of 03/01/2018  Medication Sig  . amLODipine (NORVASC) 5 MG tablet Take 1 tablet (5 mg total) by  mouth 2 (two) times daily.  Marland Kitchen aspirin 81 MG chewable tablet Chew 81 mg by mouth daily as needed for moderate pain.   . fluticasone (FLONASE) 50 MCG/ACT nasal spray Place 2 sprays into both nostrils daily.  Marland Kitchen gabapentin (NEURONTIN) 300 MG capsule Take 600 mg by mouth at bedtime.   Marland Kitchen ibuprofen (ADVIL,MOTRIN) 800 MG tablet Take 800 mg by mouth 2 (two) times daily.   . modafinil (PROVIGIL) 200 MG tablet Take 400 mg by mouth daily.   . naproxen (NAPROSYN) 500 MG tablet Take 500 mg by mouth 2 (two) times daily with a meal.  . oxyCODONE (OXY IR/ROXICODONE) 5 MG immediate release tablet Take 5-10 mg by mouth at bedtime as needed for moderate pain.   . pantoprazole (PROTONIX) 40 MG tablet Take 40 mg by mouth daily.   . promethazine (PHENERGAN) 25 MG tablet Take 1 tablet (25 mg total) by mouth every 4 (four) hours as needed for nausea or vomiting.  . rosuvastatin (CRESTOR) 5 MG tablet Take 1 tablet (5 mg total) by mouth daily.  . sildenafil (REVATIO) 20 MG tablet Take 80-100 mg by mouth daily as needed (for hbp/ed).   . sucralfate (CARAFATE) 1 g tablet Take 1 g by mouth daily.   Marland Kitchen tiZANidine (ZANAFLEX) 4 MG tablet Take 4 mg by mouth at bedtime.    No facility-administered encounter medications on file as of 03/01/2018.     Review of Systems  Constitutional: Negative for appetite change and unexpected weight change.  HENT: Negative for congestion and sinus pressure.        Minimal sore throat over the last 24 hours. May be related to drainage.  Respiratory: Negative for cough, chest tightness and shortness of breath.   Cardiovascular: Negative for chest pain, palpitations and leg swelling.  Gastrointestinal: Negative for abdominal pain, diarrhea and nausea.  Genitourinary: Negative for difficulty urinating and dysuria.  Musculoskeletal: Positive for back pain. Negative for joint swelling and myalgias.  Skin: Negative for color change and rash.  Neurological: Negative for dizziness, light-headedness  and headaches.  Psychiatric/Behavioral: Negative for agitation and dysphoric mood.       Objective:    Physical Exam Constitutional:      General: He is not in acute distress.    Appearance: Normal appearance. He is well-developed.  HENT:     Nose: Nose normal. No congestion.     Mouth/Throat:     Pharynx: No oropharyngeal exudate or  posterior oropharyngeal erythema.  Cardiovascular:     Rate and Rhythm: Normal rate and regular rhythm.  Pulmonary:     Effort: Pulmonary effort is normal. No respiratory distress.     Breath sounds: Normal breath sounds.  Abdominal:     General: Bowel sounds are normal.     Palpations: Abdomen is soft.     Tenderness: There is no abdominal tenderness.  Musculoskeletal:        General: No swelling or tenderness.  Skin:    Findings: No erythema or rash.  Neurological:     Mental Status: He is alert.  Psychiatric:        Mood and Affect: Mood normal.        Behavior: Behavior normal.     BP 130/82 (BP Location: Left Arm, Patient Position: Sitting, Cuff Size: Normal)   Pulse 81   Temp 97.6 F (36.4 C) (Oral)   Resp 16   Wt 240 lb 12.8 oz (109.2 kg)   SpO2 98%   BMI 31.77 kg/m  Wt Readings from Last 3 Encounters:  03/01/18 240 lb 12.8 oz (109.2 kg)  01/04/18 236 lb (107 kg)  10/10/17 227 lb (103 kg)     Lab Results  Component Value Date   WBC 6.4 10/10/2017   HGB 14.5 10/10/2017   HCT 41.0 10/10/2017   PLT 257 10/10/2017   GLUCOSE 91 02/27/2018   CHOL 172 02/27/2018   TRIG 190.0 (H) 02/27/2018   HDL 32.50 (L) 02/27/2018   LDLCALC 102 (H) 02/27/2018   ALT 34 02/27/2018   AST 20 02/27/2018   NA 138 02/27/2018   K 4.3 02/27/2018   CL 103 02/27/2018   CREATININE 1.07 02/27/2018   BUN 19 02/27/2018   CO2 26 02/27/2018   TSH 2.47 02/27/2018   PSA 0.78 02/27/2018   INR 0.9 10/27/2011   HGBA1C 5.3 09/14/2012    Dg Chest 2 View  Result Date: 10/10/2017 CLINICAL DATA:  Chest pain. Intermittent facial tingling, dizziness,  and weakness. EXAM: CHEST - 2 VIEW COMPARISON:  04/13/2017 FINDINGS: The cardiomediastinal silhouette is unchanged and within normal limits. There is mild elevation of the right hemidiaphragm. The lungs are clear. No pleural effusion or pneumothorax is identified. No acute osseous abnormality is seen. IMPRESSION: No active cardiopulmonary disease. Electronically Signed   By: Logan Bores M.D.   On: 10/10/2017 10:18   Ct Head Wo Contrast  Result Date: 10/10/2017 CLINICAL DATA:  Dizziness and weakness with intermittent left facial tingling. EXAM: CT HEAD WITHOUT CONTRAST TECHNIQUE: Contiguous axial images were obtained from the base of the skull through the vertex without intravenous contrast. COMPARISON:  CT head dated May 03, 2012. FINDINGS: Brain: No evidence of acute infarction, hemorrhage, hydrocephalus, extra-axial collection or mass lesion/mass effect. Vascular: No hyperdense vessel or unexpected calcification. Skull: Normal. Negative for fracture or focal lesion. Sinuses/Orbits: No acute finding. Other: None. IMPRESSION: 1. Normal noncontrast head CT. Electronically Signed   By: [name] Dubin M.D.   On: 10/10/2017 11:21       Assessment & Plan:   Problem List Items Addressed This Visit    Abnormal liver function tests    Has seen GI.  Recent liver panel with elevated alkaline phos.  Follow.        Anemia    Follow [facility].        Chronic back pain    Followed by neurosurgery/pain clinic.  Receives trigger point injections.  They prescribe pain medications.  Follow.  History of pancreatitis    Currently asymptomatic.        Hypercholesteremia    Low cholesterol diet and exercise.  Discussed calculated cholesterol risk.  Start crestor.  Follow liver function tests and lipid panel.       Relevant Medications   rosuvastatin (CRESTOR) 5 MG tablet   Hypertension    Blood pressure as outlined.  On amlodipine.  Have him spot check his pressure.  Follow metabolic panel.         Relevant Medications   rosuvastatin (CRESTOR) 5 MG tablet    Other Visit Diagnoses    Elevated alkaline phosphatase level    -  Primary   Relevant Orders   Hepatic function panel   Hepatic function panel       Einar Pheasant, MD

## 2018-03-04 ENCOUNTER — Encounter: Payer: Self-pay | Admitting: Internal Medicine

## 2018-03-04 NOTE — Assessment & Plan Note (Signed)
Has seen GI.  Recent liver panel with elevated alkaline phos.  Follow.

## 2018-03-04 NOTE — Assessment & Plan Note (Signed)
Low cholesterol diet and exercise.  Discussed calculated cholesterol risk.  Start crestor.  Follow liver function tests and lipid panel.

## 2018-03-04 NOTE — Assessment & Plan Note (Signed)
Blood pressure as outlined.  On amlodipine.  Have him spot check his pressure.  Follow metabolic panel.

## 2018-03-04 NOTE — Assessment & Plan Note (Signed)
Follow cbc.  

## 2018-03-04 NOTE — Assessment & Plan Note (Signed)
Followed by neurosurgery/pain clinic.  Receives trigger point injections.  They prescribe pain medications.  Follow.   

## 2018-03-04 NOTE — Assessment & Plan Note (Signed)
Currently asymptomatic 

## 2018-03-22 ENCOUNTER — Other Ambulatory Visit (INDEPENDENT_AMBULATORY_CARE_PROVIDER_SITE_OTHER): Payer: Medicare HMO

## 2018-03-22 DIAGNOSIS — R748 Abnormal levels of other serum enzymes: Secondary | ICD-10-CM

## 2018-03-22 LAB — HEPATIC FUNCTION PANEL
ALT: 48 U/L (ref 0–53)
AST: 32 U/L (ref 0–37)
Albumin: 4.5 g/dL (ref 3.5–5.2)
Alkaline Phosphatase: 127 U/L — ABNORMAL HIGH (ref 39–117)
BILIRUBIN TOTAL: 0.5 mg/dL (ref 0.2–1.2)
Bilirubin, Direct: 0.1 mg/dL (ref 0.0–0.3)
Total Protein: 7.9 g/dL (ref 6.0–8.3)

## 2018-03-23 ENCOUNTER — Other Ambulatory Visit: Payer: Self-pay

## 2018-03-23 ENCOUNTER — Other Ambulatory Visit: Payer: Self-pay | Admitting: Internal Medicine

## 2018-03-23 DIAGNOSIS — R748 Abnormal levels of other serum enzymes: Secondary | ICD-10-CM

## 2018-03-23 MED ORDER — ROSUVASTATIN CALCIUM 5 MG PO TABS: 5.0000 mg | ORAL_TABLET | Freq: Every day | ORAL | 1 refills | Status: DC

## 2018-03-23 NOTE — Progress Notes (Signed)
Order placed for alkaline phos fractionation (isoenzymes)

## 2018-03-23 NOTE — Addendum Note (Signed)
Addended by: Leeanne Rio on: 03/23/2018 11:49 AM   Modules accepted: Orders

## 2018-03-25 ENCOUNTER — Other Ambulatory Visit: Payer: Self-pay | Admitting: Internal Medicine

## 2018-03-25 DIAGNOSIS — R748 Abnormal levels of other serum enzymes: Secondary | ICD-10-CM

## 2018-03-25 NOTE — Progress Notes (Signed)
Order placed for liver panel and alk phos isoenzymes.

## 2018-03-26 ENCOUNTER — Telehealth: Payer: Self-pay | Admitting: Internal Medicine

## 2018-03-26 NOTE — Telephone Encounter (Signed)
Charted in result notes. 

## 2018-03-26 NOTE — Telephone Encounter (Signed)
Copied from [place] [id number]. Topic: Quick Communication - Lab Results (Clinic Use ONLY) >> Mar 26, 2018 10:51 AM Lars Masson, LPN wrote: Called patient to inform them of lab results. When patient returns call, triage nurse may disclose results.

## 2018-03-27 ENCOUNTER — Other Ambulatory Visit: Payer: Self-pay | Admitting: Internal Medicine

## 2018-03-27 DIAGNOSIS — R748 Abnormal levels of other serum enzymes: Secondary | ICD-10-CM

## 2018-03-27 NOTE — Progress Notes (Signed)
Order placed for abdominal ultrasound.   

## 2018-04-04 ENCOUNTER — Encounter: Payer: Self-pay | Admitting: Internal Medicine

## 2018-04-04 NOTE — Telephone Encounter (Signed)
[name]-see message from pt. Thanks! Melissa

## 2018-04-10 ENCOUNTER — Other Ambulatory Visit: Payer: Self-pay | Admitting: Internal Medicine

## 2018-04-12 ENCOUNTER — Ambulatory Visit
Admission: RE | Admit: 2018-04-12 | Discharge: 2018-04-12 | Disposition: A | Discharge status: Home / Self Care | Payer: Medicare HMO | Source: Ambulatory Visit | Attending: Internal Medicine | Admitting: Internal Medicine

## 2018-04-12 ENCOUNTER — Other Ambulatory Visit: Payer: Self-pay

## 2018-04-12 DIAGNOSIS — R748 Abnormal levels of other serum enzymes: Secondary | ICD-10-CM

## 2018-04-14 ENCOUNTER — Other Ambulatory Visit: Payer: Self-pay | Admitting: Internal Medicine

## 2018-04-14 DIAGNOSIS — K760 Fatty (change of) liver, not elsewhere classified: Secondary | ICD-10-CM

## 2018-04-14 DIAGNOSIS — R748 Abnormal levels of other serum enzymes: Secondary | ICD-10-CM

## 2018-04-14 NOTE — Progress Notes (Signed)
Order placed for GI referral.   

## 2018-04-19 ENCOUNTER — Ambulatory Visit: Payer: Medicare HMO

## 2018-04-20 ENCOUNTER — Other Ambulatory Visit: Payer: Self-pay | Admitting: Internal Medicine

## 2018-04-23 ENCOUNTER — Telehealth: Payer: Self-pay

## 2018-04-23 ENCOUNTER — Other Ambulatory Visit: Payer: Self-pay | Admitting: Internal Medicine

## 2018-04-23 DIAGNOSIS — R7989 Other specified abnormal findings of blood chemistry: Secondary | ICD-10-CM

## 2018-04-23 DIAGNOSIS — R945 Abnormal results of liver function studies: Principal | ICD-10-CM

## 2018-04-23 NOTE — Telephone Encounter (Signed)
Are you ok with adding these labs if they send orders?

## 2018-04-23 NOTE — Telephone Encounter (Signed)
Copied from [place] [phone]. Topic: General - Other >> Apr 23, 2018 10:24 AM Valla Leaver wrote: Reason for CRM: [facility] rep, Suanne Marker, wants to know if she faxes additional lab orders to your office, can they be added onto the patients lab appointment for tomorrow 04/24/2018?

## 2018-04-23 NOTE — Telephone Encounter (Signed)
Ok.  Have them send orders.

## 2018-04-23 NOTE — Telephone Encounter (Signed)
Copied from [place] [phone]. Topic: General - Other >> Apr 23, 2018 10:24 AM Marin Olp L wrote: Reason for CRM: [facility] rep, Suanne Marker, wants to know if she faxes additional lab orders to your office, can they be added onto the patients lab appointment for tomorrow 04/24/2018? >> Apr 23, 2018  3:46 PM Leward Quan A wrote: Suanne Marker at [facility] called to ask for this test to be added to labs that will be done. (GGT) any questions please call Ph# [phone] ext.[phone]

## 2018-04-23 NOTE — Telephone Encounter (Signed)
Pt is already having hepatic function drawn tomorrow. I will send results.

## 2018-04-23 NOTE — Telephone Encounter (Signed)
GGT added.

## 2018-04-23 NOTE — Progress Notes (Signed)
Order placed for f/u lab.   

## 2018-04-24 ENCOUNTER — Other Ambulatory Visit (INDEPENDENT_AMBULATORY_CARE_PROVIDER_SITE_OTHER): Payer: Medicare HMO

## 2018-04-24 ENCOUNTER — Other Ambulatory Visit: Payer: Self-pay

## 2018-04-24 DIAGNOSIS — R7989 Other specified abnormal findings of blood chemistry: Secondary | ICD-10-CM

## 2018-04-24 DIAGNOSIS — R945 Abnormal results of liver function studies: Secondary | ICD-10-CM

## 2018-04-24 DIAGNOSIS — R748 Abnormal levels of other serum enzymes: Secondary | ICD-10-CM | POA: Diagnosis not present

## 2018-04-24 NOTE — Addendum Note (Signed)
Addended by: Arby Barrette on: 04/24/2018 08:30 AM   Modules accepted: Orders

## 2018-04-26 LAB — ALKALINE PHOSPHATASE, ISOENZYMES
BONE FRACTION: 19 % (ref 12–68)
INTESTINAL FRAC.: 0 % (ref 0–18)
LIVER FRACTION: 81 % (ref 13–88)

## 2018-04-26 LAB — HEPATIC FUNCTION PANEL
ALT: 56 IU/L — ABNORMAL HIGH (ref 0–44)
AST: 32 IU/L (ref 0–40)
Albumin: 5 g/dL — ABNORMAL HIGH (ref 3.8–4.9)
Alkaline Phosphatase: 131 IU/L — ABNORMAL HIGH (ref 39–117)
Bilirubin Total: 0.4 mg/dL (ref 0.0–1.2)
Bilirubin, Direct: 0.16 mg/dL (ref 0.00–0.40)
TOTAL PROTEIN: 7.6 g/dL (ref 6.0–8.5)

## 2018-04-26 LAB — GAMMA GT: GGT: 129 IU/L — ABNORMAL HIGH (ref 0–65)

## 2018-05-25 ENCOUNTER — Encounter: Payer: Self-pay | Admitting: Internal Medicine

## 2018-05-25 DIAGNOSIS — L729 Follicular cyst of the skin and subcutaneous tissue, unspecified: Secondary | ICD-10-CM

## 2018-05-28 NOTE — Telephone Encounter (Signed)
I have placed the order for the surgery referral.  Please notify pt that someone should be contacting him with appt date and time.  Let us know if any worsening problems or change.

## 2018-05-28 NOTE — Telephone Encounter (Signed)
Pt would like to have referral to surgery. Advised that this may take some time due to COVID 19. He stated that was fine.

## 2018-05-28 NOTE — Telephone Encounter (Signed)
See me about this before calling pt.  It would be helpful to know how it is now.  May need to have surgery evaluate, but may be able to do a virtual visit and see if abx and warm compresses may help.  See me before calling and can determine best course of action.

## 2018-05-29 ENCOUNTER — Telehealth: Payer: Self-pay | Admitting: *Deleted

## 2018-05-29 NOTE — Telephone Encounter (Signed)
He states it started as a cyst, black head that has become larger. He states he has used warm compresses but no drainage. The area is hard swollen and red. Image in [place]. Appointment tomorrow at 1100, pt agrees.

## 2018-05-30 ENCOUNTER — Other Ambulatory Visit: Payer: Self-pay

## 2018-05-30 ENCOUNTER — Encounter: Payer: Self-pay | Admitting: General Surgery

## 2018-05-30 ENCOUNTER — Ambulatory Visit (INDEPENDENT_AMBULATORY_CARE_PROVIDER_SITE_OTHER): Payer: Medicare HMO | Admitting: General Surgery

## 2018-05-30 VITALS — BP 137/84 | HR 93 | Temp 98.4°F | Resp 16 | Ht 73.0 in | Wt 211.0 lb

## 2018-05-30 DIAGNOSIS — R222 Localized swelling, mass and lump, trunk: Secondary | ICD-10-CM

## 2018-05-30 DIAGNOSIS — L723 Sebaceous cyst: Secondary | ICD-10-CM | POA: Diagnosis not present

## 2018-05-30 NOTE — Patient Instructions (Signed)
You may remove the dressing on Friday.  You may shower as usual. We will call you with the pathology results.

## 2018-05-30 NOTE — Progress Notes (Signed)
Patient ID: [name], male   DOB: 13-Apr-1960, 58 y.o.   MRN: [id number]  Chief Complaint  Patient presents with  . New Patient (Initial Visit)    Cyst of back    HPI [name] is a 58 y.o. male.  Here today for evaluation of cyst on back. Present for 2 weeks. No drainage. Painful.  HPI  Past Medical History:  Diagnosis Date  . Allergy   . Anoxic brain injury ([place])   . Chronic back pain    s/p 5 back surgeries  . Coronary artery disease    Cardiac catheterization in 2007 at [facility] showed 30% stenosis in proximal LAD. No other disease. Ejection fraction was 65%  . Depression   . H/O acute pancreatitis   . Hypercholesterolemia   . Mild cognitive impairment with memory loss   . Pancreatitis     Past Surgical History:  Procedure Laterality Date  . abdomial surgery    . BACK SURGERY     multiple back surgeries (5)  . CARDIAC CATHETERIZATION     [facility]   . CHOLECYSTECTOMY  2013  . COLONOSCOPY WITH PROPOFOL N/A 06/30/2015   Procedure: COLONOSCOPY WITH PROPOFOL;  Surgeon: Lollie Sails, MD;  Location: [facility] ENDOSCOPY;  Service: Endoscopy;  Laterality: N/A;  . ESOPHAGOGASTRODUODENOSCOPY (EGD) WITH PROPOFOL N/A 03/30/2016   Procedure: ESOPHAGOGASTRODUODENOSCOPY (EGD) WITH PROPOFOL;  Surgeon: Manya Silvas, MD;  Location: [facility] ENDOSCOPY;  Service: Endoscopy;  Laterality: N/A;  . FUNCTIONAL ENDOSCOPIC SINUS SURGERY    . JOINT REPLACEMENT    . LAMINECTOMY    . LUMBAR SPINE SURGERY    . SHOULDER ARTHROSCOPY WITH ROTATOR CUFF REPAIR AND OPEN BICEPS TENODESIS Right   . SHOULDER ARTHROSCOPY WITH ROTATOR CUFF REPAIR AND OPEN BICEPS TENODESIS    . SPINE SURGERY    . TRIGGER POINT INJECTION    . VASECTOMY     With Reversal  . VEIN SURGERY Right 2013   arm    Family History  Problem Relation Age of Onset  . Heart disease Father   . Heart attack Father   . Heart disease Mother   . Heart attack Mother   . Heart attack Brother        MI's x 2   . Hypertension Other   .  Colon cancer Other   . Hypertension Sister   . Heart attack Paternal Grandfather   . Hypertension Sister     Social History Social History   Tobacco Use  . Smoking status: Never Smoker  . Smokeless tobacco: Never Used  Substance Use Topics  . Alcohol use: No    Alcohol/week: 0.0 standard drinks  . Drug use: No    Allergies  Allergen Reactions  . Corticosteroids Other (See Comments)    Pancreatitis  . Penicillins Anaphylaxis    Has patient had a PCN reaction causing immediate rash, facial/tongue/throat swelling, SOB or lightheadedness with hypotension: no Has patient had a PCN reaction causing severe rash involving mucus membranes or skin necrosis: yes Has patient had a PCN reaction that required hospitalization: yes Has patient had a PCN reaction occurring within the last 10 years: no If all of the above answers are "NO", then may proceed with Cephalosporin use.   . Sulfa Antibiotics Other (See Comments)    Unknown reaction    Current Outpatient Medications  Medication Sig Dispense Refill  . amLODipine (NORVASC) 5 MG tablet TAKE 1 TABLET BY MOUTH TWICE A DAY 180 tablet 1  . aspirin  81 MG chewable tablet Chew 81 mg by mouth daily as needed for moderate pain.     . fluticasone (FLONASE) 50 MCG/ACT nasal spray Place 2 sprays into both nostrils daily. 48 g 1  . gabapentin (NEURONTIN) 300 MG capsule Take 600 mg by mouth at bedtime.   5  . ibuprofen (ADVIL,MOTRIN) 800 MG tablet Take 800 mg by mouth 2 (two) times daily.   3  . modafinil (PROVIGIL) 200 MG tablet Take 400 mg by mouth daily.     . naproxen (NAPROSYN) 500 MG tablet Take 500 mg by mouth 2 (two) times daily with a meal.    . oxyCODONE (OXY IR/ROXICODONE) 5 MG immediate release tablet Take 5-10 mg by mouth at bedtime as needed for moderate pain.   0  . pantoprazole (PROTONIX) 40 MG tablet Take 40 mg by mouth daily.     . promethazine (PHENERGAN) 25 MG tablet Take 1 tablet (25 mg total) by mouth every 4 (four) hours as  needed for nausea or vomiting. 30 tablet 1  . rosuvastatin (CRESTOR) 5 MG tablet TAKE 1 TABLET BY MOUTH EVERY DAY 30 tablet 1  . sildenafil (REVATIO) 20 MG tablet Take 80-100 mg by mouth daily as needed (for hbp/ed).   3  . sucralfate (CARAFATE) 1 g tablet Take 1 g by mouth daily.   1  . tiZANidine (ZANAFLEX) 4 MG tablet Take 4 mg by mouth at bedtime.      No current facility-administered medications for this visit.     Review of Systems Review of Systems  Constitutional: Negative.   Respiratory: Negative.   Cardiovascular: Negative.     Blood pressure 137/84, pulse 93, temperature 98.4 F (36.9 C), temperature source Temporal, resp. rate 16, height 6\' 1"  (1.854 m), weight 211 lb (95.7 kg), SpO2 97 %.  Physical Exam Physical Exam Pulmonary:    Skin:    General: Skin is warm and dry.  Neurological:     Mental Status: He is alert.     Data Reviewed Cyst procedure excision was reviewed and acceptable to the patient.  20 cc of 0.5% Xylocaine with 0.25% Marcaine with 1 to 200,000 units of epinephrine was utilized and well-tolerated.  ChloraPrep was applied to the skin.  Through an elliptical incision the cyst with its entire lining was excised.  Hemostasis was with 3-0 Vicryl ties.  The adipose layer was approximated with interrupted 3-0 Vicryl figure-of-eight sutures.  The skin was left slightly open with a 1 millimeter gap.  Telfa and Tegaderm dressing applied.  Assessment Inflamed sebaceous cyst, excised.  Plan  Postoperative wound care reviewed.  Occlusive dressing to be removed in 48 hours.  He may shower anytime.  He will call if incomplete healing occurs. HPI, Physical Exam, Assessment and Plan have been scribed under the direction and in the presence of Robert Bellow, MD. [name], CMA  I have completed the exam and reviewed the above documentation for accuracy and completeness.  I agree with the above.  Haematologist has been used and any errors in  dictation or transcription are unintentional.  [name], M.D., F.A.C.S.  [name] Shanell Aden 05/31/2018, 11:47 AM

## 2018-06-04 NOTE — Telephone Encounter (Signed)
Saw Dr Bary Castilla.  Had removed.  Cyst.

## 2018-06-13 ENCOUNTER — Other Ambulatory Visit: Payer: Self-pay | Admitting: Internal Medicine

## 2018-07-09 ENCOUNTER — Other Ambulatory Visit: Payer: Self-pay

## 2018-07-09 ENCOUNTER — Ambulatory Visit (INDEPENDENT_AMBULATORY_CARE_PROVIDER_SITE_OTHER): Payer: Medicare HMO | Admitting: Internal Medicine

## 2018-07-09 ENCOUNTER — Encounter: Payer: Self-pay | Admitting: Internal Medicine

## 2018-07-09 DIAGNOSIS — G8929 Other chronic pain: Secondary | ICD-10-CM

## 2018-07-09 DIAGNOSIS — I10 Essential (primary) hypertension: Secondary | ICD-10-CM

## 2018-07-09 DIAGNOSIS — E7800 Pure hypercholesterolemia, unspecified: Secondary | ICD-10-CM | POA: Diagnosis not present

## 2018-07-09 DIAGNOSIS — R7989 Other specified abnormal findings of blood chemistry: Secondary | ICD-10-CM

## 2018-07-09 DIAGNOSIS — R945 Abnormal results of liver function studies: Secondary | ICD-10-CM | POA: Diagnosis not present

## 2018-07-09 DIAGNOSIS — M545 Low back pain: Secondary | ICD-10-CM

## 2018-07-09 DIAGNOSIS — D649 Anemia, unspecified: Secondary | ICD-10-CM | POA: Diagnosis not present

## 2018-07-09 NOTE — Progress Notes (Signed)
Patient ID: [name], male   DOB: [birth year], 58 y.o.   MRN: [id number]   Virtual Visit via video Note  This visit type was conducted due to national recommendations for restrictions regarding the COVID-19 pandemic (e.g. social distancing).  This format is felt to be most appropriate for this patient at this time.  All issues noted in this document were discussed and addressed.  No physical exam was performed (except for noted visual exam findings with Video Visits).   I connected with [name] by a video enabled telemedicine application or telephone and verified that I am speaking with the correct person using two identifiers. Location patient: home Location provider: work Persons participating in the virtual visit: patient, provider  I discussed the limitations, risks, security and privacy concerns of performing an evaluation and management service by video and the availability of in person appointments.  The patient expressed understanding and agreed to proceed.   Reason for visit: scheduled follow up.   HPI: He reports he is doing relatively well.  Main issues are his chronic back pain (followed by pain clinic) and his knee pain.  Postponing surgery.  Is walking now 3 miles per day.  Has modified his diet.  Has lost weight.  Down to 211 pounds (last visit with me - 240 pounds).  Feels better.  No chest pain.  No sob.  No acid reflux.  No abdominal pain.  Bowels stable.  Handling stress.  Seeing psychiatry.  Describes some panic attacks.  States has discussed with psychiatry and is managing.  Discussed counseling.  He does not feel he needs any further intervention.  Blood pressure doing better.  Overall feels good.  Trying to stay in due to COVID restrictions.  No fever.  No chest congestion or sob. No known COVID exposure.     ROS: See pertinent positives and negatives per HPI.  Past Medical History:  Diagnosis Date  . Allergy   . Anoxic brain injury ([place])   . Chronic back  pain    s/p 5 back surgeries  . Coronary artery disease    Cardiac catheterization in 2007 at [facility] showed 30% stenosis in proximal LAD. No other disease. Ejection fraction was 65%  . Depression   . H/O acute pancreatitis   . Hypercholesterolemia   . Mild cognitive impairment with memory loss   . Pancreatitis     Past Surgical History:  Procedure Laterality Date  . abdomial surgery    . BACK SURGERY     multiple back surgeries (5)  . CARDIAC CATHETERIZATION     [facility]   . CHOLECYSTECTOMY  2013  . COLONOSCOPY WITH PROPOFOL N/A 06/30/2015   Procedure: COLONOSCOPY WITH PROPOFOL;  Surgeon: Lollie Sails, MD;  Location: [facility] ENDOSCOPY;  Service: Endoscopy;  Laterality: N/A;  . ESOPHAGOGASTRODUODENOSCOPY (EGD) WITH PROPOFOL N/A 03/30/2016   Procedure: ESOPHAGOGASTRODUODENOSCOPY (EGD) WITH PROPOFOL;  Surgeon: Manya Silvas, MD;  Location: [facility] ENDOSCOPY;  Service: Endoscopy;  Laterality: N/A;  . FUNCTIONAL ENDOSCOPIC SINUS SURGERY    . JOINT REPLACEMENT    . LAMINECTOMY    . LUMBAR SPINE SURGERY    . SHOULDER ARTHROSCOPY WITH ROTATOR CUFF REPAIR AND OPEN BICEPS TENODESIS Right   . SHOULDER ARTHROSCOPY WITH ROTATOR CUFF REPAIR AND OPEN BICEPS TENODESIS    . SPINE SURGERY    . TRIGGER POINT INJECTION    . VASECTOMY     With Reversal  . VEIN SURGERY Right 2013   arm    Family  History  Problem Relation Age of Onset  . Heart disease Father   . Heart attack Father   . Heart disease Mother   . Heart attack Mother   . Heart attack Brother        MI's x 2   . Hypertension Other   . Colon cancer Other   . Hypertension Sister   . Heart attack Paternal Grandfather   . Hypertension Sister     SOCIAL HX: reviewed.    Current Outpatient Medications:  .  diclofenac sodium (VOLTAREN) 1 % GEL, Apply topically., Disp: , Rfl:  .  amLODipine (NORVASC) 5 MG tablet, TAKE 1 TABLET BY MOUTH TWICE A DAY, Disp: 180 tablet, Rfl: 1 .  aspirin 81 MG chewable tablet, Chew 81 mg by mouth daily as  needed for moderate pain. , Disp: , Rfl:  .  atomoxetine (STRATTERA) 60 MG capsule, Take by mouth daily., Disp: , Rfl:  .  fluticasone (FLONASE) 50 MCG/ACT nasal spray, Place 2 sprays into both nostrils daily., Disp: 48 g, Rfl: 1 .  gabapentin (NEURONTIN) 300 MG capsule, Take 600 mg by mouth at bedtime. , Disp: , Rfl: 5 .  ibuprofen (ADVIL,MOTRIN) 800 MG tablet, Take 800 mg by mouth 2 (two) times daily. , Disp: , Rfl: 3 .  modafinil (PROVIGIL) 200 MG tablet, Take 400 mg by mouth daily. , Disp: , Rfl:  .  naproxen (NAPROSYN) 500 MG tablet, Take 500 mg by mouth 2 (two) times daily with a meal., Disp: , Rfl:  .  oxyCODONE (OXY IR/ROXICODONE) 5 MG immediate release tablet, Take 5-10 mg by mouth at bedtime as needed for moderate pain. , Disp: , Rfl: 0 .  pantoprazole (PROTONIX) 40 MG tablet, Take 40 mg by mouth daily. , Disp: , Rfl:  .  promethazine (PHENERGAN) 25 MG tablet, Take 1 tablet (25 mg total) by mouth every 4 (four) hours as needed for nausea or vomiting., Disp: 30 tablet, Rfl: 1 .  rosuvastatin (CRESTOR) 5 MG tablet, TAKE 1 TABLET BY MOUTH EVERY DAY, Disp: 90 tablet, Rfl: 1 .  sildenafil (REVATIO) 20 MG tablet, Take 80-100 mg by mouth daily as needed (for hbp/ed). , Disp: , Rfl: 3 .  sucralfate (CARAFATE) 1 g tablet, Take 1 g by mouth daily. , Disp: , Rfl: 1 .  tiZANidine (ZANAFLEX) 4 MG tablet, Take 4 mg by mouth at bedtime. , Disp: , Rfl:   EXAM:  VITALS per patient if applicable: weight 341 pounds.  131/76, 73  GENERAL: alert, oriented, appears well and in no acute distress  HEENT: atraumatic, conjunttiva clear, no obvious abnormalities on inspection of external nose and ears  NECK: normal movements of the head and neck  LUNGS: on inspection no signs of respiratory distress, breathing rate appears normal, no obvious gross SOB, gasping or wheezing  CV: no obvious cyanosis  PSYCH/NEURO: pleasant and cooperative, no obvious depression or anxiety, speech and thought processing  grossly intact  ASSESSMENT AND PLAN:  Discussed the following assessment and plan:   Abnormal liver function tests Worked up by GI.  pANCA negative.  Recommended following liver function tests.    Anemia Has seen GI.  Follow cbc.   Chronic back pain Followed by neurosurgery/pain clinic.  Receives trigger point injections.  They prescribe pain medications.  Follow.    Hypercholesteremia On crestor.  Has adjusted his diet.  Lost weight.  Follow lipid panel and liver function tests.    Hypertension On amlodipine.  Blood pressure 131/76.  Follow pressures.  Follow metabolic panel.  Continue current medication regimen.      I discussed the assessment and treatment plan with the patient. The patient was provided an opportunity to ask questions and all were answered. The patient agreed with the plan and demonstrated an understanding of the instructions.   The patient was advised to call back or seek an in-person evaluation if the symptoms worsen or if the condition fails to improve as anticipated.    Einar Pheasant, MD

## 2018-07-15 ENCOUNTER — Encounter: Payer: Self-pay | Admitting: Internal Medicine

## 2018-07-15 NOTE — Assessment & Plan Note (Signed)
Worked up by GI.  pANCA negative.  Recommended following liver function tests.

## 2018-07-15 NOTE — Assessment & Plan Note (Signed)
Has seen GI.  Follow cbc.  

## 2018-07-15 NOTE — Assessment & Plan Note (Signed)
Followed by neurosurgery/pain clinic.  Receives trigger point injections.  They prescribe pain medications.  Follow.

## 2018-07-15 NOTE — Assessment & Plan Note (Signed)
On amlodipine.  Blood pressure 131/76.  Follow pressures.  Follow metabolic panel.  Continue current medication regimen.

## 2018-07-15 NOTE — Assessment & Plan Note (Signed)
On crestor.  Has adjusted his diet.  Lost weight.  Follow lipid panel and liver function tests.

## 2018-07-23 ENCOUNTER — Encounter: Payer: Self-pay | Admitting: Internal Medicine

## 2018-07-24 NOTE — Telephone Encounter (Signed)
Are you okay with calling medication in that you discussed with him recently during virtual visit?

## 2018-07-24 NOTE — Telephone Encounter (Signed)
If he wants to do a virtual visit and let me look at the area and discuss treatment - ok to schedule tomorrow at 9:00 - or does he just want surgery referral?

## 2018-08-15 ENCOUNTER — Other Ambulatory Visit: Payer: Self-pay

## 2018-08-15 ENCOUNTER — Other Ambulatory Visit (INDEPENDENT_AMBULATORY_CARE_PROVIDER_SITE_OTHER): Payer: Medicare HMO

## 2018-08-15 DIAGNOSIS — R945 Abnormal results of liver function studies: Secondary | ICD-10-CM | POA: Diagnosis not present

## 2018-08-15 DIAGNOSIS — D649 Anemia, unspecified: Secondary | ICD-10-CM | POA: Diagnosis not present

## 2018-08-15 DIAGNOSIS — I10 Essential (primary) hypertension: Secondary | ICD-10-CM | POA: Diagnosis not present

## 2018-08-15 DIAGNOSIS — E7800 Pure hypercholesterolemia, unspecified: Secondary | ICD-10-CM | POA: Diagnosis not present

## 2018-08-15 DIAGNOSIS — R7989 Other specified abnormal findings of blood chemistry: Secondary | ICD-10-CM

## 2018-08-15 LAB — CBC WITH DIFFERENTIAL/PLATELET
Basophils Absolute: 0 10*3/uL (ref 0.0–0.1)
Basophils Relative: 0.6 % (ref 0.0–3.0)
Eosinophils Absolute: 0.2 10*3/uL (ref 0.0–0.7)
Eosinophils Relative: 2.7 % (ref 0.0–5.0)
HCT: 44.9 % (ref 39.0–52.0)
Hemoglobin: 15.2 g/dL (ref 13.0–17.0)
Lymphocytes Relative: 28.6 % (ref 12.0–46.0)
Lymphs Abs: 2.3 10*3/uL (ref 0.7–4.0)
MCHC: 33.9 g/dL (ref 30.0–36.0)
MCV: 91.9 fl (ref 78.0–100.0)
Monocytes Absolute: 0.6 10*3/uL (ref 0.1–1.0)
Monocytes Relative: 7.8 % (ref 3.0–12.0)
Neutro Abs: 4.8 10*3/uL (ref 1.4–7.7)
Neutrophils Relative %: 60.3 % (ref 43.0–77.0)
Platelets: 316 10*3/uL (ref 150.0–400.0)
RBC: 4.88 Mil/uL (ref 4.22–5.81)
RDW: 12.9 % (ref 11.5–15.5)
WBC: 8 10*3/uL (ref 4.0–10.5)

## 2018-08-15 LAB — HEPATIC FUNCTION PANEL
ALT: 29 U/L (ref 0–53)
AST: 20 U/L (ref 0–37)
Albumin: 4.5 g/dL (ref 3.5–5.2)
Alkaline Phosphatase: 139 U/L — ABNORMAL HIGH (ref 39–117)
Bilirubin, Direct: 0.1 mg/dL (ref 0.0–0.3)
Total Bilirubin: 0.6 mg/dL (ref 0.2–1.2)
Total Protein: 7.4 g/dL (ref 6.0–8.3)

## 2018-08-15 LAB — LIPID PANEL
Cholesterol: 120 mg/dL (ref 0–200)
HDL: 38.2 mg/dL — ABNORMAL LOW (ref >39.00)
LDL Cholesterol: 57 mg/dL (ref 0–99)
NonHDL: 81.43
Total CHOL/HDL Ratio: 3
Triglycerides: 122 mg/dL (ref 0.0–149.0)
VLDL: 24.4 mg/dL (ref 0.0–40.0)

## 2018-08-15 LAB — BASIC METABOLIC PANEL
BUN: 16 mg/dL (ref 6–23)
CO2: 27 mEq/L (ref 19–32)
Calcium: 9.6 mg/dL (ref 8.4–10.5)
Chloride: 103 mEq/L (ref 96–112)
Creatinine, Ser: 1.08 mg/dL (ref 0.40–1.50)
GFR: 70.73 mL/min (ref >60.00)
Glucose, Bld: 95 mg/dL (ref 70–99)
Potassium: 4.3 mEq/L (ref 3.5–5.1)
Sodium: 138 mEq/L (ref 135–145)

## 2018-08-16 ENCOUNTER — Encounter: Payer: Self-pay | Admitting: Internal Medicine

## 2018-10-03 ENCOUNTER — Other Ambulatory Visit: Payer: Self-pay | Admitting: Internal Medicine

## 2018-11-07 ENCOUNTER — Other Ambulatory Visit: Payer: Self-pay | Admitting: Internal Medicine

## 2018-11-28 ENCOUNTER — Encounter: Payer: Self-pay | Admitting: Internal Medicine

## 2018-11-28 ENCOUNTER — Other Ambulatory Visit: Payer: Self-pay | Admitting: Internal Medicine

## 2018-11-28 NOTE — Telephone Encounter (Signed)
See other message

## 2018-11-30 NOTE — Telephone Encounter (Signed)
Reviewed.  He will need to monitor symptoms.  If he changes his mind or if any change in symptoms, needs to be evaluated.   If symptoms, quarantine as we discussed (per guidelines).    Dr Nicki Reaper

## 2018-11-30 NOTE — Telephone Encounter (Signed)
Called to speak with patient. Encouraged that he do a virtual visit with you and be tested for covid. Patient stated that he did not feel that was necessary because as of last night he is not having symptoms. He slept well through the night and feels much better. Patient did not run fever or lose his taste/smell. Advised that he does not necessarily have to have those symptoms and still be positive. Patient stated he would like to hold on visit at this time.

## 2019-01-16 ENCOUNTER — Encounter: Payer: Self-pay | Admitting: Internal Medicine

## 2019-01-16 ENCOUNTER — Other Ambulatory Visit: Payer: Self-pay

## 2019-01-16 ENCOUNTER — Ambulatory Visit (INDEPENDENT_AMBULATORY_CARE_PROVIDER_SITE_OTHER): Payer: Medicare HMO | Admitting: Internal Medicine

## 2019-01-16 DIAGNOSIS — R0789 Other chest pain: Secondary | ICD-10-CM | POA: Diagnosis not present

## 2019-01-16 DIAGNOSIS — E7800 Pure hypercholesterolemia, unspecified: Secondary | ICD-10-CM

## 2019-01-16 DIAGNOSIS — R945 Abnormal results of liver function studies: Secondary | ICD-10-CM | POA: Diagnosis not present

## 2019-01-16 DIAGNOSIS — I10 Essential (primary) hypertension: Secondary | ICD-10-CM

## 2019-01-16 DIAGNOSIS — M545 Low back pain, unspecified: Secondary | ICD-10-CM

## 2019-01-16 DIAGNOSIS — R3911 Hesitancy of micturition: Secondary | ICD-10-CM

## 2019-01-16 DIAGNOSIS — R7989 Other specified abnormal findings of blood chemistry: Secondary | ICD-10-CM

## 2019-01-16 DIAGNOSIS — D649 Anemia, unspecified: Secondary | ICD-10-CM

## 2019-01-16 DIAGNOSIS — G8929 Other chronic pain: Secondary | ICD-10-CM

## 2019-01-16 DIAGNOSIS — Z8719 Personal history of other diseases of the digestive system: Secondary | ICD-10-CM

## 2019-01-16 NOTE — Progress Notes (Signed)
Patient ID: [name], male   DOB: 1961-01-16, 58 y.o.   MRN: [id number]   Virtual Visit via video Note  This visit type was conducted due to national recommendations for restrictions regarding the COVID-19 pandemic (e.g. social distancing).  This format is felt to be most appropriate for this patient at this time.  All issues noted in this document were discussed and addressed.  No physical exam was performed (except for noted visual exam findings with Video Visits).   I connected with Ralf Kupper by a video enabled telemedicine application and verified that I am speaking with the correct person using two identifiers. Location patient: home Location provider: work Persons participating in the virtual visit: patient, provider  The limitations, risks, security and privacy concerns of performing an evaluation and management service by video and the availability of in person appointments.  The patient expressed understanding and agreed to proceed.   Reason for visit: scheduled follow up/physical  HPI: He reports he is doing relatively well.  Still followed by neurosurgery for chronic back pain.  Receives injections q 3 weeks.  Also planning acupuncture.  Previous weight loss.  States he is still watching his diet.  Not exercising as much.  Has cut back on his walking. Has noticed a couple episodes of chest pain.  Described as sharp pain and also a tightness in his chest.  Some sob occasionally.  When bends down, can feel a little light headed.  May last 10 minutes.  Takes aspirin and resolves.  Discussed given risk factors and pain, referral to cardiology.  He also reports noticing a change with urination - slow to urinate at times.  Feels the sildenafil helps.     ROS: See pertinent positives and negatives per HPI.  Past Medical History:  Diagnosis Date  . Allergy   . Anoxic brain injury ([place])   . Chronic back pain    s/p 5 back surgeries  . Coronary artery disease    Cardiac  catheterization in 2007 at [facility] showed 30% stenosis in proximal LAD. No other disease. Ejection fraction was 65%  . Depression   . H/O acute pancreatitis   . Hypercholesterolemia   . Mild cognitive impairment with memory loss   . Pancreatitis     Past Surgical History:  Procedure Laterality Date  . abdomial surgery    . BACK SURGERY     multiple back surgeries (5)  . CARDIAC CATHETERIZATION     [facility]   . CHOLECYSTECTOMY  2013  . COLONOSCOPY WITH PROPOFOL N/A 06/30/2015   Procedure: COLONOSCOPY WITH PROPOFOL;  Surgeon: Lollie Sails, MD;  Location: [facility] ENDOSCOPY;  Service: Endoscopy;  Laterality: N/A;  . ESOPHAGOGASTRODUODENOSCOPY (EGD) WITH PROPOFOL N/A 03/30/2016   Procedure: ESOPHAGOGASTRODUODENOSCOPY (EGD) WITH PROPOFOL;  Surgeon: Manya Silvas, MD;  Location: [facility] ENDOSCOPY;  Service: Endoscopy;  Laterality: N/A;  . FUNCTIONAL ENDOSCOPIC SINUS SURGERY    . JOINT REPLACEMENT    . LAMINECTOMY    . LUMBAR SPINE SURGERY    . SHOULDER ARTHROSCOPY WITH ROTATOR CUFF REPAIR AND OPEN BICEPS TENODESIS Right   . SHOULDER ARTHROSCOPY WITH ROTATOR CUFF REPAIR AND OPEN BICEPS TENODESIS    . SPINE SURGERY    . TRIGGER POINT INJECTION    . VASECTOMY     With Reversal  . VEIN SURGERY Right 2013   arm    Family History  Problem Relation Age of Onset  . Heart disease Father   . Heart attack Father   .  Heart disease Mother   . Heart attack Mother   . Heart attack Brother        MI's x 2   . Hypertension Other   . Colon cancer Other   . Hypertension Sister   . Heart attack Paternal Grandfather   . Hypertension Sister     SOCIAL HX: reviewed.    Current Outpatient Medications:  .  pantoprazole (PROTONIX) 40 MG tablet, Take 40 mg by mouth 2 (two) times daily., Disp: , Rfl:  .  amLODipine (NORVASC) 5 MG tablet, TAKE 1 TABLET BY MOUTH TWICE A DAY, Disp: 180 tablet, Rfl: 1 .  aspirin 81 MG chewable tablet, Chew 81 mg by mouth daily as needed for moderate pain. , Disp: , Rfl:  .   atomoxetine (STRATTERA) 60 MG capsule, Take by mouth daily., Disp: , Rfl:  .  diclofenac sodium (VOLTAREN) 1 % GEL, Apply topically., Disp: , Rfl:  .  fluticasone (FLONASE) 50 MCG/ACT nasal spray, SPRAY 2 SPRAYS INTO EACH NOSTRIL EVERY DAY, Disp: 48 mL, Rfl: 1 .  gabapentin (NEURONTIN) 300 MG capsule, Take 600 mg by mouth at bedtime. , Disp: , Rfl: 5 .  ibuprofen (ADVIL,MOTRIN) 800 MG tablet, Take 800 mg by mouth 2 (two) times daily. , Disp: , Rfl: 3 .  modafinil (PROVIGIL) 200 MG tablet, Take 400 mg by mouth daily. , Disp: , Rfl:  .  naproxen (NAPROSYN) 500 MG tablet, Take 500 mg by mouth 2 (two) times daily with a meal., Disp: , Rfl:  .  oxyCODONE (OXY IR/ROXICODONE) 5 MG immediate release tablet, Take 5-10 mg by mouth at bedtime as needed for moderate pain. , Disp: , Rfl: 0 .  promethazine (PHENERGAN) 25 MG tablet, Take 1 tablet (25 mg total) by mouth every 4 (four) hours as needed for nausea or vomiting., Disp: 30 tablet, Rfl: 1 .  rosuvastatin (CRESTOR) 5 MG tablet, TAKE 1 TABLET BY MOUTH EVERY DAY, Disp: 90 tablet, Rfl: 1 .  sildenafil (REVATIO) 20 MG tablet, Take 80-100 mg by mouth daily as needed (for hbp/ed). , Disp: , Rfl: 3 .  sucralfate (CARAFATE) 1 g tablet, Take 1 g by mouth daily. , Disp: , Rfl: 1 .  tiZANidine (ZANAFLEX) 4 MG tablet, Take 4 mg by mouth at bedtime. , Disp: , Rfl:   EXAM:  GENERAL: alert, oriented, appears well and in no acute distress  HEENT: atraumatic, conjunttiva clear, no obvious abnormalities on inspection of external nose and ears  NECK: normal movements of the head and neck  LUNGS: on inspection no signs of respiratory distress, breathing rate appears normal, no obvious gross SOB, gasping or wheezing  CV: no obvious cyanosis  PSYCH/NEURO: pleasant and cooperative, no obvious depression or anxiety, speech and thought processing grossly intact  ASSESSMENT AND PLAN:  Discussed the following assessment and plan:  Abnormal liver function tests Worked  up by GI.  Recommended following liver function tests.    Anemia Has seen GI.  Follow [facility].   Chest pain Had a couple of episodes of chest pain as outlined.  Relieved with aspirin.  Given risk factors, do feel f/u with cardiology is warranted.  Referral to cardiology.    Chronic back pain Followed by neurosurgery/pain clinic.  Receives trigger point injections q 3 weeks.  Starting acupuncture.  Follow.    History of pancreatitis Currently asymptomatic.  Follow lipase and amylase - pt request.   Hypercholesteremia On crestor.  Low cholesterol diet and exercise.  Follow lipid panel and  liver function tests.    Hypertension Have him spot check his pressures. Send in readings.  Adjust if elevation.  Follow metabolic panel.   Urinary hesitancy Slow to start urine stream at times.  Discussed further evaluation.  Urology - [facility] - pt prefers.     I discussed the assessment and treatment plan with the patient. The patient was provided an opportunity to ask questions and all were answered. The patient agreed with the plan and demonstrated an understanding of the instructions.   The patient was advised to call back or seek an in-person evaluation if the symptoms worsen or if the condition fails to improve as anticipated.   Einar Pheasant, MD

## 2019-01-20 ENCOUNTER — Encounter: Payer: Self-pay | Admitting: Internal Medicine

## 2019-01-20 DIAGNOSIS — R3911 Hesitancy of micturition: Secondary | ICD-10-CM | POA: Insufficient documentation

## 2019-01-20 NOTE — Assessment & Plan Note (Signed)
On crestor.  Low cholesterol diet and exercise.  Follow lipid panel and liver function tests.   

## 2019-01-20 NOTE — Assessment & Plan Note (Signed)
Has seen GI.  Follow cbc.  

## 2019-01-20 NOTE — Assessment & Plan Note (Signed)
Currently asymptomatic.  Follow lipase and amylase - pt request.

## 2019-01-20 NOTE — Assessment & Plan Note (Signed)
Have him spot check his pressures. Send in readings.  Adjust if elevation.  Follow metabolic panel.

## 2019-01-20 NOTE — Assessment & Plan Note (Signed)
Followed by neurosurgery/pain clinic.  Receives trigger point injections q 3 weeks.  Starting acupuncture.  Follow.

## 2019-01-20 NOTE — Assessment & Plan Note (Signed)
Slow to start urine stream at times.  Discussed further evaluation.  Urology - [facility] - pt prefers.

## 2019-01-20 NOTE — Assessment & Plan Note (Signed)
Had a couple of episodes of chest pain as outlined.  Relieved with aspirin.  Given risk factors, do feel f/u with cardiology is warranted.  Referral to cardiology.

## 2019-01-20 NOTE — Assessment & Plan Note (Signed)
Worked up by GI.  Recommended following liver function tests.

## 2019-02-06 ENCOUNTER — Other Ambulatory Visit: Payer: Self-pay

## 2019-02-06 ENCOUNTER — Other Ambulatory Visit: Payer: Self-pay | Admitting: Internal Medicine

## 2019-02-06 ENCOUNTER — Other Ambulatory Visit (INDEPENDENT_AMBULATORY_CARE_PROVIDER_SITE_OTHER): Payer: Medicare HMO

## 2019-02-06 DIAGNOSIS — E7800 Pure hypercholesterolemia, unspecified: Secondary | ICD-10-CM | POA: Diagnosis not present

## 2019-02-06 DIAGNOSIS — Z8719 Personal history of other diseases of the digestive system: Secondary | ICD-10-CM

## 2019-02-06 DIAGNOSIS — R945 Abnormal results of liver function studies: Secondary | ICD-10-CM

## 2019-02-06 DIAGNOSIS — I10 Essential (primary) hypertension: Secondary | ICD-10-CM

## 2019-02-06 DIAGNOSIS — E875 Hyperkalemia: Secondary | ICD-10-CM

## 2019-02-06 DIAGNOSIS — R7989 Other specified abnormal findings of blood chemistry: Secondary | ICD-10-CM

## 2019-02-06 LAB — BASIC METABOLIC PANEL
BUN: 24 mg/dL — ABNORMAL HIGH (ref 6–23)
CO2: 29 mEq/L (ref 19–32)
Calcium: 9.8 mg/dL (ref 8.4–10.5)
Chloride: 102 mEq/L (ref 96–112)
Creatinine, Ser: 1.09 mg/dL (ref 0.40–1.50)
GFR: 69.86 mL/min (ref >60.00)
Glucose, Bld: 95 mg/dL (ref 70–99)
Potassium: 5.1 mEq/L (ref 3.5–5.1)
Sodium: 138 mEq/L (ref 135–145)

## 2019-02-06 LAB — AMYLASE: Amylase: 40 U/L (ref 27–131)

## 2019-02-06 LAB — HEPATIC FUNCTION PANEL
ALT: 68 U/L — ABNORMAL HIGH (ref 0–53)
AST: 45 U/L — ABNORMAL HIGH (ref 0–37)
Albumin: 4.6 g/dL (ref 3.5–5.2)
Alkaline Phosphatase: 180 U/L — ABNORMAL HIGH (ref 39–117)
Bilirubin, Direct: 0.1 mg/dL (ref 0.0–0.3)
Total Bilirubin: 0.5 mg/dL (ref 0.2–1.2)
Total Protein: 7.7 g/dL (ref 6.0–8.3)

## 2019-02-06 LAB — LIPASE: Lipase: 42 U/L (ref 11.0–59.0)

## 2019-02-06 LAB — LIPID PANEL
Cholesterol: 145 mg/dL (ref 0–200)
HDL: 47.4 mg/dL (ref >39.00)
LDL Cholesterol: 73 mg/dL (ref 0–99)
NonHDL: 97.72
Total CHOL/HDL Ratio: 3
Triglycerides: 123 mg/dL (ref 0.0–149.0)
VLDL: 24.6 mg/dL (ref 0.0–40.0)

## 2019-02-06 LAB — TSH: TSH: 2.17 u[IU]/mL (ref 0.35–4.50)

## 2019-02-06 NOTE — Progress Notes (Signed)
Order placed for f/u labs.  

## 2019-02-07 ENCOUNTER — Encounter: Payer: Self-pay | Admitting: *Deleted

## 2019-02-07 DIAGNOSIS — R945 Abnormal results of liver function studies: Secondary | ICD-10-CM

## 2019-02-07 DIAGNOSIS — R7989 Other specified abnormal findings of blood chemistry: Secondary | ICD-10-CM

## 2019-02-08 NOTE — Telephone Encounter (Signed)
Order placed for GI referral.  Needs lab appt scheduled.  Lab orders in.  See note.

## 2019-03-29 ENCOUNTER — Other Ambulatory Visit: Payer: Self-pay | Admitting: Internal Medicine

## 2019-05-26 ENCOUNTER — Other Ambulatory Visit: Payer: Self-pay | Admitting: Internal Medicine

## 2019-05-29 ENCOUNTER — Other Ambulatory Visit: Payer: Self-pay

## 2019-05-29 ENCOUNTER — Encounter: Payer: Self-pay | Admitting: Internal Medicine

## 2019-05-29 ENCOUNTER — Ambulatory Visit (INDEPENDENT_AMBULATORY_CARE_PROVIDER_SITE_OTHER): Payer: Medicare HMO | Admitting: Internal Medicine

## 2019-05-29 VITALS — BP 128/80 | HR 100 | Temp 97.9°F | Resp 16 | Ht 73.0 in | Wt 231.0 lb

## 2019-05-29 DIAGNOSIS — Z8719 Personal history of other diseases of the digestive system: Secondary | ICD-10-CM | POA: Diagnosis not present

## 2019-05-29 DIAGNOSIS — R55 Syncope and collapse: Secondary | ICD-10-CM | POA: Diagnosis not present

## 2019-05-29 DIAGNOSIS — G8929 Other chronic pain: Secondary | ICD-10-CM

## 2019-05-29 DIAGNOSIS — R5383 Other fatigue: Secondary | ICD-10-CM

## 2019-05-29 DIAGNOSIS — R7989 Other specified abnormal findings of blood chemistry: Secondary | ICD-10-CM

## 2019-05-29 DIAGNOSIS — E7800 Pure hypercholesterolemia, unspecified: Secondary | ICD-10-CM | POA: Diagnosis not present

## 2019-05-29 DIAGNOSIS — I10 Essential (primary) hypertension: Secondary | ICD-10-CM

## 2019-05-29 DIAGNOSIS — M545 Low back pain: Secondary | ICD-10-CM

## 2019-05-29 DIAGNOSIS — D649 Anemia, unspecified: Secondary | ICD-10-CM

## 2019-05-29 DIAGNOSIS — R945 Abnormal results of liver function studies: Secondary | ICD-10-CM

## 2019-05-29 DIAGNOSIS — R3911 Hesitancy of micturition: Secondary | ICD-10-CM

## 2019-05-29 LAB — BASIC METABOLIC PANEL
BUN: 16 mg/dL (ref 6–23)
CO2: 28 mEq/L (ref 19–32)
Calcium: 9.5 mg/dL (ref 8.4–10.5)
Chloride: 103 mEq/L (ref 96–112)
Creatinine, Ser: 1.07 mg/dL (ref 0.40–1.50)
GFR: 71.29 mL/min (ref >60.00)
Glucose, Bld: 101 mg/dL — ABNORMAL HIGH (ref 70–99)
Potassium: 4.9 mEq/L (ref 3.5–5.1)
Sodium: 138 mEq/L (ref 135–145)

## 2019-05-29 LAB — LIPID PANEL
Cholesterol: 112 mg/dL (ref 0–200)
HDL: 38.8 mg/dL — ABNORMAL LOW (ref >39.00)
LDL Cholesterol: 52 mg/dL (ref 0–99)
NonHDL: 73.4
Total CHOL/HDL Ratio: 3
Triglycerides: 107 mg/dL (ref 0.0–149.0)
VLDL: 21.4 mg/dL (ref 0.0–40.0)

## 2019-05-29 LAB — LIPASE: Lipase: 38 U/L (ref 11.0–59.0)

## 2019-05-29 LAB — HEPATIC FUNCTION PANEL
ALT: 33 U/L (ref 0–53)
AST: 22 U/L (ref 0–37)
Albumin: 4.4 g/dL (ref 3.5–5.2)
Alkaline Phosphatase: 153 U/L — ABNORMAL HIGH (ref 39–117)
Bilirubin, Direct: 0.1 mg/dL (ref 0.0–0.3)
Total Bilirubin: 0.4 mg/dL (ref 0.2–1.2)
Total Protein: 7.3 g/dL (ref 6.0–8.3)

## 2019-05-29 LAB — AMYLASE: Amylase: 39 U/L (ref 27–131)

## 2019-05-29 NOTE — Progress Notes (Signed)
Patient ID: [name], male   DOB: May 25, 1960, 59 y.o.   MRN: [id number]   Subjective:    Patient ID: [name], male    DOB: Oct 23, 1960, 59 y.o.   MRN: [id number]  HPI This visit occurred during the SARS-CoV-2 public health emergency.  Safety protocols were in place, including screening questions prior to the visit, additional usage of staff PPE, and extensive cleaning of exam room while observing appropriate contact time as indicated for disinfecting solutions.  Patient here for a scheduled follow up.  He reports he has noticed being more depressed.  Noticed over the last several weeks.  Does not want to go out.  Is sleeping.  Fatigue.  Not exercising.  No suicidal ideations.  States blood pressures averaging 140-150s/80s. No chest pain or sob reported.  No abdominal pain or bowel change reported.  Last month while in [place], was sitting visiting.  Stood up and walked to kitchen.  Does not remember - after standing up.  Family reported was pale and white.  Increased sweating.  Passed out.  Did not hit the floor.  Was caught - before hitting the floor.  No further episodes.  No dizziness.  Does feel light headed if bends.  Saw GI.  Was placed on elavil.  Did not tolerate.  Taking carafate and protonix - controls symptoms.  He feels the syncopal episode was related to being on the elavil and had also started tamsulosin.    Past Medical History:  Diagnosis Date  . Allergy   . Anoxic brain injury ([place])   . Chronic back pain    s/p 5 back surgeries  . Coronary artery disease    Cardiac catheterization in 2007 at [facility] showed 30% stenosis in proximal LAD. No other disease. Ejection fraction was 65%  . Depression   . H/O acute pancreatitis   . Hypercholesterolemia   . Mild cognitive impairment with memory loss   . Pancreatitis    Past Surgical History:  Procedure Laterality Date  . abdomial surgery    . BACK SURGERY     multiple back surgeries (5)  . CARDIAC CATHETERIZATION      [facility]   . CHOLECYSTECTOMY  2013  . COLONOSCOPY WITH PROPOFOL N/A 06/30/2015   Procedure: COLONOSCOPY WITH PROPOFOL;  Surgeon: Lollie Sails, MD;  Location: [facility] ENDOSCOPY;  Service: Endoscopy;  Laterality: N/A;  . ESOPHAGOGASTRODUODENOSCOPY (EGD) WITH PROPOFOL N/A 03/30/2016   Procedure: ESOPHAGOGASTRODUODENOSCOPY (EGD) WITH PROPOFOL;  Surgeon: Manya Silvas, MD;  Location: [facility] ENDOSCOPY;  Service: Endoscopy;  Laterality: N/A;  . FUNCTIONAL ENDOSCOPIC SINUS SURGERY    . JOINT REPLACEMENT    . LAMINECTOMY    . LUMBAR SPINE SURGERY    . SHOULDER ARTHROSCOPY WITH ROTATOR CUFF REPAIR AND OPEN BICEPS TENODESIS Right   . SHOULDER ARTHROSCOPY WITH ROTATOR CUFF REPAIR AND OPEN BICEPS TENODESIS    . SPINE SURGERY    . TRIGGER POINT INJECTION    . VASECTOMY     With Reversal  . VEIN SURGERY Right 2013   arm   Family History  Problem Relation Age of Onset  . Heart disease Father   . Heart attack Father   . Heart disease Mother   . Heart attack Mother   . Heart attack Brother        MI's x 2   . Hypertension Other   . Colon cancer Other   . Hypertension Sister   . Heart attack Paternal Grandfather   . Hypertension  Sister    Social History   Socioeconomic History  . Marital status: Divorced    Spouse name: Not on file  . Number of children: 3  . Years of education: Not on file  . Highest education level: Not on file  Occupational History  . Not on file  Tobacco Use  . Smoking status: Never Smoker  . Smokeless tobacco: Never Used  Substance and Sexual Activity  . Alcohol use: No    Alcohol/week: 0.0 standard drinks  . Drug use: No  . Sexual activity: Yes  Other Topics Concern  . Not on file  Social History Narrative  . Not on file   Social Determinants of Health   Financial Resource Strain:   . Difficulty of Paying Living Expenses:   Food Insecurity:   . Worried About Charity fundraiser in the Last Year:   . Arboriculturist in the Last Year:   Transportation  Needs:   . Film/video editor (Medical):   Marland Kitchen Lack of Transportation (Non-Medical):   Physical Activity:   . Days of Exercise per Week:   . Minutes of Exercise per Session:   Stress:   . Feeling of Stress :   Social Connections:   . Frequency of Communication with Friends and Family:   . Frequency of Social Gatherings with Friends and Family:   . Attends Religious Services:   . Active Member of Clubs or Organizations:   . Attends Archivist Meetings:   Marland Kitchen Marital Status:     Outpatient Encounter Medications as of 05/29/2019  Medication Sig  . amLODipine (NORVASC) 5 MG tablet TAKE 1 TABLET BY MOUTH TWICE A DAY  . aspirin 81 MG chewable tablet Chew 81 mg by mouth daily as needed for moderate pain.   Marland Kitchen atomoxetine (STRATTERA) 60 MG capsule Take by mouth daily.  . diclofenac sodium (VOLTAREN) 1 % GEL Apply topically.  . fluticasone (FLONASE) 50 MCG/ACT nasal spray SPRAY 2 SPRAYS INTO EACH NOSTRIL EVERY DAY  . gabapentin (NEURONTIN) 300 MG capsule Take 600 mg by mouth at bedtime.   Marland Kitchen ibuprofen (ADVIL,MOTRIN) 800 MG tablet Take 800 mg by mouth 2 (two) times daily.   . modafinil (PROVIGIL) 200 MG tablet Take 400 mg by mouth daily.   . naproxen (NAPROSYN) 500 MG tablet Take 500 mg by mouth 2 (two) times daily with a meal.  . oxyCODONE (OXY IR/ROXICODONE) 5 MG immediate release tablet Take 5-10 mg by mouth at bedtime as needed for moderate pain.   . pantoprazole (PROTONIX) 40 MG tablet Take 40 mg by mouth 2 (two) times daily.  . promethazine (PHENERGAN) 25 MG tablet Take 1 tablet (25 mg total) by mouth every 4 (four) hours as needed for nausea or vomiting.  . rosuvastatin (CRESTOR) 5 MG tablet TAKE 1 TABLET BY MOUTH EVERY DAY  . sildenafil (REVATIO) 20 MG tablet Take 80-100 mg by mouth daily as needed (for hbp/ed).   . sucralfate (CARAFATE) 1 g tablet Take 1 g by mouth daily.   . tamsulosin (FLOMAX) 0.4 MG CAPS capsule Take 0.4 mg by mouth daily.  Marland Kitchen tiZANidine (ZANAFLEX) 4 MG  tablet Take 4 mg by mouth at bedtime.    No facility-administered encounter medications on file as of 05/29/2019.    Review of Systems  Constitutional: Negative for appetite change and unexpected weight change.  HENT: Negative for congestion and sinus pressure.   Respiratory: Negative for cough, chest tightness and shortness of breath.  Cardiovascular: Negative for chest pain, palpitations and leg swelling.  Gastrointestinal: Negative for abdominal pain, diarrhea, nausea and vomiting.  Genitourinary: Negative for difficulty urinating and dysuria.  Musculoskeletal: Negative for joint swelling and myalgias.  Skin: Negative for color change and rash.  Neurological: Positive for syncope and light-headedness. Negative for dizziness and headaches.  Psychiatric/Behavioral: Negative for agitation and dysphoric mood.       Some depression as outlined.         Objective:    Physical Exam Vitals reviewed.  Constitutional:      General: He is not in acute distress.    Appearance: Normal appearance. He is well-developed.  HENT:     Head: Normocephalic and atraumatic.     Right Ear: External ear normal.     Left Ear: External ear normal.  Eyes:     General: No scleral icterus.       Right eye: No discharge.        Left eye: No discharge.     Conjunctiva/sclera: Conjunctivae normal.  Cardiovascular:     Rate and Rhythm: Normal rate and regular rhythm.  Pulmonary:     Effort: Pulmonary effort is normal. No respiratory distress.     Breath sounds: Normal breath sounds.  Abdominal:     General: Bowel sounds are normal.     Palpations: Abdomen is soft.     Tenderness: There is no abdominal tenderness.  Musculoskeletal:        General: No swelling or tenderness.     Cervical back: Neck supple. No tenderness.  Lymphadenopathy:     Cervical: No cervical adenopathy.  Skin:    Findings: No erythema or rash.  Neurological:     Mental Status: He is alert.  Psychiatric:        Mood and  Affect: Mood normal.        Behavior: Behavior normal.     BP 128/80   Pulse 100   Temp 97.9 F (36.6 C)   Resp 16   Ht 6\' 1"  (1.854 m)   Wt 231 lb (104.8 kg)   SpO2 98%   BMI 30.48 kg/m  Wt Readings from Last 3 Encounters:  05/29/19 231 lb (104.8 kg)  01/16/19 222 lb (100.7 kg)  07/09/18 211 lb (95.7 kg)     Lab Results  Component Value Date   WBC 6.7 05/29/2019   HGB 14.7 05/29/2019   HCT 42.9 05/29/2019   PLT 314.0 05/29/2019   GLUCOSE 101 (H) 05/29/2019   CHOL 112 05/29/2019   TRIG 107.0 05/29/2019   HDL 38.80 (L) 05/29/2019   LDLCALC 52 05/29/2019   ALT 33 05/29/2019   AST 22 05/29/2019   NA 138 05/29/2019   K 4.9 05/29/2019   CL 103 05/29/2019   CREATININE 1.07 05/29/2019   BUN 16 05/29/2019   CO2 28 05/29/2019   TSH 2.17 02/06/2019   PSA 0.78 02/27/2018   INR 0.9 10/27/2011   HGBA1C 5.3 09/14/2012    US Abdomen Complete  Result Date: 04/12/2018 CLINICAL DATA:  59 year old male with elevated LFTs. History of cholecystectomy. EXAM: ABDOMEN ULTRASOUND COMPLETE COMPARISON:  03/27/2016 CT and 05/08/2015 ultrasound FINDINGS: Gallbladder: Not visualized compatible with cholecystectomy. Common bile duct: Diameter: 6 mm. No intrahepatic or extrahepatic biliary dilatation. Liver: Slightly increased hepatic echogenicity may represent mild hepatic steatosis. No suspicious focal hepatic lesions noted. Portal vein is patent on color Doppler imaging with normal direction of blood flow towards the liver. IVC: No abnormality visualized. Pancreas: Visualized  portion unremarkable. Spleen: Size and appearance within normal limits. Right Kidney: Length: 12 cm. A mid renal cyst is present. Echogenicity within normal limits. No solid mass or hydronephrosis visualized. Left Kidney: Length: 12.1 cm. Echogenicity within normal limits. No mass or hydronephrosis visualized. Abdominal aorta: No aneurysm visualized. Other findings: None. IMPRESSION: 1. Probable mild hepatic steatosis.  No  biliary dilatation. 2. No other significant abnormalities. Electronically Signed   By: Margarette Canada M.D.   On: 04/12/2018 14:50       Assessment & Plan:   Problem List Items Addressed This Visit    Abnormal liver function tests    Worked up by GI.  Recheck liver function tests.        Anemia    Follow cbc.        Chronic back pain    Followed by pain clinic.  Stable. Receives trigger point injections q 3 weeks.        Fatigue    Increased fatigue.  EKG as outlined.  Plan f/u with cardiology as outlined.  Discussed stress/depression.  Plans to start getting out some.  Discussed diet and exercise.  Discussed counseling.  Wants to monitor.  Check routine labs to confirm wnl.       Relevant Orders   CBC with Differential/Platelet (Completed)   History of pancreatitis    Currently asymptomatic.  Request to have amylase and lipase checked.        Relevant Orders   Amylase (Completed)   Lipase (Completed)   Hypercholesteremia    On crestor.  Low cholesterol diet and exercise.  Follow lipid panel and liver function tests.        Relevant Orders   Lipid panel (Completed)   Hepatic function panel (Completed)   Hypertension - Primary    Blood pressure as outlined.  Check here - ok.  Have him spot check his pressure.  Send in readings.  conitnue amlodipine.  Follow pressures.  Follow metabolic panel.       Relevant Orders   Basic metabolic panel (Completed)   Syncope    Had the syncopal episode as outlined.  He relates to being on the elavil.  No recurring episodes.  No chest pain reported.  EKG - SR with no acute ischemic changes.  Has seen cardiology.  Refer back for further evaluation and question of need for further cardiac w/up.        Relevant Orders   EKG 12-Lead   Urinary hesitancy    Seeing urology.  On tamsulosin.            Einar Pheasant, MD

## 2019-05-30 LAB — CBC WITH DIFFERENTIAL/PLATELET
Basophils Absolute: 0.1 10*3/uL (ref 0.0–0.1)
Basophils Relative: 0.9 % (ref 0.0–3.0)
Eosinophils Absolute: 0.2 10*3/uL (ref 0.0–0.7)
Eosinophils Relative: 2.3 % (ref 0.0–5.0)
HCT: 42.9 % (ref 39.0–52.0)
Hemoglobin: 14.7 g/dL (ref 13.0–17.0)
Lymphocytes Relative: 29.8 % (ref 12.0–46.0)
Lymphs Abs: 2 10*3/uL (ref 0.7–4.0)
MCHC: 34.3 g/dL (ref 30.0–36.0)
MCV: 91.5 fl (ref 78.0–100.0)
Monocytes Absolute: 0.5 10*3/uL (ref 0.1–1.0)
Monocytes Relative: 7.4 % (ref 3.0–12.0)
Neutro Abs: 4 10*3/uL (ref 1.4–7.7)
Neutrophils Relative %: 59.6 % (ref 43.0–77.0)
Platelets: 314 10*3/uL (ref 150.0–400.0)
RBC: 4.7 Mil/uL (ref 4.22–5.81)
RDW: 13.2 % (ref 11.5–15.5)
WBC: 6.7 10*3/uL (ref 4.0–10.5)

## 2019-05-31 ENCOUNTER — Encounter: Payer: Self-pay | Admitting: Internal Medicine

## 2019-06-03 ENCOUNTER — Telehealth: Payer: Self-pay | Admitting: Internal Medicine

## 2019-06-03 ENCOUNTER — Encounter: Payer: Self-pay | Admitting: Internal Medicine

## 2019-06-03 NOTE — Assessment & Plan Note (Addendum)
Followed by pain clinic.  Stable. Receives trigger point injections q 3 weeks.

## 2019-06-03 NOTE — Assessment & Plan Note (Signed)
Worked up by GI.  Recheck liver function tests.

## 2019-06-03 NOTE — Telephone Encounter (Signed)
He sees Dr Miquel Dunn regularly.  Pt had recent syncopal episode.  Needs earlier appt with Dr Saralyn Pilar - cardiology - [facility].

## 2019-06-03 NOTE — Assessment & Plan Note (Signed)
Follow cbc.  

## 2019-06-03 NOTE — Telephone Encounter (Signed)
Good morning!  I called [facility] Dr Saralyn Pilar ofc and pt is scheduled on 05/06 @11am . Pt has been notified of appt. Pt understood.

## 2019-06-03 NOTE — Assessment & Plan Note (Signed)
Increased fatigue.  EKG as outlined.  Plan f/u with cardiology as outlined.  Discussed stress/depression.  Plans to start getting out some.  Discussed diet and exercise.  Discussed counseling.  Wants to monitor.  Check routine labs to confirm wnl.

## 2019-06-03 NOTE — Assessment & Plan Note (Signed)
Currently asymptomatic.  Request to have amylase and lipase checked.

## 2019-06-03 NOTE — Assessment & Plan Note (Signed)
On crestor.  Low cholesterol diet and exercise.  Follow lipid panel and liver function tests.   

## 2019-06-03 NOTE — Telephone Encounter (Signed)
Thank you.    Dr Feleshia Zundel 

## 2019-06-03 NOTE — Assessment & Plan Note (Signed)
Had the syncopal episode as outlined.  He relates to being on the elavil.  No recurring episodes.  No chest pain reported.  EKG - SR with no acute ischemic changes.  Has seen cardiology.  Refer back for further evaluation and question of need for further cardiac w/up.

## 2019-06-03 NOTE — Assessment & Plan Note (Signed)
Blood pressure as outlined.  Check here - ok.  Have him spot check his pressure.  Send in readings.  conitnue amlodipine.  Follow pressures.  Follow metabolic panel.

## 2019-06-03 NOTE — Assessment & Plan Note (Signed)
Seeing urology.  On tamsulosin.   

## 2019-06-05 NOTE — Telephone Encounter (Signed)
YW! °

## 2019-07-23 ENCOUNTER — Ambulatory Visit (INDEPENDENT_AMBULATORY_CARE_PROVIDER_SITE_OTHER): Payer: Medicare HMO

## 2019-07-23 VITALS — BP 134/77 | HR 70 | Ht 73.0 in | Wt 231.0 lb

## 2019-07-23 DIAGNOSIS — Z0000 Encounter for general adult medical examination without abnormal findings: Secondary | ICD-10-CM

## 2019-07-23 NOTE — Patient Instructions (Addendum)
[name] , Thank you for taking time to come for your Medicare Wellness Visit. I appreciate your ongoing commitment to your health goals. Please review the following plan we discussed and let me know if I can assist you in the future.   These are the goals we discussed: Goals      Patient Stated   .  Weight goal 215lb (pt-stated)      Other   .  Increase physical activity      Swim more for exercise       This is a list of the screening recommended for you and due dates:  Health Maintenance  Topic Date Due  . HIV Screening  Never done  . Flu Shot  09/01/2019  . Tetanus Vaccine  06/11/2024  . Colon Cancer Screening  06/29/2025  . COVID-19 Vaccine  Completed  .  Hepatitis C: One time screening is recommended by Center for Disease Control  (CDC) for  adults born from [birth year] through 1963.   Completed    Immunizations Immunization History  Administered Date(s) Administered  . Influenza Inj Mdck Quad Pf 10/07/2017  . Influenza,inj,Quad PF,6+ Mos 10/15/2014, 10/06/2015, 10/11/2018  . Influenza-Unspecified 11/09/2012, 11/14/2013, 09/28/2016  . Moderna SARS-COVID-2 Vaccination 04/22/2019, 05/20/2019  . Tdap 06/12/2014    Shingrix Vaccine Completed: Yes. Notes completed at local pharmacy a few years ago. Agrees to bring updated immunization record to next scheduled appointment 08/06/19.   Advanced directives: declined  Conditions/risks identified: none new  Follow up in one year for your annual wellness visit   Preventive Care 40-64 Years, Male Preventive care refers to lifestyle choices and visits with your health care provider that can promote health and wellness. What does preventive care include?  A yearly physical exam. This is also called an annual well check.  Dental exams once or twice a year.  Routine eye exams. Ask your health care provider how often you should have your eyes checked.  Personal lifestyle choices, including:  Daily care of your teeth and  gums.  Regular physical activity.  Eating a healthy diet.  Avoiding tobacco and drug use.  Limiting alcohol use.  Practicing safe sex.  Taking low-dose aspirin every day starting at age 59. What happens during an annual well check? The services and screenings done by your health care provider during your annual well check will depend on your age, overall health, lifestyle risk factors, and family history of disease. Counseling  Your health care provider may ask you questions about your:  Alcohol use.  Tobacco use.  Drug use.  Emotional well-being.  Home and relationship well-being.  Sexual activity.  Eating habits.  Work and work Statistician. Screening  You may have the following tests or measurements:  Height, weight, and BMI.  Blood pressure.  Lipid and cholesterol levels. These may be checked every 5 years, or more frequently if you are over 10 years old.  Skin check.  Lung cancer screening. You may have this screening every year starting at age 6 if you have a 30-pack-year history of smoking and currently smoke or have quit within the past 15 years.  Fecal occult blood test (FOBT) of the stool. You may have this test every year starting at age 35.  Flexible sigmoidoscopy or colonoscopy. You may have a sigmoidoscopy every 5 years or a colonoscopy every 10 years starting at age 54.  Prostate cancer screening. Recommendations will vary depending on your family history and other risks.  Hepatitis C blood test.  Hepatitis B blood test.  Sexually transmitted disease (STD) testing.  Diabetes screening. This is done by checking your blood sugar (glucose) after you have not eaten for a while (fasting). You may have this done every 1-3 years. Discuss your test results, treatment options, and if necessary, the need for more tests with your health care provider. Vaccines  Your health care provider may recommend certain vaccines, such as:  Influenza vaccine.  This is recommended every year.  Tetanus, diphtheria, and acellular pertussis (Tdap, Td) vaccine. You may need a Td booster every 10 years.  Zoster vaccine. You may need this after age 61.  Pneumococcal 13-valent conjugate (PCV13) vaccine. You may need this if you have certain conditions and have not been vaccinated.  Pneumococcal polysaccharide (PPSV23) vaccine. You may need one or two doses if you smoke cigarettes or if you have certain conditions. Talk to your health care provider about which screenings and vaccines you need and how often you need them. This information is not intended to replace advice given to you by your health care provider. Make sure you discuss any questions you have with your health care provider. Document Released: 02/13/2015 Document Revised: 10/07/2015 Document Reviewed: 11/18/2014 Elsevier Interactive Patient Education  2017 [place] Prevention in the Home Falls can cause injuries. They can happen to people of all ages. There are many things you can do to make your home safe and to help prevent falls. What can I do on the outside of my home?  Regularly fix the edges of walkways and driveways and fix any cracks.  Remove anything that might make you trip as you walk through a door, such as a raised step or threshold.  Trim any bushes or trees on the path to your home.  Use bright outdoor lighting.  Clear any walking paths of anything that might make someone trip, such as rocks or tools.  Regularly check to see if handrails are loose or broken. Make sure that both sides of any steps have handrails.  Any raised decks and porches should have guardrails on the edges.  Have any leaves, snow, or ice cleared regularly.  Use sand or salt on walking paths during winter.  Clean up any spills in your garage right away. This includes oil or grease spills. What can I do in the bathroom?  Use night lights.  Install grab bars by the toilet and in the  tub and shower. Do not use towel bars as grab bars.  Use non-skid mats or decals in the tub or shower.  If you need to sit down in the shower, use a plastic, non-slip stool.  Keep the floor dry. Clean up any water that spills on the floor as soon as it happens.  Remove soap buildup in the tub or shower regularly.  Attach bath mats securely with double-sided non-slip rug tape.  Do not have throw rugs and other things on the floor that can make you trip. What can I do in the bedroom?  Use night lights.  Make sure that you have a light by your bed that is easy to reach.  Do not use any sheets or blankets that are too big for your bed. They should not hang down onto the floor.  Have a firm chair that has side arms. You can use this for support while you get dressed.  Do not have throw rugs and other things on the floor that can make you trip. What can I do  in the kitchen?  Clean up any spills right away.  Avoid walking on wet floors.  Keep items that you use a lot in easy-to-reach places.  If you need to reach something above you, use a strong step stool that has a grab bar.  Keep electrical cords out of the way.  Do not use floor polish or wax that makes floors slippery. If you must use wax, use non-skid floor wax.  Do not have throw rugs and other things on the floor that can make you trip. What can I do with my stairs?  Do not leave any items on the stairs.  Make sure that there are handrails on both sides of the stairs and use them. Fix handrails that are broken or loose. Make sure that handrails are as long as the stairways.  Check any carpeting to make sure that it is firmly attached to the stairs. Fix any carpet that is loose or worn.  Avoid having throw rugs at the top or bottom of the stairs. If you do have throw rugs, attach them to the floor with carpet tape.  Make sure that you have a light switch at the top of the stairs and the bottom of the stairs. If you  do not have them, ask someone to add them for you. What else can I do to help prevent falls?  Wear shoes that:  Do not have high heels.  Have rubber bottoms.  Are comfortable and fit you well.  Are closed at the toe. Do not wear sandals.  If you use a stepladder:  Make sure that it is fully opened. Do not climb a closed stepladder.  Make sure that both sides of the stepladder are locked into place.  Ask someone to hold it for you, if possible.  Clearly mark and make sure that you can see:  Any grab bars or handrails.  First and last steps.  Where the edge of each step is.  Use tools that help you move around (mobility aids) if they are needed. These include:  Canes.  Walkers.  Scooters.  Crutches.  Turn on the lights when you go into a dark area. Replace any light bulbs as soon as they burn out.  Set up your furniture so you have a clear path. Avoid moving your furniture around.  If any of your floors are uneven, fix them.  If there are any pets around you, be aware of where they are.  Review your medicines with your doctor. Some medicines can make you feel dizzy. This can increase your chance of falling. Ask your doctor what other things that you can do to help prevent falls. This information is not intended to replace advice given to you by your health care provider. Make sure you discuss any questions you have with your health care provider. Document Released: 11/13/2008 Document Revised: 06/25/2015 Document Reviewed: 02/21/2014 Elsevier Interactive Patient Education  2017 [employer].

## 2019-07-23 NOTE — Progress Notes (Addendum)
Subjective:   [name] is a 59 y.o. male who presents for Medicare Annual/Subsequent preventive examination.  Review of Systems    No ROS.  Medicare Wellness Virtual Visit.   Cardiac Risk Factors include: advanced age (>22men, >52 women);male gender     Objective:    Today's Vitals   07/23/19 0831  BP: 134/77  Pulse: 70  Weight: 231 lb (104.8 kg)  Height: 6\' 1"  (1.854 m)   Body mass index is 30.48 kg/m.  Advanced Directives 07/23/2019 10/10/2017 04/17/2017 11/07/2016 04/14/2016 03/30/2016 03/27/2016  Does Patient Have a Medical Advance Directive? No No;Yes No No No No No  Type of Advance Directive - Healthcare Power of [place];Living will - - - - -  Does patient want to make changes to medical advance directive? - - - - - - -  Copy of [place] in Chart? - No - copy requested - - - - -  Would patient like information on creating a medical advance directive? No - Patient declined No - Patient declined Yes (MAU/Ambulatory/Procedural Areas - Information given) - No - Patient declined No - Patient declined No - Patient declined    Current Medications (verified) Outpatient Encounter Medications as of 07/23/2019  Medication Sig  . amLODipine (NORVASC) 5 MG tablet TAKE 1 TABLET BY MOUTH TWICE A DAY  . aspirin 81 MG chewable tablet Chew 81 mg by mouth daily as needed for moderate pain.   Marland Kitchen atomoxetine (STRATTERA) 60 MG capsule Take by mouth daily.  . fluticasone (FLONASE) 50 MCG/ACT nasal spray SPRAY 2 SPRAYS INTO EACH NOSTRIL EVERY DAY  . gabapentin (NEURONTIN) 300 MG capsule Take 600 mg by mouth at bedtime.   Marland Kitchen ibuprofen (ADVIL,MOTRIN) 800 MG tablet Take 800 mg by mouth 2 (two) times daily.   . modafinil (PROVIGIL) 200 MG tablet Take 400 mg by mouth daily.   . naproxen (NAPROSYN) 500 MG tablet Take 500 mg by mouth 2 (two) times daily with a meal.  . oxyCODONE (OXY IR/ROXICODONE) 5 MG immediate release tablet Take 5-10 mg by mouth at bedtime as needed for  moderate pain.   . pantoprazole (PROTONIX) 40 MG tablet Take 40 mg by mouth 2 (two) times daily.  . promethazine (PHENERGAN) 25 MG tablet Take 1 tablet (25 mg total) by mouth every 4 (four) hours as needed for nausea or vomiting.  . rosuvastatin (CRESTOR) 5 MG tablet TAKE 1 TABLET BY MOUTH EVERY DAY  . sildenafil (REVATIO) 20 MG tablet Take 80-100 mg by mouth daily as needed (for hbp/ed).   . sucralfate (CARAFATE) 1 g tablet Take 1 g by mouth daily.   . tamsulosin (FLOMAX) 0.4 MG CAPS capsule Take 0.4 mg by mouth daily.  Marland Kitchen tiZANidine (ZANAFLEX) 4 MG tablet Take 4 mg by mouth at bedtime.    No facility-administered encounter medications on file as of 07/23/2019.    Allergies (verified) Corticosteroids, Penicillins, and Sulfa antibiotics   History: Past Medical History:  Diagnosis Date  . Allergy   . Anoxic brain injury ([place])   . Chronic back pain    s/p 5 back surgeries  . Coronary artery disease    Cardiac catheterization in 2007 at [facility] showed 30% stenosis in proximal LAD. No other disease. Ejection fraction was 65%  . Depression   . H/O acute pancreatitis   . Hypercholesterolemia   . Mild cognitive impairment with memory loss   . Pancreatitis    Past Surgical History:  Procedure Laterality Date  .  abdomial surgery    . BACK SURGERY     multiple back surgeries (5)  . CARDIAC CATHETERIZATION     [facility]   . CHOLECYSTECTOMY  2013  . COLONOSCOPY WITH PROPOFOL N/A 06/30/2015   Procedure: COLONOSCOPY WITH PROPOFOL;  Surgeon: Lollie Sails, MD;  Location[facility] ENDOSCOPY;  Service: Endoscopy;  Laterality: N/A;  . ESOPHAGOGASTRODUODENOSCOPY (EGD) WITH PROPOFOL N/A 03/30/2016   Procedure: ESOPHAGOGASTRODUODENOSCOPY (EGD) WITH PROPOFOL;  Surgeon: Manya Silvas, MD;  Location: [facility] ENDOSCOPY;  Service: Endoscopy;  Laterality: N/A;  . FUNCTIONAL ENDOSCOPIC SINUS SURGERY    . JOINT REPLACEMENT    . LAMINECTOMY    . LUMBAR SPINE SURGERY    . SHOULDER ARTHROSCOPY WITH ROTATOR CUFF  REPAIR AND OPEN BICEPS TENODESIS Right   . SHOULDER ARTHROSCOPY WITH ROTATOR CUFF REPAIR AND OPEN BICEPS TENODESIS    . SPINE SURGERY    . TRIGGER POINT INJECTION    . VASECTOMY     With Reversal  . VEIN SURGERY Right 2013   arm   Family History  Problem Relation Age of Onset  . Heart disease Father   . Heart attack Father   . Heart disease Mother   . Heart attack Mother   . Heart attack Brother        MI's x 2   . Hypertension Other   . Colon cancer Other   . Hypertension Sister   . Heart attack Paternal Grandfather   . Hypertension Sister    Social History   Socioeconomic History  . Marital status: Divorced    Spouse name: Not on file  . Number of children: 3  . Years of education: Not on file  . Highest education level: Not on file  Occupational History  . Not on file  Tobacco Use  . Smoking status: Never Smoker  . Smokeless tobacco: Never Used  Vaping Use  . Vaping Use: Never used  Substance and Sexual Activity  . Alcohol use: No    Alcohol/week: 0.0 standard drinks  . Drug use: No  . Sexual activity: Yes  Other Topics Concern  . Not on file  Social History Narrative  . Not on file   Social Determinants of Health   Financial Resource Strain:   . Difficulty of Paying Living Expenses:   Food Insecurity:   . Worried About Charity fundraiser in the Last Year:   . Arboriculturist in the Last Year:   Transportation Needs:   . Film/video editor (Medical):   Marland Kitchen Lack of Transportation (Non-Medical):   Physical Activity:   . Days of Exercise per Week:   . Minutes of Exercise per Session:   Stress:   . Feeling of Stress :   Social Connections:   . Frequency of Communication with Friends and Family:   . Frequency of Social Gatherings with Friends and Family:   . Attends Religious Services:   . Active Member of Clubs or Organizations:   . Attends Archivist Meetings:   Marland Kitchen Marital Status:     Tobacco Counseling Counseling given: Not  Answered   Clinical Intake:  Pre-visit preparation completed: Yes        Diabetes: No  How often do you need to have someone help you when you read instructions, pamphlets, or other written materials from your doctor or pharmacy?: 1 - Never   Interpreter Needed?: No      Activities of Daily Living In your present state of health,  do you have any difficulty performing the following activities: 07/23/2019  Hearing? N  Vision? N  Difficulty concentrating or making decisions? Y  Comment Hx of brain injury  Walking or climbing stairs? Y  Dressing or bathing? N  Doing errands, shopping? N  Preparing Food and eating ? N  Using the Toilet? N  In the past six months, have you accidently leaked urine? N  Do you have problems with loss of bowel control? N  Managing your Medications? N  Managing your Finances? N  Housekeeping or managing your Housekeeping? N  Some recent data might be hidden    Patient Care Team: Einar Pheasant, MD as PCP - General (Internal Medicine) Bary Castilla, [name], MD (General Surgery) Crecencio Mc, MD (Internal Medicine)  Indicate any recent Medical Services you may have received from other than [facility] providers in the past year (date may be approximate).     Assessment:   This is a routine wellness examination for Lauri.  I connected with Selig today by telephone and verified that I am speaking with the correct person using two identifiers. Location patient: home Location provider: work Persons participating in the virtual visit: patient, Marine scientist.    I discussed the limitations, risks, security and privacy concerns of performing an evaluation and management service by telephone and the availability of in person appointments. The patient expressed understanding and verbally consented to this telephonic visit.    Interactive audio and video telecommunications were attempted between this provider and patient, however failed, due to patient having  technical difficulties OR patient did not have access to video capability.  We continued and completed visit with audio only.  Some vital signs may be absent or patient reported.   Hearing/Vision screen  Hearing Screening   125Hz  250Hz  500Hz  1000Hz  2000Hz  3000Hz  4000Hz  6000Hz  8000Hz   Right ear:           Left ear:           Comments: Patient is able to hear conversational tones without difficulty.  No issues reported.  Vision Screening Comments: Followed by [facility] Wears corrective lenses Visual acuity not assessed, virtual visit.  They have seen their ophthalmologist.     Dietary issues and exercise activities discussed:Healthy diet Current Exercise Habits: Home exercise routine, Type of exercise: calisthenics (swimming daily more than 60 minutes), Time (Minutes): > 60, Frequency (Times/Week): 5, Weekly Exercise (Minutes/Week): 0, Intensity: Moderate  Goals      Patient Stated   .  Weight goal 215lb (pt-stated)      Other   .  Increase physical activity      Swim more for exercise      Depression Screen PHQ 2/9 Scores 07/23/2019 04/17/2017 04/14/2016 10/29/2015 02/24/2015  PHQ - 2 Score 1 0 0 0 0    Fall Risk Fall Risk  07/23/2019 05/30/2018 04/17/2017 04/14/2016 10/29/2015  Falls in the past year? 0 0 No Yes No  Number falls in past yr: 1 - - 2 or more -  Injury with Fall? 0 - - No -  Risk Factor Category  - - - High Fall Risk -  Risk for fall due to : Impaired balance/gait - - History of fall(s) -  Risk for fall due to: Comment - - - Chronic back pain, L knee pain -  Follow up - - - Falls prevention discussed;Education provided -    Handrails in use when climbing stairs? Yes  Home free of loose throw rugs in walkways, pet  beds, electrical cords, etc? Yes  Adequate lighting in your home to reduce risk of falls? Yes   ASSISTIVE DEVICES UTILIZED TO PREVENT FALLS:  Life alert? No  Use of a cane, walker or w/c? Yes  Grab bars in the bathroom? No  Shower chair or bench  in shower? No  Elevated toilet seat or a handicapped toilet? No   TIMED UP AND GO:  Was the test performed? No . Virtual visit.   Cognitive Function: MMSE - Mini Mental State Exam 04/17/2017 04/14/2016  Orientation to time 5 5  Orientation to Place 5 5  Registration 3 3  Attention/ Calculation 5 5  Recall 2 3  Language- name 2 objects 2 2  Language- repeat 1 1  Language- follow 3 step command 3 3  Language- read & follow direction 1 1  Write a sentence 1 1  Copy design 1 1  Total score 29 30     6CIT Screen 07/23/2019  What Year? 0 points  What month? 0 points  Months in reverse 2 points  Repeat phrase 2 points    Immunizations Immunization History  Administered Date(s) Administered  . Influenza Inj Mdck Quad Pf 10/07/2017  . Influenza,inj,Quad PF,6+ Mos 10/15/2014, 10/06/2015, 10/11/2018  . Influenza-Unspecified 11/09/2012, 11/14/2013, 09/28/2016  . Moderna SARS-COVID-2 Vaccination 04/22/2019, 05/20/2019  . Tdap 06/12/2014    Shingrix Vaccine Completed: Yes. Notes completed at local pharmacy a few years ago. Agrees to bring updated immunization record to next scheduled appointment 08/06/19.    Health Maintenance Health Maintenance  Topic Date Due  . HIV Screening  Never done  . INFLUENZA VACCINE  09/01/2019  . TETANUS/TDAP  06/11/2024  . COLONOSCOPY  06/29/2025  . COVID-19 Vaccine  Completed  . Hepatitis C Screening  Completed   Lung Cancer Screening: (Low Dose CT Chest recommended if Age 70-80 years, 30 pack-year currently smoking OR have quit w/in 15years.) does not qualify.   Vision Screening: Recommended annual ophthalmology exams for early detection of glaucoma and other disorders of the eye. Is the patient up to date with their annual eye exam?  Yes  Office in which the patient attends annual eye exams? [facility]  Dental Screening: Recommended annual dental exams for proper oral hygiene  Community Resource Referral / Chronic Care Management: CRR  required this visit?  No   CCM required this visit?  No     Plan:   Keep all routine maintenance appointments.   Follow up 08/06/19 @ 2:00  I have personally reviewed and noted the following in the patient's chart:   . Medical and social history . Use of alcohol, tobacco or illicit drugs  . Current medications and supplements . Functional ability and status . Nutritional status . Physical activity . Advanced directives . List of other physicians . Hospitalizations, surgeries, and ER visits in previous 12 months . Vitals . Screenings to include cognitive, depression, and falls . Referrals and appointments  In addition, I have reviewed and discussed with patient certain preventive protocols, quality metrics, and best practice recommendations. A written personalized care plan for preventive services as well as general preventive health recommendations were provided to patient via mychart.     Varney Biles, LPN   02/01/5850    Reviewed above information.  Agree with assessment and plan.    Dr Nicki Reaper

## 2019-08-06 ENCOUNTER — Ambulatory Visit: Payer: Medicare HMO | Admitting: Internal Medicine

## 2019-08-29 ENCOUNTER — Other Ambulatory Visit: Payer: Self-pay

## 2019-08-29 ENCOUNTER — Ambulatory Visit (INDEPENDENT_AMBULATORY_CARE_PROVIDER_SITE_OTHER): Payer: Medicare HMO | Admitting: Internal Medicine

## 2019-08-29 ENCOUNTER — Encounter: Payer: Self-pay | Admitting: Internal Medicine

## 2019-08-29 DIAGNOSIS — M25522 Pain in left elbow: Secondary | ICD-10-CM

## 2019-08-29 DIAGNOSIS — R945 Abnormal results of liver function studies: Secondary | ICD-10-CM | POA: Diagnosis not present

## 2019-08-29 DIAGNOSIS — Z125 Encounter for screening for malignant neoplasm of prostate: Secondary | ICD-10-CM

## 2019-08-29 DIAGNOSIS — I10 Essential (primary) hypertension: Secondary | ICD-10-CM | POA: Diagnosis not present

## 2019-08-29 DIAGNOSIS — E7800 Pure hypercholesterolemia, unspecified: Secondary | ICD-10-CM | POA: Diagnosis not present

## 2019-08-29 DIAGNOSIS — M545 Low back pain, unspecified: Secondary | ICD-10-CM

## 2019-08-29 DIAGNOSIS — D649 Anemia, unspecified: Secondary | ICD-10-CM

## 2019-08-29 DIAGNOSIS — Z8719 Personal history of other diseases of the digestive system: Secondary | ICD-10-CM

## 2019-08-29 DIAGNOSIS — R7989 Other specified abnormal findings of blood chemistry: Secondary | ICD-10-CM

## 2019-08-29 DIAGNOSIS — G8929 Other chronic pain: Secondary | ICD-10-CM

## 2019-08-29 LAB — HEPATIC FUNCTION PANEL
ALT: 31 U/L (ref 0–53)
AST: 32 U/L (ref 0–37)
Albumin: 4.2 g/dL (ref 3.5–5.2)
Alkaline Phosphatase: 161 U/L — ABNORMAL HIGH (ref 39–117)
Bilirubin, Direct: 0.1 mg/dL (ref 0.0–0.3)
Total Bilirubin: 0.4 mg/dL (ref 0.2–1.2)
Total Protein: 7.3 g/dL (ref 6.0–8.3)

## 2019-08-29 LAB — BASIC METABOLIC PANEL
BUN: 18 mg/dL (ref 6–23)
CO2: 26 mEq/L (ref 19–32)
Calcium: 9.1 mg/dL (ref 8.4–10.5)
Chloride: 103 mEq/L (ref 96–112)
Creatinine, Ser: 1.06 mg/dL (ref 0.40–1.50)
GFR: 72.01 mL/min (ref >60.00)
Glucose, Bld: 97 mg/dL (ref 70–99)
Potassium: 4.6 mEq/L (ref 3.5–5.1)
Sodium: 135 mEq/L (ref 135–145)

## 2019-08-29 LAB — LIPID PANEL
Cholesterol: 116 mg/dL (ref 0–200)
HDL: 46.8 mg/dL (ref >39.00)
LDL Cholesterol: 49 mg/dL (ref 0–99)
NonHDL: 68.83
Total CHOL/HDL Ratio: 2
Triglycerides: 98 mg/dL (ref 0.0–149.0)
VLDL: 19.6 mg/dL (ref 0.0–40.0)

## 2019-08-29 LAB — LIPASE: Lipase: 35 U/L (ref 11.0–59.0)

## 2019-08-29 LAB — TSH: TSH: 1.1 u[IU]/mL (ref 0.35–4.50)

## 2019-08-29 LAB — PSA, MEDICARE: PSA: 0.7 ng/ml (ref 0.10–4.00)

## 2019-08-29 LAB — AMYLASE: Amylase: 34 U/L (ref 27–131)

## 2019-08-29 NOTE — Progress Notes (Signed)
Patient ID: [name], male   DOB: 08/13/1960, 59 y.o.   MRN: [id number]   Subjective:    Patient ID: [name], male    DOB: 02/18/1960, 59 y.o.   MRN: [id number]  HPI This visit occurred during the SARS-CoV-2 public health emergency.  Safety protocols were in place, including screening questions prior to the visit, additional usage of staff PPE, and extensive cleaning of exam room while observing appropriate contact time as indicated for disinfecting solutions.  Patient here for a scheduled follow up. He reports he is doing relatively well.  Seeing pain clinic.  Relatively stable.  (dry needling and injections - helping).  No chest pain or sob reported.  No increased abdominal pain.  Bowels stable.  Fell one month ago.  Landed on left arm.  Some persistent left arm/elbow pain. Discussed referral to ortho.  Seeing psych.  Some episodes where he will "shut down" with increased stress, but does feel better than last visit.  No increased depression. No suicidal ideations.  Has f/u with psych in 09/2019.     Past Medical History:  Diagnosis Date  . Allergy   . Anoxic brain injury ([facility])   . Chronic back pain    s/p 5 back surgeries  . Coronary artery disease    Cardiac catheterization in 2007 at [facility] showed 30% stenosis in proximal LAD. No other disease. Ejection fraction was 65%  . Depression   . H/O acute pancreatitis   . Hypercholesterolemia   . Mild cognitive impairment with memory loss   . Pancreatitis    Past Surgical History:  Procedure Laterality Date  . abdomial surgery    . BACK SURGERY     multiple back surgeries (5)  . CARDIAC CATHETERIZATION     [facility]   . CHOLECYSTECTOMY  2013  . COLONOSCOPY WITH PROPOFOL N/A 06/30/2015   Procedure: COLONOSCOPY WITH PROPOFOL;  Surgeon: Martin U Skulskie, MD;  Location: [facility] ENDOSCOPY;  Service: Endoscopy;  Laterality: N/A;  . ESOPHAGOGASTRODUODENOSCOPY (EGD) WITH PROPOFOL N/A 03/30/2016   Procedure: ESOPHAGOGASTRODUODENOSCOPY (EGD)  WITH PROPOFOL;  Surgeon: Robert T Elliott, MD;  Location: [facility] ENDOSCOPY;  Service: Endoscopy;  Laterality: N/A;  . FUNCTIONAL ENDOSCOPIC SINUS SURGERY    . JOINT REPLACEMENT    . LAMINECTOMY    . LUMBAR SPINE SURGERY    . SHOULDER ARTHROSCOPY WITH ROTATOR CUFF REPAIR AND OPEN BICEPS TENODESIS Right   . SHOULDER ARTHROSCOPY WITH ROTATOR CUFF REPAIR AND OPEN BICEPS TENODESIS    . SPINE SURGERY    . TRIGGER POINT INJECTION    . VASECTOMY     With Reversal  . VEIN SURGERY Right 2013   arm   Family History  Problem Relation Age of Onset  . Heart disease Father   . Heart attack Father   . Heart disease Mother   . Heart attack Mother   . Heart attack Brother        MI's x 2   . Hypertension Other   . Colon cancer Other   . Hypertension Sister   . Heart attack Paternal Grandfather   . Hypertension Sister    Social History   Socioeconomic History  . Marital status: Divorced    Spouse name: Not on file  . Number of children: 3  . Years of education: Not on file  . Highest education level: Not on file  Occupational History  . Not on file  Tobacco Use  . Smoking status: Never Smoker  . Smokeless tobacco:   Never Used  Vaping Use  . Vaping Use: Never used  Substance and Sexual Activity  . Alcohol use: No    Alcohol/week: 0.0 standard drinks  . Drug use: No  . Sexual activity: Yes  Other Topics Concern  . Not on file  Social History Narrative  . Not on file   Social Determinants of Health   Financial Resource Strain:   . Difficulty of Paying Living Expenses:   Food Insecurity:   . Worried About Running Out of Food in the Last Year:   . Ran Out of Food in the Last Year:   Transportation Needs:   . Lack of Transportation (Medical):   . Lack of Transportation (Non-Medical):   Physical Activity:   . Days of Exercise per Week:   . Minutes of Exercise per Session:   Stress:   . Feeling of Stress :   Social Connections:   . Frequency of Communication with Friends and  Family:   . Frequency of Social Gatherings with Friends and Family:   . Attends Religious Services:   . Active Member of Clubs or Organizations:   . Attends Club or Organization Meetings:   . Marital Status:     Outpatient Encounter Medications as of 08/29/2019  Medication Sig  . amLODipine (NORVASC) 5 MG tablet TAKE 1 TABLET BY MOUTH TWICE A DAY  . aspirin 81 MG chewable tablet Chew 81 mg by mouth daily as needed for moderate pain.   . atomoxetine (STRATTERA) 60 MG capsule Take by mouth daily.  . fluticasone (FLONASE) 50 MCG/ACT nasal spray SPRAY 2 SPRAYS INTO EACH NOSTRIL EVERY DAY  . gabapentin (NEURONTIN) 300 MG capsule Take 600 mg by mouth at bedtime.   . ibuprofen (ADVIL,MOTRIN) 800 MG tablet Take 800 mg by mouth 2 (two) times daily.   . modafinil (PROVIGIL) 200 MG tablet Take 400 mg by mouth daily.   . naproxen (NAPROSYN) 500 MG tablet Take 500 mg by mouth 2 (two) times daily with a meal.  . oxyCODONE (OXY IR/ROXICODONE) 5 MG immediate release tablet Take 5-10 mg by mouth at bedtime as needed for moderate pain.   . pantoprazole (PROTONIX) 40 MG tablet Take 40 mg by mouth 2 (two) times daily.  . promethazine (PHENERGAN) 25 MG tablet Take 1 tablet (25 mg total) by mouth every 4 (four) hours as needed for nausea or vomiting.  . rosuvastatin (CRESTOR) 5 MG tablet TAKE 1 TABLET BY MOUTH EVERY DAY  . sildenafil (REVATIO) 20 MG tablet Take 80-100 mg by mouth daily as needed (for hbp/ed).   . sucralfate (CARAFATE) 1 g tablet Take 1 g by mouth daily.   . tamsulosin (FLOMAX) 0.4 MG CAPS capsule Take 0.4 mg by mouth daily.  . tiZANidine (ZANAFLEX) 4 MG tablet Take 4 mg by mouth at bedtime.    No facility-administered encounter medications on file as of 08/29/2019.    Review of Systems  Constitutional: Negative for appetite change and unexpected weight change.  HENT: Negative for congestion and sinus pressure.   Respiratory: Negative for cough, chest tightness and shortness of breath.    Cardiovascular: Negative for chest pain, palpitations and leg swelling.  Gastrointestinal: Negative for abdominal pain, diarrhea, nausea and vomiting.  Genitourinary: Negative for difficulty urinating and dysuria.  Musculoskeletal: Positive for back pain. Negative for joint swelling and myalgias.       Arm pain - elbow pain - s/p fall.   Skin: Negative for color change and rash.  Neurological: Negative for dizziness,   light-headedness and headaches.  Psychiatric/Behavioral: Negative for agitation and dysphoric mood.       Intermittent increased stress as outlined.         Objective:    Physical Exam Vitals reviewed.  Constitutional:      General: He is not in acute distress.    Appearance: Normal appearance. He is well-developed.  HENT:     Head: Normocephalic and atraumatic.     Right Ear: External ear normal.     Left Ear: External ear normal.  Eyes:     General: No scleral icterus.       Right eye: No discharge.        Left eye: No discharge.     Conjunctiva/sclera: Conjunctivae normal.  Cardiovascular:     Rate and Rhythm: Normal rate and regular rhythm.  Pulmonary:     Effort: Pulmonary effort is normal. No respiratory distress.     Breath sounds: Normal breath sounds.  Abdominal:     General: Bowel sounds are normal.     Palpations: Abdomen is soft.     Tenderness: There is no abdominal tenderness.  Musculoskeletal:        General: No swelling or tenderness.     Cervical back: Neck supple. No tenderness.  Lymphadenopathy:     Cervical: No cervical adenopathy.  Skin:    Findings: No erythema or rash.  Neurological:     Mental Status: He is alert.  Psychiatric:        Mood and Affect: Mood normal.        Behavior: Behavior normal.     BP (!) 168/98   Pulse 92   Temp 98.2 F (36.8 C)   Resp 16   Ht 6' 1" (1.854 m)   Wt (!) 224 lb (101.6 kg)   SpO2 99%   BMI 29.55 kg/m  Wt Readings from Last 3 Encounters:  08/29/19 (!) 224 lb (101.6 kg)  07/23/19 231  lb (104.8 kg)  05/29/19 231 lb (104.8 kg)     Lab Results  Component Value Date   WBC 6.7 05/29/2019   HGB 14.7 05/29/2019   HCT 42.9 05/29/2019   PLT 314.0 05/29/2019   GLUCOSE 97 08/29/2019   CHOL 116 08/29/2019   TRIG 98.0 08/29/2019   HDL 46.80 08/29/2019   LDLCALC 49 08/29/2019   ALT 31 08/29/2019   AST 32 08/29/2019   NA 135 08/29/2019   K 4.6 08/29/2019   CL 103 08/29/2019   CREATININE 1.06 08/29/2019   BUN 18 08/29/2019   CO2 26 08/29/2019   TSH 1.10 08/29/2019   PSA 0.70 08/29/2019   INR 0.9 10/27/2011   HGBA1C 5.3 09/14/2012    US Abdomen Complete  Result Date: 04/12/2018 CLINICAL DATA:  59 year old male with elevated LFTs. History of cholecystectomy. EXAM: ABDOMEN ULTRASOUND COMPLETE COMPARISON:  03/27/2016 CT and 05/08/2015 ultrasound FINDINGS: Gallbladder: Not visualized compatible with cholecystectomy. Common bile duct: Diameter: 6 mm. No intrahepatic or extrahepatic biliary dilatation. Liver: Slightly increased hepatic echogenicity may represent mild hepatic steatosis. No suspicious focal hepatic lesions noted. Portal vein is patent on color Doppler imaging with normal direction of blood flow towards the liver. IVC: No abnormality visualized. Pancreas: Visualized portion unremarkable. Spleen: Size and appearance within normal limits. Right Kidney: Length: 12 cm. A mid renal cyst is present. Echogenicity within normal limits. No solid mass or hydronephrosis visualized. Left Kidney: Length: 12.1 cm. Echogenicity within normal limits. No mass or hydronephrosis visualized. Abdominal aorta: No aneurysm visualized. Other  findings: None. IMPRESSION: 1. Probable mild hepatic steatosis.  No biliary dilatation. 2. No other significant abnormalities. Electronically Signed   By: Margarette Canada M.D.   On: 04/12/2018 14:50       Assessment & Plan:   Problem List Items Addressed This Visit    Abnormal liver function tests    Had recent evaluation for abnormal liver function tests  (including elevated alk phos).  Note reviewed.  Recheck liver panel today.  GI had recommended f/u.       Anemia    Follow cbc.       Chronic back pain    Followed by pain clinic as outlined.  Stable.       Elbow pain    Persistent pain.  Previous injury.  Refer to ortho for evaluation.        Relevant Orders   Ambulatory referral to Orthopedic Surgery   History of pancreatitis    Currently asymptomatic.  Request amylase and lipase check.        Relevant Orders   Amylase (Completed)   Lipase (Completed)   Hypercholesteremia    On crestor.  Low cholesterol diet and exercise.  Follow lipid panel and liver function tests.        Relevant Orders   Lipid panel (Completed)   Hepatic function panel (Completed)   Hypertension    Blood pressure on recheck improved - 132/80.  Will continue amlodipine.  Follow pressures.  Follow metabolic panel.       Relevant Orders   TSH (Completed)   Basic metabolic panel (Completed)    Other Visit Diagnoses    Prostate cancer screening       Relevant Orders   PSA, Medicare (Completed)       Einar Pheasant, MD

## 2019-09-01 ENCOUNTER — Encounter: Payer: Self-pay | Admitting: Internal Medicine

## 2019-09-01 DIAGNOSIS — M25529 Pain in unspecified elbow: Secondary | ICD-10-CM | POA: Insufficient documentation

## 2019-09-01 NOTE — Assessment & Plan Note (Signed)
Blood pressure on recheck improved - 132/80.  Will continue amlodipine.  Follow pressures.  Follow metabolic panel.

## 2019-09-01 NOTE — Assessment & Plan Note (Signed)
Had recent evaluation for abnormal liver function tests (including elevated alk phos).  Note reviewed.  Recheck liver panel today.  GI had recommended f/u.

## 2019-09-01 NOTE — Assessment & Plan Note (Signed)
Currently asymptomatic.  Request amylase and lipase check.

## 2019-09-01 NOTE — Assessment & Plan Note (Signed)
Persistent pain.  Previous injury.  Refer to ortho for evaluation.

## 2019-09-01 NOTE — Assessment & Plan Note (Signed)
On crestor.  Low cholesterol diet and exercise.  Follow lipid panel and liver function tests.   

## 2019-09-01 NOTE — Assessment & Plan Note (Signed)
Followed by pain clinic as outlined.  Stable.

## 2019-09-01 NOTE — Assessment & Plan Note (Signed)
Follow cbc.  

## 2019-09-02 ENCOUNTER — Telehealth: Payer: Self-pay | Admitting: Internal Medicine

## 2019-09-02 NOTE — Telephone Encounter (Signed)
Pt returned your call.  

## 2019-09-02 NOTE — Telephone Encounter (Signed)
See result note.  

## 2019-09-04 ENCOUNTER — Telehealth: Payer: Self-pay | Admitting: Internal Medicine

## 2019-09-04 NOTE — Telephone Encounter (Signed)
I called [address] at Dr Rodena Medin of[facility] I was advised he does not see NP. I called pt to advise pt stated he is not a NPand that his elbow is feeling better and that he does not need the referral and if it flares up again he will call Dr Rod[facility]in of[facility] to sch.

## 2019-09-11 ENCOUNTER — Other Ambulatory Visit: Payer: Self-pay | Admitting: Internal Medicine

## 2019-09-11 DIAGNOSIS — K760 Fatty (change of) liver, not elsewhere classified: Secondary | ICD-10-CM

## 2019-09-11 DIAGNOSIS — R748 Abnormal levels of other serum enzymes: Secondary | ICD-10-CM

## 2019-09-11 NOTE — Progress Notes (Signed)
Order placed for GI referral.   

## 2019-09-23 ENCOUNTER — Observation Stay
Admission: EM | Admit: 2019-09-23 | Discharge: 2019-09-24 | Disposition: A | Discharge status: Home / Self Care | Payer: Medicare HMO | Attending: Family Medicine | Admitting: Family Medicine

## 2019-09-23 ENCOUNTER — Observation Stay (HOSPITAL_BASED_OUTPATIENT_CLINIC_OR_DEPARTMENT_OTHER)
Admit: 2019-09-23 | Discharge: 2019-09-23 | Disposition: A | Discharge status: Home / Self Care | Payer: Medicare HMO | Attending: Family Medicine | Admitting: Family Medicine

## 2019-09-23 ENCOUNTER — Observation Stay: Payer: Medicare HMO

## 2019-09-23 ENCOUNTER — Other Ambulatory Visit: Payer: Self-pay | Admitting: Internal Medicine

## 2019-09-23 ENCOUNTER — Emergency Department: Payer: Medicare HMO

## 2019-09-23 ENCOUNTER — Other Ambulatory Visit: Payer: Self-pay

## 2019-09-23 DIAGNOSIS — Z20822 Contact with and (suspected) exposure to covid-19: Secondary | ICD-10-CM | POA: Insufficient documentation

## 2019-09-23 DIAGNOSIS — R0602 Shortness of breath: Secondary | ICD-10-CM | POA: Diagnosis present

## 2019-09-23 DIAGNOSIS — R531 Weakness: Secondary | ICD-10-CM | POA: Diagnosis not present

## 2019-09-23 DIAGNOSIS — I2510 Atherosclerotic heart disease of native coronary artery without angina pectoris: Secondary | ICD-10-CM | POA: Diagnosis not present

## 2019-09-23 DIAGNOSIS — J323 Chronic sphenoidal sinusitis: Secondary | ICD-10-CM | POA: Diagnosis not present

## 2019-09-23 DIAGNOSIS — R000 Tachycardia, unspecified: Secondary | ICD-10-CM | POA: Insufficient documentation

## 2019-09-23 DIAGNOSIS — J019 Acute sinusitis, unspecified: Secondary | ICD-10-CM | POA: Diagnosis not present

## 2019-09-23 DIAGNOSIS — R55 Syncope and collapse: Secondary | ICD-10-CM

## 2019-09-23 DIAGNOSIS — Z79899 Other long term (current) drug therapy: Secondary | ICD-10-CM | POA: Insufficient documentation

## 2019-09-23 DIAGNOSIS — R413 Other amnesia: Secondary | ICD-10-CM | POA: Diagnosis not present

## 2019-09-23 DIAGNOSIS — E7800 Pure hypercholesterolemia, unspecified: Secondary | ICD-10-CM | POA: Diagnosis not present

## 2019-09-23 DIAGNOSIS — I10 Essential (primary) hypertension: Secondary | ICD-10-CM | POA: Insufficient documentation

## 2019-09-23 DIAGNOSIS — J329 Chronic sinusitis, unspecified: Secondary | ICD-10-CM | POA: Diagnosis present

## 2019-09-23 LAB — BASIC METABOLIC PANEL
Anion gap: 12 (ref 5–15)
BUN: 9 mg/dL (ref 6–20)
CO2: 23 mmol/L (ref 22–32)
Calcium: 9 mg/dL (ref 8.9–10.3)
Chloride: 103 mmol/L (ref 98–111)
Creatinine, Ser: 0.98 mg/dL (ref 0.61–1.24)
GFR calc Af Amer: >60 mL/min (ref >60)
GFR calc non Af Amer: >60 mL/min (ref >60)
Glucose, Bld: 113 mg/dL — ABNORMAL HIGH (ref 70–99)
Potassium: 4.1 mmol/L (ref 3.5–5.1)
Sodium: 138 mmol/L (ref 135–145)

## 2019-09-23 LAB — CBC WITH DIFFERENTIAL/PLATELET
Abs Immature Granulocytes: 0.02 10*3/uL (ref 0.00–0.07)
Basophils Absolute: 0.1 10*3/uL (ref 0.0–0.1)
Basophils Relative: 1 %
Eosinophils Absolute: 0.1 10*3/uL (ref 0.0–0.5)
Eosinophils Relative: 2 %
HCT: 42.2 % (ref 39.0–52.0)
Hemoglobin: 14.6 g/dL (ref 13.0–17.0)
Immature Granulocytes: 0 %
Lymphocytes Relative: 20 %
Lymphs Abs: 1.3 10*3/uL (ref 0.7–4.0)
MCH: 31.1 pg (ref 26.0–34.0)
MCHC: 34.6 g/dL (ref 30.0–36.0)
MCV: 89.8 fL (ref 80.0–100.0)
Monocytes Absolute: 0.8 10*3/uL (ref 0.1–1.0)
Monocytes Relative: 12 %
Neutro Abs: 4.2 10*3/uL (ref 1.7–7.7)
Neutrophils Relative %: 65 %
Platelets: 307 10*3/uL (ref 150–400)
RBC: 4.7 MIL/uL (ref 4.22–5.81)
RDW: 13 % (ref 11.5–15.5)
WBC: 6.4 10*3/uL (ref 4.0–10.5)
nRBC: 0 % (ref 0.0–0.2)

## 2019-09-23 LAB — BRAIN NATRIURETIC PEPTIDE: B Natriuretic Peptide: 25 pg/mL (ref 0.0–100.0)

## 2019-09-23 LAB — TROPONIN I (HIGH SENSITIVITY)
Troponin I (High Sensitivity): 3 ng/L (ref <18)
Troponin I (High Sensitivity): 2 ng/L (ref <18)
Troponin I (High Sensitivity): 3 ng/L (ref <18)

## 2019-09-23 LAB — PHOSPHORUS: Phosphorus: 3.2 mg/dL (ref 2.5–4.6)

## 2019-09-23 LAB — TSH: TSH: 0.729 u[IU]/mL (ref 0.350–4.500)

## 2019-09-23 LAB — CALCIUM: Calcium: 8.9 mg/dL (ref 8.9–10.3)

## 2019-09-23 LAB — SARS CORONAVIRUS 2 BY RT PCR (HOSPITAL ORDER, PERFORMED IN ~~LOC~~ HOSPITAL LAB): SARS Coronavirus 2: NEGATIVE

## 2019-09-23 LAB — MAGNESIUM: Magnesium: 2.1 mg/dL (ref 1.7–2.4)

## 2019-09-23 MED ORDER — SORBITOL 70 % SOLN
30.0000 mL | Freq: Every day | Status: DC | PRN
  Filled 2019-09-23: qty 30

## 2019-09-23 MED ORDER — ALBUTEROL SULFATE (2.5 MG/3ML) 0.083% IN NEBU: 2.5000 mg | INHALATION_SOLUTION | RESPIRATORY_TRACT | Status: DC | PRN

## 2019-09-23 MED ORDER — ACETAMINOPHEN 650 MG RE SUPP: 650.0000 mg | Freq: Four times a day (QID) | RECTAL | Status: DC | PRN

## 2019-09-23 MED ORDER — TAMSULOSIN HCL 0.4 MG PO CAPS
0.4000 mg | ORAL_CAPSULE | Freq: Every day | ORAL | Status: DC
  Administered 2019-09-23 – 2019-09-24 (×2): 0.4 mg via ORAL
  Filled 2019-09-23 (×2): qty 1

## 2019-09-23 MED ORDER — HEPARIN SODIUM (PORCINE) 5000 UNIT/ML IJ SOLN
5000.0000 [IU] | Freq: Three times a day (TID) | INTRAMUSCULAR | Status: DC
  Administered 2019-09-23 – 2019-09-24 (×2): 5000 [IU] via SUBCUTANEOUS
  Filled 2019-09-23 (×2): qty 1

## 2019-09-23 MED ORDER — ATOMOXETINE HCL 60 MG PO CAPS
60.0000 mg | ORAL_CAPSULE | Freq: Every day | ORAL | Status: DC
  Administered 2019-09-23 – 2019-09-24 (×2): 60 mg via ORAL
  Filled 2019-09-23 (×4): qty 1

## 2019-09-23 MED ORDER — SODIUM CHLORIDE 0.9 % IV SOLN: INTRAVENOUS | Status: DC

## 2019-09-23 MED ORDER — LEVALBUTEROL HCL 0.63 MG/3ML IN NEBU: 0.6300 mg | INHALATION_SOLUTION | Freq: Four times a day (QID) | RESPIRATORY_TRACT | Status: DC | PRN

## 2019-09-23 MED ORDER — SENNOSIDES-DOCUSATE SODIUM 8.6-50 MG PO TABS: 1.0000 | ORAL_TABLET | Freq: Every evening | ORAL | Status: DC | PRN

## 2019-09-23 MED ORDER — DOCUSATE SODIUM 100 MG PO CAPS
100.0000 mg | ORAL_CAPSULE | Freq: Two times a day (BID) | ORAL | Status: DC
  Administered 2019-09-24: 100 mg via ORAL
  Filled 2019-09-23 (×2): qty 1

## 2019-09-23 MED ORDER — FLUTICASONE PROPIONATE 50 MCG/ACT NA SUSP: 1.0000 | Freq: Every day | NASAL | Status: DC

## 2019-09-23 MED ORDER — SUCRALFATE 1 G PO TABS
1.0000 g | ORAL_TABLET | Freq: Every day | ORAL | Status: DC
  Administered 2019-09-23 – 2019-09-24 (×2): 1 g via ORAL
  Filled 2019-09-23 (×2): qty 1

## 2019-09-23 MED ORDER — IBUPROFEN 400 MG PO TABS
800.0000 mg | ORAL_TABLET | Freq: Two times a day (BID) | ORAL | Status: DC
  Administered 2019-09-23 – 2019-09-24 (×2): 800 mg via ORAL
  Filled 2019-09-23: qty 1
  Filled 2019-09-23: qty 2

## 2019-09-23 MED ORDER — ROSUVASTATIN CALCIUM 5 MG PO TABS
5.0000 mg | ORAL_TABLET | Freq: Every day | ORAL | Status: DC
  Administered 2019-09-23: 5 mg via ORAL

## 2019-09-23 MED ORDER — MODAFINIL 200 MG PO TABS: 400.0000 mg | ORAL_TABLET | Freq: Every day | ORAL | Status: DC

## 2019-09-23 MED ORDER — TIZANIDINE HCL 4 MG PO TABS
4.0000 mg | ORAL_TABLET | Freq: Every day | ORAL | Status: DC
  Administered 2019-09-23: 4 mg via ORAL
  Filled 2019-09-23 (×2): qty 1

## 2019-09-23 MED ORDER — ASPIRIN EC 81 MG PO TBEC
81.0000 mg | DELAYED_RELEASE_TABLET | Freq: Every day | ORAL | Status: DC
  Administered 2019-09-24: 81 mg via ORAL
  Filled 2019-09-23: qty 1

## 2019-09-23 MED ORDER — MODAFINIL 100 MG PO TABS
400.0000 mg | ORAL_TABLET | Freq: Every day | ORAL | Status: DC
  Administered 2019-09-24: 400 mg via ORAL
  Filled 2019-09-23: qty 2
  Filled 2019-09-23: qty 4

## 2019-09-23 MED ORDER — PANTOPRAZOLE SODIUM 40 MG PO TBEC
40.0000 mg | DELAYED_RELEASE_TABLET | Freq: Two times a day (BID) | ORAL | Status: DC
  Administered 2019-09-23 – 2019-09-24 (×2): 40 mg via ORAL
  Filled 2019-09-23 (×2): qty 1

## 2019-09-23 MED ORDER — ROSUVASTATIN CALCIUM 5 MG PO TABS: 5.0000 mg | ORAL_TABLET | Freq: Every day | ORAL | Status: DC

## 2019-09-23 MED ORDER — ACETAMINOPHEN 325 MG PO TABS: 650.0000 mg | ORAL_TABLET | Freq: Four times a day (QID) | ORAL | Status: DC | PRN

## 2019-09-23 MED ORDER — ONDANSETRON HCL 4 MG PO TABS: 4.0000 mg | ORAL_TABLET | Freq: Four times a day (QID) | ORAL | Status: DC | PRN

## 2019-09-23 MED ORDER — AMLODIPINE BESYLATE 5 MG PO TABS
5.0000 mg | ORAL_TABLET | Freq: Two times a day (BID) | ORAL | Status: DC
  Administered 2019-09-23: 5 mg via ORAL
  Filled 2019-09-23: qty 1

## 2019-09-23 MED ORDER — OXYCODONE HCL 5 MG PO TABS
5.0000 mg | ORAL_TABLET | ORAL | Status: DC | PRN
  Administered 2019-09-23: 5 mg via ORAL
  Filled 2019-09-23: qty 1

## 2019-09-23 MED ORDER — FLUTICASONE PROPIONATE 50 MCG/ACT NA SUSP
1.0000 | Freq: Every day | NASAL | Status: DC
  Filled 2019-09-23 (×2): qty 16

## 2019-09-23 MED ORDER — GABAPENTIN 300 MG PO CAPS
600.0000 mg | ORAL_CAPSULE | Freq: Every day | ORAL | Status: DC
  Administered 2019-09-23: 600 mg via ORAL
  Filled 2019-09-23: qty 2

## 2019-09-23 MED ORDER — IOHEXOL 350 MG/ML SOLN
100.0000 mL | Freq: Once | INTRAVENOUS | Status: AC | PRN
  Administered 2019-09-23: 100 mL via INTRAVENOUS
  Filled 2019-09-23: qty 100

## 2019-09-23 MED ORDER — SODIUM CHLORIDE 0.9% FLUSH
3.0000 mL | Freq: Two times a day (BID) | INTRAVENOUS | Status: DC
  Administered 2019-09-23 – 2019-09-24 (×2): 3 mL via INTRAVENOUS

## 2019-09-23 MED ORDER — AZITHROMYCIN 250 MG PO TABS
500.0000 mg | ORAL_TABLET | Freq: Every day | ORAL | Status: DC
  Administered 2019-09-23 – 2019-09-24 (×2): 500 mg via ORAL
  Filled 2019-09-23: qty 2

## 2019-09-23 MED ORDER — ATOMOXETINE HCL 60 MG PO CAPS: 60.0000 mg | ORAL_CAPSULE | Freq: Every day | ORAL | Status: DC

## 2019-09-23 MED ORDER — ONDANSETRON HCL 4 MG/2ML IJ SOLN
4.0000 mg | Freq: Four times a day (QID) | INTRAMUSCULAR | Status: DC | PRN
  Administered 2019-09-24: 4 mg via INTRAVENOUS
  Filled 2019-09-23: qty 2

## 2019-09-23 MED ORDER — OXYCODONE HCL 5 MG PO TABS
5.0000 mg | ORAL_TABLET | Freq: Every evening | ORAL | Status: DC | PRN
Start: 2019-09-23 — End: 2019-09-23

## 2019-09-23 NOTE — ED Provider Notes (Signed)
[facility] Emergency Department Provider Note  ____________________________________________   First MD Initiated Contact with Patient 09/23/19 1209     (approximate)  I have reviewed the triage vital signs and the nursing notes.   HISTORY  Chief Complaint Shortness of Breath    HPI [name] is a 59 y.o. male presents emergency department complaining of cough, congestion, shortness of breath and weakness since last Wednesday.  Patient has not been tested for Covid however if he did go to [place] [facility] today to be tested and leaned against the window and proceeded to pass out.  States last thing he remembered was leaning on the window and then nurses and doctors were standing above him.  He denies headache at this time.  No leg pain.  No chest pain.    Past Medical History:  Diagnosis Date  . Allergy   . Anoxic brain injury ([place])   . Chronic back pain    s/p 5 back surgeries  . Coronary artery disease    Cardiac catheterization in 2007 at [facility] showed 30% stenosis in proximal LAD. No other disease. Ejection fraction was 65%  . Depression   . H/O acute pancreatitis   . Hypercholesterolemia   . Mild cognitive impairment with memory loss   . Pancreatitis     Patient Active Problem List   Diagnosis Date Noted  . Sinusitis 09/23/2019  . Elbow pain 09/01/2019  . Syncope 05/29/2019  . Urinary hesitancy 01/20/2019  . Medication monitoring encounter 10/01/2017  . Abdominal pain 02/15/2016  . Fatigue 02/15/2016  . Chest pain 11/01/2015  . Rash 11/01/2015  . Diverticulosis of colon without hemorrhage 07/04/2015  . Abnormal liver function tests 05/03/2015  . Left knee pain 04/08/2015  . Complete rotator cuff rupture of left shoulder 03/26/2015  . Hypertension 03/01/2015  . Fungal infection of foot 10/18/2014  . Health care maintenance 06/16/2014  . History of pancreatitis 06/12/2014  . Sinus tachycardia 06/27/2013  . DOE (dyspnea on  exertion) 06/04/2013  . Sebaceous cyst 02/05/2013  . Hyponatremia 11/04/2012  . Short-term memory loss 05/06/2012  . Anemia 05/06/2012  . Neck fullness 03/27/2012  . Chronic back pain 12/01/2011  . Hypercholesteremia 12/01/2011  . Clinical depression 07/29/2011  . Failed back syndrome of lumbar spine 07/22/2011    Past Surgical History:  Procedure Laterality Date  . abdomial surgery    . BACK SURGERY     multiple back surgeries (5)  . CARDIAC CATHETERIZATION     [facility]   . CHOLECYSTECTOMY  2013  . COLONOSCOPY WITH PROPOFOL N/A 06/30/2015   Procedure: COLONOSCOPY WITH PROPOFOL;  Surgeon: Lollie Sails, MD;  Location: [facility] ENDOSCOPY;  Service: Endoscopy;  Laterality: N/A;  . ESOPHAGOGASTRODUODENOSCOPY (EGD) WITH PROPOFOL N/A 03/30/2016   Procedure: ESOPHAGOGASTRODUODENOSCOPY (EGD) WITH PROPOFOL;  Surgeon: Manya Silvas, MD;  Location: [facility] ENDOSCOPY;  Service: Endoscopy;  Laterality: N/A;  . FUNCTIONAL ENDOSCOPIC SINUS SURGERY    . JOINT REPLACEMENT    . LAMINECTOMY    . LUMBAR SPINE SURGERY    . SHOULDER ARTHROSCOPY WITH ROTATOR CUFF REPAIR AND OPEN BICEPS TENODESIS Right   . SHOULDER ARTHROSCOPY WITH ROTATOR CUFF REPAIR AND OPEN BICEPS TENODESIS    . SPINE SURGERY    . TRIGGER POINT INJECTION    . VASECTOMY     With Reversal  . VEIN SURGERY Right 2013   arm    Prior to Admission medications   Medication Sig Start Date End Date Taking? Authorizing Provider  amLODipine (NORVASC) 5 MG tablet TAKE 1 TABLET BY MOUTH TWICE A DAY 09/23/19   Einar Pheasant, MD  aspirin 81 MG chewable tablet Chew 81 mg by mouth daily as needed for moderate pain.     [provider]  atomoxetine (STRATTERA) 60 MG capsule Take by mouth daily. 06/16/18   [provider]  fluticasone (FLONASE) 50 MCG/ACT nasal spray SPRAY 2 SPRAYS INTO EACH NOSTRIL EVERY DAY 11/08/18   Einar Pheasant, MD  gabapentin (NEURONTIN) 300 MG capsule Take 600 mg by mouth at bedtime.     [provider]  ibuprofen (ADVIL,MOTRIN) 800 MG tablet Take 800 mg by mouth 2 (two) times daily.     [provider]  modafinil (PROVIGIL) 200 MG tablet Take 400 mg by mouth daily.     [provider]  naproxen (NAPROSYN) 500 MG tablet Take 500 mg by mouth 2 (two) times daily with a meal.    [provider]  oxyCODONE (OXY IR/ROXICODONE) 5 MG immediate release tablet Take 5-10 mg by mouth at bedtime as needed for moderate pain.     [provider]  pantoprazole (PROTONIX) 40 MG tablet Take 40 mg by mouth 2 (two) times daily. 11/05/18   [provider]  promethazine (PHENERGAN) 25 MG tablet Take 1 tablet (25 mg total) by mouth every 4 (four) hours as needed for nausea or vomiting. 05/27/17   Earleen Newport, MD  rosuvastatin (CRESTOR) 5 MG tablet TAKE 1 TABLET BY MOUTH EVERY DAY 05/27/19   Einar Pheasant, MD  sildenafil (REVATIO) 20 MG tablet Take 80-100 mg by mouth daily as needed (for hbp/ed).     [provider]  sucralfate (CARAFATE) 1 g tablet Take 1 g by mouth daily.     [provider]  tamsulosin (FLOMAX) 0.4 MG CAPS capsule Take 0.4 mg by mouth daily. 03/28/19   [provider]  tiZANidine (ZANAFLEX) 4 MG tablet Take 4 mg by mouth at bedtime.     [provider]    Allergies Corticosteroids, Penicillins, and Sulfa antibiotics  Family History  Problem Relation Age of Onset  . Heart disease Father   . Heart attack Father   . Heart disease Mother   . Heart attack Mother   . Heart attack Brother        MI's x 2   . Hypertension Other   . Colon cancer Other   . Hypertension Sister   . Heart attack Paternal Grandfather   . Hypertension Sister     Social History Social History   Tobacco Use  . Smoking status: Never Smoker  . Smokeless tobacco: Never Used  Vaping Use  . Vaping Use: Never used  Substance Use Topics  . Alcohol use: No    Alcohol/week: 0.0 standard drinks  . Drug use: No    Review of  Systems  Constitutional: Positive fever/chills Eyes: No visual changes. ENT: No sore throat. Respiratory: Positive cough and shortness of breath Cardiovascular: Denies cardiac type chest pain Gastrointestinal: Denies abdominal pain Genitourinary: Negative for dysuria. Musculoskeletal: Negative for back pain. Skin: Negative for rash. Psychiatric: no mood changes,     ____________________________________________   PHYSICAL EXAM:  VITAL SIGNS: ED Triage Vitals  Enc Vitals Group     BP 09/23/19 0938 122/77     Pulse Rate 09/23/19 0938 (!) 106     Resp 09/23/19 0938 (!) 22     Temp 09/23/19 0938 99 F (37.2 C)     Temp  Source 09/23/19 0938 Oral     SpO2 09/23/19 0938 95 %     Weight 09/23/19 0949 232 lb (105.2 kg)     Height 09/23/19 0949 6\' 1"  (1.854 m)     Head Circumference --      Peak Flow --      Pain Score 09/23/19 0949 9     Pain Loc --      Pain Edu? --      Excl. in [place]? --     Constitutional: Alert and oriented. Well appearing and in no acute distress. Eyes: Conjunctivae are normal.  Head: Atraumatic. Nose: No congestion/rhinnorhea. Mouth/Throat: Mucous membranes are moist.   Neck:  supple no lymphadenopathy noted Cardiovascular: Normal rate, regular rhythm. Heart sounds are normal Respiratory: Normal respiratory effort.  No retractions, lungs c t a  Abd: soft nontender bs normal all 4 quad GU: deferred Musculoskeletal: FROM all extremities, warm and well perfused Neurologic:  Normal speech and language.  Skin:  Skin is warm, dry and intact. No rash noted. Psychiatric: Mood and affect are normal. Speech and behavior are normal.  ____________________________________________   LABS (all labs ordered are listed, but only abnormal results are displayed)  Labs Reviewed  BASIC METABOLIC PANEL - Abnormal; Notable for the following components:      Result Value   Glucose, Bld 113 (*)    All other components within normal limits  SARS CORONAVIRUS 2 BY RT  PCR (HOSPITAL ORDER, [place] [facility])  CBC WITH DIFFERENTIAL/PLATELET  HIV ANTIBODY (ROUTINE TESTING W REFLEX)  CALCIUM  MAGNESIUM  PHOSPHORUS  BRAIN NATRIURETIC PEPTIDE  TSH  HEMOGLOBIN A1C  URINALYSIS, ROUTINE W REFLEX MICROSCOPIC  BASIC METABOLIC PANEL  PROTIME-INR  APTT  TROPONIN I (HIGH SENSITIVITY)  TROPONIN I (HIGH SENSITIVITY)  TROPONIN I (HIGH SENSITIVITY)   ____________________________________________   ____________________________________________  RADIOLOGY  Chest x-ray is normal CT of the head due to syncope and weakness  CTA for PE ordered due to shortness of breath  ____________________________________________   PROCEDURES  Procedure(s) performed: No  Procedures    ____________________________________________   INITIAL IMPRESSION / ASSESSMENT AND PLAN / ED COURSE  Pertinent labs & imaging results that were available during my care of the patient were reviewed by me and considered in my medical decision making (see chart for details).   The patient is a 59 year old male presents emergency department with complaints of fever, chills, shortness of breath and weakness since last Wednesday.  See HPI  Physical exam shows patient to appear stable.  Exam is basically unremarkable.  DDx: Covid, MI, sepsis, PE, community-acquired pneumonia  Chest x-ray is normal CBC is normal, troponin is normal, basic metabolic panel shows elevated glucose of 113, Covid test is negative  I do not feel the patient has sepsis with the normal CBC, patient's troponins have been normal,  Due to the syncope and the patient just does not appear to be well, to decide to admit him for observation due to the syncope, tachycardia, and weakness.  Patient agrees for admission.  I did discuss this with Dr. Ellender Hose.    I did page the hospitalist who is assuming care at this time    [name] was evaluated in Emergency Department on 09/23/2019 for the  symptoms described in the history of present illness. He was evaluated in the context of the global COVID-19 pandemic, which necessitated consideration that the patient might be at risk for infection with the SARS-CoV-2 virus that causes  COVID-19. Institutional protocols and algorithms that pertain to the evaluation of patients at risk for COVID-19 are in a state of rapid change based on information released by regulatory bodies including the CDC and federal and state organizations. These policies and algorithms were followed during the patient's care in the ED.    As part of my medical decision making, I reviewed the following data within the [facility] notes reviewed and incorporated, Labs reviewed , EKG interpreted tachycardia, Old chart reviewed, Radiograph reviewed , Discussed with admitting physician , Notes from prior ED visits and [place] Controlled Substance Database  ____________________________________________   FINAL CLINICAL IMPRESSION(S) / ED DIAGNOSES  Final diagnoses:  Syncope, unspecified syncope type  Weakness  Tachycardia      NEW MEDICATIONS STARTED DURING THIS VISIT:  New Prescriptions   No medications on file     Note:  This document was prepared using Dragon voice recognition software and may include unintentional dictation errors.    Versie Starks, PA-C 09/23/19 1707    Duffy Bruce, MD 09/25/19 [phone]

## 2019-09-23 NOTE — ED Notes (Signed)
Pt walked to bathroom steady gait. O2 sats remain 97% room air

## 2019-09-23 NOTE — Progress Notes (Signed)
*  PRELIMINARY RESULTS* Echocardiogram 2D Echocardiogram has been performed.  [name] 09/23/2019, 7:54 PM

## 2019-09-23 NOTE — Progress Notes (Signed)
*  PRELIMINARY RESULTS* Echocardiogram 2D Echocardiogram has been performed.  [name] [name] 09/23/2019, 7:54 PM

## 2019-09-23 NOTE — ED Notes (Signed)
Lab contacted to add trop 

## 2019-09-23 NOTE — H&P (Signed)
History and Physical   Patient: [name]                            PCP: Einar Pheasant, MD                    DOB: [birth year]            DOA: 09/23/2019 [id number]             DOS: 09/23/2019, 5:03 PM  Patient coming from:   HOME I have personally reviewed patient's medical records, in electronic medical records, including:  [place] link, and care everywhere.    Chief Complaint:   Chief Complaint  Patient presents with  . Shortness of Breath  SYNCPE  History of present illness:    [name] is a 59 y.o. male with medical history significant history of anoxic brain injury, chronic back pain status post 5 back surgeries, coronary artery disease (2007 at [facility]-30% stenosis of proximal LAD, ejection fraction 65%) depression, history of pancreatitis, hypercholesterolemia, hypertension GERD, presented with episode of passing out spell..  Patient presented to the ED with chief complaint of cough congestion shortness of breath weakness since last Wednesday.  He was at the [facility] clinic today where he was tested negative for Covid, was leaning against the window where he passed out. Stating last thing he remembers was leaning towards the window then nurses and doctors were standing over him.   Patient Denies having: Fever, Chills.only complaining of cough, congestion, mild shortness of breath since Wednesday.  But denies of having  Chest Pain, Abd pain, N/V/D, headache, dizziness, lightheadedness,  Dysuria, Joint pain, rash, open wounds  ED Course:   Hemodynamically stable: T-max 99 currently 98.3, heart rate 98-1 06 respiratory rate 20-22 currently 20, blood pressure 134/91, satting 98% on room air CBC CMP within normal limits,  CTA-negative for acute PE,Mild dilatation of the ascending aorta 3.9 cm Chest x-ray within normal limits. CT of the head negative, mild chronic sinusitis  SARS-CoV-2 negative again in ED by PCR  Review of Systems: As per HPI, otherwise 10  point review of systems were negative.   ----------------------------------------------------------------------------------------------------------------------  Allergies  Allergen Reactions  . Corticosteroids Other (See Comments)    Pancreatitis  . Penicillins Anaphylaxis    Has patient had a PCN reaction causing immediate rash, facial/tongue/throat swelling, SOB or lightheadedness with hypotension: no Has patient had a PCN reaction causing severe rash involving mucus membranes or skin necrosis: yes Has patient had a PCN reaction that required hospitalization: yes Has patient had a PCN reaction occurring within the last 10 years: no If all of the above answers are "NO", then may proceed with Cephalosporin use.   . Sulfa Antibiotics Other (See Comments)    Unknown reaction    Home MEDs:  Prior to Admission medications   Medication Sig Start Date End Date Taking? Authorizing Provider  amLODipine (NORVASC) 5 MG tablet TAKE 1 TABLET BY MOUTH TWICE A DAY 09/23/19   Einar Pheasant, MD  aspirin 81 MG chewable tablet Chew 81 mg by mouth daily as needed for moderate pain.     [provider]  atomoxetine (STRATTERA) 60 MG capsule Take by mouth daily. 06/16/18   [provider]  fluticasone (FLONASE) 50 MCG/ACT nasal spray SPRAY 2 SPRAYS INTO EACH NOSTRIL EVERY DAY 11/08/18   Einar Pheasant, MD  gabapentin (NEURONTIN) 300 MG capsule Take 600 mg by mouth at  bedtime.     [provider]  ibuprofen (ADVIL,MOTRIN) 800 MG tablet Take 800 mg by mouth 2 (two) times daily.     [provider]  modafinil (PROVIGIL) 200 MG tablet Take 400 mg by mouth daily.     [provider]  naproxen (NAPROSYN) 500 MG tablet Take 500 mg by mouth 2 (two) times daily with a meal.    [provider]  oxyCODONE (OXY IR/ROXICODONE) 5 MG immediate release tablet Take 5-10 mg by mouth at bedtime as needed for moderate pain.     [provider]  pantoprazole  (PROTONIX) 40 MG tablet Take 40 mg by mouth 2 (two) times daily. 11/05/18   [provider]  promethazine (PHENERGAN) 25 MG tablet Take 1 tablet (25 mg total) by mouth every 4 (four) hours as needed for nausea or vomiting. 05/27/17   Earleen Newport, MD  rosuvastatin (CRESTOR) 5 MG tablet TAKE 1 TABLET BY MOUTH EVERY DAY 05/27/19   Einar Pheasant, MD  sildenafil (REVATIO) 20 MG tablet Take 80-100 mg by mouth daily as needed (for hbp/ed).     [provider]  sucralfate (CARAFATE) 1 g tablet Take 1 g by mouth daily.     [provider]  tamsulosin (FLOMAX) 0.4 MG CAPS capsule Take 0.4 mg by mouth daily. 03/28/19   [provider]  tiZANidine (ZANAFLEX) 4 MG tablet Take 4 mg by mouth at bedtime.     [provider]    PRN MEDs: acetaminophen **OR** acetaminophen, albuterol, levalbuterol, ondansetron **OR** ondansetron (ZOFRAN) IV, oxyCODONE, senna-docusate, sorbitol  Past Medical History:  Diagnosis Date  . Allergy   . Anoxic brain injury ([place])   . Chronic back pain    s/p 5 back surgeries  . Coronary artery disease    Cardiac catheterization in 2007 at [facility] showed 30% stenosis in proximal LAD. No other disease. Ejection fraction was 65%  . Depression   . H/O acute pancreatitis   . Hypercholesterolemia   . Mild cognitive impairment with memory loss   . Pancreatitis     Past Surgical History:  Procedure Laterality Date  . abdomial surgery    . BACK SURGERY     multiple back surgeries (5)  . CARDIAC CATHETERIZATION     [facility]   . CHOLECYSTECTOMY  2013  . COLONOSCOPY WITH PROPOFOL N/A 06/30/2015   Procedure: COLONOSCOPY WITH PROPOFOL;  Surgeon: Lollie Sails, MD;  Location: [facility] ENDOSCOPY;  Service: Endoscopy;  Laterality: N/A;  . ESOPHAGOGASTRODUODENOSCOPY (EGD) WITH PROPOFOL N/A 03/30/2016   Procedure: ESOPHAGOGASTRODUODENOSCOPY (EGD) WITH PROPOFOL;  Surgeon: Manya Silvas, MD;  Location: [facility] ENDOSCOPY;  Service: Endoscopy;   Laterality: N/A;  . FUNCTIONAL ENDOSCOPIC SINUS SURGERY    . JOINT REPLACEMENT    . LAMINECTOMY    . LUMBAR SPINE SURGERY    . SHOULDER ARTHROSCOPY WITH ROTATOR CUFF REPAIR AND OPEN BICEPS TENODESIS Right   . SHOULDER ARTHROSCOPY WITH ROTATOR CUFF REPAIR AND OPEN BICEPS TENODESIS    . SPINE SURGERY    . TRIGGER POINT INJECTION    . VASECTOMY     With Reversal  . VEIN SURGERY Right 2013   arm     reports that he has never smoked. He has never used smokeless tobacco. He reports that he does not drink alcohol and does not use drugs.   Family History  Problem Relation Age of Onset  . Heart disease Father   . Heart attack Father   . Heart disease  Mother   . Heart attack Mother   . Heart attack Brother        MI's x 2   . Hypertension Other   . Colon cancer Other   . Hypertension Sister   . Heart attack Paternal Grandfather   . Hypertension Sister     Physical Exam:   Vitals:   09/23/19 0949 09/23/19 1211 09/23/19 1400 09/23/19 1546  BP:  (!) 140/98 (!) 134/91   Pulse:  98 98   Resp:   20   Temp:  97.8 F (36.6 C) 97.8 F (36.6 C) 98.3 F (36.8 C)  TempSrc:  Oral Oral Oral  SpO2:  98% 98%   Weight: 105.2 kg     Height: 6\' 1"  (1.854 m)      Constitutional: NAD, calm, comfortable Eyes: PERRL, lids and conjunctivae normal ENMT: Mucous membranes are moist. Posterior pharynx clear of any exudate or lesions.Normal dentition.  Neck: normal, supple, no masses, no thyromegaly Respiratory: clear to auscultation bilaterally, no wheezing, no crackles. Normal respiratory effort. No accessory muscle use.  Cardiovascular: Regular rate and rhythm, no murmurs / rubs / gallops. No extremity edema. 2+ pedal pulses. No carotid bruits.  Abdomen: no tenderness, no masses palpated. No hepatosplenomegaly. Bowel sounds positive.  Musculoskeletal: no clubbing / cyanosis. No joint deformity upper and lower extremities. Good ROM, no contractures. Normal muscle tone.  Neurologic: CN II-XII  grossly intact. Sensation intact, DTR normal. Strength 5/5 in all 4.  Psychiatric: Normal judgment and insight. Alert and oriented x 3. Normal mood.  Skin: no rashes, lesions, ulcers. No induration Wounds: per nursing documentation         Labs on admission:    I have personally reviewed following labs and imaging studies  CBC: Recent Labs  Lab 09/23/19 0940  WBC 6.4  NEUTROABS 4.2  HGB 14.6  HCT 42.2  MCV 89.8  PLT 397   Basic Metabolic Panel: Recent Labs  Lab 09/23/19 0940  NA 138  K 4.1  CL 103  CO2 23  GLUCOSE 113*  BUN 9  CREATININE 0.98  CALCIUM 9.0   Urine analysis:    Component Value Date/Time   COLORURINE COLORLESS (A) 03/27/2016 1032   APPEARANCEUR CLEAR (A) 03/27/2016 1032   APPEARANCEUR Clear 10/27/2011 1256   LABSPEC 1.003 (L) 03/27/2016 1032   LABSPEC 1.011 10/27/2011 1256   PHURINE 7.0 03/27/2016 1032   GLUCOSEU NEGATIVE 03/27/2016 1032   GLUCOSEU Negative 10/27/2011 1256   HGBUR NEGATIVE 03/27/2016 1032   BILIRUBINUR NEGATIVE 03/27/2016 1032   BILIRUBINUR Negative 10/27/2011 [place] 03/27/2016 1032   PROTEINUR NEGATIVE 03/27/2016 1032   NITRITE NEGATIVE 03/27/2016 1032   LEUKOCYTESUR NEGATIVE 03/27/2016 1032   LEUKOCYTESUR Negative 10/27/2011 1256     Radiologic Exams on Admission:   DG Chest 2 View  Result Date: 09/23/2019 CLINICAL DATA:  Cough and congestion.  Shortness of breath. EXAM: CHEST - 2 VIEW COMPARISON:  10/10/2017 FINDINGS: Asymmetric elevation of right hemidiaphragm is unchanged. Heart size is normal. No pleural effusion or edema identified. No airspace consolidation identified. IMPRESSION: 1. No acute cardiopulmonary abnormalities. Electronically Signed   By: Kerby Moors M.D.   On: 09/23/2019 10:36   CT Head Wo Contrast  Result Date: 09/23/2019 CLINICAL DATA:  Syncope EXAM: CT HEAD WITHOUT CONTRAST TECHNIQUE: Contiguous axial images were obtained from the base of the skull through the vertex  without intravenous contrast. COMPARISON:  10/10/2017 FINDINGS: Brain: No acute intracranial abnormality. Specifically, no hemorrhage, hydrocephalus, mass  lesion, acute infarction, or significant intracranial injury. Vascular: No hyperdense vessel or unexpected calcification. Skull: No acute calvarial abnormality. Sinuses/Orbits: Mucosal thickening throughout the paranasal sinuses. No air-fluid levels. Other: None IMPRESSION: No intracranial abnormality. Mild chronic sinusitis. Electronically Signed   By: Rolm Baptise M.D.   On: 09/23/2019 15:11   CT Angio Chest PE W and/or Wo Contrast  Result Date: 09/23/2019 CLINICAL DATA:  Cough and congestion with shortness of breath for several days EXAM: CT ANGIOGRAPHY CHEST WITH CONTRAST TECHNIQUE: Multidetector CT imaging of the chest was performed using the standard protocol during bolus administration of intravenous contrast. Multiplanar CT image reconstructions and MIPs were obtained to evaluate the vascular anatomy. CONTRAST:  122mL OMNIPAQUE IOHEXOL 350 MG/ML SOLN COMPARISON:  Chest x-ray from earlier in the same day. FINDINGS: Cardiovascular: Thoracic aorta demonstrates atherosclerotic calcifications with mild dilatation of the ascending aorta to 3.9 cm. No dissection is seen. Pulmonary artery shows a normal branching pattern. No filling defects to suggest pulmonary emboli are identified. No cardiac enlargement is noted. Mediastinum/Nodes: Thoracic inlet is within normal limits. No sizable hilar or mediastinal adenopathy is noted. The esophagus as visualized is within normal limits. Small sliding-type hiatal hernia is noted. Lungs/Pleura: Lungs are well aerated bilaterally. Mild dependent atelectatic changes are noted. No sizable effusion or focal infiltrate is seen. Upper Abdomen: Visualized upper abdomen shows changes of prior cholecystectomy. No other focal abnormality is seen. Musculoskeletal: No chest wall abnormality. No acute or significant osseous findings.  Review of the MIP images confirms the above findings. IMPRESSION: No definitive pulmonary emboli identified. Mild dilatation of the ascending aorta 3.9 cm. No dissection is seen. No focal infiltrate is seen. Aortic Atherosclerosis (ICD10-I70.0). Electronically Signed   By: Inez Catalina M.D.   On: 09/23/2019 15:12    EKG:   Independently reviewed.  Orders placed or performed during the hospital encounter of 09/23/19  . EKG 12-Lead  . EKG 12-Lead  . ED EKG  . ED EKG  . ED EKG  . ED EKG  . EKG 12-Lead   ---------------------------------------------------------------------------------------------------------------------------------------    Assessment / Plan:   Principal Problem:   Syncope Active Problems:   Sinusitis   Hypercholesteremia   Short-term memory loss   Sinus tachycardia   Hypertension   Principal Problem:  Syncope -Patient will be admitted to medical floor monitor bed for close observation -We will continue with neurochecks - satting 98% on room air -CTA-negative for acute PE,Mild dilatation of the ascending aorta 3.9 cm -Chest x-ray within normal limits. -CT of the head negative, mild chronic sinusitis -We will continue to monitor ruling out dysrhythmia -Continue neurochecks -We will pursue bilateral carotid studies, orthostatic blood pressure checks -2D echocardiogram  SARS-CoV-2 negative again in ED by PC  Acute on chronic sinusitis -Present, confirmed on CT angiogram -We will initiate empiric antibiotic azithromycin -T-max 99, WBC of 6.4, no signs of sirs or sepsis     Hypercholesteremia -We will continue statins-Crestor,    Short-term memory loss -We will monitor closely -No history of dementia currently not any medication    Sinus tachycardia -We will monitor on telemetry bed    Hypertension  -we will resume her home medication Including Norvasc,  coronary artery disease  (2007 at [facility]-30% stenosis of proximal LAD, ejection fraction  65%)  -No complaint of chest pain -We will resume home medication    GERD  continue Protonix, Carafate,  Chronic neck/back pain -On chronic muscle relaxants, pain medication regimen including Neurontin, NSAIDs, -Patient was informed that  this polypharmacy may also cause alteration, and mental status, syncopal Reports that he has been taking it for long time due to failed back surgeries     Cultures:  - none  Antimicrobial: -Azithromycin  Consults called:  None -------------------------------------------------------------------------------------------------------------------------------------------- DVT prophylaxis: SCD/Compression stockings and Heparin SQ Code Status:   Code Status: Full Code   Admission status: Patient will be admitted as Observation, with a less than 2 midnight length of stay.   Family Communication:  none at bedside  (The above findings and plan of care has been discussed with patient in detail, the patient expressed understanding and agreement of above plan)  --------------------------------------------------------------------------------------------------------------------------------------------------  Disposition Plan:  Anticipated 1-2 days Status is: Observation  The patient remains OBS appropriate and will d/c before 2 midnights.  Dispo: The patient is from: Home              Anticipated d/c is to: Home              Anticipated d/c date is: 1 day              Patient currently is not medically stable to d/c.         ----------------------------------------------------------------------------------------------------------------------------------------------------  Time spent: > than  45  Min.   SIGNED: Deatra James, MD, FACP, FHM. [facility],  Pager (Please use [email or web address] to page to text)  If 7PM-7AM, please contact night-coverage [email or web address] Ota [facility] 09/23/2019, 5:03 PM

## 2019-09-23 NOTE — ED Notes (Signed)
Pt given gingerale and [place] sandwich tray

## 2019-09-23 NOTE — ED Triage Notes (Signed)
Pt c/o cough with congestion, SOB with generalized weakness since last Wednesday. States he has not been tested for covid.

## 2019-09-24 DIAGNOSIS — R413 Other amnesia: Secondary | ICD-10-CM | POA: Diagnosis not present

## 2019-09-24 DIAGNOSIS — R55 Syncope and collapse: Secondary | ICD-10-CM | POA: Diagnosis not present

## 2019-09-24 DIAGNOSIS — I10 Essential (primary) hypertension: Secondary | ICD-10-CM | POA: Diagnosis not present

## 2019-09-24 DIAGNOSIS — E7800 Pure hypercholesterolemia, unspecified: Secondary | ICD-10-CM | POA: Diagnosis not present

## 2019-09-24 LAB — GLUCOSE, CAPILLARY: Glucose-Capillary: 93 mg/dL (ref 70–99)

## 2019-09-24 LAB — ECHOCARDIOGRAM COMPLETE
AR max vel: 2.87 cm2
AV Peak grad: 10.2 mmHg
Ao pk vel: 1.6 m/s
Area-P 1/2: 8.43 cm2
Height: 73 in
S' Lateral: 2.97 cm
Weight: 3712 oz

## 2019-09-24 LAB — BASIC METABOLIC PANEL
Anion gap: 12 (ref 5–15)
BUN: 8 mg/dL (ref 6–20)
CO2: 22 mmol/L (ref 22–32)
Calcium: 8.2 mg/dL — ABNORMAL LOW (ref 8.9–10.3)
Chloride: 102 mmol/L (ref 98–111)
Creatinine, Ser: 1.16 mg/dL (ref 0.61–1.24)
GFR calc Af Amer: >60 mL/min (ref >60)
GFR calc non Af Amer: >60 mL/min (ref >60)
Glucose, Bld: 108 mg/dL — ABNORMAL HIGH (ref 70–99)
Potassium: 3.6 mmol/L (ref 3.5–5.1)
Sodium: 136 mmol/L (ref 135–145)

## 2019-09-24 LAB — HIV ANTIBODY (ROUTINE TESTING W REFLEX): HIV Screen 4th Generation wRfx: NONREACTIVE

## 2019-09-24 LAB — HEMOGLOBIN A1C
Hgb A1c MFr Bld: 5.6 % (ref 4.8–5.6)
Mean Plasma Glucose: 114 mg/dL

## 2019-09-24 LAB — TROPONIN I (HIGH SENSITIVITY): Troponin I (High Sensitivity): 5 ng/L (ref <18)

## 2019-09-24 LAB — APTT: aPTT: 28 seconds (ref 24–36)

## 2019-09-24 LAB — PROTIME-INR
INR: 1 (ref 0.8–1.2)
Prothrombin Time: 12.8 seconds (ref 11.4–15.2)

## 2019-09-24 MED ORDER — AZITHROMYCIN 500 MG PO TABS: 500.0000 mg | ORAL_TABLET | Freq: Every day | ORAL | 0 refills | Status: AC

## 2019-09-24 MED ORDER — HYDROMORPHONE HCL 1 MG/ML IJ SOLN
1.0000 mg | Freq: Once | INTRAMUSCULAR | Status: AC
  Administered 2019-09-24: 1 mg via INTRAVENOUS
  Filled 2019-09-24: qty 1

## 2019-09-24 MED ORDER — GUAIFENESIN-DM 100-10 MG/5ML PO SYRP: 10.0000 mL | ORAL_SOLUTION | Freq: Three times a day (TID) | ORAL | 0 refills | Status: AC

## 2019-09-24 NOTE — ED Notes (Signed)
O2 sats noted at 89% room air. Pt placed on 2L [place], improved to 94%

## 2019-09-24 NOTE — Discharge Summary (Signed)
Physician Discharge Summary [facility]    Patient: [name]                   Admit date: 09/23/2019   DOB: 1960/06/03             Discharge date:09/24/2019/10:47 AM GMW:[id number]                          PCP: Einar Pheasant, MD  Disposition: HOME   Recommendations for Outpatient Follow-up:   . Follow up: in 1 week  Discharge Condition: Stable   Code Status:   Code Status: Full Code  Diet recommendation: Regular healthy diet   Discharge Diagnoses:    Principal Problem:   Syncope Active Problems:   Sinusitis   Hypercholesteremia   Short-term memory loss   Sinus tachycardia   Hypertension   History of Present Illness/ Hospital Course [name] Summary:   [name] is a 59 y.o. male with medical history significant history of anoxic brain injury, chronic back pain status post 5 back surgeries, coronary artery disease (2007 at [facility]-30% stenosis of proximal LAD, ejection fraction 65%) depression, history of pancreatitis, hypercholesterolemia, hypertension GERD, presented with episode of passing out spell..  Patient presented to the ED with chief complaint of cough congestion shortness of breath weakness since last Wednesday.  He was at the [facility] clinic today where he was tested negative for Covid, was leaning against the window where he passed out. Stating last thing he remembers was leaning towards the window then nurses and doctors were standing over him.   Patient Denies having: Fever, Chills.only complaining of cough, congestion, mild shortness of breath since Wednesday.  But denies of having  Chest Pain, Abd pain, N/V/D, headache, dizziness, lightheadedness,  Dysuria, Joint pain, rash, open wounds  ED Course:   Hemodynamically stable: T-max 99 currently 98.3, heart rate 98-1 06 respiratory rate 20-22 currently 20, blood pressure 134/91, satting 98% on room air CBC CMP within normal limits,  CTA-negative for acute PE,Mild dilatation of the  ascending aorta 3.9 cm Chest x-ray within normal limits. CT of the head negative, mild chronic sinusitis  SARS-CoV-2 negative again in ED by PCR   Syncope -Patient will be admitted to medical floor monitor bed for close observation -We will continue with neurochecks --- no events overnight Determined likely vasovagal due to sinusitis, cough, mild hypertension -Remains satting greater 92% on room air -CTA-negative for acute PE,Mild dilatation of the ascending aorta 3.9 cm -Chest x-ray within normal limits. -CT of the head negative, mild chronic sinusitis -We will continue to monitor ruling out dysrhythmia--no events overnight -We will pursue bilateral carotid studies --less than 50% stenosis bilateral - Orthostatic blood pressure checks-negative -2D echocardiogram-pulmonary report within normal limits, pending final read by cardiology. Cardiac enzymes negative  SARS-CoV-2 negative again in ED by PC  Acute on chronic sinusitis -Present, confirmed on CT angiogram -We will initiate empiric antibiotic azithromycin -T-max 99, WBC of 6.4, no signs of sirs or sepsis -Overt infection ruled out, continue antibiotics     Hypercholesteremia -We will continue statins-Crestor,    Short-term memory loss -We will monitor closely -No history of dementia currently not any medication    Sinus tachycardia -We will monitor on telemetry bed    Hypertension  -we will resume her home medication Including Norvasc,  coronary artery disease  (2007 at [facility]-30% stenosis of proximal LAD, ejection fraction 65%)  -No complaint of chest pain -We  will resume home medication    GERD  continue Protonix, Carafate,  Chronic neck/back pain -On chronic muscle relaxants, pain medication regimen including Neurontin, NSAIDs, -Patient was informed that this polypharmacy may also cause alteration, and mental status, syncopal Reports that he has been taking it for long time due to failed back  surgeries  -Discussed with patient in detail regarding polypharmacy following with PCP modifying medication immediately.    Cultures:  - none  Antimicrobial: -Azithromycin  Consults called:  None  Disposition-cleared to be discharged home   Discharge Instructions:   Discharge Instructions    Activity as tolerated - No restrictions   Complete by: As directed    Diet - low sodium heart healthy   Complete by: As directed    Discharge instructions   Complete by: As directed    Follow-up your PCP, recommending modifying your home medication, continue current antibiotics, mucolytic's for your sinusitis.   Increase activity slowly   Complete by: As directed        Medication List    STOP taking these medications   amLODipine 5 MG tablet Commonly known as: NORVASC     TAKE these medications   aspirin 81 MG chewable tablet Chew 81 mg by mouth daily as needed for moderate pain.   atomoxetine 100 MG capsule Commonly known as: STRATTERA Take 100 mg by mouth daily.   azithromycin 500 MG tablet Commonly known as: Zithromax Take 1 tablet (500 mg total) by mouth daily for 5 days. Take 1 tablet daily for 3 days.   fluticasone 50 MCG/ACT nasal spray Commonly known as: FLONASE SPRAY 2 SPRAYS INTO EACH NOSTRIL EVERY DAY   gabapentin 300 MG capsule Commonly known as: NEURONTIN Take 1 or 2 capsules by mouth TID   guaiFENesin-dextromethorphan 100-10 MG/5ML syrup Commonly known as: ROBITUSSIN DM Take 10 mLs by mouth every 8 (eight) hours for 5 days.   modafinil 200 MG tablet Commonly known as: PROVIGIL Take 400 mg by mouth in the morning.   oxyCODONE 5 MG immediate release tablet Commonly known as: Oxy IR/ROXICODONE Take 5-10 mg by mouth at bedtime as needed for moderate pain.   pantoprazole 40 MG tablet Commonly known as: PROTONIX Take 40 mg by mouth 2 (two) times daily.   rosuvastatin 5 MG tablet Commonly known as: CRESTOR TAKE 1 TABLET BY MOUTH EVERY DAY    sildenafil 20 MG tablet Commonly known as: REVATIO TAKE 4 - 5 TABLETS BY MOUTH DAILY AS NEEDED   sucralfate 1 g tablet Commonly known as: CARAFATE Take 1 g by mouth daily.   tamsulosin 0.4 MG Caps capsule Commonly known as: FLOMAX Take 0.4 mg by mouth daily.   tiZANidine 4 MG tablet Commonly known as: ZANAFLEX TAKE 1 - 2 TABLETS BY MOUTH THREE TIMES DAILY AS NEEDED       Allergies  Allergen Reactions  . Corticosteroids Other (See Comments)    Pancreatitis  . Penicillins Anaphylaxis    Has patient had a PCN reaction causing immediate rash, facial/tongue/throat swelling, SOB or lightheadedness with hypotension: no Has patient had a PCN reaction causing severe rash involving mucus membranes or skin necrosis: yes Has patient had a PCN reaction that required hospitalization: yes Has patient had a PCN reaction occurring within the last 10 years: no If all of the above answers are "NO", then may proceed with Cephalosporin use.   . Sulfa Antibiotics Other (See Comments)    Unknown reaction     Procedures /Studies:   DG Chest  2 View  Result Date: 09/23/2019 CLINICAL DATA:  Cough and congestion.  Shortness of breath. EXAM: CHEST - 2 VIEW COMPARISON:  10/10/2017 FINDINGS: Asymmetric elevation of right hemidiaphragm is unchanged. Heart size is normal. No pleural effusion or edema identified. No airspace consolidation identified. IMPRESSION: 1. No acute cardiopulmonary abnormalities. Electronically Signed   By: Kerby Moors M.D.   On: 09/23/2019 10:36   CT Head Wo Contrast  Result Date: 09/23/2019 CLINICAL DATA:  Syncope EXAM: CT HEAD WITHOUT CONTRAST TECHNIQUE: Contiguous axial images were obtained from the base of the skull through the vertex without intravenous contrast. COMPARISON:  10/10/2017 FINDINGS: Brain: No acute intracranial abnormality. Specifically, no hemorrhage, hydrocephalus, mass lesion, acute infarction, or significant intracranial injury. Vascular: No hyperdense  vessel or unexpected calcification. Skull: No acute calvarial abnormality. Sinuses/Orbits: Mucosal thickening throughout the paranasal sinuses. No air-fluid levels. Other: None IMPRESSION: No intracranial abnormality. Mild chronic sinusitis. Electronically Signed   By: Rolm Baptise M.D.   On: 09/23/2019 15:11   CT Angio Chest PE W and/or Wo Contrast  Result Date: 09/23/2019 CLINICAL DATA:  Cough and congestion with shortness of breath for several days EXAM: CT ANGIOGRAPHY CHEST WITH CONTRAST TECHNIQUE: Multidetector CT imaging of the chest was performed using the standard protocol during bolus administration of intravenous contrast. Multiplanar CT image reconstructions and MIPs were obtained to evaluate the vascular anatomy. CONTRAST:  161mL OMNIPAQUE IOHEXOL 350 MG/ML SOLN COMPARISON:  Chest x-ray from earlier in the same day. FINDINGS: Cardiovascular: Thoracic aorta demonstrates atherosclerotic calcifications with mild dilatation of the ascending aorta to 3.9 cm. No dissection is seen. Pulmonary artery shows a normal branching pattern. No filling defects to suggest pulmonary emboli are identified. No cardiac enlargement is noted. Mediastinum/Nodes: Thoracic inlet is within normal limits. No sizable hilar or mediastinal adenopathy is noted. The esophagus as visualized is within normal limits. Small sliding-type hiatal hernia is noted. Lungs/Pleura: Lungs are well aerated bilaterally. Mild dependent atelectatic changes are noted. No sizable effusion or focal infiltrate is seen. Upper Abdomen: Visualized upper abdomen shows changes of prior cholecystectomy. No other focal abnormality is seen. Musculoskeletal: No chest wall abnormality. No acute or significant osseous findings. Review of the MIP images confirms the above findings. IMPRESSION: No definitive pulmonary emboli identified. Mild dilatation of the ascending aorta 3.9 cm. No dissection is seen. No focal infiltrate is seen. Aortic Atherosclerosis  (ICD10-I70.0). Electronically Signed   By: Inez Catalina M.D.   On: 09/23/2019 15:12   US Carotid Bilateral  Result Date: 09/24/2019 CLINICAL DATA:  Syncope and collapse EXAM: BILATERAL CAROTID DUPLEX ULTRASOUND TECHNIQUE: Pearline Cables scale imaging, color Doppler and duplex ultrasound were performed of bilateral carotid and vertebral arteries in the neck. COMPARISON:  None. FINDINGS: Criteria: Quantification of carotid stenosis is based on velocity parameters that correlate the residual internal carotid diameter with NASCET-based stenosis levels, using the diameter of the distal internal carotid lumen as the denominator for stenosis measurement. The following velocity measurements were obtained: RIGHT ICA: 89/34 cm/sec CCA: 23/76 cm/sec SYSTOLIC ICA/CCA RATIO:  0.9 ECA: 118 cm/sec LEFT ICA: 106/25 cm/sec CCA: 283/15 cm/sec SYSTOLIC ICA/CCA RATIO:  1.0 ECA: 121 cm/sec RIGHT CAROTID ARTERY: Minor intimal thickening without significant plaque formation. No hemodynamically significant right ICA stenosis, velocity elevation, or turbulent flow. Degree of narrowing less than 50%. RIGHT VERTEBRAL ARTERY:  Normal antegrade flow LEFT CAROTID ARTERY: Similar minor intimal thickening without significant plaque formation. No hemodynamically significant left ICA stenosis, velocity elevation, or turbulent flow. LEFT VERTEBRAL ARTERY:  Normal antegrade flow  Mild bilateral adenopathy, largest lymph node on the right measures 3.3 x 1.2 x 1.7 cm. Largest lymph node on the left measures 1.7 x 0.7 x 1.4 cm. These remain nonspecific. These lymph nodes have thickened hypoechoic cortex but preserved fatty hila. IMPRESSION: Minor carotid intimal thickening without significant atherosclerosis. No hemodynamically significant ICA stenosis. Degree of narrowing less than 50% bilaterally by ultrasound criteria. Patent antegrade vertebral flow bilaterally Nonspecific mild bilateral adenopathy, favored to be reactive. Electronically Signed   By: Jerilynn Mages.   Shick M.D.   On: 09/24/2019 09:14     Subjective:   Patient was seen and examined 09/24/2019, 10:47 AM Patient stable today. No acute distress.  No issues overnight Stable for discharge.  Discharge Exam:    Vitals:   09/24/19 0400 09/24/19 0600 09/24/19 0730 09/24/19 0931  BP: 122/75 105/63  137/89  Pulse: 72 67 77 80  Resp: 14 11 13 19   Temp:    97.7 F (36.5 C)  TempSrc:      SpO2: 95% 98% 94% 96%  Weight:      Height:        General: Pt lying comfortably in bed & appears in no obvious distress. Cardiovascular: S1 & S2 heard, RRR, S1/S2 +. No murmurs, rubs, gallops or clicks. No JVD or pedal edema. Respiratory: Clear to auscultation without wheezing, rhonchi or crackles. No increased work of breathing. Abdominal:  Non-distended, non-tender & soft. No organomegaly or masses appreciated. Normal bowel sounds heard. CNS: Alert and oriented. No focal deficits. Extremities: no edema, no cyanosis    The results of significant diagnostics from this hospitalization (including imaging, microbiology, ancillary and laboratory) are listed below for reference.      Microbiology:   Recent Results (from the past 240 hour(s))  SARS Coronavirus 2 by RT PCR (hospital order, performed in [facility] hospital lab) Nasopharyngeal Nasopharyngeal Swab     Status: None   Collection Time: 09/23/19 12:15 PM   Specimen: Nasopharyngeal Swab  Result Value Ref Range Status   SARS Coronavirus 2 NEGATIVE NEGATIVE Final    Comment: Performed at [facility], [address] [place], [place] [address]     Labs:   CBC: Recent Labs  Lab 09/23/19 0940  WBC 6.4  NEUTROABS 4.2  HGB 14.6  HCT 42.2  MCV 89.8  PLT 151   Basic Metabolic Panel: Recent Labs  Lab 09/23/19 0940 09/23/19 1729 09/24/19 0339  NA 138  --  136  K 4.1  --  3.6  CL 103  --  102  CO2 23  --  22  GLUCOSE 113*  --  108*  BUN 9  --  8  CREATININE 0.98  --  1.16  CALCIUM 9.0 8.9 8.2*  MG  --  2.1  --     PHOS  --  3.2  --    Liver Function Tests: No results for input(s): AST, ALT, ALKPHOS, BILITOT, PROT, ALBUMIN in the last 168 hours. BNP (last 3 results) Recent Labs    09/23/19 1729  BNP 25.0   Cardiac Enzymes: No results for input(s): CKTOTAL, CKMB, CKMBINDEX, TROPONINI in the last 168 hours. CBG: Recent Labs  Lab 09/24/19 0841  GLUCAP 93   Hgb A1c No results for input(s): HGBA1C in the last 72 hours. Lipid Profile No results for input(s): CHOL, HDL, LDLCALC, TRIG, CHOLHDL, LDLDIRECT in the last 72 hours. Thyroid function studies Recent Labs    09/23/19 1729  TSH 0.729   Anemia work up No  results for input(s): VITAMINB12, FOLATE, FERRITIN, TIBC, IRON, RETICCTPCT in the last 72 hours. Urinalysis    Component Value Date/Time   COLORURINE COLORLESS (A) 03/27/2016 1032   APPEARANCEUR CLEAR (A) 03/27/2016 1032   APPEARANCEUR Clear 10/27/2011 1256   LABSPEC 1.003 (L) 03/27/2016 1032   LABSPEC 1.011 10/27/2011 1256   PHURINE 7.0 03/27/2016 1032   GLUCOSEU NEGATIVE 03/27/2016 1032   GLUCOSEU Negative 10/27/2011 1256   HGBUR NEGATIVE 03/27/2016 1032   BILIRUBINUR NEGATIVE 03/27/2016 1032   BILIRUBINUR Negative 10/27/2011 [place] 03/27/2016 1032   PROTEINUR NEGATIVE 03/27/2016 1032   NITRITE NEGATIVE 03/27/2016 1032   LEUKOCYTESUR NEGATIVE 03/27/2016 1032   LEUKOCYTESUR Negative 10/27/2011 1256         Time coordinating discharge: Over 45 minutes  SIGNED: Deatra James, MD, FACP, FHM. [facility],  Please use [email or web address] to Page If 7PM-7AM, please contact night-coverage [email or web address] 09/24/2019, 10:47 AM

## 2019-09-24 NOTE — Evaluation (Signed)
Physical Therapy Evaluation Patient Details Name: [name] MRN: [id number] DOB: 06/02/1960 Today's Date: 09/24/2019   History of Present Illness  59 y.o.malewith medical history significanthistory of anoxic brain injury, chronic back pain status post 5 back surgeries, coronary artery disease(2007at [facility]-30% stenosis of proximal LAD, ejection fraction 65%)depression, history of pancreatitis, hypercholesterolemia, hypertension GERD, presented with episode of passing out spell..  Clinical Impression  Pt did well with PT exam, showed good safety and confidence with mobility, prolonged ambulation, steps, etc.  He did not have any LOBs or overt safety issues and O2 sats remained in the mid 90s t/o the entire session on room air.  Pt ultimately feels close to his baseline and should be safe to return home w/o PT f/u.  Pt does have h/o chronic LBP with multiple surgeries and mild L sided weakness/pain but again is independent and close to baseline.     Follow Up Recommendations No PT follow up    Equipment Recommendations  None recommended by PT    Recommendations for Other Services       Precautions / Restrictions Precautions Precautions: Fall Restrictions Weight Bearing Restrictions: No      Mobility  Bed Mobility Overal bed mobility: Independent                Transfers Overall transfer level: Independent Equipment used: None             General transfer comment: easily gets to standing w/o hesitation or safety issue  Ambulation/Gait Ambulation/Gait assistance: Independent Gait Distance (Feet): 250 Feet Assistive device: None       General Gait Details: Pt able to ambulate with consistent and confident cadence, reports at/near baseline  Stairs Stairs: Yes Stairs assistance: Independent Stair Management: No rails Number of Stairs: 6 General stair comments: easily negotiates up/down steps w/o issue  Wheelchair Mobility    Modified Rankin  (Stroke Patients Only)       Balance Overall balance assessment: Modified Independent                                           Pertinent Vitals/Pain Pain Assessment:  (chronic back pain/L LE pain)    Home Living Family/patient expects to be discharged to:: Private residence Living Arrangements: Children     Home Access: Stairs to enter   Technical brewer of Steps: 2   Home Equipment: None      Prior Function Level of Independence: Independent               Hand Dominance        Extremity/Trunk Assessment   Upper Extremity Assessment Upper Extremity Assessment: Overall WFL for tasks assessed    Lower Extremity Assessment Lower Extremity Assessment: Overall WFL for tasks assessed       Communication   Communication: No difficulties  Cognition Arousal/Alertness: Awake/alert Behavior During Therapy: WFL for tasks assessed/performed Overall Cognitive Status: Within Functional Limits for tasks assessed                                        General Comments General comments (skin integrity, edema, etc.): Pt on O2 on arrival, removed O2 and sats remained mid 90s during in-bed/room activity, maintained mid 90s t/o prolonged ambulation with no SOB or overtly labored breathing  Exercises     Assessment/Plan    PT Assessment Patent does not need any further PT services  PT Problem List         PT Treatment Interventions      PT Goals (Current goals can be found in the Care Plan section)  Acute Rehab PT Goals Patient Stated Goal: go home today PT Goal Formulation: All assessment and education complete, DC therapy    Frequency     Barriers to discharge        Co-evaluation               AM-PAC PT "6 Clicks" Mobility  Outcome Measure Help needed turning from your back to your side while in a flat bed without using bedrails?: None Help needed moving from lying on your back to sitting on the side  of a flat bed without using bedrails?: None Help needed moving to and from a bed to a chair (including a wheelchair)?: None Help needed standing up from a chair using your arms (e.g., wheelchair or bedside chair)?: None Help needed to walk in hospital room?: None Help needed climbing 3-5 steps with a railing? : None 6 Click Score: 24    End of Session   Activity Tolerance: Patient tolerated treatment well Patient left: in bed;with call bell/phone within reach Nurse Communication: Mobility status PT Visit Diagnosis: Muscle weakness (generalized) (M62.81)    Time: 6979-4801 PT Time Calculation (min) (ACUTE ONLY): 18 min   Charges:   PT Evaluation $PT Eval Low Complexity: 1 Low          Kreg Shropshire, DPT 09/24/2019, 11:20 AM

## 2019-09-30 ENCOUNTER — Telehealth: Payer: Medicare HMO | Admitting: Internal Medicine

## 2019-10-01 ENCOUNTER — Telehealth (INDEPENDENT_AMBULATORY_CARE_PROVIDER_SITE_OTHER): Payer: Medicare HMO | Admitting: Internal Medicine

## 2019-10-01 DIAGNOSIS — R55 Syncope and collapse: Secondary | ICD-10-CM

## 2019-10-01 DIAGNOSIS — I77819 Aortic ectasia, unspecified site: Secondary | ICD-10-CM | POA: Diagnosis not present

## 2019-10-01 DIAGNOSIS — J019 Acute sinusitis, unspecified: Secondary | ICD-10-CM | POA: Diagnosis not present

## 2019-10-01 DIAGNOSIS — I10 Essential (primary) hypertension: Secondary | ICD-10-CM | POA: Diagnosis not present

## 2019-10-01 MED ORDER — DOXYCYCLINE HYCLATE 100 MG PO TABS: 100.0000 mg | ORAL_TABLET | Freq: Two times a day (BID) | ORAL | 0 refills | Status: DC

## 2019-10-01 NOTE — Progress Notes (Signed)
Patient ID: [name], male   DOB: [birth year], 59 y.o.   MRN: [id number]   Virtual Visit via video Note  This visit type was conducted due to national recommendations for restrictions regarding the COVID-19 pandemic (e.g. social distancing).  This format is felt to be most appropriate for this patient at this time.  All issues noted in this document were discussed and addressed.  No physical exam was performed (except for noted visual exam findings with Video Visits).   I connected with [name] by a video enabled telemedicine application and verified that I am speaking with the correct person using two identifiers. Location patient: home Location provider: work Persons participating in the virtual visit: patient, provider  The limitations, risks, security and privacy concerns of performing an evaluation and management service by video and the availability of in person appointments have been discussed.  It has also been discussed with the patient that there may be a patient responsible charge related to this service. The patient expressed understanding and agreed to proceed.   Reason for visit: hospital follow up.   HPI: Presented to ER 09/23/19 with cough, congestion, weakness and sob.  Initially evaluated at [facility].  covid test negative.  Was leaning against the window (to check out) and passed out.  No chest pain.  No headache or dizziness. Transferred to ER.  CTA negative for acute PE.  Mild dilatation of ascending aorta 3.9cm.  cxr wnl.  CT head - negative - mild chronic sinusitis. PCR covid - negative.  Carotid ultrasound:  <50% stenosis bilateral.  Orthostatic blood pressure - negative.  Cardiac enzymes negative.  Sinusitis - treated with azithromycin.  Syncopal episode felt to be vasovagal.  Discharged to complete azithromycin.  ECHO - EF 60-65%, mild LVH, right ventricular systolic function is normal.  Aortic valve - not well visualized.  He reports he does feel better.   Still with some weakness and congestion.  Worse in pm - describes a "clogged up nose" which affects his breathing at night.  Some increased nasal congestion and mucus production from his nose.  No increased drainage now.  No vomiting or diarrhea.  Intermittent sore throat.  Taking robitussin DM.  Completed azithromycin.    ROS: See pertinent positives and negatives per HPI.  Past Medical History:  Diagnosis Date  . Allergy   . Anoxic brain injury ([place])   . Chronic back pain    s/p 5 back surgeries  . Coronary artery disease    Cardiac catheterization in 2007 at [facility] showed 30% stenosis in proximal LAD. No other disease. Ejection fraction was 65%  . Depression   . H/O acute pancreatitis   . Hypercholesterolemia   . Mild cognitive impairment with memory loss   . Pancreatitis     Past Surgical History:  Procedure Laterality Date  . abdomial surgery    . BACK SURGERY     multiple back surgeries (5)  . CARDIAC CATHETERIZATION     [facility]   . CHOLECYSTECTOMY  2013  . COLONOSCOPY WITH PROPOFOL N/A 06/30/2015   Procedure: COLONOSCOPY WITH PROPOFOL;  Surgeon: Lollie Sails, MD;  Location: [facility] ENDOSCOPY;  Service: Endoscopy;  Laterality: N/A;  . ESOPHAGOGASTRODUODENOSCOPY (EGD) WITH PROPOFOL N/A 03/30/2016   Procedure: ESOPHAGOGASTRODUODENOSCOPY (EGD) WITH PROPOFOL;  Surgeon: Manya Silvas, MD;  Location: [facility] END[facility]COPY;  Service: Endoscopy;  Laterality: N/A;  . FUNCTIONAL ENDOSCOPIC SINUS SURGERY    . JOINT REPLACEMENT    . LAMINECTOMY    .  LUMBAR SPINE SURGERY    . SHOULDER ARTHROSCOPY WITH ROTATOR CUFF REPAIR AND OPEN BICEPS TENODESIS Right   . SHOULDER ARTHROSCOPY WITH ROTATOR CUFF REPAIR AND OPEN BICEPS TENODESIS    . SPINE SURGERY    . TRIGGER POINT INJECTION    . VASECTOMY     With Reversal  . VEIN SURGERY Right 2013   arm    Family History  Problem Relation Age of Onset  . Heart disease Father   . Heart attack Father   . Heart disease Mother   . Heart attack Mother    . Heart attack Brother        MI's x 2   . Hypertension Other   . Colon cancer Other   . Hypertension Sister   . Heart attack Paternal Grandfather   . Hypertension Sister     SOCIAL HX: reviewed.    Current Outpatient Medications:  .  amLODipine (NORVASC) 5 MG tablet, Take 5 mg by mouth 2 (two) times daily., Disp: , Rfl:  .  aspirin 81 MG chewable tablet, Chew 81 mg by mouth daily as needed for moderate pain. , Disp: , Rfl:  .  atomoxetine (STRATTERA) 100 MG capsule, Take 100 mg by mouth daily., Disp: , Rfl:  .  doxycycline (VIBRA-TABS) 100 MG tablet, Take 1 tablet (100 mg total) by mouth 2 (two) times daily., Disp: 20 tablet, Rfl: 0 .  fluticasone (FLONASE) 50 MCG/ACT nasal spray, SPRAY 2 SPRAYS INTO EACH NOSTRIL EVERY DAY, Disp: 48 mL, Rfl: 1 .  gabapentin (NEURONTIN) 300 MG capsule, Take 1 or 2 capsules by mouth TID, Disp: , Rfl:  .  modafinil (PROVIGIL) 200 MG tablet, Take 400 mg by mouth in the morning., Disp: , Rfl:  .  oxyCODONE (OXY IR/ROXICODONE) 5 MG immediate release tablet, Take 5-10 mg by mouth at bedtime as needed for moderate pain. , Disp: , Rfl: 0 .  pantoprazole (PROTONIX) 40 MG tablet, Take 40 mg by mouth 2 (two) times daily., Disp: , Rfl:  .  rosuvastatin (CRESTOR) 5 MG tablet, TAKE 1 TABLET BY MOUTH EVERY DAY, Disp: 90 tablet, Rfl: 1 .  sildenafil (REVATIO) 20 MG tablet, TAKE 4 - 5 TABLETS BY MOUTH DAILY AS NEEDED, Disp: , Rfl: 3 .  sucralfate (CARAFATE) 1 g tablet, Take 1 g by mouth daily. , Disp: , Rfl: 1 .  tamsulosin (FLOMAX) 0.4 MG CAPS capsule, Take 0.4 mg by mouth daily., Disp: , Rfl:  .  tiZANidine (ZANAFLEX) 4 MG tablet, TAKE 1 - 2 TABLETS BY MOUTH THREE TIMES DAILY AS NEEDED, Disp: , Rfl:   EXAM:  GENERAL: alert, oriented, appears well and in no acute distress  HEENT: atraumatic, conjunttiva clear, no obvious abnormalities on inspection of external nose and ears  NECK: normal movements of the head and neck  LUNGS: on inspection no signs of  respiratory distress, breathing rate appears normal, no obvious gross SOB, gasping or wheezing  CV: no obvious cyanosis  PSYCH/NEURO: pleasant and cooperative, no obvious depression or anxiety, speech and thought processing grossly intact  ASSESSMENT AND PLAN:  Discussed the following assessment and plan:  Syncope Had the syncopal episode as outlined.  W/up in hospital as outlined.  Per note, felt to be vasovagal.  Is feeling better.  Treat infection as outlined.  Stay hydrated.  Follow.  Had a previous syncopal episode after starting elavil.  See 06/2019 note for details.  Referred to cardiology for further w/up and evaluation.  Discussed the need for  f/u with cardiology.  Stay hydrated.  Follow.    Sinusitis Recent congestion and symptoms as outlined.  Admitted. CT - sinusitis.  Treated with azithromycin.  Better, but still with symptoms.  Continue robitussin as directed.  No chest congestion or sob.  Treat with doxycyline.  Follow closely.  Call with update.   Hypertension Have him continue to follow pressures.  Continue amlodipine.   Aortic dilatation (HCC) Mild dilation mentioned on recent echo.  Refer back to cardiology for evaluation.     Meds ordered this encounter  Medications  . doxycycline (VIBRA-TABS) 100 MG tablet    Sig: Take 1 tablet (100 mg total) by mouth 2 (two) times daily.    Dispense:  20 tablet    Refill:  0     I discussed the assessment and treatment plan with the patient. The patient was provided an opportunity to ask questions and all were answered. The patient agreed with the plan and demonstrated an understanding of the instructions.   The patient was advised to call back or seek an in-person evaluation if the symptoms worsen or if the condition fails to improve as anticipated.   Einar Pheasant, MD

## 2019-10-03 ENCOUNTER — Encounter: Payer: Self-pay | Admitting: Internal Medicine

## 2019-10-07 ENCOUNTER — Encounter: Payer: Self-pay | Admitting: Internal Medicine

## 2019-10-07 DIAGNOSIS — I77819 Aortic ectasia, unspecified site: Secondary | ICD-10-CM | POA: Insufficient documentation

## 2019-10-07 NOTE — Assessment & Plan Note (Signed)
Have him continue to follow pressures.  Continue amlodipine.

## 2019-10-07 NOTE — Assessment & Plan Note (Signed)
Mild dilation mentioned on recent echo.  Refer back to cardiology for evaluation.

## 2019-10-07 NOTE — Assessment & Plan Note (Signed)
Had the syncopal episode as outlined.  W/up in hospital as outlined.  Per note, felt to be vasovagal.  Is feeling better.  Treat infection as outlined.  Stay hydrated.  Follow.  Had a previous syncopal episode after starting elavil.  See 06/2019 note for details.  Referred to cardiology for further w/up and evaluation.  Discussed the need for f/u with cardiology.  Stay hydrated.  Follow.

## 2019-10-07 NOTE — Assessment & Plan Note (Signed)
Recent congestion and symptoms as outlined.  Admitted. CT - sinusitis.  Treated with azithromycin.  Better, but still with symptoms.  Continue robitussin as directed.  No chest congestion or sob.  Treat with doxycyline.  Follow closely.  Call with update.

## 2019-11-19 ENCOUNTER — Other Ambulatory Visit: Payer: Self-pay | Admitting: Internal Medicine

## 2020-01-06 ENCOUNTER — Ambulatory Visit (INDEPENDENT_AMBULATORY_CARE_PROVIDER_SITE_OTHER): Payer: Medicare HMO | Admitting: Internal Medicine

## 2020-01-06 ENCOUNTER — Encounter: Payer: Self-pay | Admitting: Internal Medicine

## 2020-01-06 ENCOUNTER — Ambulatory Visit
Admission: RE | Admit: 2020-01-06 | Discharge: 2020-01-06 | Disposition: A | Discharge status: Home / Self Care | Payer: Medicare HMO | Source: Ambulatory Visit | Attending: Internal Medicine | Admitting: Internal Medicine

## 2020-01-06 ENCOUNTER — Other Ambulatory Visit: Payer: Self-pay

## 2020-01-06 VITALS — BP 124/82 | HR 95 | Temp 98.3°F | Ht 72.99 in

## 2020-01-06 DIAGNOSIS — Z8719 Personal history of other diseases of the digestive system: Secondary | ICD-10-CM

## 2020-01-06 DIAGNOSIS — E7800 Pure hypercholesterolemia, unspecified: Secondary | ICD-10-CM

## 2020-01-06 DIAGNOSIS — R7989 Other specified abnormal findings of blood chemistry: Secondary | ICD-10-CM

## 2020-01-06 DIAGNOSIS — M25562 Pain in left knee: Secondary | ICD-10-CM

## 2020-01-06 DIAGNOSIS — M545 Low back pain, unspecified: Secondary | ICD-10-CM

## 2020-01-06 DIAGNOSIS — R945 Abnormal results of liver function studies: Secondary | ICD-10-CM

## 2020-01-06 DIAGNOSIS — M7989 Other specified soft tissue disorders: Secondary | ICD-10-CM | POA: Diagnosis not present

## 2020-01-06 DIAGNOSIS — Z0000 Encounter for general adult medical examination without abnormal findings: Secondary | ICD-10-CM | POA: Diagnosis not present

## 2020-01-06 DIAGNOSIS — I10 Essential (primary) hypertension: Secondary | ICD-10-CM

## 2020-01-06 DIAGNOSIS — D649 Anemia, unspecified: Secondary | ICD-10-CM

## 2020-01-06 DIAGNOSIS — I77819 Aortic ectasia, unspecified site: Secondary | ICD-10-CM

## 2020-01-06 DIAGNOSIS — G8929 Other chronic pain: Secondary | ICD-10-CM

## 2020-01-06 LAB — BASIC METABOLIC PANEL
BUN: 12 mg/dL (ref 6–23)
CO2: 27 mEq/L (ref 19–32)
Calcium: 9.6 mg/dL (ref 8.4–10.5)
Chloride: 102 mEq/L (ref 96–112)
Creatinine, Ser: 0.99 mg/dL (ref 0.40–1.50)
GFR: 84.64 mL/min (ref >60.00)
Glucose, Bld: 91 mg/dL (ref 70–99)
Potassium: 4.6 mEq/L (ref 3.5–5.1)
Sodium: 137 mEq/L (ref 135–145)

## 2020-01-06 LAB — HEPATIC FUNCTION PANEL
ALT: 21 U/L (ref 0–53)
AST: 20 U/L (ref 0–37)
Albumin: 4.5 g/dL (ref 3.5–5.2)
Alkaline Phosphatase: 163 U/L — ABNORMAL HIGH (ref 39–117)
Bilirubin, Direct: 0.1 mg/dL (ref 0.0–0.3)
Total Bilirubin: 0.5 mg/dL (ref 0.2–1.2)
Total Protein: 7.8 g/dL (ref 6.0–8.3)

## 2020-01-06 LAB — LIPID PANEL
Cholesterol: 114 mg/dL (ref 0–200)
HDL: 55.8 mg/dL (ref >39.00)
LDL Cholesterol: 44 mg/dL (ref 0–99)
NonHDL: 58.11
Total CHOL/HDL Ratio: 2
Triglycerides: 69 mg/dL (ref 0.0–149.0)
VLDL: 13.8 mg/dL (ref 0.0–40.0)

## 2020-01-06 LAB — AMYLASE: Amylase: 36 U/L (ref 27–131)

## 2020-01-06 LAB — LIPASE: Lipase: 30 U/L (ref 11.0–59.0)

## 2020-01-06 NOTE — Progress Notes (Signed)
Patient ID: AYUSH BOULET, male   DOB: [birth year], 59 y.o.   MRN: [id number]   Subjective:    Patient ID: [name], male    DOB: 03-28-1960, 58 y.o.   MRN: [id number]  HPI This visit occurred during the SARS-CoV-2 public health emergency.  Safety protocols were in place, including screening questions prior to the visit, additional usage of staff PPE, and extensive cleaning of exam room while observing appropriate contact time as indicated for disinfecting solutions.  Patient here for his physical exam.  Has been undergoing w/up for elevated alkaline phos. Seeing GI - [facility].  Has had multiple episodes of pancreatitis.  No evidence of overt liver disease.   Recommended MRCP.  Eating.  No nausea or vomiting.  No abdominal pain.  No chest pain or sob reported.  Some increased fatigue.  Noticed recent popping - left knee.  Occurred one week ago.  Has been icing.  Tight/pain - top of knee.  Limiting activity.  Handling stress.   Past Medical History:  Diagnosis Date  . Allergy   . Anoxic brain injury ([place])   . Chronic back pain    s/p 5 back surgeries  . Coronary artery disease    Cardiac catheterization in 2007 at [facility] showed 30% stenosis in proximal LAD. No other disease. Ejection fraction was 65%  . Depression   . H/O acute pancreatitis   . Hypercholesterolemia   . Mild cognitive impairment with memory loss   . Pancreatitis    Past Surgical History:  Procedure Laterality Date  . abdomial surgery    . BACK SURGERY     multiple back surgeries (5)  . CARDIAC CATHETERIZATION     [facility]   . CHOLECYSTECTOMY  2013  . COLONOSCOPY WITH PROPOFOL N/A 06/30/2015   Procedure: COLONOSCOPY WITH PROPOFOL;  Surgeon: Lollie Sails, MD;  Location: [facility] ENDOSCOPY;  Service: Endoscopy;  Laterality: N/A;  . ESOPHAGOGASTRODUODENOSCOPY (EGD) WITH PROPOFOL N/A 03/30/2016   Procedure: ESOPHAGOGASTRODUODENOSCOPY (EGD) WITH PROPOFOL;  Surgeon: Manya Silvas, MD;  Location: [facility] ENDOSCOPY;  Service:  Endoscopy;  Laterality: N/A;  . FUNCTIONAL ENDOSCOPIC SINUS SURGERY    . JOINT REPLACEMENT    . LAMINECTOMY    . LUMBAR SPINE SURGERY    . SHOULDER ARTHROSCOPY WITH ROTATOR CUFF REPAIR AND OPEN BICEPS TENODESIS Right   . SHOULDER ARTHROSCOPY WITH ROTATOR CUFF REPAIR AND OPEN BICEPS TENODESIS    . SPINE SURGERY    . TRIGGER POINT INJECTION    . VASECTOMY     With Reversal  . VEIN SURGERY Right 2013   arm   Family History  Problem Relation Age of Onset  . Heart disease Father   . Heart attack Father   . Heart disease Mother   . Heart attack Mother   . Heart attack Brother        MI's x 2   . Hypertension Other   . Colon cancer Other   . Hypertension Sister   . Heart attack Paternal Grandfather   . Hypertension Sister    Social History   Socioeconomic History  . Marital status: Divorced    Spouse name: Not on file  . Number of children: 3  . Years of education: Not on file  . Highest education level: Not on file  Occupational History  . Not on file  Tobacco Use  . Smoking status: Never Smoker  . Smokeless tobacco: Never Used  Vaping Use  . Vaping Use: Never used  Substance  and Sexual Activity  . Alcohol use: No    Alcohol/week: 0.0 standard drinks  . Drug use: No  . Sexual activity: Yes  Other Topics Concern  . Not on file  Social History Narrative  . Not on file   Social Determinants of Health   Financial Resource Strain: Not on file  Food Insecurity: Not on file  Transportation Needs: Not on file  Physical Activity: Not on file  Stress: Not on file  Social Connections: Not on file    Outpatient Encounter Medications as of 01/06/2020  Medication Sig  . amLODipine (NORVASC) 5 MG tablet Take 5 mg by mouth 2 (two) times daily.  Marland Kitchen aspirin 81 MG chewable tablet Chew 81 mg by mouth daily as needed for moderate pain.   Marland Kitchen atomoxetine (STRATTERA) 100 MG capsule Take 100 mg by mouth daily.  Marland Kitchen doxycycline (VIBRA-TABS) 100 MG tablet Take 1 tablet (100 mg total) by  mouth 2 (two) times daily.  . fluticasone (FLONASE) 50 MCG/ACT nasal spray SPRAY 2 SPRAYS INTO EACH NOSTRIL EVERY DAY  . gabapentin (NEURONTIN) 300 MG capsule Take 1 or 2 capsules by mouth TID  . modafinil (PROVIGIL) 200 MG tablet Take 400 mg by mouth in the morning.  Marland Kitchen oxyCODONE (OXY IR/ROXICODONE) 5 MG immediate release tablet Take 5-10 mg by mouth at bedtime as needed for moderate pain.   . pantoprazole (PROTONIX) 40 MG tablet Take 40 mg by mouth 2 (two) times daily.  . rosuvastatin (CRESTOR) 5 MG tablet TAKE 1 TABLET BY MOUTH EVERY DAY  . sildenafil (REVATIO) 20 MG tablet TAKE 4 - 5 TABLETS BY MOUTH DAILY AS NEEDED  . sucralfate (CARAFATE) 1 g tablet Take 1 g by mouth daily.   . tamsulosin (FLOMAX) 0.4 MG CAPS capsule Take 0.4 mg by mouth daily.  Marland Kitchen tiZANidine (ZANAFLEX) 4 MG tablet TAKE 1 - 2 TABLETS BY MOUTH THREE TIMES DAILY AS NEEDED   No facility-administered encounter medications on file as of 01/06/2020.    Review of Systems  Constitutional: Negative for appetite change and unexpected weight change.  HENT: Negative for congestion, sinus pressure and sore throat.   Eyes: Negative for pain and visual disturbance.  Respiratory: Negative for cough, chest tightness and shortness of breath.   Cardiovascular: Negative for chest pain, palpitations and leg swelling.  Gastrointestinal: Negative for abdominal pain, diarrhea, nausea and vomiting.  Genitourinary: Negative for difficulty urinating and dysuria.  Musculoskeletal: Negative for myalgias.       Knee pain and swelling as outlined.    Skin: Negative for color change and rash.  Neurological: Negative for dizziness, light-headedness and headaches.  Hematological: Negative for adenopathy. Does not bruise/bleed easily.  Psychiatric/Behavioral: Negative for agitation and dysphoric mood.       Objective:    Physical Exam Vitals reviewed.  Constitutional:      General: He is not in acute distress.    Appearance: Normal appearance.  He is well-developed and well-nourished.  HENT:     Head: Normocephalic and atraumatic.     Right Ear: External ear normal.     Left Ear: External ear normal.     Mouth/Throat:     Mouth: Oropharynx is clear and moist.  Eyes:     General: No scleral icterus.       Right eye: No discharge.        Left eye: No discharge.     Conjunctiva/sclera: Conjunctivae normal.  Neck:     Thyroid: No thyromegaly.  Cardiovascular:  Rate and Rhythm: Normal rate and regular rhythm.  Pulmonary:     Effort: No respiratory distress.     Breath sounds: Normal breath sounds. No wheezing.  Abdominal:     General: Bowel sounds are normal.     Palpations: Abdomen is soft.     Tenderness: There is no abdominal tenderness.  Musculoskeletal:        General: No swelling, tenderness or edema.     Cervical back: Neck supple. No tenderness.  Lymphadenopathy:     Cervical: No cervical adenopathy.  Skin:    Findings: No erythema or rash.  Neurological:     Mental Status: He is alert and oriented to person, place, and time.  Psychiatric:        Mood and Affect: Mood and affect and mood normal.        Behavior: Behavior normal.     BP 124/82 (BP Location: Left Arm, Patient Position: Sitting)   Pulse 95   Temp 98.3 F (36.8 C)   Ht 6' 0.99" (1.854 m)   SpO2 98%   BMI 29.69 kg/m  Wt Readings from Last 3 Encounters:  10/01/19 225 lb (102.1 kg)  09/23/19 232 lb (105.2 kg)  08/29/19 (!) 224 lb (101.6 kg)     Lab Results  Component Value Date   WBC 6.4 09/23/2019   HGB 14.6 09/23/2019   HCT 42.2 09/23/2019   PLT 307 09/23/2019   GLUCOSE 91 01/06/2020   CHOL 114 01/06/2020   TRIG 69.0 01/06/2020   HDL 55.80 01/06/2020   LDLCALC 44 01/06/2020   ALT 21 01/06/2020   AST 20 01/06/2020   NA 137 01/06/2020   K 4.6 01/06/2020   CL 102 01/06/2020   CREATININE 0.99 01/06/2020   BUN 12 01/06/2020   CO2 27 01/06/2020   TSH 0.729 09/23/2019   PSA 0.70 08/29/2019   INR 1.0 09/24/2019   HGBA1C  5.6 09/23/2019    DG Chest 2 View  Result Date: 09/23/2019 CLINICAL DATA:  Cough and congestion.  Shortness of breath. EXAM: CHEST - 2 VIEW COMPARISON:  10/10/2017 FINDINGS: Asymmetric elevation of right hemidiaphragm is unchanged. Heart size is normal. No pleural effusion or edema identified. No airspace consolidation identified. IMPRESSION: 1. No acute cardiopulmonary abnormalities. Electronically Signed   By: Kerby Moors M.D.   On: 09/23/2019 10:36   CT Head Wo Contrast  Result Date: 09/23/2019 CLINICAL DATA:  Syncope EXAM: CT HEAD WITHOUT CONTRAST TECHNIQUE: Contiguous axial images were obtained from the base of the skull through the vertex without intravenous contrast. COMPARISON:  10/10/2017 FINDINGS: Brain: No acute intracranial abnormality. Specifically, no hemorrhage, hydrocephalus, mass lesion, acute infarction, or significant intracranial injury. Vascular: No hyperdense vessel or unexpected calcification. Skull: No acute calvarial abnormality. Sinuses/Orbits: Mucosal thickening throughout the paranasal sinuses. No air-fluid levels. Other: None IMPRESSION: No intracranial abnormality. Mild chronic sinusitis. Electronically Signed   By: Rolm Baptise M.D.   On: 09/23/2019 15:11   CT Angio Chest PE W and/or Wo Contrast  Result Date: 09/23/2019 CLINICAL DATA:  Cough and congestion with shortness of breath for several days EXAM: CT ANGIOGRAPHY CHEST WITH CONTRAST TECHNIQUE: Multidetector CT imaging of the chest was performed using the standard protocol during bolus administration of intravenous contrast. Multiplanar CT image reconstructions and MIPs were obtained to evaluate the vascular anatomy. CONTRAST:  167mL OMNIPAQUE IOHEXOL 350 MG/ML SOLN COMPARISON:  Chest x-ray from earlier in the same day. FINDINGS: Cardiovascular: Thoracic aorta demonstrates atherosclerotic calcifications with mild dilatation of the  ascending aorta to 3.9 cm. No dissection is seen. Pulmonary artery shows a normal  branching pattern. No filling defects to suggest pulmonary emboli are identified. No cardiac enlargement is noted. Mediastinum/Nodes: Thoracic inlet is within normal limits. No sizable hilar or mediastinal adenopathy is noted. The esophagus as visualized is within normal limits. Small sliding-type hiatal hernia is noted. Lungs/Pleura: Lungs are well aerated bilaterally. Mild dependent atelectatic changes are noted. No sizable effusion or focal infiltrate is seen. Upper Abdomen: Visualized upper abdomen shows changes of prior cholecystectomy. No other focal abnormality is seen. Musculoskeletal: No chest wall abnormality. No acute or significant osseous findings. Review of the MIP images confirms the above findings. IMPRESSION: No definitive pulmonary emboli identified. Mild dilatation of the ascending aorta 3.9 cm. No dissection is seen. No focal infiltrate is seen. Aortic Atherosclerosis (ICD10-I70.0). Electronically Signed   By: Inez Catalina M.D.   On: 09/23/2019 15:12   US Carotid Bilateral  Result Date: 09/24/2019 CLINICAL DATA:  Syncope and collapse EXAM: BILATERAL CAROTID DUPLEX ULTRASOUND TECHNIQUE: Pearline Cables scale imaging, color Doppler and duplex ultrasound were performed of bilateral carotid and vertebral arteries in the neck. COMPARISON:  None. FINDINGS: Criteria: Quantification of carotid stenosis is based on velocity parameters that correlate the residual internal carotid diameter with NASCET-based stenosis levels, using the diameter of the distal internal carotid lumen as the denominator for stenosis measurement. The following velocity measurements were obtained: RIGHT ICA: 89/34 cm/sec CCA: 96/22 cm/sec SYSTOLIC ICA/CCA RATIO:  0.9 ECA: 118 cm/sec LEFT ICA: 106/25 cm/sec CCA: 297/98 cm/sec SYSTOLIC ICA/CCA RATIO:  1.0 ECA: 121 cm/sec RIGHT CAROTID ARTERY: Minor intimal thickening without significant plaque formation. No hemodynamically significant right ICA stenosis, velocity elevation, or turbulent  flow. Degree of narrowing less than 50%. RIGHT VERTEBRAL ARTERY:  Normal antegrade flow LEFT CAROTID ARTERY: Similar minor intimal thickening without significant plaque formation. No hemodynamically significant left ICA stenosis, velocity elevation, or turbulent flow. LEFT VERTEBRAL ARTERY:  Normal antegrade flow Mild bilateral adenopathy, largest lymph node on the right measures 3.3 x 1.2 x 1.7 cm. Largest lymph node on the left measures 1.7 x 0.7 x 1.4 cm. These remain nonspecific. These lymph nodes have thickened hypoechoic cortex but preserved fatty hila. IMPRESSION: Minor carotid intimal thickening without significant atherosclerosis. No hemodynamically significant ICA stenosis. Degree of narrowing less than 50% bilaterally by ultrasound criteria. Patent antegrade vertebral flow bilaterally Nonspecific mild bilateral adenopathy, favored to be reactive. Electronically Signed   By: Jerilynn Mages.  Shick M.D.   On: 09/24/2019 09:14   ECHOCARDIOGRAM COMPLETE  Result Date: 09/24/2019    ECHOCARDIOGRAM REPORT   Patient Name:   BERNON ARVISO Date of Exam: 09/23/2019 Medical Rec #:  [id number]        Height:       73.0 in Accession #:    [id number]       Weight:       232.0 lb Date of Birth:  16-Apr-1960        BSA:          2.292 m Patient Age:    30 years         BP:           134/91 mmHg Patient Gender: M                HR:           103 bpm. Exam Location:  [facility] Procedure: 2D Echo, Cardiac Doppler and Color Doppler Indications:     Syncope 780.2 /  R55  History:         Patient has no prior history of Echocardiogram examinations.                  CAD.  Sonographer:     Alyse Low Roar Referring Phys:  [id number] Deatra James Diagnosing Phys: Nelva Bush MD  Sonographer Comments: Technically difficult study due to poor echo windows. IMPRESSIONS  1. Left ventricular ejection fraction, by estimation, is 60 to 65%. The left ventricle has normal function. Left ventricular endocardial border not optimally defined to evaluate  regional wall motion. There is mild left ventricular hypertrophy. Indeterminate diastolic filling due to E-A fusion.  2. Right ventricular systolic function is normal. The right ventricular size is normal. Tricuspid regurgitation signal is inadequate for assessing PA pressure.  3. The mitral valve is grossly normal. No evidence of mitral valve regurgitation. No evidence of mitral stenosis.  4. The aortic valve was not well visualized. Aortic valve regurgitation is not visualized. No aortic stenosis is present. FINDINGS  Left Ventricle: Left ventricular ejection fraction, by estimation, is 60 to 65%. The left ventricle has normal function. Left ventricular endocardial border not optimally defined to evaluate regional wall motion. The left ventricular internal cavity size was normal in size. There is mild left ventricular hypertrophy. Indeterminate diastolic filling due to E-A fusion. Right Ventricle: The right ventricular size is normal. No increase in right ventricular wall thickness. Right ventricular systolic function is normal. Tricuspid regurgitation signal is inadequate for assessing PA pressure. Left Atrium: Left atrial size was normal in size. Right Atrium: Right atrial size was normal in size. Pericardium: The pericardium was not well visualized. Mitral Valve: The mitral valve is grossly normal. No evidence of mitral valve regurgitation. No evidence of mitral valve stenosis. Tricuspid Valve: The tricuspid valve is not well visualized. Tricuspid valve regurgitation is not demonstrated. Aortic Valve: The aortic valve was not well visualized. Aortic valve regurgitation is not visualized. No aortic stenosis is present. Aortic valve peak gradient measures 10.2 mmHg. Pulmonic Valve: The pulmonic valve was not well visualized. Pulmonic valve regurgitation is not visualized. No evidence of pulmonic stenosis. Aorta: The aortic root is normal in size and structure. Pulmonary Artery: The pulmonary artery is not well  seen. Venous: The inferior vena cava was not well visualized. IAS/Shunts: The interatrial septum was not well visualized.  LEFT VENTRICLE PLAX 2D LVIDd:         4.48 cm  Diastology LVIDs:         2.97 cm  LV e' lateral:   9.03 cm/s LV PW:         1.17 cm  LV E/e' lateral: 7.0 LV IVS:        1.18 cm  LV e' medial:    9.57 cm/s LVOT diam:     2.00 cm  LV E/e' medial:  6.6 LVOT Area:     3.14 cm  RIGHT VENTRICLE RV Mid diam:    3.18 cm RV S prime:     20.40 cm/s LEFT ATRIUM             Index       RIGHT ATRIUM           Index LA diam:        3.50 cm 1.53 cm/m  RA Area:     12.20 cm LA Vol (A2C):   49.8 ml 21.73 ml/m RA Volume:   23.60 ml  10.30 ml/m LA Vol (A4C):   42.0  ml 18.33 ml/m LA Biplane Vol: 47.5 ml 20.73 ml/m  AORTIC VALVE                PULMONIC VALVE AV Area (Vmax): 2.87 cm    PV Vmax:        1.18 m/s AV Vmax:        160.00 cm/s PV Peak grad:   5.6 mmHg AV Peak Grad:   10.2 mmHg   RVOT Peak grad: 3 mmHg LVOT Vmax:      146.00 cm/s  AORTA Ao Root diam: 3.30 cm MITRAL VALVE MV Area (PHT): 8.43 cm     SHUNTS MV Decel Time: 90 msec      Systemic Diam: 2.00 cm MV E velocity: 63.00 cm/s MV A velocity: 107.00 cm/s MV E/A ratio:  0.59 MV A Prime:    13.3 cm/s Nelva Bush MD Electronically signed by Nelva Bush MD Signature Date/Time: 09/24/2019/3:31:43 PM    Final        Assessment & Plan:   Problem List Items Addressed This Visit    Right leg swelling   Relevant Orders   US Venous Img Lower Unilateral Right (Completed)   Left knee pain    Left knee pain and swelling.  Heard a popping sensation.  Check ultrasound.  Discussed ortho referral.        Hypertension    Continue amlodipine.  Follow pressures.  Follow metabolic panel.       Hypercholesteremia    On crestor.  Low cholesterol diet and exercise.  Follow lipid panel and liver function tests.        Relevant Orders   Hepatic function panel (Completed)   Lipid panel (Completed)   Basic metabolic panel (Completed)    History of pancreatitis    Check amylase and lipase.  No pain now.  Seeing GI for w/up as outlined.        Relevant Orders   Amylase (Completed)   Lipase (Completed)   Health care maintenance    Physical today 01/06/20.  Colonoscopy 01/2011.  PSA 08/29/19 - .70      Chronic back pain    Followed by pain clinic.        Aortic dilatation [facility])    Seeing cardiology.       Anemia    Follow cbc.       Abnormal liver function tests    Seeing GI.  Being worked up by DI - elevated alkaline phos.  Recommended MRCP.         Other Visit Diagnoses    Routine general medical examination at a health care facility    -  Primary       Einar Pheasant, MD

## 2020-01-12 ENCOUNTER — Encounter: Payer: Self-pay | Admitting: Internal Medicine

## 2020-01-12 NOTE — Assessment & Plan Note (Signed)
Check amylase and lipase.  No pain now.  Seeing GI for w/up as outlined.

## 2020-01-12 NOTE — Assessment & Plan Note (Signed)
Seeing cardiology

## 2020-01-12 NOTE — Assessment & Plan Note (Signed)
Followed by pain clinic.  

## 2020-01-12 NOTE — Assessment & Plan Note (Signed)
Follow cbc.  

## 2020-01-12 NOTE — Assessment & Plan Note (Signed)
Left knee pain and swelling.  Heard a popping sensation.  Check ultrasound.  Discussed ortho referral.

## 2020-01-12 NOTE — Assessment & Plan Note (Signed)
On crestor.  Low cholesterol diet and exercise.  Follow lipid panel and liver function tests.   

## 2020-01-12 NOTE — Assessment & Plan Note (Signed)
Seeing GI.  Being worked up by DI - elevated alkaline phos.  Recommended MRCP.

## 2020-01-12 NOTE — Assessment & Plan Note (Signed)
Continue amlodipine.  Follow pressures.  Follow metabolic panel.   

## 2020-01-12 NOTE — Assessment & Plan Note (Signed)
Physical today 01/06/20.  Colonoscopy 01/2011.  PSA 08/29/19 - .70

## 2020-02-12 DIAGNOSIS — M5030 Other cervical disc degeneration, unspecified cervical region: Secondary | ICD-10-CM | POA: Diagnosis not present

## 2020-02-12 DIAGNOSIS — G894 Chronic pain syndrome: Secondary | ICD-10-CM | POA: Diagnosis not present

## 2020-02-12 DIAGNOSIS — M7918 Myalgia, other site: Secondary | ICD-10-CM | POA: Diagnosis not present

## 2020-02-12 DIAGNOSIS — M19011 Primary osteoarthritis, right shoulder: Secondary | ICD-10-CM | POA: Diagnosis not present

## 2020-02-12 DIAGNOSIS — M25522 Pain in left elbow: Secondary | ICD-10-CM | POA: Diagnosis not present

## 2020-02-12 DIAGNOSIS — M5136 Other intervertebral disc degeneration, lumbar region: Secondary | ICD-10-CM | POA: Diagnosis not present

## 2020-02-12 DIAGNOSIS — M546 Pain in thoracic spine: Secondary | ICD-10-CM | POA: Diagnosis not present

## 2020-02-12 DIAGNOSIS — R202 Paresthesia of skin: Secondary | ICD-10-CM | POA: Diagnosis not present

## 2020-02-12 DIAGNOSIS — G8929 Other chronic pain: Secondary | ICD-10-CM | POA: Diagnosis not present

## 2020-02-12 DIAGNOSIS — K861 Other chronic pancreatitis: Secondary | ICD-10-CM | POA: Diagnosis not present

## 2020-02-12 DIAGNOSIS — M79602 Pain in left arm: Secondary | ICD-10-CM | POA: Diagnosis not present

## 2020-02-26 DIAGNOSIS — R002 Palpitations: Secondary | ICD-10-CM | POA: Diagnosis not present

## 2020-02-26 DIAGNOSIS — I10 Essential (primary) hypertension: Secondary | ICD-10-CM | POA: Diagnosis not present

## 2020-02-26 DIAGNOSIS — R55 Syncope and collapse: Secondary | ICD-10-CM | POA: Diagnosis not present

## 2020-02-26 DIAGNOSIS — E7800 Pure hypercholesterolemia, unspecified: Secondary | ICD-10-CM | POA: Diagnosis not present

## 2020-03-09 DIAGNOSIS — M545 Low back pain, unspecified: Secondary | ICD-10-CM | POA: Diagnosis not present

## 2020-03-09 DIAGNOSIS — M25511 Pain in right shoulder: Secondary | ICD-10-CM | POA: Diagnosis not present

## 2020-03-09 DIAGNOSIS — M5030 Other cervical disc degeneration, unspecified cervical region: Secondary | ICD-10-CM | POA: Diagnosis not present

## 2020-03-09 DIAGNOSIS — R202 Paresthesia of skin: Secondary | ICD-10-CM | POA: Diagnosis not present

## 2020-03-09 DIAGNOSIS — M79602 Pain in left arm: Secondary | ICD-10-CM | POA: Diagnosis not present

## 2020-03-09 DIAGNOSIS — G8929 Other chronic pain: Secondary | ICD-10-CM | POA: Diagnosis not present

## 2020-03-09 DIAGNOSIS — M7918 Myalgia, other site: Secondary | ICD-10-CM | POA: Diagnosis not present

## 2020-03-09 DIAGNOSIS — M25562 Pain in left knee: Secondary | ICD-10-CM | POA: Diagnosis not present

## 2020-03-09 DIAGNOSIS — M25561 Pain in right knee: Secondary | ICD-10-CM | POA: Diagnosis not present

## 2020-03-09 DIAGNOSIS — M25522 Pain in left elbow: Secondary | ICD-10-CM | POA: Diagnosis not present

## 2020-03-09 DIAGNOSIS — Z9889 Other specified postprocedural states: Secondary | ICD-10-CM | POA: Diagnosis not present

## 2020-03-09 DIAGNOSIS — G894 Chronic pain syndrome: Secondary | ICD-10-CM | POA: Diagnosis not present

## 2020-03-09 DIAGNOSIS — M5136 Other intervertebral disc degeneration, lumbar region: Secondary | ICD-10-CM | POA: Diagnosis not present

## 2020-03-09 DIAGNOSIS — R200 Anesthesia of skin: Secondary | ICD-10-CM | POA: Diagnosis not present

## 2020-03-09 DIAGNOSIS — M25512 Pain in left shoulder: Secondary | ICD-10-CM | POA: Diagnosis not present

## 2020-03-10 DIAGNOSIS — F410 Panic disorder [episodic paroxysmal anxiety] without agoraphobia: Secondary | ICD-10-CM | POA: Diagnosis not present

## 2020-03-10 DIAGNOSIS — R69 Illness, unspecified: Secondary | ICD-10-CM | POA: Diagnosis not present

## 2020-03-15 ENCOUNTER — Other Ambulatory Visit: Payer: Self-pay | Admitting: Internal Medicine

## 2020-04-06 DIAGNOSIS — M25561 Pain in right knee: Secondary | ICD-10-CM | POA: Diagnosis not present

## 2020-04-06 DIAGNOSIS — M79672 Pain in left foot: Secondary | ICD-10-CM | POA: Diagnosis not present

## 2020-04-06 DIAGNOSIS — M47812 Spondylosis without myelopathy or radiculopathy, cervical region: Secondary | ICD-10-CM | POA: Diagnosis not present

## 2020-04-06 DIAGNOSIS — M79642 Pain in left hand: Secondary | ICD-10-CM | POA: Diagnosis not present

## 2020-04-06 DIAGNOSIS — Z0100 Encounter for examination of eyes and vision without abnormal findings: Secondary | ICD-10-CM | POA: Diagnosis not present

## 2020-04-06 DIAGNOSIS — M2550 Pain in unspecified joint: Secondary | ICD-10-CM | POA: Diagnosis not present

## 2020-04-06 DIAGNOSIS — M47816 Spondylosis without myelopathy or radiculopathy, lumbar region: Secondary | ICD-10-CM | POA: Diagnosis not present

## 2020-04-06 DIAGNOSIS — M25562 Pain in left knee: Secondary | ICD-10-CM | POA: Diagnosis not present

## 2020-04-06 DIAGNOSIS — M79641 Pain in right hand: Secondary | ICD-10-CM | POA: Diagnosis not present

## 2020-04-06 DIAGNOSIS — R768 Other specified abnormal immunological findings in serum: Secondary | ICD-10-CM | POA: Diagnosis not present

## 2020-04-06 DIAGNOSIS — R5383 Other fatigue: Secondary | ICD-10-CM | POA: Diagnosis not present

## 2020-04-08 DIAGNOSIS — M5030 Other cervical disc degeneration, unspecified cervical region: Secondary | ICD-10-CM | POA: Diagnosis not present

## 2020-04-08 DIAGNOSIS — M79602 Pain in left arm: Secondary | ICD-10-CM | POA: Diagnosis not present

## 2020-04-08 DIAGNOSIS — M5136 Other intervertebral disc degeneration, lumbar region: Secondary | ICD-10-CM | POA: Diagnosis not present

## 2020-04-08 DIAGNOSIS — M79605 Pain in left leg: Secondary | ICD-10-CM | POA: Diagnosis not present

## 2020-04-08 DIAGNOSIS — R202 Paresthesia of skin: Secondary | ICD-10-CM | POA: Diagnosis not present

## 2020-04-08 DIAGNOSIS — Z79899 Other long term (current) drug therapy: Secondary | ICD-10-CM | POA: Diagnosis not present

## 2020-04-08 DIAGNOSIS — G8929 Other chronic pain: Secondary | ICD-10-CM | POA: Diagnosis not present

## 2020-04-08 DIAGNOSIS — G894 Chronic pain syndrome: Secondary | ICD-10-CM | POA: Diagnosis not present

## 2020-04-08 DIAGNOSIS — M7918 Myalgia, other site: Secondary | ICD-10-CM | POA: Diagnosis not present

## 2020-04-08 DIAGNOSIS — Z760 Encounter for issue of repeat prescription: Secondary | ICD-10-CM | POA: Diagnosis not present

## 2020-04-21 DIAGNOSIS — R748 Abnormal levels of other serum enzymes: Secondary | ICD-10-CM | POA: Diagnosis not present

## 2020-04-21 DIAGNOSIS — K7581 Nonalcoholic steatohepatitis (NASH): Secondary | ICD-10-CM | POA: Diagnosis not present

## 2020-04-21 DIAGNOSIS — Z23 Encounter for immunization: Secondary | ICD-10-CM | POA: Diagnosis not present

## 2020-05-06 DIAGNOSIS — M19011 Primary osteoarthritis, right shoulder: Secondary | ICD-10-CM | POA: Diagnosis not present

## 2020-05-06 DIAGNOSIS — K8681 Exocrine pancreatic insufficiency: Secondary | ICD-10-CM | POA: Diagnosis not present

## 2020-05-06 DIAGNOSIS — M5136 Other intervertebral disc degeneration, lumbar region: Secondary | ICD-10-CM | POA: Diagnosis not present

## 2020-05-06 DIAGNOSIS — M5030 Other cervical disc degeneration, unspecified cervical region: Secondary | ICD-10-CM | POA: Diagnosis not present

## 2020-05-06 DIAGNOSIS — G8929 Other chronic pain: Secondary | ICD-10-CM | POA: Diagnosis not present

## 2020-05-06 DIAGNOSIS — R202 Paresthesia of skin: Secondary | ICD-10-CM | POA: Diagnosis not present

## 2020-05-06 DIAGNOSIS — M7918 Myalgia, other site: Secondary | ICD-10-CM | POA: Diagnosis not present

## 2020-05-06 DIAGNOSIS — M19022 Primary osteoarthritis, left elbow: Secondary | ICD-10-CM | POA: Diagnosis not present

## 2020-05-06 DIAGNOSIS — G894 Chronic pain syndrome: Secondary | ICD-10-CM | POA: Diagnosis not present

## 2020-05-06 DIAGNOSIS — K861 Other chronic pancreatitis: Secondary | ICD-10-CM | POA: Diagnosis not present

## 2020-05-06 DIAGNOSIS — M79602 Pain in left arm: Secondary | ICD-10-CM | POA: Diagnosis not present

## 2020-05-07 ENCOUNTER — Other Ambulatory Visit: Payer: Self-pay

## 2020-05-07 ENCOUNTER — Ambulatory Visit (INDEPENDENT_AMBULATORY_CARE_PROVIDER_SITE_OTHER): Payer: Medicare HMO | Admitting: Internal Medicine

## 2020-05-07 ENCOUNTER — Encounter: Payer: Self-pay | Admitting: Internal Medicine

## 2020-05-07 VITALS — BP 122/78 | HR 98 | Temp 97.8°F | Resp 16 | Ht 72.0 in | Wt 211.0 lb

## 2020-05-07 DIAGNOSIS — L723 Sebaceous cyst: Secondary | ICD-10-CM

## 2020-05-07 DIAGNOSIS — E7800 Pure hypercholesterolemia, unspecified: Secondary | ICD-10-CM | POA: Diagnosis not present

## 2020-05-07 DIAGNOSIS — M545 Low back pain, unspecified: Secondary | ICD-10-CM | POA: Diagnosis not present

## 2020-05-07 DIAGNOSIS — D649 Anemia, unspecified: Secondary | ICD-10-CM | POA: Diagnosis not present

## 2020-05-07 DIAGNOSIS — I10 Essential (primary) hypertension: Secondary | ICD-10-CM

## 2020-05-07 DIAGNOSIS — R7989 Other specified abnormal findings of blood chemistry: Secondary | ICD-10-CM

## 2020-05-07 DIAGNOSIS — R5383 Other fatigue: Secondary | ICD-10-CM

## 2020-05-07 DIAGNOSIS — R945 Abnormal results of liver function studies: Secondary | ICD-10-CM | POA: Diagnosis not present

## 2020-05-07 DIAGNOSIS — I77819 Aortic ectasia, unspecified site: Secondary | ICD-10-CM | POA: Diagnosis not present

## 2020-05-07 DIAGNOSIS — G8929 Other chronic pain: Secondary | ICD-10-CM

## 2020-05-07 LAB — CBC WITH DIFFERENTIAL/PLATELET
Basophils Absolute: 0 10*3/uL (ref 0.0–0.1)
Basophils Relative: 0.5 % (ref 0.0–3.0)
Eosinophils Absolute: 0.2 10*3/uL (ref 0.0–0.7)
Eosinophils Relative: 2.4 % (ref 0.0–5.0)
HCT: 39.7 % (ref 39.0–52.0)
Hemoglobin: 13.7 g/dL (ref 13.0–17.0)
Lymphocytes Relative: 28.8 % (ref 12.0–46.0)
Lymphs Abs: 1.9 10*3/uL (ref 0.7–4.0)
MCHC: 34.4 g/dL (ref 30.0–36.0)
MCV: 89.6 fl (ref 78.0–100.0)
Monocytes Absolute: 0.7 10*3/uL (ref 0.1–1.0)
Monocytes Relative: 10.3 % (ref 3.0–12.0)
Neutro Abs: 3.9 10*3/uL (ref 1.4–7.7)
Neutrophils Relative %: 58 % (ref 43.0–77.0)
Platelets: 354 10*3/uL (ref 150.0–400.0)
RBC: 4.43 Mil/uL (ref 4.22–5.81)
RDW: 13.2 % (ref 11.5–15.5)
WBC: 6.7 10*3/uL (ref 4.0–10.5)

## 2020-05-07 LAB — BASIC METABOLIC PANEL
BUN: 17 mg/dL (ref 6–23)
CO2: 28 mEq/L (ref 19–32)
Calcium: 9.4 mg/dL (ref 8.4–10.5)
Chloride: 100 mEq/L (ref 96–112)
Creatinine, Ser: 1 mg/dL (ref 0.40–1.50)
GFR: 83.43 mL/min (ref >60.00)
Glucose, Bld: 92 mg/dL (ref 70–99)
Potassium: 4.5 mEq/L (ref 3.5–5.1)
Sodium: 135 mEq/L (ref 135–145)

## 2020-05-07 LAB — LIPID PANEL
Cholesterol: 121 mg/dL (ref 0–200)
HDL: 55.6 mg/dL (ref >39.00)
LDL Cholesterol: 48 mg/dL (ref 0–99)
NonHDL: 65.78
Total CHOL/HDL Ratio: 2
Triglycerides: 91 mg/dL (ref 0.0–149.0)
VLDL: 18.2 mg/dL (ref 0.0–40.0)

## 2020-05-07 LAB — HEPATIC FUNCTION PANEL
ALT: 26 U/L (ref 0–53)
AST: 23 U/L (ref 0–37)
Albumin: 4.3 g/dL (ref 3.5–5.2)
Alkaline Phosphatase: 174 U/L — ABNORMAL HIGH (ref 39–117)
Bilirubin, Direct: 0.1 mg/dL (ref 0.0–0.3)
Total Bilirubin: 0.4 mg/dL (ref 0.2–1.2)
Total Protein: 7.4 g/dL (ref 6.0–8.3)

## 2020-05-07 NOTE — Progress Notes (Signed)
Patient ID: [name], male   DOB: Mar 14, 1960, 60 y.o.   MRN: [id number]   Subjective:    Patient ID: [name], male    DOB: 10-Feb-1960, 60 y.o.   MRN: [id number]  HPI This visit occurred during the SARS-CoV-2 public health emergency.  Safety protocols were in place, including screening questions prior to the visit, additional usage of staff PPE, and extensive cleaning of exam room while observing appropriate contact time as indicated for disinfecting solutions.  Patient here for a scheduled follow up. He reports increased fatigue. Recently evaluated by rheumatology for polyarthralgia and positive rheumatoid factor.  Was placed on short course of prednisone.  States for the first 5 days, felt god.  Had increased energy, etc.  After the 5 days, fatigue returned.  Was evaluated recently for near syncopal episode.  W/up - CT negative.  Chest CT - negative for pulmonary emboli, cxr negative, carotid ultrasound unremarkable and ECHO - normal LV function 60-65%.  Recently had f/u with cardiology.  Lexiscan myoview 07/2016 - EF 66% without evidence of scar or ischemia.  No further syncope or near syncope.  No chest pain.  Breathing stable.  Cardiology felt no further w/up warranted.  He reports general fatigue.  Does not sleep well. Some nights takes 4 benadryl per night.  Has tapered down gabapentin.  Possible some minimal snoring. Does not feel rested when wakes up.  Has had sleep study previously - 2018 - ok.  Has lost weight.  States has adjusted his diet.  Not eating well - states eats mostly chicken noodle soup and boiled eggs.  Not eating regular meals.  No nausea or vomiting.  No abdominal pain.  No diarrhea.  Also has a cyst on his back - request surgery referral for removal.    Past Medical History:  Diagnosis Date  . Allergy   . Anoxic brain injury ([place])   . Chronic back pain    s/p 5 back surgeries  . Coronary artery disease    Cardiac catheterization in 2007 at [facility] showed 30%  stenosis in proximal LAD. No other disease. Ejection fraction was 65%  . Depression   . H/O acute pancreatitis   . Hypercholesterolemia   . Mild cognitive impairment with memory loss   . Pancreatitis    Past Surgical History:  Procedure Laterality Date  . abdomial surgery    . BACK SURGERY     multiple back surgeries (5)  . CARDIAC CATHETERIZATION     [facility]   . CHOLECYSTECTOMY  2013  . COLONOSCOPY WITH PROPOFOL N/A 06/30/2015   Procedure: COLONOSCOPY WITH PROPOFOL;  Surgeon: Lollie Sails, MD;  Location: [facility] ENDOSCOPY;  Service: Endoscopy;  Laterality: N/A;  . ESOPHAGOGASTRODUODENOSCOPY (EGD) WITH PROPOFOL N/A 03/30/2016   Procedure: ESOPHAGOGASTRODUODENOSCOPY (EGD) WITH PROPOFOL;  Surgeon: Manya Silvas, MD;  Location[facility] ENDOSCOPY;  Service: Endoscopy;  Laterality: N/A;  . FUNCTIONAL ENDOSCOPIC SINUS SURGERY    . JOINT REPLACEMENT    . LAMINECTOMY    . LUMBAR SPINE SURGERY    . SHOULDER ARTHROSCOPY WITH ROTATOR CUFF REPAIR AND OPEN BICEPS TENODESIS Right   . SHOULDER ARTHROSCOPY WITH ROTATOR CUFF REPAIR AND OPEN BICEPS TENODESIS    . SPINE SURGERY    . TRIGGER POINT INJECTION    . VASECTOMY     With Reversal  . VEIN SURGERY Right 2013   arm   Family History  Problem Relation Age of Onset  . Heart disease Father   . Heart  attack Father   . Heart disease Mother   . Heart attack Mother   . Heart attack Brother        MI's x 2   . Hypertension Other   . Colon cancer Other   . Hypertension Sister   . Heart attack Paternal Grandfather   . Hypertension Sister    Social History   Socioeconomic History  . Marital status: Divorced    Spouse name: Not on file  . Number of children: 3  . Years of education: Not on file  . Highest education level: Not on file  Occupational History  . Not on file  Tobacco Use  . Smoking status: Never Smoker  . Smokeless tobacco: Never Used  Vaping Use  . Vaping Use: Never used  Substance and Sexual Activity  . Alcohol use: No     Alcohol/week: 0.0 standard drinks  . Drug use: No  . Sexual activity: Yes  Other Topics Concern  . Not on file  Social History Narrative  . Not on file   Social Determinants of Health   Financial Resource Strain: Not on file  Food Insecurity: Not on file  Transportation Needs: Not on file  Physical Activity: Not on file  Stress: Not on file  Social Connections: Not on file    Outpatient Encounter Medications as of 05/07/2020  Medication Sig  . amLODipine (NORVASC) 5 MG tablet TAKE 1 TABLET BY MOUTH TWICE A DAY  . aspirin 81 MG chewable tablet Chew 81 mg by mouth daily as needed for moderate pain.   Marland Kitchen atomoxetine (STRATTERA) 100 MG capsule Take 100 mg by mouth daily.  . fluticasone (FLONASE) 50 MCG/ACT nasal spray SPRAY 2 SPRAYS INTO EACH NOSTRIL EVERY DAY  . gabapentin (NEURONTIN) 300 MG capsule Take 1 or 2 capsules by mouth TID  . modafinil (PROVIGIL) 200 MG tablet Take 400 mg by mouth in the morning.  Marland Kitchen oxyCODONE (OXY IR/ROXICODONE) 5 MG immediate release tablet Take 5-10 mg by mouth at bedtime as needed for moderate pain.   . pantoprazole (PROTONIX) 40 MG tablet Take 40 mg by mouth 2 (two) times daily.  . rosuvastatin (CRESTOR) 5 MG tablet TAKE 1 TABLET BY MOUTH EVERY DAY  . sildenafil (REVATIO) 20 MG tablet TAKE 4 - 5 TABLETS BY MOUTH DAILY AS NEEDED  . sucralfate (CARAFATE) 1 g tablet Take 1 g by mouth daily.   . tamsulosin (FLOMAX) 0.4 MG CAPS capsule Take 0.4 mg by mouth daily.  Marland Kitchen tiZANidine (ZANAFLEX) 4 MG tablet TAKE 1 - 2 TABLETS BY MOUTH THREE TIMES DAILY AS NEEDED  . [DISCONTINUED] doxycycline (VIBRA-TABS) 100 MG tablet Take 1 tablet (100 mg total) by mouth 2 (two) times daily.   No facility-administered encounter medications on file as of 05/07/2020.    Review of Systems  Constitutional: Positive for fatigue.       Weight loss as outlined.   HENT: Negative for congestion.   Respiratory: Negative for cough, chest tightness and shortness of breath.    Cardiovascular: Negative for chest pain, palpitations and leg swelling.  Gastrointestinal: Negative for abdominal pain, diarrhea, nausea and vomiting.  Genitourinary: Negative for difficulty urinating and dysuria.  Musculoskeletal: Negative for gait problem and joint swelling.  Skin: Negative for color change and rash.  Neurological: Negative for dizziness, light-headedness and headaches.  Psychiatric/Behavioral: Negative for agitation and dysphoric mood.       Objective:    Physical Exam Vitals reviewed.  Constitutional:      General:  He is not in acute distress.    Appearance: Normal appearance. He is well-developed.  HENT:     Head: Normocephalic and atraumatic.     Right Ear: External ear normal.     Left Ear: External ear normal.  Eyes:     General: No scleral icterus.       Right eye: No discharge.        Left eye: No discharge.     Conjunctiva/sclera: Conjunctivae normal.  Cardiovascular:     Rate and Rhythm: Normal rate and regular rhythm.  Pulmonary:     Effort: Pulmonary effort is normal. No respiratory distress.     Breath sounds: Normal breath sounds.  Abdominal:     General: Bowel sounds are normal.     Palpations: Abdomen is soft.     Tenderness: There is no abdominal tenderness.  Musculoskeletal:        General: No swelling or tenderness.     Cervical back: Neck supple. No tenderness.  Lymphadenopathy:     Cervical: No cervical adenopathy.  Skin:    Findings: No erythema or rash.  Neurological:     Mental Status: He is alert.  Psychiatric:        Mood and Affect: Mood normal.        Behavior: Behavior normal.     BP 122/78   Pulse 98   Temp 97.8 F (36.6 C) (Oral)   Resp 16   Ht 6' (1.829 m)   Wt 211 lb (95.7 kg)   SpO2 98%   BMI 28.62 kg/m  Wt Readings from Last 3 Encounters:  05/07/20 211 lb (95.7 kg)  10/01/19 225 lb (102.1 kg)  09/23/19 232 lb (105.2 kg)     Lab Results  Component Value Date   WBC 6.7 05/07/2020   HGB 13.7  05/07/2020   HCT 39.7 05/07/2020   PLT 354.0 05/07/2020   GLUCOSE 92 05/07/2020   CHOL 121 05/07/2020   TRIG 91.0 05/07/2020   HDL 55.60 05/07/2020   LDLCALC 48 05/07/2020   ALT 26 05/07/2020   AST 23 05/07/2020   NA 135 05/07/2020   K 4.5 05/07/2020   CL 100 05/07/2020   CREATININE 1.00 05/07/2020   BUN 17 05/07/2020   CO2 28 05/07/2020   TSH 0.729 09/23/2019   PSA 0.70 08/29/2019   INR 1.0 09/24/2019   HGBA1C 5.6 09/23/2019    US Venous Img Lower Unilateral Right  Result Date: 01/06/2020 CLINICAL DATA:  60 year old male with right lower extremity swelling for 1 week. EXAM: RIGHT LOWER EXTREMITY VENOUS DOPPLER ULTRASOUND TECHNIQUE: Gray-scale sonography with graded compression, as well as color Doppler and duplex ultrasound were performed to evaluate the right lower extremity deep venous systems from the level of the common femoral vein and including the common femoral, femoral, profunda femoral, popliteal and calf veins including the posterior tibial, peroneal and gastrocnemius veins when visible. Spectral Doppler was utilized to evaluate flow at rest and with distal augmentation maneuvers in the common femoral, femoral and popliteal veins. The contralateral common femoral vein was also evaluated for comparison. COMPARISON:  None. FINDINGS: RIGHT LOWER EXTREMITY Common Femoral Vein: No evidence of thrombus. Normal compressibility, respiratory phasicity and response to augmentation. Central Greater Saphenous Vein: No evidence of thrombus. Normal compressibility and flow on color Doppler imaging. Central Profunda Femoral Vein: No evidence of thrombus. Normal compressibility and flow on color Doppler imaging. Femoral Vein: No evidence of thrombus. Normal compressibility, respiratory phasicity and response to augmentation. Popliteal  Vein: No evidence of thrombus. Normal compressibility, respiratory phasicity and response to augmentation. Calf Veins: No evidence of thrombus. Normal  compressibility and flow on color Doppler imaging. Other Findings: Irregularly-shaped simple fluid collection subcutaneous tissues about the superior aspect of the anterior right knee measuring approximately 6.4 x 1.6 x 5.6 cm. On this limited sonographic evaluation, it is uncertain if this collection is within the joint capsule. LEFT LOWER EXTREMITY Common Femoral Vein: No evidence of thrombus. Normal compressibility, respiratory phasicity and response to augmentation. IMPRESSION: 1. No evidence of right lower extremity deep venous thrombosis. 2. Indeterminate simple fluid collection in the anterior aspect of the superior right knee as could be seen with simple subcutaneous fluid collection versus joint effusion. Recommend correlation with physical examination and consider knee radiographs as clinically indicated. Ruthann Cancer, MD [facility] [address] [facility] Electronically Signed   By: Ruthann Cancer MD   On: 01/06/2020 11:10       Assessment & Plan:   Problem List Items Addressed This Visit    Abnormal liver function tests    Found to have elevated alkaline phos.  Evaluated by GI.  Recheck liver panel.       Anemia    Follow cbc.       Aortic dilatation (HCC)    Followed by cardiology.       Chronic back pain    Followed by pain clinic/[facility].       Fatigue - Primary    Increased fatigue.  Probably multifactorial.  Instructed on improving his diet.  Discussed the need for regular exercise.  Just had cardiac evaluation.  Felt stable.  No increased sob.  Does have the sleep issues.  Discussed the need to taper off benadryl.  Has gabapentin to take q hs.  Reports problems sleeping. Does not feel rested when wakes up.  Some questionable snoring.  Had sleep study in 2018 - ok.  Discussed reevaluation by pulmonary to evaluate sleep and question of sleep apnea.  Check routine labs to confirm no metabolic etiology.  Just had thyroid checked and wnl.        Relevant Orders   CBC with Differential/Platelet (Completed)   Hepatic function panel (Completed)   Basic metabolic panel (Completed)   Ambulatory referral to Pulmonology   Hypercholesteremia    Low cholesterol diet and exercise.  Continue crestor.  Check lipid panel and liver function tests.        Relevant Orders   Lipid panel (Completed)   Hypertension    Blood pressure on checks today wnl.  Continue amlodipine.  Follow pressures.  Follow metabolic panel.       Sebaceous cyst    Previously saw surgery for cyst removal.  Recently drained by dermatology.  Persistent.  Request referral to surgery.           Einar Pheasant, MD

## 2020-05-08 ENCOUNTER — Encounter: Payer: Self-pay | Admitting: Internal Medicine

## 2020-05-08 NOTE — Assessment & Plan Note (Signed)
Follow cbc.  

## 2020-05-08 NOTE — Assessment & Plan Note (Signed)
Low cholesterol diet and exercise.  Continue crestor.  Check lipid panel and liver function tests.   

## 2020-05-08 NOTE — Assessment & Plan Note (Signed)
Blood pressure on checks today wnl.  Continue amlodipine.  Follow pressures.  Follow metabolic panel.

## 2020-05-08 NOTE — Assessment & Plan Note (Signed)
Followed by cardiology 

## 2020-05-08 NOTE — Assessment & Plan Note (Signed)
Followed by pain clinic/[facility].

## 2020-05-08 NOTE — Assessment & Plan Note (Signed)
Found to have elevated alkaline phos.  Evaluated by GI.  Recheck liver panel.

## 2020-05-08 NOTE — Assessment & Plan Note (Signed)
Previously saw surgery for cyst removal.  Recently drained by dermatology.  Persistent.  Request referral to surgery.

## 2020-05-08 NOTE — Assessment & Plan Note (Signed)
Increased fatigue.  Probably multifactorial.  Instructed on improving his diet.  Discussed the need for regular exercise.  Just had cardiac evaluation.  Felt stable.  No increased sob.  Does have the sleep issues.  Discussed the need to taper off benadryl.  Has gabapentin to take q hs.  Reports problems sleeping. Does not feel rested when wakes up.  Some questionable snoring.  Had sleep study in 2018 - ok.  Discussed reevaluation by pulmonary to evaluate sleep and question of sleep apnea.  Check routine labs to confirm no metabolic etiology.  Just had thyroid checked and wnl.

## 2020-05-14 ENCOUNTER — Other Ambulatory Visit: Payer: Self-pay | Admitting: Internal Medicine

## 2020-05-19 DIAGNOSIS — M059 Rheumatoid arthritis with rheumatoid factor, unspecified: Secondary | ICD-10-CM | POA: Diagnosis not present

## 2020-05-19 DIAGNOSIS — M47816 Spondylosis without myelopathy or radiculopathy, lumbar region: Secondary | ICD-10-CM | POA: Diagnosis not present

## 2020-05-19 DIAGNOSIS — R5383 Other fatigue: Secondary | ICD-10-CM | POA: Diagnosis not present

## 2020-05-19 DIAGNOSIS — M47812 Spondylosis without myelopathy or radiculopathy, cervical region: Secondary | ICD-10-CM | POA: Diagnosis not present

## 2020-05-19 DIAGNOSIS — Z79899 Other long term (current) drug therapy: Secondary | ICD-10-CM | POA: Diagnosis not present

## 2020-05-27 ENCOUNTER — Encounter: Payer: Self-pay | Admitting: Internal Medicine

## 2020-05-28 NOTE — Telephone Encounter (Signed)
Called patient to confirm no thoughts of harming himself or harming others. Patient confirmed these symptoms have been going on since he got his first covid booster. I have scheduled virtual with Dr Nicki Reaper to discuss concerns

## 2020-06-01 ENCOUNTER — Telehealth (INDEPENDENT_AMBULATORY_CARE_PROVIDER_SITE_OTHER): Payer: Medicare HMO | Admitting: Internal Medicine

## 2020-06-01 ENCOUNTER — Encounter: Payer: Self-pay | Admitting: Internal Medicine

## 2020-06-01 DIAGNOSIS — M19011 Primary osteoarthritis, right shoulder: Secondary | ICD-10-CM | POA: Diagnosis not present

## 2020-06-01 DIAGNOSIS — R69 Illness, unspecified: Secondary | ICD-10-CM | POA: Diagnosis not present

## 2020-06-01 DIAGNOSIS — F32A Depression, unspecified: Secondary | ICD-10-CM

## 2020-06-01 DIAGNOSIS — M069 Rheumatoid arthritis, unspecified: Secondary | ICD-10-CM | POA: Diagnosis not present

## 2020-06-01 DIAGNOSIS — M7918 Myalgia, other site: Secondary | ICD-10-CM | POA: Diagnosis not present

## 2020-06-01 DIAGNOSIS — G8929 Other chronic pain: Secondary | ICD-10-CM | POA: Diagnosis not present

## 2020-06-01 DIAGNOSIS — I10 Essential (primary) hypertension: Secondary | ICD-10-CM | POA: Diagnosis not present

## 2020-06-01 DIAGNOSIS — M5136 Other intervertebral disc degeneration, lumbar region: Secondary | ICD-10-CM | POA: Diagnosis not present

## 2020-06-01 DIAGNOSIS — M5137 Other intervertebral disc degeneration, lumbosacral region: Secondary | ICD-10-CM | POA: Diagnosis not present

## 2020-06-01 DIAGNOSIS — M79602 Pain in left arm: Secondary | ICD-10-CM | POA: Diagnosis not present

## 2020-06-01 DIAGNOSIS — F320 Major depressive disorder, single episode, mild: Secondary | ICD-10-CM | POA: Diagnosis not present

## 2020-06-01 DIAGNOSIS — M5030 Other cervical disc degeneration, unspecified cervical region: Secondary | ICD-10-CM | POA: Diagnosis not present

## 2020-06-01 DIAGNOSIS — R202 Paresthesia of skin: Secondary | ICD-10-CM | POA: Diagnosis not present

## 2020-06-01 DIAGNOSIS — G894 Chronic pain syndrome: Secondary | ICD-10-CM | POA: Diagnosis not present

## 2020-06-01 DIAGNOSIS — K861 Other chronic pancreatitis: Secondary | ICD-10-CM | POA: Diagnosis not present

## 2020-06-01 DIAGNOSIS — M19022 Primary osteoarthritis, left elbow: Secondary | ICD-10-CM | POA: Diagnosis not present

## 2020-06-01 NOTE — Progress Notes (Addendum)
Patient ID: [name], male   DOB: Dec 15, 1960, 60 y.o.   MRN: [id number]   Virtual Visit via video Note  This visit type was conducted due to national recommendations for restrictions regarding the COVID-19 pandemic (e.g. social distancing).  This format is felt to be most appropriate for this patient at this time.  All issues noted in this document were discussed and addressed.  No physical exam was performed (except for noted visual exam findings with Video Visits).   I connected with Arcadio Labrecque by a video enabled telemedicine application and verified that I am speaking with the correct person using two identifiers. Location patient: home Location provider: work Persons participating in the virtual visit: patient, provider  The limitations, risks, security and privacy concerns of performing an evaluation and management service by video and the availability of in person appointments have been discussed.  It has also been discussed with the patient that there may be a patient responsible charge related to this service. The patient expressed understanding and agreed to proceed.   Reason for visit: work in appt  HPI: Work in with concerns regarding increased stress, depression and problems sleeping.  Has booster 05/01/20.  Increased stress.  Has not been sleeping.  Recently started back on neurontin 300mg  q hs - last week.  Sleeping better now.  Feeling better.  Was previously not wanting to be around people.  Stopped doing things he enjoyed.  Now that he is sleeping, feels better.  Starting to get back into more of his normal routine.  Working - Financial planner, doing hardwoods. Received injections back today.  No chest pain or sob reported.  No abdominal pain.  Bowels moving.  Recently prescribed hydroxychloroquine for concern regarding RA.     ROS: See pertinent positives and negatives per HPI.  Past Medical History:  Diagnosis Date  . Allergy   . Anoxic brain injury ([place])   . Chronic  back pain    s/p 5 back surgeries  . Coronary artery disease    Cardiac catheterization in 2007 at [facility] showed 30% stenosis in proximal LAD. No other disease. Ejection fraction was 65%  . Depression   . H/O acute pancreatitis   . Hypercholesterolemia   . Mild cognitive impairment with memory loss   . Pancreatitis     Past Surgical History:  Procedure Laterality Date  . abdomial surgery    . BACK SURGERY     multiple back surgeries (5)  . CARDIAC CATHETERIZATION     [facility]   . CHOLECYSTECTOMY  2013  . COLONOSCOPY WITH PROPOFOL N/A 06/30/2015   Procedure: COLONOSCOPY WITH PROPOFOL;  Surgeon: Lollie Sails, MD;  Location: [facility] ENDOSCOPY;  Service: Endoscopy;  Laterality: N/A;  . ESOPHAGOGASTRODUODENOSCOPY (EGD) WITH PROPOFOL N/A 03/30/2016   Procedure: ESOPHAGOGASTRODUODENOSCOPY (EGD) WITH PROPOFOL;  Surgeon: Manya Silvas, MD;  Location: [facility] ENDOSCOPY;  Service: Endoscopy;  Laterality: N/A;  . FUNCTIONAL ENDOSCOPIC SINUS SURGERY    . JOINT REPLACEMENT    . LAMINECTOMY    . LUMBAR SPINE SURGERY    . SHOULDER ARTHROSCOPY WITH ROTATOR CUFF REPAIR AND OPEN BICEPS TENODESIS Right   . SHOULDER ARTHROSCOPY WITH ROTATOR CUFF REPAIR AND OPEN BICEPS TENODESIS    . SPINE SURGERY    . TRIGGER POINT INJECTION    . VASECTOMY     With Reversal  . VEIN SURGERY Right 2013   arm    Family History  Problem Relation Age of Onset  . Heart disease Father   .  Heart attack Father   . Heart disease Mother   . Heart attack Mother   . Heart attack Brother        MI's x 2   . Hypertension Other   . Colon cancer Other   . Hypertension Sister   . Heart attack Paternal Grandfather   . Hypertension Sister     SOCIAL HX: reviewed.    Current Outpatient Medications:  .  aspirin 81 MG chewable tablet, Chew 81 mg by mouth daily as needed for moderate pain. , Disp: , Rfl:  .  atomoxetine (STRATTERA) 100 MG capsule, Take 100 mg by mouth daily., Disp: , Rfl:  .  fluticasone (FLONASE) 50 MCG/ACT  nasal spray, SPRAY 2 SPRAYS INTO EACH NOSTRIL EVERY DAY, Disp: 48 mL, Rfl: 1 .  gabapentin (NEURONTIN) 300 MG capsule, Take 1 or 2 capsules by mouth TID, Disp: , Rfl:  .  hydroxychloroquine (PLAQUENIL) 200 MG tablet, Take by mouth., Disp: , Rfl:  .  modafinil (PROVIGIL) 200 MG tablet, Take 400 mg by mouth in the morning., Disp: , Rfl:  .  oxyCODONE (OXY IR/ROXICODONE) 5 MG immediate release tablet, Take 5-10 mg by mouth at bedtime as needed for moderate pain. , Disp: , Rfl: 0 .  pantoprazole (PROTONIX) 40 MG tablet, Take 40 mg by mouth 2 (two) times daily., Disp: , Rfl:  .  rosuvastatin (CRESTOR) 5 MG tablet, TAKE 1 TABLET BY MOUTH EVERY DAY, Disp: 90 tablet, Rfl: 1 .  sildenafil (REVATIO) 20 MG tablet, TAKE 4 - 5 TABLETS BY MOUTH DAILY AS NEEDED, Disp: , Rfl: 3 .  sucralfate (CARAFATE) 1 g tablet, Take 1 g by mouth daily. , Disp: , Rfl: 1 .  tamsulosin (FLOMAX) 0.4 MG CAPS capsule, Take 0.4 mg by mouth daily., Disp: , Rfl:  .  tiZANidine (ZANAFLEX) 4 MG tablet, TAKE 1 - 2 TABLETS BY MOUTH THREE TIMES DAILY AS NEEDED, Disp: , Rfl:  .  amLODipine (NORVASC) 5 MG tablet, Take 1 tablet (5 mg total) by mouth 2 (two) times daily., Disp: 180 tablet, Rfl: 1  EXAM:  GENERAL: alert, oriented, appears well and in no acute distress  HEENT: atraumatic, conjunttiva clear, no obvious abnormalities on inspection of external nose and ears  NECK: normal movements of the head and neck  LUNGS: on inspection no signs of respiratory distress, breathing rate appears normal, no obvious gross SOB, gasping or wheezing  CV: no obvious cyanosis  PSYCH/NEURO: pleasant and cooperative, no obvious depression or anxiety, speech and thought processing grossly intact  ASSESSMENT AND PLAN:  Discussed the following assessment and plan:  Problem List Items Addressed This Visit    Hypertension    Continue amlodipine.  Follow pressures.        Relevant Medications   amLODipine (NORVASC) 5 MG tablet   Mild depression  (HCC)    Increased stress and depression as outlined.  Sleeping issues.  Back on gabapentin now.  Sleeping better.  Feeling better.  Discussed psychiatry referral.  Follow.        Relevant Orders   Ambulatory referral to Psychiatry   Rheumatoid arthritis ([place])    Recently prescribed hydroxychloroquine.  Plans to start.        Relevant Medications   hydroxychloroquine (PLAQUENIL) 200 MG tablet       I discussed the assessment and treatment plan with the patient. The patient was provided an opportunity to ask questions and all were answered. The patient agreed with the plan and demonstrated an  understanding of the instructions.   The patient was advised to call back or seek an in-person evaluation if the symptoms worsen or if the condition fails to improve as anticipated.   Einar Pheasant, MD

## 2020-06-07 ENCOUNTER — Encounter: Payer: Self-pay | Admitting: Internal Medicine

## 2020-06-07 DIAGNOSIS — M069 Rheumatoid arthritis, unspecified: Secondary | ICD-10-CM | POA: Insufficient documentation

## 2020-06-07 MED ORDER — AMLODIPINE BESYLATE 5 MG PO TABS: 1.0000 | ORAL_TABLET | Freq: Two times a day (BID) | ORAL | 1 refills | Status: DC

## 2020-06-07 NOTE — Assessment & Plan Note (Signed)
Continue amlodipine.  Follow pressures.   

## 2020-06-07 NOTE — Assessment & Plan Note (Signed)
Increased stress and depression as outlined.  Sleeping issues.  Back on gabapentin now.  Sleeping better.  Feeling better.  Discussed psychiatry referral.  Follow.

## 2020-06-07 NOTE — Addendum Note (Signed)
Addended by: Alisa Graff on: 06/07/2020 01:19 AM   Modules accepted: Orders

## 2020-06-07 NOTE — Assessment & Plan Note (Signed)
Recently prescribed hydroxychloroquine.  Plans to start.

## 2020-06-16 ENCOUNTER — Institutional Professional Consult (permissible substitution): Payer: Medicare HMO | Admitting: Adult Health

## 2020-06-17 DIAGNOSIS — I10 Essential (primary) hypertension: Secondary | ICD-10-CM | POA: Diagnosis not present

## 2020-06-17 DIAGNOSIS — E7800 Pure hypercholesterolemia, unspecified: Secondary | ICD-10-CM | POA: Diagnosis not present

## 2020-06-17 DIAGNOSIS — Z9889 Other specified postprocedural states: Secondary | ICD-10-CM | POA: Diagnosis not present

## 2020-06-17 DIAGNOSIS — R002 Palpitations: Secondary | ICD-10-CM | POA: Diagnosis not present

## 2020-06-17 DIAGNOSIS — R55 Syncope and collapse: Secondary | ICD-10-CM | POA: Diagnosis not present

## 2020-06-17 DIAGNOSIS — R000 Tachycardia, unspecified: Secondary | ICD-10-CM | POA: Diagnosis not present

## 2020-06-18 ENCOUNTER — Encounter: Payer: Self-pay | Admitting: Internal Medicine

## 2020-06-18 DIAGNOSIS — L723 Sebaceous cyst: Secondary | ICD-10-CM

## 2020-06-18 NOTE — Telephone Encounter (Signed)
Pt requested referral to surgery to have cyst removed 05/2020. Just following up.

## 2020-06-19 NOTE — Telephone Encounter (Signed)
Order placed for surgery referral.  

## 2020-06-30 DIAGNOSIS — L720 Epidermal cyst: Secondary | ICD-10-CM | POA: Diagnosis not present

## 2020-07-02 DIAGNOSIS — L720 Epidermal cyst: Secondary | ICD-10-CM | POA: Diagnosis not present

## 2020-07-08 DIAGNOSIS — Z7982 Long term (current) use of aspirin: Secondary | ICD-10-CM | POA: Diagnosis not present

## 2020-07-08 DIAGNOSIS — R202 Paresthesia of skin: Secondary | ICD-10-CM | POA: Diagnosis not present

## 2020-07-08 DIAGNOSIS — G8929 Other chronic pain: Secondary | ICD-10-CM | POA: Diagnosis not present

## 2020-07-08 DIAGNOSIS — M5030 Other cervical disc degeneration, unspecified cervical region: Secondary | ICD-10-CM | POA: Diagnosis not present

## 2020-07-08 DIAGNOSIS — M5136 Other intervertebral disc degeneration, lumbar region: Secondary | ICD-10-CM | POA: Diagnosis not present

## 2020-07-08 DIAGNOSIS — M79602 Pain in left arm: Secondary | ICD-10-CM | POA: Diagnosis not present

## 2020-07-08 DIAGNOSIS — Z79899 Other long term (current) drug therapy: Secondary | ICD-10-CM | POA: Diagnosis not present

## 2020-07-08 DIAGNOSIS — G894 Chronic pain syndrome: Secondary | ICD-10-CM | POA: Diagnosis not present

## 2020-07-08 DIAGNOSIS — M7918 Myalgia, other site: Secondary | ICD-10-CM | POA: Diagnosis not present

## 2020-07-08 DIAGNOSIS — M545 Low back pain, unspecified: Secondary | ICD-10-CM | POA: Diagnosis not present

## 2020-07-08 DIAGNOSIS — K861 Other chronic pancreatitis: Secondary | ICD-10-CM | POA: Diagnosis not present

## 2020-07-17 DIAGNOSIS — M059 Rheumatoid arthritis with rheumatoid factor, unspecified: Secondary | ICD-10-CM | POA: Diagnosis not present

## 2020-07-17 DIAGNOSIS — Z79899 Other long term (current) drug therapy: Secondary | ICD-10-CM | POA: Diagnosis not present

## 2020-07-17 DIAGNOSIS — K861 Other chronic pancreatitis: Secondary | ICD-10-CM | POA: Diagnosis not present

## 2020-07-17 DIAGNOSIS — Z1159 Encounter for screening for other viral diseases: Secondary | ICD-10-CM | POA: Diagnosis not present

## 2020-07-17 DIAGNOSIS — H04123 Dry eye syndrome of bilateral lacrimal glands: Secondary | ICD-10-CM | POA: Diagnosis not present

## 2020-07-23 ENCOUNTER — Ambulatory Visit: Payer: Medicare HMO

## 2020-08-02 DIAGNOSIS — Z23 Encounter for immunization: Secondary | ICD-10-CM | POA: Diagnosis not present

## 2020-09-02 DIAGNOSIS — M79602 Pain in left arm: Secondary | ICD-10-CM | POA: Diagnosis not present

## 2020-09-02 DIAGNOSIS — R202 Paresthesia of skin: Secondary | ICD-10-CM | POA: Diagnosis not present

## 2020-09-02 DIAGNOSIS — Z79899 Other long term (current) drug therapy: Secondary | ICD-10-CM | POA: Diagnosis not present

## 2020-09-02 DIAGNOSIS — M25522 Pain in left elbow: Secondary | ICD-10-CM | POA: Diagnosis not present

## 2020-09-02 DIAGNOSIS — G894 Chronic pain syndrome: Secondary | ICD-10-CM | POA: Diagnosis not present

## 2020-09-02 DIAGNOSIS — Z8719 Personal history of other diseases of the digestive system: Secondary | ICD-10-CM | POA: Diagnosis not present

## 2020-09-02 DIAGNOSIS — M5136 Other intervertebral disc degeneration, lumbar region: Secondary | ICD-10-CM | POA: Diagnosis not present

## 2020-09-02 DIAGNOSIS — G8929 Other chronic pain: Secondary | ICD-10-CM | POA: Diagnosis not present

## 2020-09-02 DIAGNOSIS — M546 Pain in thoracic spine: Secondary | ICD-10-CM | POA: Diagnosis not present

## 2020-09-02 DIAGNOSIS — M7918 Myalgia, other site: Secondary | ICD-10-CM | POA: Diagnosis not present

## 2020-09-02 DIAGNOSIS — M25569 Pain in unspecified knee: Secondary | ICD-10-CM | POA: Diagnosis not present

## 2020-09-02 DIAGNOSIS — M5030 Other cervical disc degeneration, unspecified cervical region: Secondary | ICD-10-CM | POA: Diagnosis not present

## 2020-09-02 DIAGNOSIS — M25519 Pain in unspecified shoulder: Secondary | ICD-10-CM | POA: Diagnosis not present

## 2020-09-07 ENCOUNTER — Other Ambulatory Visit: Payer: Self-pay | Admitting: Internal Medicine

## 2020-09-07 ENCOUNTER — Other Ambulatory Visit: Payer: Self-pay

## 2020-09-07 ENCOUNTER — Ambulatory Visit (INDEPENDENT_AMBULATORY_CARE_PROVIDER_SITE_OTHER): Payer: Medicare HMO | Admitting: Internal Medicine

## 2020-09-07 VITALS — BP 130/80 | HR 78 | Temp 97.8°F | Resp 16 | Ht 74.0 in | Wt 210.0 lb

## 2020-09-07 DIAGNOSIS — Z7185 Encounter for immunization safety counseling: Secondary | ICD-10-CM

## 2020-09-07 DIAGNOSIS — Z125 Encounter for screening for malignant neoplasm of prostate: Secondary | ICD-10-CM | POA: Diagnosis not present

## 2020-09-07 DIAGNOSIS — M069 Rheumatoid arthritis, unspecified: Secondary | ICD-10-CM

## 2020-09-07 DIAGNOSIS — G8929 Other chronic pain: Secondary | ICD-10-CM

## 2020-09-07 DIAGNOSIS — I77819 Aortic ectasia, unspecified site: Secondary | ICD-10-CM | POA: Diagnosis not present

## 2020-09-07 DIAGNOSIS — R5383 Other fatigue: Secondary | ICD-10-CM | POA: Diagnosis not present

## 2020-09-07 DIAGNOSIS — E7800 Pure hypercholesterolemia, unspecified: Secondary | ICD-10-CM | POA: Diagnosis not present

## 2020-09-07 DIAGNOSIS — I10 Essential (primary) hypertension: Secondary | ICD-10-CM | POA: Diagnosis not present

## 2020-09-07 DIAGNOSIS — Z1211 Encounter for screening for malignant neoplasm of colon: Secondary | ICD-10-CM

## 2020-09-07 DIAGNOSIS — F320 Major depressive disorder, single episode, mild: Secondary | ICD-10-CM

## 2020-09-07 DIAGNOSIS — Z8719 Personal history of other diseases of the digestive system: Secondary | ICD-10-CM | POA: Diagnosis not present

## 2020-09-07 DIAGNOSIS — Z23 Encounter for immunization: Secondary | ICD-10-CM | POA: Diagnosis not present

## 2020-09-07 DIAGNOSIS — M545 Low back pain, unspecified: Secondary | ICD-10-CM

## 2020-09-07 DIAGNOSIS — R7989 Other specified abnormal findings of blood chemistry: Secondary | ICD-10-CM

## 2020-09-07 DIAGNOSIS — D649 Anemia, unspecified: Secondary | ICD-10-CM

## 2020-09-07 DIAGNOSIS — R945 Abnormal results of liver function studies: Secondary | ICD-10-CM

## 2020-09-07 DIAGNOSIS — F32A Depression, unspecified: Secondary | ICD-10-CM

## 2020-09-07 DIAGNOSIS — R69 Illness, unspecified: Secondary | ICD-10-CM | POA: Diagnosis not present

## 2020-09-07 LAB — BASIC METABOLIC PANEL
BUN: 13 mg/dL (ref 6–23)
CO2: 28 mEq/L (ref 19–32)
Calcium: 9.6 mg/dL (ref 8.4–10.5)
Chloride: 101 mEq/L (ref 96–112)
Creatinine, Ser: 0.87 mg/dL (ref 0.40–1.50)
GFR: 95.42 mL/min (ref >60.00)
Glucose, Bld: 90 mg/dL (ref 70–99)
Potassium: 5 mEq/L (ref 3.5–5.1)
Sodium: 137 mEq/L (ref 135–145)

## 2020-09-07 LAB — LIPID PANEL
Cholesterol: 113 mg/dL (ref 0–200)
HDL: 49.3 mg/dL (ref >39.00)
LDL Cholesterol: 44 mg/dL (ref 0–99)
NonHDL: 63.45
Total CHOL/HDL Ratio: 2
Triglycerides: 95 mg/dL (ref 0.0–149.0)
VLDL: 19 mg/dL (ref 0.0–40.0)

## 2020-09-07 LAB — HEPATIC FUNCTION PANEL
ALT: 27 U/L (ref 0–53)
AST: 24 U/L (ref 0–37)
Albumin: 4.4 g/dL (ref 3.5–5.2)
Alkaline Phosphatase: 153 U/L — ABNORMAL HIGH (ref 39–117)
Bilirubin, Direct: 0.1 mg/dL (ref 0.0–0.3)
Total Bilirubin: 0.4 mg/dL (ref 0.2–1.2)
Total Protein: 7.8 g/dL (ref 6.0–8.3)

## 2020-09-07 LAB — PSA, MEDICARE: PSA: 0.63 ng/ml (ref 0.10–4.00)

## 2020-09-07 LAB — TSH: TSH: 0.29 u[IU]/mL — ABNORMAL LOW (ref 0.35–5.50)

## 2020-09-07 LAB — AMYLASE: Amylase: 44 U/L (ref 27–131)

## 2020-09-07 LAB — LIPASE: Lipase: 45 U/L (ref 11.0–59.0)

## 2020-09-07 NOTE — Progress Notes (Signed)
Order placed for add on labs.   °

## 2020-09-07 NOTE — Progress Notes (Signed)
Patient ID: [name], male   DOB: 1960/10/07, 60 y.o.   MRN: [id number]   Subjective:    Patient ID: [name], male    DOB: October 14, 1960, 60 y.o.   MRN: [id number]  HPI This visit occurred during the SARS-CoV-2 public health emergency.  Safety protocols were in place, including screening questions prior to the visit, additional usage of staff PPE, and extensive cleaning of exam room while observing appropriate contact time as indicated for disinfecting solutions.   Patient here for a scheduled follow up.   Here to follow up regarding blood pressure and increased stress.  Recently diagnosed with RA.  Started on humira.  Blood pressure has been doing better.  Feels better.  Sleeping better.  No chest pain or sob reported.  No abdominal pain.  Bowels moving.  Handling stress.  Appears to be doing better.  Discussed vaccines - pneumonia vaccine.  Some fatigue.     Past Medical History:  Diagnosis Date   Allergy    Anoxic brain injury ([place])    Chronic back pain    s/p 5 back surgeries   Coronary artery disease    Cardiac catheterization in 2007 at [facility] showed 30% stenosis in proximal LAD. No other disease. Ejection fraction was 65%   Depression    H/O acute pancreatitis    Hypercholesterolemia    Mild cognitive impairment with memory loss    Pancreatitis    Past Surgical History:  Procedure Laterality Date   abdomial surgery     BACK SURGERY     multiple back surgeries (5)   CARDIAC CATHETERIZATION     [facility]    CHOLECYSTECTOMY  2013   COLONOSCOPY WITH PROPOFOL N/A 06/30/2015   Procedure: COLONOSCOPY WITH PROPOFOL;  Surgeon: Lollie Sails, MD;  Location[facility] ENDOSCOPY;  Service: Endoscopy;  Laterality: N/A;   ESOPHAGOGASTRODUODENOSCOPY (EGD) WITH PROPOFOL N/A 03/30/2016   Procedure: ESOPHAGOGASTRODUODENOSCOPY (EGD) WITH PROPOFOL;  Surgeon: Manya Silvas, MD;  Location: [facility] ENDOSCOPY;  Service: Endoscopy;  Laterality: N/A;   FUNCTIONAL ENDOSCOPIC SINUS SURGERY     JOINT  REPLACEMENT     LAMINECTOMY     LUMBAR SPINE SURGERY     SHOULDER ARTHROSCOPY WITH ROTATOR CUFF REPAIR AND OPEN BICEPS TENODESIS Right    SHOULDER ARTHROSCOPY WITH ROTATOR CUFF REPAIR AND OPEN BICEPS TENODESIS     SPINE SURGERY     TRIGGER POINT INJECTION     VASECTOMY     With Reversal   VEIN SURGERY Right 2013   arm   Family History  Problem Relation Age of Onset   Heart disease Father    Heart attack Father    Heart disease Mother    Heart attack Mother    Heart attack Brother        MI's x 2    Hypertension Other    Colon cancer Other    Hypertension Sister    Heart attack Paternal Grandfather    Hypertension Sister    Social History   Socioeconomic History   Marital status: Divorced    Spouse name: Not on file   Number of children: 3   Years of education: Not on file   Highest education level: Not on file  Occupational History   Not on file  Tobacco Use   Smoking status: Never   Smokeless tobacco: Never  Vaping Use   Vaping Use: Never used  Substance and Sexual Activity   Alcohol use: No    Alcohol/week: 0.0  standard drinks   Drug use: No   Sexual activity: Yes  Other Topics Concern   Not on file  Social History Narrative   Not on file   Social Determinants of Health   Financial Resource Strain: Not on file  Food Insecurity: Not on file  Transportation Needs: Not on file  Physical Activity: Not on file  Stress: Not on file  Social Connections: Not on file    Review of Systems  Constitutional:  Negative for appetite change and unexpected weight change.  HENT:  Negative for congestion and sinus pressure.   Respiratory:  Negative for cough, chest tightness and shortness of breath.   Cardiovascular:  Negative for chest pain, palpitations and leg swelling.  Gastrointestinal:  Negative for abdominal pain, diarrhea, nausea and vomiting.  Genitourinary:  Negative for difficulty urinating and dysuria.  Musculoskeletal:  Positive for back pain. Negative  for joint swelling and myalgias.       Chronic back pain.   Skin:  Negative for color change and rash.  Neurological:  Negative for dizziness, light-headedness and headaches.  Psychiatric/Behavioral:  Negative for agitation and dysphoric mood.       Objective:    Physical Exam Vitals reviewed.  Constitutional:      General: He is not in acute distress.    Appearance: Normal appearance. He is well-developed.  HENT:     Head: Normocephalic and atraumatic.     Right Ear: External ear normal.     Left Ear: External ear normal.  Eyes:     General: No scleral icterus.       Right eye: No discharge.        Left eye: No discharge.     Conjunctiva/sclera: Conjunctivae normal.  Cardiovascular:     Rate and Rhythm: Normal rate and regular rhythm.  Pulmonary:     Effort: Pulmonary effort is normal. No respiratory distress.     Breath sounds: Normal breath sounds.  Abdominal:     General: Bowel sounds are normal.     Palpations: Abdomen is soft.     Tenderness: There is no abdominal tenderness.  Musculoskeletal:        General: No swelling or tenderness.     Cervical back: Neck supple. No tenderness.  Lymphadenopathy:     Cervical: No cervical adenopathy.  Skin:    Findings: No erythema or rash.  Neurological:     Mental Status: He is alert.  Psychiatric:        Mood and Affect: Mood normal.        Behavior: Behavior normal.    BP 130/80   Pulse 78   Temp 97.8 F (36.6 C)   Resp 16   Ht '6\' 2"'$  (1.88 m)   Wt 210 lb (95.3 kg)   SpO2 99%   BMI 26.96 kg/m  Wt Readings from Last 3 Encounters:  09/07/20 210 lb (95.3 kg)  06/01/20 210 lb (95.3 kg)  05/07/20 211 lb (95.7 kg)    Outpatient Encounter Medications as of 09/07/2020  Medication Sig   Adalimumab 40 MG/0.4ML PNKT Inject 40 mg into the skin every 14 (fourteen) days.   amLODipine (NORVASC) 5 MG tablet Take 1 tablet (5 mg total) by mouth 2 (two) times daily.   aspirin 81 MG chewable tablet Chew 81 mg by mouth daily as  needed for moderate pain.    atomoxetine (STRATTERA) 100 MG capsule Take 100 mg by mouth daily.   fluticasone (FLONASE) 50 MCG/ACT nasal spray SPRAY  2 SPRAYS INTO EACH NOSTRIL EVERY DAY   gabapentin (NEURONTIN) 300 MG capsule Take 1 or 2 capsules by mouth TID   hydroxychloroquine (PLAQUENIL) 200 MG tablet Take by mouth.   modafinil (PROVIGIL) 200 MG tablet Take 400 mg by mouth in the morning.   oxyCODONE (OXY IR/ROXICODONE) 5 MG immediate release tablet Take 5-10 mg by mouth at bedtime as needed for moderate pain.    pantoprazole (PROTONIX) 40 MG tablet Take 40 mg by mouth 2 (two) times daily.   rosuvastatin (CRESTOR) 5 MG tablet TAKE 1 TABLET BY MOUTH EVERY DAY   sildenafil (REVATIO) 20 MG tablet TAKE 4 - 5 TABLETS BY MOUTH DAILY AS NEEDED   sucralfate (CARAFATE) 1 g tablet Take 1 g by mouth daily.    tamsulosin (FLOMAX) 0.4 MG CAPS capsule Take 0.4 mg by mouth daily.   tiZANidine (ZANAFLEX) 4 MG tablet TAKE 1 - 2 TABLETS BY MOUTH THREE TIMES DAILY AS NEEDED   No facility-administered encounter medications on file as of 09/07/2020.     Lab Results  Component Value Date   WBC 6.7 05/07/2020   HGB 13.7 05/07/2020   HCT 39.7 05/07/2020   PLT 354.0 05/07/2020   GLUCOSE 90 09/07/2020   CHOL 113 09/07/2020   TRIG 95.0 09/07/2020   HDL 49.30 09/07/2020   LDLCALC 44 09/07/2020   ALT 27 09/07/2020   AST 24 09/07/2020   NA 137 09/07/2020   K 5.0 09/07/2020   CL 101 09/07/2020   CREATININE 0.87 09/07/2020   BUN 13 09/07/2020   CO2 28 09/07/2020   TSH 0.29 (L) 09/07/2020   PSA 0.63 09/07/2020   INR 1.0 09/24/2019   HGBA1C 5.6 09/23/2019    US Venous Img Lower Unilateral Right  Result Date: 01/06/2020 CLINICAL DATA:  60 year old male with right lower extremity swelling for 1 week. EXAM: RIGHT LOWER EXTREMITY VENOUS DOPPLER ULTRASOUND TECHNIQUE: Gray-scale sonography with graded compression, as well as color Doppler and duplex ultrasound were performed to evaluate the right lower  extremity deep venous systems from the level of the common femoral vein and including the common femoral, femoral, profunda femoral, popliteal and calf veins including the posterior tibial, peroneal and gastrocnemius veins when visible. Spectral Doppler was utilized to evaluate flow at rest and with distal augmentation maneuvers in the common femoral, femoral and popliteal veins. The contralateral common femoral vein was also evaluated for comparison. COMPARISON:  None. FINDINGS: RIGHT LOWER EXTREMITY Common Femoral Vein: No evidence of thrombus. Normal compressibility, respiratory phasicity and response to augmentation. Central Greater Saphenous Vein: No evidence of thrombus. Normal compressibility and flow on color Doppler imaging. Central Profunda Femoral Vein: No evidence of thrombus. Normal compressibility and flow on color Doppler imaging. Femoral Vein: No evidence of thrombus. Normal compressibility, respiratory phasicity and response to augmentation. Popliteal Vein: No evidence of thrombus. Normal compressibility, respiratory phasicity and response to augmentation. Calf Veins: No evidence of thrombus. Normal compressibility and flow on color Doppler imaging. Other Findings: Irregularly-shaped simple fluid collection subcutaneous tissues about the superior aspect of the anterior right knee measuring approximately 6.4 x 1.6 x 5.6 cm. On this limited sonographic evaluation, it is uncertain if this collection is within the joint capsule. LEFT LOWER EXTREMITY Common Femoral Vein: No evidence of thrombus. Normal compressibility, respiratory phasicity and response to augmentation. IMPRESSION: 1. No evidence of right lower extremity deep venous thrombosis. 2. Indeterminate simple fluid collection in the anterior aspect of the superior right knee as could be seen with simple subcutaneous fluid  collection versus joint effusion. Recommend correlation with physical examination and consider knee radiographs as clinically  indicated. Ruthann Cancer, MD [facility] Radiology Electronically Signed   By: Ruthann Cancer MD   On: 01/06/2020 11:10       Assessment & Plan:   Problem List Items Addressed This Visit     Abnormal liver function tests    Found to have elevated alkaline phos.  Evaluated by GI.  Recheck liver panel.        Anemia    Follow cbc.       Aortic dilatation (HCC)    Followed by cardiology.       Chronic back pain    Followed by pain clinic/[facility].  Stable.       Relevant Medications   Adalimumab 40 MG/0.4ML PNKT   Fatigue    Some fatigue.  Started humira for RA treatment.  Overall appears to be feeling better.  Follow.  Check routine labs.       History of pancreatitis    No pain.  Eating.  No nausea or vomiting.  Request to have amylase and lipase checked.  Follow.       Relevant Orders   Amylase (Completed)   Lipase (Completed)   Hypercholesteremia    Low cholesterol diet and exercise.  Continue crestor.  Check lipid panel and liver function tests.        Relevant Orders   Lipid panel (Completed)   Hepatic function panel (Completed)   TSH   T3, free   T4, free   Hypertension - Primary    Continue amlodipine.  Follow pressures. Blood pressure as outlined. No changes.        Relevant Orders   Basic metabolic panel (Completed)   TSH (Completed)   TSH   T3, free   T4, free   Basic Metabolic Panel (BMET)   Immunization counseling    Discussed pneumonia vaccine.  prevnar 20 given today.       Mild depression (HCC)    Feeling better.  Follow.       Rheumatoid arthritis [facility])    Being followed by rheumatology.  Started on humira.        Relevant Medications   Adalimumab 40 MG/0.4ML PNKT   Other Visit Diagnoses     Prostate cancer screening       Relevant Orders   PSA, Medicare ( [place] only) (Completed)   Need for vaccination       Relevant Orders   Pneumococcal conjugate vaccine 20-valent  (Prevnar 20) (Completed)   Colon cancer screening       Relevant Orders   Ambulatory referral to Gastroenterology        Einar Pheasant, MD

## 2020-09-08 ENCOUNTER — Other Ambulatory Visit (INDEPENDENT_AMBULATORY_CARE_PROVIDER_SITE_OTHER): Payer: Medicare HMO

## 2020-09-08 DIAGNOSIS — R7989 Other specified abnormal findings of blood chemistry: Secondary | ICD-10-CM

## 2020-09-08 LAB — T3, FREE: T3, Free: 4 pg/mL (ref 2.3–4.2)

## 2020-09-08 LAB — T4, FREE: Free T4: 0.95 ng/dL (ref 0.60–1.60)

## 2020-09-10 ENCOUNTER — Other Ambulatory Visit: Payer: Self-pay | Admitting: Internal Medicine

## 2020-09-10 DIAGNOSIS — E875 Hyperkalemia: Secondary | ICD-10-CM

## 2020-09-10 NOTE — Progress Notes (Signed)
Order placed for f/u potassium check.  

## 2020-09-14 ENCOUNTER — Encounter: Payer: Self-pay | Admitting: Internal Medicine

## 2020-09-14 DIAGNOSIS — Z7185 Encounter for immunization safety counseling: Secondary | ICD-10-CM | POA: Insufficient documentation

## 2020-09-14 NOTE — Assessment & Plan Note (Signed)
Found to have elevated alkaline phos.  Evaluated by GI.  Recheck liver panel.

## 2020-09-14 NOTE — Assessment & Plan Note (Signed)
Followed by pain clinic/[facility].  Stable.  

## 2020-09-14 NOTE — Assessment & Plan Note (Signed)
Low cholesterol diet and exercise.  Continue crestor.  Check lipid panel and liver function tests.   

## 2020-09-14 NOTE — Assessment & Plan Note (Signed)
Some fatigue.  Started humira for RA treatment.  Overall appears to be feeling better.  Follow.  Check routine labs.

## 2020-09-14 NOTE — Assessment & Plan Note (Signed)
Feeling better.  Follow.

## 2020-09-14 NOTE — Assessment & Plan Note (Signed)
Continue amlodipine.  Follow pressures. Blood pressure as outlined. No changes.

## 2020-09-14 NOTE — Assessment & Plan Note (Signed)
Being followed by rheumatology.  Started on humira.

## 2020-09-14 NOTE — Assessment & Plan Note (Signed)
Follow cbc.  

## 2020-09-14 NOTE — Assessment & Plan Note (Signed)
Followed by cardiology 

## 2020-09-14 NOTE — Assessment & Plan Note (Signed)
No pain.  Eating.  No nausea or vomiting.  Request to have amylase and lipase checked.  Follow.

## 2020-09-14 NOTE — Assessment & Plan Note (Signed)
Discussed pneumonia vaccine.  prevnar 20 given today.

## 2020-09-16 ENCOUNTER — Other Ambulatory Visit: Payer: Self-pay

## 2020-09-16 ENCOUNTER — Other Ambulatory Visit (INDEPENDENT_AMBULATORY_CARE_PROVIDER_SITE_OTHER): Payer: Medicare HMO

## 2020-09-16 DIAGNOSIS — I10 Essential (primary) hypertension: Secondary | ICD-10-CM | POA: Diagnosis not present

## 2020-09-16 LAB — BASIC METABOLIC PANEL
BUN: 14 mg/dL (ref 6–23)
CO2: 27 mEq/L (ref 19–32)
Calcium: 9.5 mg/dL (ref 8.4–10.5)
Chloride: 102 mEq/L (ref 96–112)
Creatinine, Ser: 1 mg/dL (ref 0.40–1.50)
GFR: 83.22 mL/min (ref >60.00)
Glucose, Bld: 87 mg/dL (ref 70–99)
Potassium: 4.1 mEq/L (ref 3.5–5.1)
Sodium: 138 mEq/L (ref 135–145)

## 2020-09-30 DIAGNOSIS — M5136 Other intervertebral disc degeneration, lumbar region: Secondary | ICD-10-CM | POA: Diagnosis not present

## 2020-09-30 DIAGNOSIS — M7918 Myalgia, other site: Secondary | ICD-10-CM | POA: Diagnosis not present

## 2020-09-30 DIAGNOSIS — M5030 Other cervical disc degeneration, unspecified cervical region: Secondary | ICD-10-CM | POA: Diagnosis not present

## 2020-09-30 DIAGNOSIS — G894 Chronic pain syndrome: Secondary | ICD-10-CM | POA: Diagnosis not present

## 2020-09-30 DIAGNOSIS — G8929 Other chronic pain: Secondary | ICD-10-CM | POA: Diagnosis not present

## 2020-10-13 ENCOUNTER — Other Ambulatory Visit: Payer: Self-pay | Admitting: Internal Medicine

## 2020-10-13 DIAGNOSIS — Z23 Encounter for immunization: Secondary | ICD-10-CM | POA: Diagnosis not present

## 2020-10-14 ENCOUNTER — Other Ambulatory Visit (INDEPENDENT_AMBULATORY_CARE_PROVIDER_SITE_OTHER): Payer: Medicare HMO

## 2020-10-14 ENCOUNTER — Other Ambulatory Visit: Payer: Self-pay

## 2020-10-14 DIAGNOSIS — E7800 Pure hypercholesterolemia, unspecified: Secondary | ICD-10-CM | POA: Diagnosis not present

## 2020-10-14 DIAGNOSIS — I10 Essential (primary) hypertension: Secondary | ICD-10-CM

## 2020-10-14 LAB — TSH: TSH: 2.11 u[IU]/mL (ref 0.35–5.50)

## 2020-10-14 LAB — T4, FREE: Free T4: 0.78 ng/dL (ref 0.60–1.60)

## 2020-10-14 LAB — T3, FREE: T3, Free: 3.5 pg/mL (ref 2.3–4.2)

## 2020-10-23 DIAGNOSIS — L270 Generalized skin eruption due to drugs and medicaments taken internally: Secondary | ICD-10-CM | POA: Diagnosis not present

## 2020-10-23 DIAGNOSIS — M059 Rheumatoid arthritis with rheumatoid factor, unspecified: Secondary | ICD-10-CM | POA: Diagnosis not present

## 2020-10-28 ENCOUNTER — Ambulatory Visit: Payer: Medicare HMO

## 2020-10-28 DIAGNOSIS — G8929 Other chronic pain: Secondary | ICD-10-CM | POA: Diagnosis not present

## 2020-10-28 DIAGNOSIS — M5030 Other cervical disc degeneration, unspecified cervical region: Secondary | ICD-10-CM | POA: Diagnosis not present

## 2020-10-28 DIAGNOSIS — M7918 Myalgia, other site: Secondary | ICD-10-CM | POA: Diagnosis not present

## 2020-10-28 DIAGNOSIS — G894 Chronic pain syndrome: Secondary | ICD-10-CM | POA: Diagnosis not present

## 2020-10-28 DIAGNOSIS — M5136 Other intervertebral disc degeneration, lumbar region: Secondary | ICD-10-CM | POA: Diagnosis not present

## 2020-11-03 ENCOUNTER — Encounter: Payer: Self-pay | Admitting: Internal Medicine

## 2020-11-04 NOTE — Telephone Encounter (Signed)
I do not mind refilling, but need to know why he needs.  Is he having ongoing issues or is it something acute?

## 2020-11-05 ENCOUNTER — Other Ambulatory Visit: Payer: Self-pay

## 2020-11-05 MED ORDER — TRIAMCINOLONE ACETONIDE 0.1 % EX CREA: 1.0000 "application " | TOPICAL_CREAM | Freq: Two times a day (BID) | CUTANEOUS | 0 refills | Status: DC | PRN

## 2020-11-05 NOTE — Telephone Encounter (Signed)
Confirmed nothing acute. He uses prn and is out. Last refill was a few years ago and he is out now. Refill sent in. Pt is aware

## 2020-11-11 DIAGNOSIS — M7918 Myalgia, other site: Secondary | ICD-10-CM | POA: Diagnosis not present

## 2020-11-11 DIAGNOSIS — G8929 Other chronic pain: Secondary | ICD-10-CM | POA: Diagnosis not present

## 2020-11-16 ENCOUNTER — Encounter: Payer: Self-pay | Admitting: Internal Medicine

## 2020-11-16 NOTE — Telephone Encounter (Signed)
noted 

## 2020-11-18 DIAGNOSIS — L820 Inflamed seborrheic keratosis: Secondary | ICD-10-CM | POA: Diagnosis not present

## 2020-11-18 DIAGNOSIS — L309 Dermatitis, unspecified: Secondary | ICD-10-CM | POA: Diagnosis not present

## 2020-11-18 DIAGNOSIS — D239 Other benign neoplasm of skin, unspecified: Secondary | ICD-10-CM | POA: Diagnosis not present

## 2020-12-02 ENCOUNTER — Encounter: Payer: Self-pay | Admitting: Internal Medicine

## 2020-12-02 DIAGNOSIS — Z23 Encounter for immunization: Secondary | ICD-10-CM | POA: Diagnosis not present

## 2020-12-02 NOTE — Telephone Encounter (Signed)
The likelihood of prior exposure to HPV vaccine types increases with age, and thus the population benefit and cost-effectiveness of HPV vaccination is lower among older patients.  In the [place], HPV vaccine is approved through age 58.

## 2020-12-08 ENCOUNTER — Encounter: Payer: Self-pay | Admitting: Internal Medicine

## 2020-12-08 ENCOUNTER — Ambulatory Visit
Admission: EM | Admit: 2020-12-08 | Discharge: 2020-12-08 | Disposition: A | Discharge status: Home / Self Care | Payer: Medicare HMO | Attending: Internal Medicine | Admitting: Internal Medicine

## 2020-12-08 ENCOUNTER — Telehealth: Payer: Self-pay

## 2020-12-08 ENCOUNTER — Encounter: Payer: Self-pay | Admitting: Emergency Medicine

## 2020-12-08 DIAGNOSIS — G44019 Episodic cluster headache, not intractable: Secondary | ICD-10-CM | POA: Diagnosis not present

## 2020-12-08 DIAGNOSIS — H6503 Acute serous otitis media, bilateral: Secondary | ICD-10-CM

## 2020-12-08 DIAGNOSIS — R21 Rash and other nonspecific skin eruption: Secondary | ICD-10-CM | POA: Diagnosis not present

## 2020-12-08 MED ORDER — FLUTICASONE PROPIONATE 50 MCG/ACT NA SUSP: 1.0000 | Freq: Two times a day (BID) | NASAL | 0 refills | Status: DC

## 2020-12-08 MED ORDER — DOXYCYCLINE HYCLATE 100 MG PO CAPS: 100.0000 mg | ORAL_CAPSULE | Freq: Two times a day (BID) | ORAL | 0 refills | Status: DC

## 2020-12-08 NOTE — Telephone Encounter (Signed)
FYI-  Called patient to get more information. Patient stated that he has had the rash for the past week that are like hives around his neck and spread up his face on his chin and the corners of his mouth. Along with the rash patient was having severe headaches that started about the same time and feels unsteady. Advised patient that given his symptoms he needs to be evaluated today to confirm no allergic reaction or something else acute going on and then f/u with PCP after to determine further work up. Patient agreed and stated he was actually sitting at [facility] right now but they had a 2 hour wait. Patient was given info to [facility] in [place]. Wait time 29 minutes. Stayed on phone with patient until he arrived at [facility]. Patient will f/u after.

## 2020-12-08 NOTE — Telephone Encounter (Signed)
Mychart message  Still having the rash. It has been around the neck for some time and now chin and corner of the lips. I'm trying to get in with dermatology at [facility] now. The cream has helped however it still comes back.  Also I have started these massive headaches everyday don't know what's that about also.  Give me your thoughts

## 2020-12-08 NOTE — ED Triage Notes (Signed)
Pt c/o of rash on chest, arms and face that has gotten worse the past 3 days.

## 2020-12-08 NOTE — Discharge Instructions (Signed)
Try the flonase for one week to see if the dizziness is just fluid in the middle ear, if so it will resolve. But if not and the headache persist, you will need to have more test done than what we can do here.

## 2020-12-08 NOTE — Telephone Encounter (Signed)
I thinking I'm having allergic reaction to something. The rash is still bad at times     Sims, Eddie L  P [facility] (supporting Einar Pheasant, MD) 3 hours ago (8:05 AM)   Still having the rash. It has been around the neck for some time and now chin and corner of the lips. I'm trying to get in with dermatology at [facility] now. The cream has helped however it still comes back.  Also I have started these massive headaches everyday don't know what's that about also.  Give me your thoughts

## 2020-12-08 NOTE — Telephone Encounter (Signed)
See more recent note.

## 2020-12-08 NOTE — ED Provider Notes (Signed)
Roderic Palau    CSN: [id number] Arrival date & time: 12/08/20  1233      History   Chief Complaint Chief Complaint  Patient presents with   Rash    HPI JAYSEN WEY is a 60 y.o. male who presents with a rash around lips,chin x 1 week. New spot 3 days ago. It Started with L forearm rash 3 months ago His RA Dr saw the rash and told him to see derm and has apt next week. Has been applying hydrogen peroxide. If he rubs the rash it starts bleeding.  2- daily HA on temples x 1 week, wore on L temple and pressing on temples help, and gets sonophobia and photophobia and felt off balance. Gets tingling on his L temple and face during the HA's. And Tylenol makes the HA  decrease from 9/10 to 7/10. Denies vision changes during the HA episodes. Has not fallen from being off balance. Gets light headed off and on.    Past Medical History:  Diagnosis Date   Allergy    Anoxic brain injury ([place])    Chronic back pain    s/p 5 back surgeries   Coronary artery disease    Cardiac catheterization in 2007 at [facility] showed 30% stenosis in proximal LAD. No other disease. Ejection fraction was 65%   Depression    H/O acute pancreatitis    Hypercholesterolemia    Mild cognitive impairment with memory loss    Pancreatitis     Patient Active Problem List   Diagnosis Date Noted   Immunization counseling 09/14/2020   Rheumatoid arthritis ([place]) 06/07/2020   Right leg swelling 01/06/2020   Aortic dilatation ([place]) 10/07/2019   Sinusitis 09/23/2019   Elbow pain 09/01/2019   Syncope 05/29/2019   Urinary hesitancy 01/20/2019   Medication monitoring encounter 10/01/2017   Abdominal pain 02/15/2016   Fatigue 02/15/2016   Chest pain 11/01/2015   Rash 11/01/2015   Diverticulosis of colon without hemorrhage 07/04/2015   Abnormal liver function tests 05/03/2015   Left knee pain 04/08/2015   Complete rotator cuff rupture of left shoulder 03/26/2015   Hypertension 03/01/2015   Fungal infection  of foot 10/18/2014   Health care maintenance 06/16/2014   History of pancreatitis 06/12/2014   Sinus tachycardia 06/27/2013   DOE (dyspnea on exertion) 06/04/2013   Sebaceous cyst 02/05/2013   Hyponatremia 11/04/2012   Short-term memory loss 05/06/2012   Anemia 05/06/2012   Neck fullness 03/27/2012   Chronic back pain 12/01/2011   Hypercholesteremia 12/01/2011   Mild depression 07/29/2011   Failed back syndrome of lumbar spine 07/22/2011    Past Surgical History:  Procedure Laterality Date   abdomial surgery     BACK SURGERY     multiple back surgeries (5)   CARDIAC CATHETERIZATION     [facility]    CHOLECYSTECTOMY  2013   COLONOSCOPY WITH PROPOFOL N/A 06/30/2015   Procedure: COLONOSCOPY WITH PROPOFOL;  Surgeon: Lollie Sails, MD;  Location: [facility] ENDOSCOPY;  Service: Endoscopy;  Laterality: N/A;   ESOPHAGOGASTRODUODENOSCOPY (EGD) WITH PROPOFOL N/A 03/30/2016   Procedure: ESOPHAGOGASTRODUODENOSCOPY (EGD) WITH PROPOFOL;  Surgeon: Manya Silvas, MD;  Location: [facility] ENDOSCOPY;  Service: Endoscopy;  Laterality: N/A;   FUNCTIONAL ENDOSCOPIC SINUS SURGERY     JOINT REPLACEMENT     LAMINECTOMY     LUMBAR SPINE SURGERY     SHOULDER ARTHROSCOPY WITH ROTATOR CUFF REPAIR AND OPEN BICEPS TENODESIS Right    SHOULDER ARTHROSCOPY WITH ROTATOR CUFF REPAIR AND OPEN  BICEPS TENODESIS     SPINE SURGERY     TRIGGER POINT INJECTION     VASECTOMY     With Reversal   VEIN SURGERY Right 2013   arm       Home Medications    Prior to Admission medications   Medication Sig Start Date End Date Taking? Authorizing Provider  doxycycline (VIBRAMYCIN) 100 MG capsule Take 1 capsule (100 mg total) by mouth 2 (two) times daily. 12/08/20  Yes Rodriguez-Southworth, Sunday Spillers, PA-C  fluticasone (FLONASE) 50 MCG/ACT nasal spray Place 1 spray into both nostrils 2 (two) times daily. 12/08/20  Yes Rodriguez-Southworth, Sunday Spillers, PA-C  Adalimumab 40 MG/0.4ML PNKT Inject 40 mg into the skin every 14 (fourteen) days.  07/17/20 07/17/21  [provider]  amLODipine (NORVASC) 5 MG tablet Take 1 tablet (5 mg total) by mouth 2 (two) times daily. 06/07/20   Einar Pheasant, MD  aspirin 81 MG chewable tablet Chew 81 mg by mouth daily as needed for moderate pain.     [provider]  atomoxetine (STRATTERA) 100 MG capsule Take 100 mg by mouth daily.    [provider]  gabapentin (NEURONTIN) 300 MG capsule Take 1 or 2 capsules by mouth TID    [provider]  hydroxychloroquine (PLAQUENIL) 200 MG tablet Take by mouth. 05/19/20 05/19/21  [provider]  modafinil (PROVIGIL) 200 MG tablet Take 400 mg by mouth in the morning.    [provider]  oxyCODONE (OXY IR/ROXICODONE) 5 MG immediate release tablet Take 5-10 mg by mouth at bedtime as needed for moderate pain.     [provider]  pantoprazole (PROTONIX) 40 MG tablet Take 40 mg by mouth 2 (two) times daily. 11/05/18   [provider]  rosuvastatin (CRESTOR) 5 MG tablet TAKE 1 TABLET BY MOUTH EVERY DAY 10/14/20   Einar Pheasant, MD  sildenafil (REVATIO) 20 MG tablet TAKE 4 - 5 TABLETS BY MOUTH DAILY AS NEEDED    [provider]  sucralfate (CARAFATE) 1 g tablet Take 1 g by mouth daily.     [provider]  tamsulosin (FLOMAX) 0.4 MG CAPS capsule Take 0.4 mg by mouth daily. 03/28/19   [provider]  tiZANidine (ZANAFLEX) 4 MG tablet TAKE 1 - 2 TABLETS BY MOUTH THREE TIMES DAILY AS NEEDED    [provider]  triamcinolone cream (KENALOG) 0.1 % Apply 1 application topically 2 (two) times daily as needed. 11/05/20   Einar Pheasant, MD    Family History Family History  Problem Relation Age of Onset   Heart disease Father    Heart attack Father    Heart disease Mother    Heart attack Mother    Heart attack Brother        MI's x 2    Hypertension Other    Colon cancer Other    Hypertension Sister    Heart attack Paternal Grandfather    Hypertension Sister      Social History Social History   Tobacco Use   Smoking status: Never   Smokeless tobacco: Never  Vaping Use   Vaping Use: Never used  Substance Use Topics   Alcohol use: No    Alcohol/week: 0.0 standard drinks   Drug use: No     Allergies   Corticosteroids, Penicillins, and Sulfa antibiotics   Review of Systems Review of Systems  Constitutional:  Negative for activity change, appetite change, chills, diaphoresis and fever.  HENT:  Positive for congestion, postnasal drip and  rhinorrhea. Negative for ear discharge and ear pain.   Eyes:  Positive for photophobia. Negative for pain, discharge and redness.  Gastrointestinal:  Negative for nausea.  Musculoskeletal:  Positive for arthralgias and back pain. Negative for gait problem.       Has RA and chronic back pain  Skin:  Positive for rash.  Neurological:  Positive for dizziness and light-headedness. Negative for headaches.  Hematological:  Negative for adenopathy.  Psychiatric/Behavioral:         Admits of being under a lot of stress    Physical Exam Triage Vital Signs ED Triage Vitals [12/08/20 1350]  Enc Vitals Group     BP 130/84     Pulse Rate 96     Resp      Temp 98.8 F (37.1 C)     Temp Source Oral     SpO2 96 %     Weight      Height      Head Circumference      Peak Flow      Pain Score      Pain Loc      Pain Edu?      Excl. in [place]?    No data found.  Updated Vital Signs BP 130/84 (BP Location: Right Arm)   Pulse 96   Temp 98.8 F (37.1 C) (Oral)   SpO2 96%   Visual Acuity Right Eye Distance:   Left Eye Distance:   Bilateral Distance:    Right Eye Near:   Left Eye Near:    Bilateral Near:     Physical Exam Physical Exam Vitals signs and nursing note reviewed.  Constitutional:      General: He is not in acute distress.    Appearance: He is well-developed and normal weight. He is not ill-appearing, th TM's are dull and gray    Head: Normocephalic. Temporal pulses are normal, no  hardening noted and is not tender on this area Eyes:     Extraocular Movements: Extraocular movements intact.     Pupils: Pupils are equal, round, and reactive to light.  Neck:     Musculoskeletal: Neck supple. No neck rigidity.     Meningeal: Brudzinski's sign absent.  Cardiovascular:     Rate and Rhythm: Normal rate and regular rhythm.     Heart sounds: No murmur.  Pulmonary:     Effort: Pulmonary effort is normal.     Breath sounds: Normal breath sounds. No wheezing, rhonchi or rales.  Musculoskeletal: Normal range of motion.  Lymphadenopathy:     Cervical: No cervical adenopathy.  Skin:    General: Skin is warm and dry. Has abrasion look circular skin areas on shin, R face, L forearm. The chin one is erythematous and little warm. Not painful. No bruising or petechia seen.  Neurological:     Mental Status: He is alert.     Cranial Nerves: No cranial nerve deficit or facial asymmetry.     Sensory: No sensory deficit.     Motor: No weakness.     Coordination: Romberg sign negative. Coordination normal.     Gait: Gait normal.     Deep Tendon Reflexes: Reflexes normal.     Comments: Normal Romberg,finger to nose, tandem gait.   Psychiatric:        Mood and Affect: Mood normal.        Speech: Speech normal.        Behavior: Behavior normal.    UC Treatments / Results  Labs (all labs ordered are listed, but only abnormal results are displayed) Labs Reviewed - No data to display  EKG   Radiology No results found.  Procedures Procedures (including critical care time)  Medications Ordered in UC Medications - No data to display  Initial Impression / Assessment and Plan / UC Course  I have reviewed the triage vital signs and the nursing notes. Rash of unknown cause, current rash present looks more suspicious of impetigo. Also has SOM and could be the reason for feeling off balance. I placed him on Doxy and Flonase as noted.  Needs to FU with derm as scheduled this month.    Final Clinical Impressions(s) / UC Diagnoses   Final diagnoses:  Rash and nonspecific skin eruption  Episodic cluster headache, not intractable     Discharge Instructions      Try the flonase for one week to see if the dizziness is just fluid in the middle ear, if so it will resolve. But if not and the headache persist, you will need to have more test done than what we can do here.      ED Prescriptions     Medication Sig Dispense Auth. Provider   doxycycline (VIBRAMYCIN) 100 MG capsule Take 1 capsule (100 mg total) by mouth 2 (two) times daily. 20 capsule Rodriguez-Southworth, Tyasia Packard, PA-C   fluticasone (FLONASE) 50 MCG/ACT nasal spray Place 1 spray into both nostrils 2 (two) times daily. 16 g Rodriguez-Southworth, Sunday Spillers, PA-C      PDMP not reviewed this encounter.   Shelby Mattocks, Hershal Coria 12/08/20 2108

## 2020-12-09 DIAGNOSIS — M25511 Pain in right shoulder: Secondary | ICD-10-CM | POA: Diagnosis not present

## 2020-12-09 DIAGNOSIS — G894 Chronic pain syndrome: Secondary | ICD-10-CM | POA: Diagnosis not present

## 2020-12-09 DIAGNOSIS — M7918 Myalgia, other site: Secondary | ICD-10-CM | POA: Diagnosis not present

## 2020-12-09 DIAGNOSIS — Z8719 Personal history of other diseases of the digestive system: Secondary | ICD-10-CM | POA: Diagnosis not present

## 2020-12-09 DIAGNOSIS — R202 Paresthesia of skin: Secondary | ICD-10-CM | POA: Diagnosis not present

## 2020-12-09 DIAGNOSIS — M5030 Other cervical disc degeneration, unspecified cervical region: Secondary | ICD-10-CM | POA: Diagnosis not present

## 2020-12-09 DIAGNOSIS — M25512 Pain in left shoulder: Secondary | ICD-10-CM | POA: Diagnosis not present

## 2020-12-09 DIAGNOSIS — Z79899 Other long term (current) drug therapy: Secondary | ICD-10-CM | POA: Diagnosis not present

## 2020-12-09 DIAGNOSIS — R200 Anesthesia of skin: Secondary | ICD-10-CM | POA: Diagnosis not present

## 2020-12-09 DIAGNOSIS — M25562 Pain in left knee: Secondary | ICD-10-CM | POA: Diagnosis not present

## 2020-12-09 DIAGNOSIS — Z79891 Long term (current) use of opiate analgesic: Secondary | ICD-10-CM | POA: Diagnosis not present

## 2020-12-09 DIAGNOSIS — M79605 Pain in left leg: Secondary | ICD-10-CM | POA: Diagnosis not present

## 2020-12-09 DIAGNOSIS — M5136 Other intervertebral disc degeneration, lumbar region: Secondary | ICD-10-CM | POA: Diagnosis not present

## 2020-12-09 DIAGNOSIS — Z9049 Acquired absence of other specified parts of digestive tract: Secondary | ICD-10-CM | POA: Diagnosis not present

## 2020-12-09 DIAGNOSIS — G8929 Other chronic pain: Secondary | ICD-10-CM | POA: Diagnosis not present

## 2020-12-09 DIAGNOSIS — M25522 Pain in left elbow: Secondary | ICD-10-CM | POA: Diagnosis not present

## 2020-12-09 DIAGNOSIS — M25561 Pain in right knee: Secondary | ICD-10-CM | POA: Diagnosis not present

## 2020-12-09 DIAGNOSIS — Z760 Encounter for issue of repeat prescription: Secondary | ICD-10-CM | POA: Diagnosis not present

## 2020-12-16 ENCOUNTER — Encounter: Payer: Self-pay | Admitting: Internal Medicine

## 2020-12-16 ENCOUNTER — Telehealth: Payer: Self-pay

## 2020-12-16 ENCOUNTER — Telehealth (INDEPENDENT_AMBULATORY_CARE_PROVIDER_SITE_OTHER): Payer: Medicare HMO | Admitting: Adult Health

## 2020-12-16 ENCOUNTER — Encounter: Payer: Self-pay | Admitting: Adult Health

## 2020-12-16 ENCOUNTER — Telehealth: Payer: Self-pay | Admitting: Internal Medicine

## 2020-12-16 VITALS — Ht 74.02 in | Wt 209.0 lb

## 2020-12-16 DIAGNOSIS — J111 Influenza due to unidentified influenza virus with other respiratory manifestations: Secondary | ICD-10-CM | POA: Insufficient documentation

## 2020-12-16 DIAGNOSIS — E7800 Pure hypercholesterolemia, unspecified: Secondary | ICD-10-CM | POA: Diagnosis not present

## 2020-12-16 DIAGNOSIS — R002 Palpitations: Secondary | ICD-10-CM | POA: Diagnosis not present

## 2020-12-16 DIAGNOSIS — Z20828 Contact with and (suspected) exposure to other viral communicable diseases: Secondary | ICD-10-CM | POA: Diagnosis not present

## 2020-12-16 DIAGNOSIS — R55 Syncope and collapse: Secondary | ICD-10-CM | POA: Diagnosis not present

## 2020-12-16 DIAGNOSIS — Z8371 Family history of colonic polyps: Secondary | ICD-10-CM | POA: Diagnosis not present

## 2020-12-16 DIAGNOSIS — R1013 Epigastric pain: Secondary | ICD-10-CM | POA: Diagnosis not present

## 2020-12-16 DIAGNOSIS — R079 Chest pain, unspecified: Secondary | ICD-10-CM | POA: Diagnosis not present

## 2020-12-16 DIAGNOSIS — R000 Tachycardia, unspecified: Secondary | ICD-10-CM | POA: Diagnosis not present

## 2020-12-16 DIAGNOSIS — I10 Essential (primary) hypertension: Secondary | ICD-10-CM | POA: Diagnosis not present

## 2020-12-16 DIAGNOSIS — Z9889 Other specified postprocedural states: Secondary | ICD-10-CM | POA: Diagnosis not present

## 2020-12-16 DIAGNOSIS — K219 Gastro-esophageal reflux disease without esophagitis: Secondary | ICD-10-CM | POA: Diagnosis not present

## 2020-12-16 DIAGNOSIS — R6889 Other general symptoms and signs: Secondary | ICD-10-CM | POA: Diagnosis not present

## 2020-12-16 MED ORDER — OSELTAMIVIR PHOSPHATE 75 MG PO CAPS: 75.0000 mg | ORAL_CAPSULE | Freq: Two times a day (BID) | ORAL | 0 refills | Status: DC

## 2020-12-16 NOTE — Telephone Encounter (Signed)
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

## 2020-12-16 NOTE — Telephone Encounter (Signed)
See other message for triage note.

## 2020-12-16 NOTE — Telephone Encounter (Signed)
Pt called in regards to girlfriend testing positive for flu and was given a medication from her doctor called oseltamivir. 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 Pt wanted to discuss with PCP or CMA.

## 2020-12-16 NOTE — Telephone Encounter (Signed)
Spoke with patient. He started having flu like symptoms 11/15. Patient stated he is tired, having body aches, runny nose. Denies fever, chills, sob, chest tightness, etc. Advised that his girlfriends doctor stated that since he is immunocompromised, he should start tamiflu asap. Advised that Dr Nicki Reaper is out of the office this PM. Patient agreed to do an appt with NP this PM but requested that message be sent to Dr Nicki Reaper first to see if she would just send in medication for him. Advised typically, a visit would be best within 72 hours of symptom onset to discuss tamiflu/best treatment. Pt agreed to keep appt this PM if you feel necessary.

## 2020-12-16 NOTE — Telephone Encounter (Signed)
Reviewed. He was seen this pm.

## 2020-12-16 NOTE — Progress Notes (Signed)
Virtual Visit via Video Note  I connected with [name] on 12/20/20 at  4:30 PM EST by a video enabled telemedicine application and verified that I am speaking with the correct person using two identifiers.  Location: Patient: at home  Provider: Provider: Provider's office at  [facility], [place].      I discussed the limitations of evaluation and management by telemedicine and the availability of in person appointments. The patient expressed understanding and agreed to proceed.  History of Present Illness:   Patient started with body aches and fatigue today 12/16/20 temp 98.3 mild headache.    He is on Humira.  His girlfriend has flu positive type A. Patient  denies any fever, body aches,chills, rash, chest pain, shortness of breath, nausea, vomiting, or diarrhea.     Observations/Objective:   Patient is alert and oriented and responsive to questions Engages in conversation with provider. Speaks in full sentences without any pauses without any shortness of breath or distress.    Assessment and Plan:  He desires Tamiflu.  Meds ordered this encounter  Medications   oseltamivir (TAMIFLU) 75 MG capsule    Sig: Take 1 capsule (75 mg total) by mouth 2 (two) times daily.    Dispense:  10 capsule    Refill:  0   Discussed side effects with Tamiflu and medications and when to seek care. He is immunocompromised on Humira.  Discussed with supervising MD [name] who is in agreement with treatment plan.  Follow Up Instructions: CMP is within normal limits.   [facility] [address] [place] [phone]   I discussed the assessment and treatment plan with the patient. The patient was provided an opportunity to ask questions and all were answered. The patient agreed with the plan and demonstrated an understanding of the instructions.   The patient was advised to call back or seek an in-person evaluation if the symptoms worsen or if the  condition fails to improve as anticipated.  Return if symptoms worsen or fail to improve, for at any time for any worsening symptoms, Go to Emergency room/ urgent care if worse.   Red Flags discussed. The patient was given clear instructions to go to ER or return to medical center if any red flags develop, symptoms do not improve, worsen or new problems develop. They verbalized understanding.  I provided  22 minutes of non-face-to-face time during this encounter.   Marcille Buffy, FNP

## 2020-12-20 ENCOUNTER — Encounter: Payer: Self-pay | Admitting: Adult Health

## 2020-12-20 NOTE — Patient Instructions (Signed)
Influenza, Adult Influenza, also called "the flu," is a viral infection that mainly affects the respiratory tract. This includes the lungs, nose, and throat. The flu spreads easily from person to person (is contagious). It causes common cold symptoms, along with high fever and body aches. What are the causes? This condition is caused by the influenza virus. You can get the virus by: Breathing in droplets that are in the air from an infected person's cough or sneeze. Touching something that has the virus on it (has been contaminated) and then touching your mouth, nose, or eyes. What increases the risk? The following factors may make you more likely to get the flu: Not washing or sanitizing your hands often. Having close contact with many people during cold and flu season. Touching your mouth, eyes, or nose without first washing or sanitizing your hands. Not getting an annual flu shot. You may have a higher risk for the flu, including serious problems, such as a lung infection (pneumonia), if you: Are older than 65. Are pregnant. Have a weakened disease-fighting system (immune system). This includes people who have HIV or AIDS, are on chemotherapy, or are taking medicines that reduce (suppress) the immune system. Have a long-term (chronic) illness, such as heart disease, kidney disease, diabetes, or lung disease. Have a liver disorder. Are severely overweight (morbidly obese). Have anemia. Have asthma. What are the signs or symptoms? Symptoms of this condition usually begin suddenly and last 4-14 days. These may include: Fever and chills. Headaches, body aches, or muscle aches. Sore throat. Cough. Runny or stuffy (congested) nose. Chest discomfort. Poor appetite. Weakness or fatigue. Dizziness. Nausea or vomiting. How is this diagnosed? This condition may be diagnosed based on: Your symptoms and medical history. A physical exam. Swabbing your nose or throat and testing the fluid  for the influenza virus. How is this treated? If the flu is diagnosed early, you can be treated with antiviral medicine that is given by mouth (orally) or through an IV. This can help reduce how severe the illness is and how long it lasts. Taking care of yourself at home can help relieve symptoms. Your health care provider may recommend: Taking over-the-counter medicines. Drinking plenty of fluids. In many cases, the flu goes away on its own. If you have severe symptoms or complications, you may be treated in a hospital. Follow these instructions at home: Activity Rest as needed and get plenty of sleep. Stay home from work or school as told by your health care provider. Unless you are visiting your health care provider, avoid leaving home until your fever has been gone for 24 hours without taking medicine. Eating and drinking Take an oral rehydration solution (ORS). This is a drink that is sold at pharmacies and retail stores. Drink enough fluid to keep your urine pale yellow. Drink clear fluids in small amounts as you are able. Clear fluids include water, ice chips, fruit juice mixed with water, and low-calorie sports drinks. Eat bland, easy-to-digest foods in small amounts as you are able. These foods include bananas, applesauce, rice, lean meats, toast, and crackers. Avoid drinking fluids that contain a lot of sugar or caffeine, such as energy drinks, regular sports drinks, and soda. Avoid alcohol. Avoid spicy or fatty foods. General instructions   Take over-the-counter and prescription medicines only as told by your health care provider. Use a cool mist humidifier to add humidity to the air in your home. This can make it easier to breathe. When using a cool mist humidifier,  clean it daily. Empty the water and replace it with clean water. Cover your mouth and nose when you cough or sneeze. Wash your hands with soap and water often and for at least 20 seconds, especially after you cough or  sneeze. If soap and water are not available, use alcohol-based hand sanitizer. Keep all follow-up visits. This is important. How is this prevented?  Get an annual flu shot. This is usually available in late summer, fall, or winter. Ask your health care provider when you should get your flu shot. Avoid contact with people who are sick during cold and flu season. This is generally fall and winter. Contact a health care provider if: You develop new symptoms. You have: Chest pain. Diarrhea. A fever. Your cough gets worse. You produce more mucus. You feel nauseous or you vomit. Get help right away if you: Develop shortness of breath or have difficulty breathing. Have skin or nails that turn a bluish color. Have severe pain or stiffness in your neck. Develop a sudden headache or sudden pain in your face or ear. Cannot eat or drink without vomiting. These symptoms may represent a serious problem that is an emergency. Do not wait to see if the symptoms will go away. Get medical help right away. Call your local emergency services (911 in the [place] Do not drive yourself to the hospital. Summary Influenza, also called "the flu," is a viral infection that primarily affects your respiratory tract. Symptoms of the flu usually begin suddenly and last 4-14 days. Getting an annual flu shot is the best way to prevent getting the flu. Stay home from work or school as told by your health care provider. Unless you are visiting your health care provider, avoid leaving home until your fever has been gone for 24 hours without taking medicine. Keep all follow-up visits. This is important. This information is not intended to replace advice given to you by your health care provider. Make sure you discuss any questions you have with your health care provider. Document Revised: 09/06/2019 Document Reviewed: 09/06/2019 Elsevier Patient Education  [place]. Oseltamivir Capsules What is this  medication? OSELTAMIVIR (os el TAM i vir) prevents and treats infections caused by the flu virus (influenza). It works by slowing the spread of the flu virus in your body and reducing how long your symptoms last. It will not treat colds or infections caused by bacteria or other viruses. It will not replace the annual flu vaccine. This medicine may be used for other purposes; ask your health care provider or pharmacist if you have questions. COMMON BRAND NAME(S): Tamiflu What should I tell my care team before I take this medication? They need to know if you have any of the following conditions: Difficulty swallowing Kidney disease An unusual or allergic reaction to oseltamivir, other medications, foods, dyes, or preservatives Pregnant or trying to get pregnant Breast-feeding How should I use this medication? Take this medication by mouth with water. Take it as directed on the prescription label at the same time every day. You can take it with or without food. If it upsets your stomach, take it with food. Take all of this medication unless your care team tells you to stop it early. Keep taking it even if you think you are better. When taking whole capsules: Do not cut, crush or chew this medication. Swallow the capsules whole. When mixing capsule contents with liquid: Open the capsule and mix the contents in a small bowl with a small  amount of a sweetened liquid such as chocolate syrup, corn syrup, caramel topping, or light brown sugar (dissolved in water). Stir the mixture and take the dose right away after mixing. Talk to your care team about the use of this medication in children. While it may be prescribed for selected conditions, precautions do apply. Overdosage: If you think you have taken too much of this medicine contact a poison control center or emergency room at once. NOTE: This medicine is only for you. Do not share this medicine with others. What if I miss a dose? If you miss a dose,  take it as soon as you remember. If it is almost time for your next dose (within 2 hours), take only that dose. Do not take double or extra doses. What may interact with this medication? Intranasal influenza vaccine This list may not describe all possible interactions. Give your health care provider a list of all the medicines, herbs, non-prescription drugs, or dietary supplements you use. Also tell them if you smoke, drink alcohol, or use illegal drugs. Some items may interact with your medicine. What should I watch for while using this medication? Visit your care team for regular checks on your progress. Tell your care team if your symptoms do not start to get better or if they get worse. If you have the flu, you may be at an increased risk of developing seizures, confusion, or abnormal behavior. This occurs early in the illness, and more frequently in children and teens. These events are not common, but may result in accidental injury to the patient. Families and caregivers of patients should watch for signs of unusual behavior and contact a care team right away if the patient shows signs of unusual behavior. To treat the flu, start taking this medication within 2 days of getting flu symptoms. This medication is not a substitute for the flu shot. Talk to your care team each year about an annual flu shot. What side effects may I notice from receiving this medication? Side effects that you should report to your care team as soon as possible: Allergic reactions--skin rash, itching, hives, swelling of the face, lips, tongue, or throat Confusion Hallucinations Redness, blistering, peeling, or loosening of the skin, including inside the mouth Seizures Tremors or shaking Trouble speaking Unusual changes in behavior Side effects that usually do not require medical attention (report to your care team if they continue or are bothersome): Headache Nausea Vomiting This list may not describe all  possible side effects. Call your doctor for medical advice about side effects. You may report side effects to FDA at 1-800-FDA-1088. Where should I keep my medication? Keep out of the reach of children and pets. Store at room temperature between 15 and 30 degrees C (59 and 86 degrees F). Throw away any unused medication after the expiration date. NOTE: This sheet is a summary. It may not cover all possible information. If you have questions about this medicine, talk to your doctor, pharmacist, or health care provider.  2022 [employer] (2020-10-06 00:00:00)

## 2020-12-21 DIAGNOSIS — L308 Other specified dermatitis: Secondary | ICD-10-CM | POA: Diagnosis not present

## 2020-12-21 DIAGNOSIS — R21 Rash and other nonspecific skin eruption: Secondary | ICD-10-CM | POA: Diagnosis not present

## 2020-12-28 ENCOUNTER — Other Ambulatory Visit: Payer: Self-pay | Admitting: Internal Medicine

## 2020-12-28 DIAGNOSIS — L308 Other specified dermatitis: Secondary | ICD-10-CM | POA: Diagnosis not present

## 2020-12-29 ENCOUNTER — Ambulatory Visit (INDEPENDENT_AMBULATORY_CARE_PROVIDER_SITE_OTHER): Payer: Medicare HMO

## 2020-12-29 VITALS — Ht 73.0 in | Wt 209.0 lb

## 2020-12-29 DIAGNOSIS — Z0000 Encounter for general adult medical examination without abnormal findings: Secondary | ICD-10-CM

## 2020-12-29 NOTE — Patient Instructions (Addendum)
[name] , Thank you for taking time to come for your Medicare Wellness Visit. I appreciate your ongoing commitment to your health goals. Please review the following plan we discussed and let me know if I can assist you in the future.   These are the goals we discussed:  Goals       Patient Stated     Weight goal 200lb (pt-stated)      Other     Increase physical activity      Swim more for exercise        This is a list of the screening recommended for you and due dates:  Health Maintenance  Topic Date Due   Zoster (Shingles) Vaccine (1 of 2) 03/30/2021*   Tetanus Vaccine  06/11/2024   Colon Cancer Screening  06/29/2025   Pneumococcal Vaccination  Completed   Flu Shot  Completed   COVID-19 Vaccine  Completed   Hepatitis C Screening: USPSTF Recommendation to screen - Ages 18-79 yo.  Completed   HIV Screening  Completed   HPV Vaccine  Aged Out  *Topic was postponed. The date shown is not the original due date.    Advanced directives: not yet on file  Conditions/risks identified: none new  Follow up in one year for your annual wellness visit   Preventive Care 40-64 Years, Male Preventive care refers to lifestyle choices and visits with your health care provider that can promote health and wellness. What does preventive care include? A yearly physical exam. This is also called an annual well check. Dental exams once or twice a year. Routine eye exams. Ask your health care provider how often you should have your eyes checked. Personal lifestyle choices, including: Daily care of your teeth and gums. Regular physical activity. Eating a healthy diet. Avoiding tobacco and drug use. Limiting alcohol use. Practicing safe sex. Taking low-dose aspirin every day starting at age 54. What happens during an annual well check? The services and screenings done by your health care provider during your annual well check will depend on your age, overall health, lifestyle risk  factors, and family history of disease. Counseling  Your health care provider may ask you questions about your: Alcohol use. Tobacco use. Drug use. Emotional well-being. Home and relationship well-being. Sexual activity. Eating habits. Work and work Statistician. Screening  You may have the following tests or measurements: Height, weight, and BMI. Blood pressure. Lipid and cholesterol levels. These may be checked every 5 years, or more frequently if you are over 95 years old. Skin check. Lung cancer screening. You may have this screening every year starting at age 66 if you have a 30-pack-year history of smoking and currently smoke or have quit within the past 15 years. Fecal occult blood test (FOBT) of the stool. You may have this test every year starting at age 71. Flexible sigmoidoscopy or colonoscopy. You may have a sigmoidoscopy every 5 years or a colonoscopy every 10 years starting at age 24. Prostate cancer screening. Recommendations will vary depending on your family history and other risks. Hepatitis C blood test. Hepatitis B blood test. Sexually transmitted disease (STD) testing. Diabetes screening. This is done by checking your blood sugar (glucose) after you have not eaten for a while (fasting). You may have this done every 1-3 years. Discuss your test results, treatment options, and if necessary, the need for more tests with your health care provider. Vaccines  Your health care provider may recommend certain vaccines, such as: Influenza vaccine. This  is recommended every year. Tetanus, diphtheria, and acellular pertussis (Tdap, Td) vaccine. You may need a Td booster every 10 years. Zoster vaccine. You may need this after age 68. Pneumococcal 13-valent conjugate (PCV13) vaccine. You may need this if you have certain conditions and have not been vaccinated. Pneumococcal polysaccharide (PPSV23) vaccine. You may need one or two doses if you smoke cigarettes or if you have  certain conditions. Talk to your health care provider about which screenings and vaccines you need and how often you need them. This information is not intended to replace advice given to you by your health care provider. Make sure you discuss any questions you have with your health care provider. Document Released: 02/13/2015 Document Revised: 10/07/2015 Document Reviewed: 11/18/2014 Elsevier Interactive Patient Education  2017 [place] Prevention in the Home Falls can cause injuries. They can happen to people of all ages. There are many things you can do to make your home safe and to help prevent falls. What can I do on the outside of my home? Regularly fix the edges of walkways and driveways and fix any cracks. Remove anything that might make you trip as you walk through a door, such as a raised step or threshold. Trim any bushes or trees on the path to your home. Use bright outdoor lighting. Clear any walking paths of anything that might make someone trip, such as rocks or tools. Regularly check to see if handrails are loose or broken. Make sure that both sides of any steps have handrails. Any raised decks and porches should have guardrails on the edges. Have any leaves, snow, or ice cleared regularly. Use sand or salt on walking paths during winter. Clean up any spills in your garage right away. This includes oil or grease spills. What can I do in the bathroom? Use night lights. Install grab bars by the toilet and in the tub and shower. Do not use towel bars as grab bars. Use non-skid mats or decals in the tub or shower. If you need to sit down in the shower, use a plastic, non-slip stool. Keep the floor dry. Clean up any water that spills on the floor as soon as it happens. Remove soap buildup in the tub or shower regularly. Attach bath mats securely with double-sided non-slip rug tape. Do not have throw rugs and other things on the floor that can make you trip. What can  I do in the bedroom? Use night lights. Make sure that you have a light by your bed that is easy to reach. Do not use any sheets or blankets that are too big for your bed. They should not hang down onto the floor. Have a firm chair that has side arms. You can use this for support while you get dressed. Do not have throw rugs and other things on the floor that can make you trip. What can I do in the kitchen? Clean up any spills right away. Avoid walking on wet floors. Keep items that you use a lot in easy-to-reach places. If you need to reach something above you, use a strong step stool that has a grab bar. Keep electrical cords out of the way. Do not use floor polish or wax that makes floors slippery. If you must use wax, use non-skid floor wax. Do not have throw rugs and other things on the floor that can make you trip. What can I do with my stairs? Do not leave any items on the stairs. Make  sure that there are handrails on both sides of the stairs and use them. Fix handrails that are broken or loose. Make sure that handrails are as long as the stairways. Check any carpeting to make sure that it is firmly attached to the stairs. Fix any carpet that is loose or worn. Avoid having throw rugs at the top or bottom of the stairs. If you do have throw rugs, attach them to the floor with carpet tape. Make sure that you have a light switch at the top of the stairs and the bottom of the stairs. If you do not have them, ask someone to add them for you. What else can I do to help prevent falls? Wear shoes that: Do not have high heels. Have rubber bottoms. Are comfortable and fit you well. Are closed at the toe. Do not wear sandals. If you use a stepladder: Make sure that it is fully opened. Do not climb a closed stepladder. Make sure that both sides of the stepladder are locked into place. Ask someone to hold it for you, if possible. Clearly mark and make sure that you can see: Any grab bars or  handrails. First and last steps. Where the edge of each step is. Use tools that help you move around (mobility aids) if they are needed. These include: Canes. Walkers. Scooters. Crutches. Turn on the lights when you go into a dark area. Replace any light bulbs as soon as they burn out. Set up your furniture so you have a clear path. Avoid moving your furniture around. If any of your floors are uneven, fix them. If there are any pets around you, be aware of where they are. Review your medicines with your doctor. Some medicines can make you feel dizzy. This can increase your chance of falling. Ask your doctor what other things that you can do to help prevent falls. This information is not intended to replace advice given to you by your health care provider. Make sure you discuss any questions you have with your health care provider. Document Released: 11/13/2008 Document Revised: 06/25/2015 Document Reviewed: 02/21/2014 Elsevier Interactive Patient Education  2017 [place].  Opioid Pain Medicine Management Opioids are powerful medicines that are used to treat moderate to severe pain. When used for short periods of time, they can help you to: Sleep better. Do better in physical or occupational therapy. Feel better in the first few days after an injury. Recover from surgery. Opioids should be taken with the supervision of a trained health care provider. They should be taken for the shortest period of time possible. This is because opioids can be addictive, and the longer you take opioids, the greater your risk of addiction. This addiction can also be called opioid use disorder. What are the risks? Using opioid pain medicines for longer than 3 days increases your risk of side effects. Side effects include: Constipation. Nausea and vomiting. Breathing difficulties (respiratory depression). Drowsiness. Confusion. Opioid use disorder. Itching. Taking opioid pain medicine for a long  period of time can affect your ability to do daily tasks. It also puts you at risk for: Motor vehicle crashes. Depression. Suicide. Heart attack. Overdose, which can be life-threatening. What is a pain treatment plan? A pain treatment plan is an agreement between you and your health care provider. Pain is unique to each person, and treatments vary depending on your condition. To manage your pain, you and your health care provider need to work together. To help you do this: Discuss  the goals of your treatment, including how much pain you might expect to have and how you will manage the pain. Review the risks and benefits of taking opioid medicines. Remember that a good treatment plan uses more than one approach and minimizes the chance of side effects. Be honest about the amount of medicines you take and about any drug or alcohol use. Get pain medicine prescriptions from only one health care provider. Pain can be managed with many types of alternative treatments. Ask your health care provider to refer you to one or more specialists who can help you manage pain through: Physical or occupational therapy. Counseling (cognitive behavioral therapy). Good nutrition. Biofeedback. Massage. Meditation. Non-opioid medicine. Following a gentle exercise program. How to use opioid pain medicine Taking medicine Take your pain medicine exactly as told by your health care provider. Take it only when you need it. If your pain gets less severe, you may take less than your prescribed dose if your health care provider approves. If you are not having pain, do nottake pain medicine unless your health care provider tells you to take it. If your pain is severe, do nottry to treat it yourself by taking more pills than instructed on your prescription. Contact your health care provider for help. Write down the times when you take your pain medicine. It is easy to become confused while on pain medicine. Writing the  time can help you avoid overdose. Take other over-the-counter or prescription medicines only as told by your health care provider. Keeping yourself and others safe  While you are taking opioid pain medicine: Do not drive, use machinery, or power tools. Do not sign legal documents. Do not drink alcohol. Do not take sleeping pills. Do not supervise children by yourself. Do not do activities that require climbing or being in high places. Do not go to a lake, river, ocean, spa, or swimming pool. Do not share your pain medicine with anyone. Keep pain medicine in a locked cabinet or in a secure area where pets and children cannot reach it. Stopping your use of opioids If you have been taking opioid medicine for more than a few weeks, you may need to slowly decrease (taper) how much you take until you stop completely. Tapering your use of opioids can decrease your risk of symptoms of withdrawal, such as: Pain and cramping in the abdomen. Nausea. Sweating. Sleepiness. Restlessness. Uncontrollable shaking (tremors). Cravings for the medicine. Do not attempt to taper your use of opioids on your own. Talk with your health care provider about how to do this. Your health care provider may prescribe a step-down schedule based on how much medicine you are taking and how long you have been taking it. Getting rid of leftover pills Do not save any leftover pills. Get rid of leftover pills safely by: Taking the medicine to a prescription take-back program. This is usually offered by the county or law enforcement. Bringing them to a pharmacy that has a drug disposal container. Flushing them down the toilet. Check the label or package insert of your medicine to see whether this is safe to do. Throwing them out in the trash. Check the label or package insert of your medicine to see whether this is safe to do. If it is safe to throw it out, remove the medicine from the original container, put it into a  sealable bag or container, and mix it with used coffee grounds, food scraps, dirt, or cat litter before putting it in the  trash. Follow these instructions at home: Activity Do exercises as told by your health care provider. Avoid activities that make your pain worse. Return to your normal activities as told by your health care provider. Ask your health care provider what activities are safe for you. General instructions You may need to take these actions to prevent or treat constipation: Drink enough fluid to keep your urine pale yellow. Take over-the-counter or prescription medicines. Eat foods that are high in fiber, such as beans, whole grains, and fresh fruits and vegetables. Limit foods that are high in fat and processed sugars, such as fried or sweet foods. Keep all follow-up visits. This is important. Where to find support If you have been taking opioids for a long time, you may benefit from receiving support for quitting from a local support group or counselor. Ask your health care provider for a referral to these resources in your area. Where to find more information Centers for Disease Control and Prevention (CDC): http://www.wolf.info/ [place] Food and Drug Administration (FDA): [place] Get help right away if: You may have taken too much of an opioid (overdosed). Common symptoms of an overdose: Your breathing is slower or more shallow than normal. You have a very slow heartbeat (pulse). You have slurred speech. You have nausea and vomiting. Your pupils become very small. You have other potential symptoms: You are very confused. You faint or feel like you will faint. You have cold, clammy skin. You have blue lips or fingernails. You have thoughts of harming yourself or harming others. These symptoms may represent a serious problem that is an emergency. Do not wait to see if the symptoms will go away. Get medical help right away. Call your local emergency services (911 in the [place] Do  not drive yourself to the hospital.  If you ever feel like you may hurt yourself or others, or have thoughts about taking your own life, get help right away. Go to your nearest emergency department or: Call your local emergency services (911 in the [place] Call the [facility] 612-427-2182 in the [place] Call a suicide crisis helpline, such as the [facility] at (919) 040-6000 or 988 in the [place]. This is open 24 hours a day in the [place] Text the Crisis Text Line at (819)727-7818 (in the [place].). Summary Opioid medicines can help you manage moderate to severe pain for a short period of time. A pain treatment plan is an agreement between you and your health care provider. Discuss the goals of your treatment, including how much pain you might expect to have and how you will manage the pain. If you think that you or someone else may have taken too much of an opioid, get medical help right away. This information is not intended to replace advice given to you by your health care provider. Make sure you discuss any questions you have with your health care provider. Document Revised: 08/12/2020 Document Reviewed: 04/29/2020 Elsevier Patient Education  [place].

## 2020-12-29 NOTE — Progress Notes (Signed)
Subjective:   [name] is a 60 y.o. male who presents for Medicare Annual/Subsequent preventive examination.  Review of Systems    No ROS.  Medicare Wellness Virtual Visit.  Visual/audio telehealth visit, UTA vital signs.   See social history for additional risk factors.   Cardiac Risk Factors include: advanced age (>62men, >38 women);male gender     Objective:    Today's Vitals   12/29/20 1449  Weight: 209 lb (94.8 kg)  Height: 6\' 1"  (1.854 m)   Body mass index is 27.57 kg/m.  Advanced Directives 12/29/2020 09/23/2019 07/23/2019 10/10/2017 04/17/2017 11/07/2016 04/14/2016  Does Patient Have a Medical Advance Directive? No No No No;Yes No No No  Type of Advance Directive - - Public librarian;Living will - - -  Does patient want to make changes to medical advance directive? No - Patient declined - - - - - -  Copy of Press photographer in Chart? - - - No - copy requested - - -  Would patient like information on creating a medical advance directive? - No - Patient declined No - Patient declined No - Patient declined Yes (MAU/Ambulatory/Procedural Areas - Information given) - No - Patient declined    Current Medications (verified) Outpatient Encounter Medications as of 12/29/2020  Medication Sig   Adalimumab 40 MG/0.4ML PNKT Inject 40 mg into the skin every 14 (fourteen) days.   amLODipine (NORVASC) 5 MG tablet TAKE 1 TABLET BY MOUTH TWICE A DAY   aspirin 81 MG chewable tablet Chew 81 mg by mouth daily as needed for moderate pain.    atomoxetine (STRATTERA) 100 MG capsule Take 100 mg by mouth daily.   doxycycline (VIBRAMYCIN) 100 MG capsule Take 1 capsule (100 mg total) by mouth 2 (two) times daily.   fluticasone (FLONASE) 50 MCG/ACT nasal spray Place 1 spray into both nostrils 2 (two) times daily.   gabapentin (NEURONTIN) 300 MG capsule Take 1 or 2 capsules by mouth TID   hydroxychloroquine (PLAQUENIL) 200 MG tablet Take by mouth.   modafinil  (PROVIGIL) 200 MG tablet Take 400 mg by mouth in the morning.   oseltamivir (TAMIFLU) 75 MG capsule Take 1 capsule (75 mg total) by mouth 2 (two) times daily.   oxyCODONE (OXY IR/ROXICODONE) 5 MG immediate release tablet Take 5-10 mg by mouth at bedtime as needed for moderate pain.    pantoprazole (PROTONIX) 40 MG tablet Take 40 mg by mouth 2 (two) times daily.   rosuvastatin (CRESTOR) 5 MG tablet TAKE 1 TABLET BY MOUTH EVERY DAY   sildenafil (REVATIO) 20 MG tablet TAKE 4 - 5 TABLETS BY MOUTH DAILY AS NEEDED   sucralfate (CARAFATE) 1 g tablet Take 1 g by mouth daily.    tamsulosin (FLOMAX) 0.4 MG CAPS capsule Take 0.4 mg by mouth daily.   tiZANidine (ZANAFLEX) 4 MG tablet TAKE 1 - 2 TABLETS BY MOUTH THREE TIMES DAILY AS NEEDED   triamcinolone cream (KENALOG) 0.1 % Apply 1 application topically 2 (two) times daily as needed.   No facility-administered encounter medications on file as of 12/29/2020.    Allergies (verified) Corticosteroids, Penicillins, and Sulfa antibiotics   History: Past Medical History:  Diagnosis Date   Allergy    Anoxic brain injury ([place])    Chronic back pain    s/p 5 back surgeries   Coronary artery disease    Card[facility] catheter[facility] in [facility] at [facility] showed 30% stenosis in proximal LAD. No other disease. Ejection fraction was 65%  Depression    H/O acute pancreatitis    Hypercholesterolemia    Mild cognitive impairment with memory loss    Pancreatitis    Past Surgical History:  Procedure Laterality Date   abdomial surgery     BACK SURGERY     multiple back surgeries (5)   CARDIAC CATHETERIZATION     [facility]    CHOLECYSTECTOMY  2013   COLONOSCOPY WITH PROPOFOL N/A 06/30/2015   Procedure: COLONOSCOPY WITH PROPOFOL;  Surgeon: Lollie Sails, MD;  Location[facility] ENDOSCOPY;  Service: Endoscopy;  Laterality: N/A;   ESOPHAGOGASTRODUODENOSCOPY (EGD) WITH PROPOFOL N/A 03/30/2016   Procedure: ESOPHAGOGASTRODUODENOSCOPY (EGD) WITH PROPOFOL;  Surgeon: Manya Silvas,  MD;  Location: [facility] ENDOSCOPY;  Service: Endoscopy;  Laterality: N/A;   FUNCTIONAL ENDOSCOPIC SINUS SURGERY     JOINT REPLACEMENT     LAMINECTOMY     LUMBAR SPINE SURGERY     SHOULDER ARTHROSCOPY WITH ROTATOR CUFF REPAIR AND OPEN BICEPS TENODESIS Right    SHOULDER ARTHROSCOPY WITH ROTATOR CUFF REPAIR AND OPEN BICEPS TENODESIS     SPINE SURGERY     TRIGGER POINT INJECTION     VASECTOMY     With Reversal   VEIN SURGERY Right 2013   arm   Family History  Problem Relation Age of Onset   Heart disease Father    Heart attack Father    Heart disease Mother    Heart attack Mother    Heart attack Brother        MI's x 2    Hypertension Other    Colon cancer Other    Hypertension Sister    Heart attack Paternal Grandfather    Hypertension Sister    Social History   Socioeconomic History   Marital status: Divorced    Spouse name: Not on file   Number of children: 3   Years of education: Not on file   Highest education level: Not on file  Occupational History   Not on file  Tobacco Use   Smoking status: Never   Smokeless tobacco: Never  Vaping Use   Vaping Use: Never used  Substance and Sexual Activity   Alcohol use: No    Alcohol/week: 0.0 standard drinks   Drug use: No   Sexual activity: Yes  Other Topics Concern   Not on file  Social History Narrative   Not on file   Social Determinants of Health   Financial Resource Strain: Low Risk    Difficulty of Paying Living Expenses: Not hard at all  Food Insecurity: No Food Insecurity   Worried About Charity fundraiser in the Last Year: Never true   Ran Out of Food in the Last Year: Never true  Transportation Needs: No Transportation Needs   Lack of Transportation (Medical): No   Lack of Transportation (Non-Medical): No  Physical Activity: Not on file  Stress: No Stress Concern Present   Feeling of Stress : Not at all  Social Connections: Unknown   Frequency of Communication with Friends and Family: More than three  times a week   Frequency of Social Gatherings with Friends and Family: More than three times a week   Attends Religious Services: Not on Electrical engineer or Organizations: Not on file   Attends Archivist Meetings: Not on file   Marital Status: Not on file    Tobacco Counseling Counseling given: Not Answered   Clinical Intake:  Pre-visit preparation completed: Yes  Diabetes: No  How often do you need to have someone help you when you read instructions, pamphlets, or other written materials from your doctor or pharmacy?: 1 - Never  Interpreter Needed?: No      Activities of Daily Living In your present state of health, do you have any difficulty performing the following activities: 12/29/2020  Hearing? N  Vision? N  Difficulty concentrating or making decisions? Y  Comment Dx short term memory loss  Walking or climbing stairs? Y  Comment Paces self with walking. Cane in use when needed.  Dressing or bathing? N  Doing errands, shopping? N  Preparing Food and eating ? N  Using the Toilet? N  In the past six months, have you accidently leaked urine? N  Do you have problems with loss of bowel control? N  Managing your Medications? N  Managing your Finances? N  Housekeeping or managing your Housekeeping? Y  Some recent data might be hidden    Patient Care Team: Einar Pheasant, MD as PCP - General (Internal Medicine) Bary Castilla, [name], MD (General Surgery) Crecencio Mc, MD (Internal Medicine)  Indicate any recent Medical Services you may have received from other than [facility] providers in the past year (date may be approximate).     Assessment:   This is a routine wellness examination for Darel.  Virtual Visit via Telephone Note  I connected with  Thaniel L Flemister on 12/29/20 at  2:45 PM EST by telephone and verified that I am speaking with the correct person using two identifiers.  Location: Patient: home Provider:  office Persons participating in the virtual visit: patient/Nurse Health Advisor   I discussed the limitations, risks, security and privacy concerns of performing an evaluation and management service by telephone and the availability of in person appointments. The patient expressed understanding and agreed to proceed.  Interactive audio and video telecommunications were attempted between this nurse and patient, however failed, due to patient having technical difficulties OR patient did not have access to video capability.  We continued and completed visit with audio only.  Some vital signs may be absent or patient reported.   Hearing/Vision screen Hearing Screening - Comments:: Patient is able to hear conversational tones without difficulty. No issues reported. Vision Screening - Comments:: Followed by [facility]  Wears corrective lenses  They have seen their ophthalmologist.   Dietary issues and exercise activities discussed: Current Exercise Habits: Home exercise routine, Type of exercise: walking, Intensity: Mild Regular diet   Goals Addressed               This Visit's Progress     Patient Stated     Weight goal 200lb (pt-stated)        Other     Increase physical activity   On track     Swim more for exercise       Depression Screen PHQ 2/9 Scores 12/29/2020 09/07/2020 06/01/2020 01/06/2020 07/23/2019 04/17/2017 04/14/2016  PHQ - 2 Score 0 3 1 0 1 0 0  PHQ- 9 Score - 14 16 - - - -    Fall Risk Fall Risk  12/29/2020 01/06/2020 07/23/2019 05/30/2018 04/17/2017  Falls in the past year? 0 1 0 0 No  Number falls in past yr: - 1 1 - -  Injury with Fall? - 1 0 - -  Risk Factor Category  - - - - -  Risk for fall due to : - - Impaired balance/gait - -  Risk for fall  due to: Comment - - - - -  Follow up Falls evaluation completed Falls evaluation completed - - -    FALL RISK PREVENTION PERTAINING TO THE HOME: Home free of loose throw rugs in walkways, pet beds, electrical  cords, etc? Yes  Adequate lighting in your home to reduce risk of falls? Yes   ASSISTIVE DEVICES UTILIZED TO PREVENT FALLS: Use of a cane, walker or w/c? Yes , cane as needed  TIMED UP AND GO: Was the test performed? No .   Cognitive Function: MMSE - Mini Mental State Exam 04/17/2017 04/14/2016  Orientation to time 5 5  Orientation to Place 5 5  Registration 3 3  Attention/ Calculation 5 5  Recall 2 3  Language- name 2 objects 2 2  Language- repeat 1 1  Language- follow 3 step command 3 3  Language- read & follow direction 1 1  Write a sentence 1 1  Copy design 1 1  Total score 29 30     6CIT Screen 12/29/2020 07/23/2019  What Year? 0 points 0 points  What month? 0 points 0 points  What time? 0 points -  Months in reverse 0 points 2 points  Repeat phrase - 2 points    Immunizations Immunization History  Administered Date(s) Administered   Hep A / Hep B 01/11/2020, 04/21/2020   Influenza Inj Mdck Quad Pf 10/07/2017   Influenza,inj,Quad PF,6+ Mos 10/15/2014, 10/06/2015, 10/11/2018   Influenza-Unspecified 11/14/2013, 09/28/2016, 10/01/2019, 10/15/2020   Moderna Covid-19 Vaccine Bivalent Booster 27yrs & up 10/15/2020   Moderna Sars-Covid-2 Vaccination 04/22/2019, 05/20/2019, 12/16/2019, 05/01/2020   PNEUMOCOCCAL CONJUGATE-20 09/07/2020   Tdap 06/12/2014   Screening Tests Health Maintenance  Topic Date Due   Zoster Vaccines- Shingrix (1 of 2) 03/30/2021 (Originally 08/21/1979)   TETANUS/TDAP  06/11/2024   COLONOSCOPY (Pts 45-60yrs Insurance coverage will need to be confirmed)  06/29/2025   Pneumococcal Vaccine 44-31 Years old  Completed   INFLUENZA VACCINE  Completed   COVID-19 Vaccine  Completed   Hepatitis C Screening  Completed   HIV Screening  Completed   HPV VACCINES  Aged Out    Health Maintenance There are no preventive care reminders to display for this patient.  Lung Cancer Screening: (Low Dose CT Chest recommended if Age 34-80 years, 30 pack-year  currently smoking OR have quit w/in 15years.) does not qualify.   Vision Screening: Recommended annual ophthalmology exams for early detection of glaucoma and other disorders of the eye.  Dental Screening: Recommended annual dental exams for proper oral hygiene  Community Resource Referral / Chronic Care Management: CRR required this visit?  No   CCM required this visit?  No      Plan:   Keep all routine maintenance appointments.   I have personally reviewed and noted the following in the patient's chart:   Medical and social history Use of alcohol, tobacco or illicit drugs  Current medications and supplements including opioid prescriptions. Patient is currently taking opioid prescriptions. Information provided to patient regarding non-opioid alternatives. Patient advised to discuss non-opioid treatment plan with their provider. Followed by [facility],  Dr. Tyler Pita Functional ability and status Nutritional status Physical activity Advanced directives List of other physicians Hospitalizations, surgeries, and ER visits in previous 12 months Vitals Screenings to include cognitive, depression, and falls Referrals and appointments  In addition, I have reviewed and discussed with patient certain preventive protocols, quality metrics, and best practice recommendations. A written personalized care plan for preventive services as well as general  preventive health recommendations were provided to patient.     Varney Biles, LPN   [id number]

## 2021-01-03 DIAGNOSIS — G8929 Other chronic pain: Secondary | ICD-10-CM | POA: Diagnosis not present

## 2021-01-03 DIAGNOSIS — R5383 Other fatigue: Secondary | ICD-10-CM | POA: Diagnosis not present

## 2021-01-03 DIAGNOSIS — R400 Somnolence: Secondary | ICD-10-CM | POA: Diagnosis not present

## 2021-01-03 DIAGNOSIS — R4182 Altered mental status, unspecified: Secondary | ICD-10-CM | POA: Diagnosis not present

## 2021-01-03 DIAGNOSIS — Z9889 Other specified postprocedural states: Secondary | ICD-10-CM | POA: Diagnosis not present

## 2021-01-03 DIAGNOSIS — R531 Weakness: Secondary | ICD-10-CM | POA: Diagnosis not present

## 2021-01-03 DIAGNOSIS — Z79899 Other long term (current) drug therapy: Secondary | ICD-10-CM | POA: Diagnosis not present

## 2021-01-03 DIAGNOSIS — M069 Rheumatoid arthritis, unspecified: Secondary | ICD-10-CM | POA: Diagnosis not present

## 2021-01-03 DIAGNOSIS — M549 Dorsalgia, unspecified: Secondary | ICD-10-CM | POA: Diagnosis not present

## 2021-01-03 DIAGNOSIS — Z20822 Contact with and (suspected) exposure to covid-19: Secondary | ICD-10-CM | POA: Diagnosis not present

## 2021-01-03 DIAGNOSIS — R21 Rash and other nonspecific skin eruption: Secondary | ICD-10-CM | POA: Diagnosis not present

## 2021-01-04 DIAGNOSIS — M3390 Dermatopolymyositis, unspecified, organ involvement unspecified: Secondary | ICD-10-CM | POA: Diagnosis not present

## 2021-01-11 ENCOUNTER — Ambulatory Visit (INDEPENDENT_AMBULATORY_CARE_PROVIDER_SITE_OTHER): Payer: Medicare HMO | Admitting: Internal Medicine

## 2021-01-11 ENCOUNTER — Other Ambulatory Visit: Payer: Self-pay

## 2021-01-11 ENCOUNTER — Encounter: Payer: Self-pay | Admitting: Internal Medicine

## 2021-01-11 DIAGNOSIS — M069 Rheumatoid arthritis, unspecified: Secondary | ICD-10-CM | POA: Diagnosis not present

## 2021-01-11 DIAGNOSIS — I77819 Aortic ectasia, unspecified site: Secondary | ICD-10-CM

## 2021-01-11 DIAGNOSIS — I10 Essential (primary) hypertension: Secondary | ICD-10-CM

## 2021-01-11 DIAGNOSIS — M545 Low back pain, unspecified: Secondary | ICD-10-CM

## 2021-01-11 DIAGNOSIS — Z8719 Personal history of other diseases of the digestive system: Secondary | ICD-10-CM | POA: Diagnosis not present

## 2021-01-11 DIAGNOSIS — R5383 Other fatigue: Secondary | ICD-10-CM

## 2021-01-11 DIAGNOSIS — G8929 Other chronic pain: Secondary | ICD-10-CM

## 2021-01-11 DIAGNOSIS — E7800 Pure hypercholesterolemia, unspecified: Secondary | ICD-10-CM | POA: Diagnosis not present

## 2021-01-11 DIAGNOSIS — R7989 Other specified abnormal findings of blood chemistry: Secondary | ICD-10-CM

## 2021-01-11 NOTE — Assessment & Plan Note (Signed)
Low cholesterol diet and exercise.  Continue crestor.  Check lipid panel and liver function tests.   

## 2021-01-11 NOTE — Assessment & Plan Note (Signed)
Fatigue and weakness as outlined.  Recently evaluated by dermatology and in ER.  Also being followed by rheumatology.  Planning MRI femur - muscle biopsy.  Labs reviewed.  Keep f/u appt 01/19/21.

## 2021-01-11 NOTE — Assessment & Plan Note (Addendum)
Found to have elevated alkaline phos.  Evaluated by GI.  Follow liver panel.  Recent fibroscan - no fibrosis.

## 2021-01-11 NOTE — Progress Notes (Signed)
Patient ID: [name], male   DOB: 31-Oct-1960, 60 y.o.   MRN: [id number]   Subjective:    Patient ID: [name], male    DOB: 08-10-1960, 61 y.o.   MRN: [id number]  This visit occurred during the SARS-CoV-2 public health emergency.  Safety protocols were in place, including screening questions prior to the visit, additional usage of staff PPE, and extensive cleaning of exam room while observing appropriate contact time as indicated for disinfecting solutions.   Patient here for a scheduled follow up.   Chief Complaint  Patient presents with   Fatigue   .   HPI Has RA - currently on humira - recently evaluated for rash and weakness and fatigue.  Seeing dermatology and rheumatology.  S/p punch biopsy - left forearm - differential diagnosis includes - connective tissue disease and an adverse drug reaction.  His rheumatologist evaluated w/up and felt dermatomyositis had to be ruled out.  He is scheduled for MRI left femur / muscle biopsy.  Rash has resolved.  He stopped his supplements and stopped gabapentin.  No fever.  Eating.  No nausea or vomiting.  No abdominal pain.  Bowels moving.  Planning to f/u with rheumatology 01/19/21.    Past Medical History:  Diagnosis Date   Allergy    Anoxic brain injury ([place])    Chronic back pain    s/p 5 back surgeries   Coronary artery disease    Cardiac catheterization in 2007 at [facility] showed 30% stenosis in proximal LAD. No other disease. Ejection fraction was 65%   Depression    H/O acute pancreatitis    Hypercholesterolemia    Mild cognitive impairment with memory loss    Pancreatitis    Past Surgical History:  Procedure Laterality Date   abdomial surgery     BACK SURGERY     multiple back surgeries (5)   CARDIAC CATHETERIZATION     [facility]    CHOLECYSTECTOMY  2013   COLONOSCOPY WITH PROPOFOL N/A 06/30/2015   Procedure: COLONOSCOPY WITH PROPOFOL;  Surgeon: Lollie Sails, MD;  [facility] ENDOSCOPY;  Service: Endoscopy;   Laterality: N/A;   ESOPHAGOGASTRODUODENOSCOPY (EGD) WITH PROPOFOL N/A 03/30/2016   Procedure: ESOPHAGOGASTRODUODENOSCOPY (EGD) WITH PROPOFOL;  Surgeon: Manya Silvas, MD;  Location: [facility] ENDOSCOPY;  Service: Endoscopy;  Laterality: N/A;   FUNCTIONAL ENDOSCOPIC SINUS SURGERY     JOINT REPLACEMENT     LAMINECTOMY     LUMBAR SPINE SURGERY     SHOULDER ARTHROSCOPY WITH ROTATOR CUFF REPAIR AND OPEN BICEPS TENODESIS Right    SHOULDER ARTHROSCOPY WITH ROTATOR CUFF REPAIR AND OPEN BICEPS TENODESIS     SPINE SURGERY     TRIGGER POINT INJECTION     VASECTOMY     With Reversal   VEIN SURGERY Right 2013   arm   Family History  Problem Relation Age of Onset   Heart disease Father    Heart attack Father    Heart disease Mother    Heart attack Mother    Heart attack Brother        MI's x 2    Hypertension Other    Colon cancer Other    Hypertension Sister    Heart attack Paternal Grandfather    Hypertension Sister    Social History   Socioeconomic History   Marital status: Divorced    Spouse name: Not on file   Number of children: 3   Years of education: Not on file   Highest education  level: Not on file  Occupational History   Not on file  Tobacco Use   Smoking status: Never   Smokeless tobacco: Never  Vaping Use   Vaping Use: Never used  Substance and Sexual Activity   Alcohol use: No    Alcohol/week: 0.0 standard drinks   Drug use: No   Sexual activity: Yes  Other Topics Concern   Not on file  Social History Narrative   Not on file   Social Determinants of Health   Financial Resource Strain: Low Risk    Difficulty of Paying Living Expenses: Not hard at all  Food Insecurity: No Food Insecurity   Worried About Charity fundraiser in the Last Year: Never true   [place] in the Last Year: Never true  Transportation Needs: No Transportation Needs   Lack of Transportation (Medical): No   Lack of Transportation (Non-Medical): No  Physical Activity: Not on file   Stress: No Stress Concern Present   Feeling of Stress : Not at all  Social Connections: Unknown   Frequency of Communication with Friends and Family: More than three times a week   Frequency of Social Gatherings with Friends and Family: More than three times a week   Attends Religious Services: Not on file   Active Member of Clubs or Organizations: Not on file   Attends Archivist Meetings: Not on file   Marital Status: Not on file     Review of Systems  Constitutional:  Positive for fatigue. Negative for appetite change.  HENT:  Negative for congestion and sinus pressure.   Respiratory:  Negative for cough, chest tightness and shortness of breath.   Cardiovascular:  Negative for chest pain, palpitations and leg swelling.  Gastrointestinal:  Negative for abdominal pain, diarrhea, nausea and vomiting.  Genitourinary:  Negative for difficulty urinating and dysuria.  Musculoskeletal:  Negative for joint swelling.       Reported weakness and fatigue as outlined.   Skin:        Rash has resolved - no rash currently around his neck or shoulders.   Neurological:  Negative for dizziness, light-headedness and headaches.  Psychiatric/Behavioral:  Negative for agitation and dysphoric mood.       Objective:     BP 118/72   Pulse 76   Temp 97.6 F (36.4 C)   Resp 16   Ht 6\' 1"  (1.854 m)   Wt 215 lb (97.5 kg)   SpO2 99%   BMI 28.37 kg/m  Wt Readings from Last 3 Encounters:  01/11/21 215 lb (97.5 kg)  12/29/20 209 lb (94.8 kg)  12/16/20 209 lb (94.8 kg)    Physical Exam Constitutional:      General: He is not in acute distress.    Appearance: Normal appearance. He is well-developed.  HENT:     Head: Normocephalic and atraumatic.     Right Ear: External ear normal.     Left Ear: External ear normal.  Eyes:     General: No scleral icterus.       Right eye: No discharge.        Left eye: No discharge.  Cardiovascular:     Rate and Rhythm: Normal rate and regular  rhythm.  Pulmonary:     Effort: Pulmonary effort is normal. No respiratory distress.     Breath sounds: Normal breath sounds.  Abdominal:     General: Bowel sounds are normal.     Palpations: Abdomen is soft.  Tenderness: There is no abdominal tenderness.  Musculoskeletal:        General: No swelling or tenderness.     Cervical back: Neck supple. No tenderness.     Comments: Motor strength - 5/5 bilateral upper and lower extremities.    Lymphadenopathy:     Cervical: No cervical adenopathy.  Skin:    Findings: No erythema or rash.  Neurological:     Mental Status: He is alert.  Psychiatric:        Mood and Affect: Mood normal.        Behavior: Behavior normal.     Outpatient Encounter Medications as of 01/11/2021  Medication Sig   Adalimumab 40 MG/0.4ML PNKT Inject 40 mg into the skin every 14 (fourteen) days.   amLODipine (NORVASC) 5 MG tablet TAKE 1 TABLET BY MOUTH TWICE A DAY   aspirin 81 MG chewable tablet Chew 81 mg by mouth daily as needed for moderate pain.    atomoxetine (STRATTERA) 100 MG capsule Take 100 mg by mouth daily.   doxycycline (VIBRAMYCIN) 100 MG capsule Take 1 capsule (100 mg total) by mouth 2 (two) times daily.   fluticasone (FLONASE) 50 MCG/ACT nasal spray Place 1 spray into both nostrils 2 (two) times daily.   gabapentin (NEURONTIN) 300 MG capsule Take 1 or 2 capsules by mouth TID   hydroxychloroquine (PLAQUENIL) 200 MG tablet Take by mouth.   modafinil (PROVIGIL) 200 MG tablet Take 400 mg by mouth in the morning.   oxyCODONE (OXY IR/ROXICODONE) 5 MG immediate release tablet Take 5-10 mg by mouth at bedtime as needed for moderate pain.    pantoprazole (PROTONIX) 40 MG tablet Take 40 mg by mouth 2 (two) times daily.   rosuvastatin (CRESTOR) 5 MG tablet TAKE 1 TABLET BY MOUTH EVERY DAY   sildenafil (REVATIO) 20 MG tablet TAKE 4 - 5 TABLETS BY MOUTH DAILY AS NEEDED   sucralfate (CARAFATE) 1 g tablet Take 1 g by mouth daily.    tamsulosin (FLOMAX) 0.4 MG  CAPS capsule Take 0.4 mg by mouth daily.   tiZANidine (ZANAFLEX) 4 MG tablet TAKE 1 - 2 TABLETS BY MOUTH THREE TIMES DAILY AS NEEDED   triamcinolone cream (KENALOG) 0.1 % Apply 1 application topically 2 (two) times daily as needed.   [DISCONTINUED] oseltamivir (TAMIFLU) 75 MG capsule Take 1 capsule (75 mg total) by mouth 2 (two) times daily.   No facility-administered encounter medications on file as of 01/11/2021.     Lab Results  Component Value Date   WBC 6.7 05/07/2020   HGB 13.7 05/07/2020   HCT 39.7 05/07/2020   PLT 354.0 05/07/2020   GLUCOSE 87 09/16/2020   CHOL 113 09/07/2020   TRIG 95.0 09/07/2020   HDL 49.30 09/07/2020   LDLCALC 44 09/07/2020   ALT 27 09/07/2020   AST 24 09/07/2020   NA 138 09/16/2020   K 4.1 09/16/2020   CL 102 09/16/2020   CREATININE 1.00 09/16/2020   BUN 14 09/16/2020   CO2 27 09/16/2020   TSH 2.11 10/14/2020   PSA 0.63 09/07/2020   INR 1.0 09/24/2019   HGBA1C 5.6 09/23/2019       Assessment & Plan:   Problem List Items Addressed This Visit     Abnormal liver function tests    Found to have elevated alkaline phos.  Evaluated by GI.  Follow liver panel.  Recent fibroscan - no fibrosis.       Aortic dilatation ([facility])    Followed by cardiology.  Chronic back pain    Followed by pain clinic/[facility].  Stable.       Fatigue    Fatigue and weakness as outlined.  Recently evaluated by dermatology and in ER.  Also being followed by rheumatology.  Planning MRI femur - muscle biopsy.  Labs reviewed.  Keep f/u appt 01/19/21.        History of pancreatitis    No pain.  Eating.  No nausea or vomiting.  Follow.       Hypercholesteremia    Low cholesterol diet and exercise.  Continue crestor.  Check lipid panel and liver function tests.        Hypertension    Continue amlodipine.  Follow pressures. Blood pressure as outlined. No changes.        Rheumatoid arthritis [facility])    Being followed by rheumatology.  Started on humira.  Discussed  the need to contact rheumatology to see if he should take humira - prior to his evaluation on 01/19/21.          Einar Pheasant, MD

## 2021-01-11 NOTE — Assessment & Plan Note (Signed)
Followed by cardiology 

## 2021-01-11 NOTE — Assessment & Plan Note (Signed)
No pain.  Eating.  No nausea or vomiting.  Follow.  

## 2021-01-11 NOTE — Assessment & Plan Note (Signed)
Continue amlodipine.  Follow pressures. Blood pressure as outlined. No changes.

## 2021-01-11 NOTE — Assessment & Plan Note (Signed)
Being followed by rheumatology.  Started on humira.  Discussed the need to contact rheumatology to see if he should take humira - prior to his evaluation on 01/19/21.

## 2021-01-11 NOTE — Assessment & Plan Note (Signed)
Followed by pain clinic/[facility].  Stable.  

## 2021-01-14 DIAGNOSIS — M3390 Dermatopolymyositis, unspecified, organ involvement unspecified: Secondary | ICD-10-CM | POA: Diagnosis not present

## 2021-01-15 DIAGNOSIS — R059 Cough, unspecified: Secondary | ICD-10-CM | POA: Diagnosis not present

## 2021-01-15 DIAGNOSIS — R0981 Nasal congestion: Secondary | ICD-10-CM | POA: Diagnosis not present

## 2021-01-15 DIAGNOSIS — R52 Pain, unspecified: Secondary | ICD-10-CM | POA: Diagnosis not present

## 2021-01-15 DIAGNOSIS — J069 Acute upper respiratory infection, unspecified: Secondary | ICD-10-CM | POA: Diagnosis not present

## 2021-01-19 DIAGNOSIS — R899 Unspecified abnormal finding in specimens from other organs, systems and tissues: Secondary | ICD-10-CM | POA: Diagnosis not present

## 2021-01-19 DIAGNOSIS — L270 Generalized skin eruption due to drugs and medicaments taken internally: Secondary | ICD-10-CM | POA: Diagnosis not present

## 2021-01-19 DIAGNOSIS — T50905A Adverse effect of unspecified drugs, medicaments and biological substances, initial encounter: Secondary | ICD-10-CM | POA: Diagnosis not present

## 2021-01-19 DIAGNOSIS — R768 Other specified abnormal immunological findings in serum: Secondary | ICD-10-CM | POA: Diagnosis not present

## 2021-01-22 DIAGNOSIS — R059 Cough, unspecified: Secondary | ICD-10-CM | POA: Diagnosis not present

## 2021-01-22 DIAGNOSIS — J22 Unspecified acute lower respiratory infection: Secondary | ICD-10-CM | POA: Diagnosis not present

## 2021-02-08 DIAGNOSIS — H524 Presbyopia: Secondary | ICD-10-CM | POA: Diagnosis not present

## 2021-02-09 ENCOUNTER — Encounter: Payer: Self-pay | Admitting: Internal Medicine

## 2021-02-09 DIAGNOSIS — F439 Reaction to severe stress, unspecified: Secondary | ICD-10-CM

## 2021-02-10 NOTE — Telephone Encounter (Signed)
Patient concerned about DHEA 72 from [place] , experiencing fatigue , feels exhausted. Asking for adderall, last OV 01/11/21.

## 2021-02-10 NOTE — Telephone Encounter (Signed)
Reviewed his chart.  Patient is on prednisone 10 mg. Exogenous steroid administration including prednisone reduces a specific hormone level in his body (ACTH).   This reduction in ACTH level can reduce synthesis of all adrenal glucocorticoids including DHEA-S and low DHEA-S level is expected in patients taking prednisone.  Rheumatology following and prescribing and dosing prednisone.  Also, regarding adderall, I have never prescribed this to him.  Given his issues, I would like to schedule him with psychiatry for further evaluation and then they can best determine treatment needed.

## 2021-02-10 NOTE — Telephone Encounter (Signed)
Patient agreed with letting Rheumatology follow DHEA-S and he ask if PCP would refer him to [facility] Psychiatry he prefers to go to [place].

## 2021-02-10 NOTE — Telephone Encounter (Signed)
Order for referral placed - referral to psychiatry at [facility].

## 2021-02-17 ENCOUNTER — Encounter: Payer: Self-pay | Admitting: Internal Medicine

## 2021-02-17 DIAGNOSIS — R768 Other specified abnormal immunological findings in serum: Secondary | ICD-10-CM | POA: Diagnosis not present

## 2021-02-17 DIAGNOSIS — R748 Abnormal levels of other serum enzymes: Secondary | ICD-10-CM | POA: Diagnosis not present

## 2021-02-22 NOTE — Telephone Encounter (Signed)
Patient stated that he is doing better. He has narrowed it down to a protein bar that he was eating. He has already scheduled an appt with [facility] and is going to keep appt. Advised patient to let us know if he needs anything. Pt thanked me for the call.

## 2021-03-01 DIAGNOSIS — L270 Generalized skin eruption due to drugs and medicaments taken internally: Secondary | ICD-10-CM | POA: Diagnosis not present

## 2021-03-01 DIAGNOSIS — L308 Other specified dermatitis: Secondary | ICD-10-CM | POA: Diagnosis not present

## 2021-03-02 NOTE — Telephone Encounter (Signed)
I am ok with him stopping protonix.  If needs something for acid reflux, can try pepcid 20mg  q day.  Will need to let us know if any problems

## 2021-03-04 ENCOUNTER — Encounter: Payer: Self-pay | Admitting: Gastroenterology

## 2021-03-04 NOTE — H&P (Signed)
Pre-Procedure H&P   Patient ID: [name] is a 61 y.o. male.  Gastroenterology Provider: Annamaria Helling, DO  Referring Provider: Laurine Blazer, PA PCP: Eddie Pheasant, MD  Date: 03/05/2021  HPI [name] is a 61 y.o. male who presents today for Colonoscopy for Surveillance colonoscopy; family history of colon polyps. Patient with rheumatoid arthritis and Humira currently.  History of pancreatitis.  Well-controlled acid reflux on Protonix and Carafate. Currently with daily bowel movement-normal without melena or hematochezia.  No family history of colorectal cancer.  Positive family history for colon polyps-mother and multiple relatives on his mother's side.  Hemoglobin 15 MCV 88 platelets 320,000. 2017 colonoscopy demonstrating left-sided diverticulosis with internal hemorrhoids.  Random biopsies negative for microscopic colitis. Status postcholecystectomy  Past Medical History:  Diagnosis Date   Allergy    Anoxic brain injury [facility])    Arthritis    Awareness under anesthesia    need a lot to put me under   Chronic back pain    s/p 5 back surgeries   Coronary artery disease    Cardiac catheterization in 2007 at [facility] showed 30% stenosis in proximal LAD. No other disease. Ejection fraction was 65%   Depression    H/O acute pancreatitis    Hypercholesterolemia    Hypertension    Mild cognitive impairment with memory loss    Pancreatitis     Past Surgical History:  Procedure Laterality Date   abdomial surgery     BACK SURGERY     multiple back surgeries (5)   CARDIAC CATHETERIZATION     [facility]    CHOLECYSTECTOMY  2013   COLONOSCOPY WITH PROPOFOL N/A 06/30/2015   Procedure: COLONOSCOPY WITH PROPOFOL;  Surgeon: Lollie Sails, MD;  Location: [facility] ENDOSCOPY;  Service: Endoscopy;  Laterality: N/A;   ESOPHAGOGASTRODUODENOSCOPY (EGD) WITH PROPOFOL N/A 03/30/2016   Procedure: ESOPHAGOGASTRODUODENOSCOPY (EGD) WITH PROPOFOL;  Surgeon: Manya Silvas, MD;   Location: [facility] ENDOSCOPY;  Service: Endoscopy;  Laterality: N/A;   FUNCTIONAL ENDOSCOPIC SINUS SURGERY     JOINT REPLACEMENT     LAMINECTOMY     LUMBAR SPINE SURGERY     SHOULDER ARTHROSCOPY WITH ROTATOR CUFF REPAIR AND OPEN BICEPS TENODESIS Right    SHOULDER ARTHROSCOPY WITH ROTATOR CUFF REPAIR AND OPEN BICEPS TENODESIS     SPINE SURGERY     TRIGGER POINT INJECTION     VASECTOMY     With Reversal   VEIN SURGERY Right 2013   arm    Family History Mother-colon polyps No h/o GI disease or malignancy  Review of Systems  Constitutional:  Negative for activity change, appetite change, chills, diaphoresis, fatigue, fever and unexpected weight change.  HENT:  Negative for trouble swallowing and voice change.   Respiratory:  Negative for shortness of breath and wheezing.   Cardiovascular:  Negative for chest pain, palpitations and leg swelling.  Gastrointestinal:  Negative for abdominal distention, abdominal pain, anal bleeding, blood in stool, constipation, diarrhea, nausea and vomiting.  Musculoskeletal:  Negative for arthralgias and myalgias.  Skin:  Negative for color change and pallor.  Neurological:  Negative for dizziness, syncope and weakness.  Psychiatric/Behavioral:  Negative for confusion. The patient is not nervous/anxious.   All other systems reviewed and are negative.   Medications No current facility-administered medications on file prior to encounter.   Current Outpatient Medications on File Prior to Encounter  Medication Sig Dispense Refill   fluticasone (FLONASE) 50 MCG/ACT nasal spray Place 1 spray into both  nostrils 2 (two) times daily. 16 g 0   gabapentin (NEURONTIN) 300 MG capsule Take 1 or 2 capsules by mouth TID     modafinil (PROVIGIL) 200 MG tablet Take 400 mg by mouth in the morning.     Multiple Vitamin (MULTIVITAMIN) tablet Take 1 tablet by mouth daily.     pantoprazole (PROTONIX) 40 MG tablet Take 40 mg by mouth 2 (two) times daily.     rosuvastatin  (CRESTOR) 5 MG tablet TAKE 1 TABLET BY MOUTH EVERY DAY 90 tablet 1   Adalimumab 40 MG/0.4ML PNKT Inject 40 mg into the skin every 14 (fourteen) days.     aspirin 81 MG chewable tablet Chew 81 mg by mouth daily as needed for moderate pain.  (Patient not taking: Reported on 03/05/2021)     atomoxetine (STRATTERA) 100 MG capsule Take 100 mg by mouth daily.     doxycycline (VIBRAMYCIN) 100 MG capsule Take 1 capsule (100 mg total) by mouth 2 (two) times daily. 20 capsule 0   hydroxychloroquine (PLAQUENIL) 200 MG tablet Take by mouth.     oxyCODONE (OXY IR/ROXICODONE) 5 MG immediate release tablet Take 5-10 mg by mouth at bedtime as needed for moderate pain.   0   sildenafil (REVATIO) 20 MG tablet TAKE 4 - 5 TABLETS BY MOUTH DAILY AS NEEDED  3   sucralfate (CARAFATE) 1 g tablet Take 1 g by mouth daily.   1   tamsulosin (FLOMAX) 0.4 MG CAPS capsule Take 0.4 mg by mouth daily.     tiZANidine (ZANAFLEX) 4 MG tablet TAKE 1 - 2 TABLETS BY MOUTH THREE TIMES DAILY AS NEEDED     triamcinolone cream (KENALOG) 0.1 % Apply 1 application topically 2 (two) times daily as needed. 30 g 0    Pertinent medications related to GI and procedure were reviewed by me with the patient prior to the procedure   Current Facility-Administered Medications:    0.9 %  sodium chloride infusion, , Intravenous, Continuous, Eddie Helling, DO, Last Rate: 20 mL/hr at 03/05/21 0818, New Bag at 03/05/21 0818      Allergies  Allergen Reactions   Corticosteroids Other (See Comments)    Pancreatitis   Penicillins Anaphylaxis    Has patient had a PCN reaction causing immediate rash, facial/tongue/throat swelling, SOB or lightheadedness with hypotension: no Has patient had a PCN reaction causing severe rash involving mucus membranes or skin necrosis: yes Has patient had a PCN reaction that required hospitalization: yes Has patient had a PCN reaction occurring within the last 10 years: no If all of the above answers are "NO",  then may proceed with Cephalosporin use.    Sulfa Antibiotics Other (See Comments)    Unknown reaction   Sulfasalazine    Plaquenil [Hydroxychloroquine] Rash   Allergies were reviewed by me prior to the procedure  Objective    Vitals:   03/05/21 0759  BP: 135/90  Pulse: 95  Resp: 17  Temp: (!) 97.3 F (36.3 C)  TempSrc: Temporal  SpO2: 100%  Weight: 100.2 kg  Height: 6\' 1"  (1.854 m)     Physical Exam Vitals and nursing note reviewed.  Constitutional:      General: He is not in acute distress.    Appearance: Normal appearance. He is not ill-appearing, toxic-appearing or diaphoretic.  HENT:     Head: Normocephalic and atraumatic.     Nose: Nose normal.     Mouth/Throat:     Mouth: Mucous membranes are moist.  Pharynx: Oropharynx is clear.  Eyes:     General: No scleral icterus.    Extraocular Movements: Extraocular movements intact.  Cardiovascular:     Rate and Rhythm: Normal rate and regular rhythm.     Heart sounds: Normal heart sounds. No murmur heard.   No friction rub. No gallop.  Pulmonary:     Effort: Pulmonary effort is normal. No respiratory distress.     Breath sounds: Normal breath sounds. No wheezing, rhonchi or rales.  Abdominal:     General: Bowel sounds are normal. There is no distension.     Palpations: Abdomen is soft.     Tenderness: There is no abdominal tenderness. There is no guarding or rebound.  Musculoskeletal:     Cervical back: Neck supple.     Right lower leg: No edema.     Left lower leg: No edema.  Skin:    General: Skin is warm and dry.     Coloration: Skin is not jaundiced or pale.  Neurological:     General: No focal deficit present.     Mental Status: He is alert and oriented to person, place, and time. Mental status is at baseline.  Psychiatric:        Mood and Affect: Mood normal.        Behavior: Behavior normal.        Thought Content: Thought content normal.        Judgment: Judgment normal.     Assessment:   [name] is a 61 y.o. male  who presents today for Colonoscopy for Surveillance colonoscopy; family history of colon polyps.  Plan:  Colonoscopy with possible intervention today  Colonoscopy with possible biopsy, control of bleeding, polypectomy, and interventions as necessary has been discussed with the patient/patient representative. Informed consent was obtained from the patient/patient representative after explaining the indication, nature, and risks of the procedure including but not limited to death, bleeding, perforation, missed neoplasm/lesions, cardiorespiratory compromise, and reaction to medications. Opportunity for questions was given and appropriate answers were provided. Patient/patient representative has verbalized understanding is amenable to undergoing the procedure.   Eddie Helling, DO  [facility]  Portions of the record may have been created with voice recognition software. Occasional wrong-word or 'sound-a-like' substitutions may have occurred due to the inherent limitations of voice recognition software.  Read the chart carefully and recognize, using context, where substitutions may have occurred.

## 2021-03-05 ENCOUNTER — Ambulatory Visit: Payer: Medicare HMO | Admitting: Anesthesiology

## 2021-03-05 ENCOUNTER — Encounter: Payer: Self-pay | Admitting: Gastroenterology

## 2021-03-05 ENCOUNTER — Ambulatory Visit
Admission: RE | Admit: 2021-03-05 | Discharge: 2021-03-05 | Disposition: A | Discharge status: Home / Self Care | Payer: Medicare HMO | Attending: Gastroenterology | Admitting: Gastroenterology

## 2021-03-05 ENCOUNTER — Encounter
Admission: RE | Disposition: A | Discharge status: Home / Self Care | Payer: Self-pay | Source: Home / Self Care | Attending: Gastroenterology

## 2021-03-05 DIAGNOSIS — K5730 Diverticulosis of large intestine without perforation or abscess without bleeding: Secondary | ICD-10-CM | POA: Insufficient documentation

## 2021-03-05 DIAGNOSIS — I10 Essential (primary) hypertension: Secondary | ICD-10-CM | POA: Insufficient documentation

## 2021-03-05 DIAGNOSIS — K219 Gastro-esophageal reflux disease without esophagitis: Secondary | ICD-10-CM | POA: Diagnosis not present

## 2021-03-05 DIAGNOSIS — I2510 Atherosclerotic heart disease of native coronary artery without angina pectoris: Secondary | ICD-10-CM | POA: Insufficient documentation

## 2021-03-05 DIAGNOSIS — K640 First degree hemorrhoids: Secondary | ICD-10-CM | POA: Insufficient documentation

## 2021-03-05 DIAGNOSIS — R0600 Dyspnea, unspecified: Secondary | ICD-10-CM | POA: Insufficient documentation

## 2021-03-05 DIAGNOSIS — Z8371 Family history of colonic polyps: Secondary | ICD-10-CM | POA: Insufficient documentation

## 2021-03-05 DIAGNOSIS — D649 Anemia, unspecified: Secondary | ICD-10-CM | POA: Diagnosis not present

## 2021-03-05 DIAGNOSIS — M069 Rheumatoid arthritis, unspecified: Secondary | ICD-10-CM | POA: Insufficient documentation

## 2021-03-05 DIAGNOSIS — F32A Depression, unspecified: Secondary | ICD-10-CM | POA: Diagnosis not present

## 2021-03-05 DIAGNOSIS — Z8719 Personal history of other diseases of the digestive system: Secondary | ICD-10-CM | POA: Insufficient documentation

## 2021-03-05 DIAGNOSIS — Z79899 Other long term (current) drug therapy: Secondary | ICD-10-CM | POA: Diagnosis not present

## 2021-03-05 DIAGNOSIS — Z1211 Encounter for screening for malignant neoplasm of colon: Secondary | ICD-10-CM | POA: Diagnosis not present

## 2021-03-05 DIAGNOSIS — K649 Unspecified hemorrhoids: Secondary | ICD-10-CM | POA: Diagnosis not present

## 2021-03-05 DIAGNOSIS — E7800 Pure hypercholesterolemia, unspecified: Secondary | ICD-10-CM | POA: Diagnosis not present

## 2021-03-05 DIAGNOSIS — D759 Disease of blood and blood-forming organs, unspecified: Secondary | ICD-10-CM | POA: Diagnosis not present

## 2021-03-05 HISTORY — PX: COLONOSCOPY: SHX5424

## 2021-03-05 HISTORY — DX: Unintended awareness under general anesthesia during procedure, initial encounter: T88.53XA

## 2021-03-05 HISTORY — DX: Unspecified osteoarthritis, unspecified site: M19.90

## 2021-03-05 SURGERY — COLONOSCOPY
Anesthesia: General

## 2021-03-05 MED ORDER — PROPOFOL 500 MG/50ML IV EMUL
INTRAVENOUS | Status: AC
  Filled 2021-03-05: qty 50

## 2021-03-05 MED ORDER — SODIUM CHLORIDE 0.9 % IV SOLN: INTRAVENOUS | Status: DC

## 2021-03-05 MED ORDER — PHENYLEPHRINE HCL (PRESSORS) 10 MG/ML IV SOLN
INTRAVENOUS | Status: AC
  Filled 2021-03-05: qty 1

## 2021-03-05 MED ORDER — PROPOFOL 500 MG/50ML IV EMUL
INTRAVENOUS | Status: DC | PRN
  Administered 2021-03-05: 150 ug/kg/min via INTRAVENOUS

## 2021-03-05 NOTE — Transfer of Care (Signed)
Immediate Anesthesia Transfer of Care Note  Patient: [name]  Procedure(s) Performed: COLONOSCOPY  Patient Location: PACU  Anesthesia Type:General  Level of Consciousness: awake and sedated  Airway & Oxygen Therapy: Patient Spontanous Breathing and Patient connected to nasal cannula oxygen  Post-op Assessment: Report given to RN and Post -op Vital signs reviewed and stable  Post vital signs: Reviewed and stable  Last Vitals:  Vitals Value Taken Time  BP    Temp    Pulse    Resp    SpO2      Last Pain:  Vitals:   03/05/21 0759  TempSrc: Temporal  PainSc: 0-No pain         Complications: No notable events documented.

## 2021-03-05 NOTE — Anesthesia Postprocedure Evaluation (Signed)
Anesthesia Post Note  Patient: [name]  Procedure(s) Performed: COLONOSCOPY  Patient location during evaluation: PACU Anesthesia Type: General Level of consciousness: awake and awake and alert Pain management: pain level controlled Vital Signs Assessment: post-procedure vital signs reviewed and stable Respiratory status: spontaneous breathing and nonlabored ventilation Cardiovascular status: stable Anesthetic complications: no   No notable events documented.   Last Vitals:  Vitals:   03/05/21 0930 03/05/21 0940  BP: (!) 108/56 (!) 108/56  Pulse: 79 72  Resp: 13 13  Temp: (!) 36.3 C   SpO2: 94% 94%    Last Pain:  Vitals:   03/05/21 0930  TempSrc: Temporal  PainSc:                  VAN STAVEREN,Jimia Gentles

## 2021-03-05 NOTE — Op Note (Signed)
[facility] Patient Name: [name] Procedure Date: 03/05/2021 8:38 AM MRN: [id number] Account #: [id number] Date of Birth: 1960/06/27 Admit Type: Outpatient Age: 61 Room: [facility] ENDO ROOM 1 Gender: Male Note Status: Finalized Instrument Name: Jasper Riling [id number] Procedure:             Colonoscopy Indications:           Colon cancer screening in patient at increased risk:                         Family history of colon polyps in multiple 1st-degree                         relatives Providers:             Annamaria Helling DO, DO Medicines:             Monitored Anesthesia Care Complications:         No immediate complications. Estimated blood loss: None. Procedure:             Pre-Anesthesia Assessment:                        - Prior to the procedure, a History and Physical was                         performed, and patient medications and allergies were                         reviewed. The patient is competent. The risks and                         benefits of the procedure and the sedation options and                         risks were discussed with the patient. All questions                         were answered and informed consent was obtained.                         Patient identification and proposed procedure were                         verified by the physician, the nurse, the anesthetist                         and the technician in the endoscopy suite. Mental                         Status Examination: alert and oriented. Airway                         Examination: normal oropharyngeal airway and neck                         mobility. Respiratory Examination: clear to                         auscultation. CV Examination: RRR,  no murmurs, no S3                         or S4. Prophylactic Antibiotics: The patient does not                         require prophylactic antibiotics. Prior                         Anticoagulants: The  patient has taken no previous                         anticoagulant or antiplatelet agents. ASA Grade                         Assessment: II - A patient with mild systemic disease.                         After reviewing the risks and benefits, the patient                         was deemed in satisfactory condition to undergo the                         procedure. The anesthesia plan was to use monitored                         anesthesia care (MAC). Immediately prior to                         administration of medications, the patient was                         re-assessed for adequacy to receive sedatives. The                         heart rate, respiratory rate, oxygen saturations,                         blood pressure, adequacy of pulmonary ventilation, and                         response to care were monitored throughout the                         procedure. The physical status of the patient was                         re-assessed after the procedure.                        After obtaining informed consent, the colonoscope was                         passed under direct vision. Throughout the procedure,                         the patient's blood pressure, pulse, and oxygen  saturations were monitored continuously. The                         Colonoscope was introduced through the anus and                         advanced to the the terminal ileum, with                         identification of the appendiceal orifice and IC                         valve. The colonoscopy was performed without                         difficulty. The patient tolerated the procedure well.                         The quality of the bowel preparation was evaluated                         using the BBPS [facility] Bowel Preparation Scale) with                         scores of: Right Colon = 3, Transverse Colon = 3 and                         Left Colon = 3 (entire mucosa seen well  with no                         residual staining, small fragments of stool or opaque                         liquid). The total BBPS score equals 9. The terminal                         ileum, ileocecal valve, appendiceal orifice, and                         rectum were photographed. Findings:      The perianal and digital rectal examinations were normal. Pertinent       negatives include normal sphincter tone.      The terminal ileum appeared normal. Estimated blood loss: none.      A few small-mouthed diverticula were found in the recto-sigmoid colon.       Estimated blood loss: none.      Non-bleeding internal hemorrhoids were found during retroflexion. The       hemorrhoids were Grade I (internal hemorrhoids that do not prolapse).       Estimated blood loss: none.      The exam was otherwise without abnormality on direct and retroflexion       views. Impression:            - The examined portion of the ileum was normal.                        - Diverticulosis in the recto-sigmoid colon.                        -  Non-bleeding internal hemorrhoids.                        - The examination was otherwise normal on direct and                         retroflexion views.                        - No specimens collected. Recommendation:        - Discharge patient to home.                        - Resume previous diet.                        - Continue present medications.                        - Repeat colonoscopy in 5 years for screening purposes.                        - Return to referring physician as previously                         scheduled. Procedure Code(s):     --- Professional ---                        [phone], Colonoscopy, flexible; diagnostic, including                         collection of specimen(s) by brushing or washing, when                         performed (separate procedure) Diagnosis Code(s):     --- Professional ---                        Z83.71, Family history  of colonic polyps                        K64.0, First degree hemorrhoids                        K57.30, Diverticulosis of large intestine without                         perforation or abscess without bleeding CPT copyright 2019 American Medical Association. All rights reserved. The codes documented in this report are preliminary and upon coder review may  be revised to meet current compliance requirements. Attending Participation:      I personally performed the entire procedure. Volney American, DO Annamaria Helling DO, DO 03/05/2021 9:34:33 AM This report has been signed electronically. Number of Addenda: 0 Note Initiated On: 03/05/2021 8:38 AM Scope Withdrawal Time: 0 hours 14 minutes 11 seconds  Total Procedure Duration: 0 hours 27 minutes 34 seconds  Estimated Blood Loss:  Estimated blood loss: none.      [facility]

## 2021-03-05 NOTE — Interval H&P Note (Signed)
History and Physical Interval Note: Preprocedure H&P from 03/05/21  was reviewed and there was no interval change after seeing and examining the patient.  Written consent was obtained from the patient after discussion of risks, benefits, and alternatives. Patient has consented to proceed with Colonoscopy with possible intervention   03/05/2021 8:51 AM  [name]  has presented today for surgery, with the diagnosis of Family hx colonic polyps (Z83.71).  The various methods of treatment have been discussed with the patient and family. After consideration of risks, benefits and other options for treatment, the patient has consented to  Procedure(s) with comments: COLONOSCOPY (N/A) - IDDM as a surgical intervention.  The patient's history has been reviewed, patient examined, no change in status, stable for surgery.  I have reviewed the patient's chart and labs.  Questions were answered to the patient's satisfaction.     Annamaria Helling

## 2021-03-05 NOTE — Anesthesia Postprocedure Evaluation (Signed)
Anesthesia Post Note  Patient: [name]  Procedure(s) Performed: COLONOSCOPY  Patient location during evaluation: PACU Anesthesia Type: General Level of consciousness: awake and awake and alert Pain management: satisfactory to patient Vital Signs Assessment: post-procedure vital signs reviewed and stable Respiratory status: spontaneous breathing and nonlabored ventilation Cardiovascular status: stable Anesthetic complications: no   No notable events documented.   Last Vitals:  Vitals:   03/05/21 0930 03/05/21 0940  BP: (!) 108/56 (!) 108/56  Pulse: 79 72  Resp: 13 13  Temp: (!) 36.3 C   SpO2: 94% 94%    Last Pain:  Vitals:   03/05/21 0930  TempSrc: Temporal  PainSc:                  VAN STAVEREN,Jalil Lorusso

## 2021-03-05 NOTE — Anesthesia Preprocedure Evaluation (Signed)
Anesthesia Evaluation  Patient identified by MRN, date of birth, ID band Patient awake    Reviewed: Allergy & Precautions, NPO status , Patient's Chart, lab work & pertinent test results  Airway Mallampati: II  TM Distance: >3 FB Neck ROM: full    Dental  (+) Teeth Intact   Pulmonary neg pulmonary ROS,    Pulmonary exam normal  + decreased breath sounds      Cardiovascular Exercise Tolerance: Good hypertension, Pt. on medications + CAD and + DOE  negative cardio ROS Normal cardiovascular exam Rhythm:Regular     Neuro/Psych Depression negative neurological ROS  negative psych ROS   GI/Hepatic negative GI ROS, Neg liver ROS,   Endo/Other  negative endocrine ROS  Renal/GU negative Renal ROS  negative genitourinary   Musculoskeletal  (+) Arthritis ,   Abdominal Normal abdominal exam  (+)   Peds negative pediatric ROS (+)  Hematology negative hematology ROS (+) Blood dyscrasia, anemia ,   Anesthesia Other Findings Past Medical History: No date: Allergy No date: Anoxic brain injury ([place]) No date: Arthritis No date: Awareness under anesthesia     Comment:  need a lot to put me under No date: Chronic back pain     Comment:  s/p 5 back surgeries No date: Coronary artery disease     Comment:  Cardiac catheterization in 2007 at [facility] showed 30%               stenosis in proximal LAD. No other disease. Ejection               fraction was 65% No date: Depression No date: H/O acute pancreatitis No date: Hypercholesterolemia No date: Hypertension No date: Mild cognitive impairment with memory loss No date: Pancreatitis  Past Surgical History: No date: abdomial surgery No date: BACK SURGERY     Comment:  multiple back surgeries (5) No date: CARDIAC CATHETERIZATION     Comment:  [facility]  2013: CHOLECYSTECTOMY 06/30/2015: COLONOSCOPY WITH PROPOFOL; N/A     Comment:  Procedure: COLONOSCOPY WITH PROPOFOL;  Surgeon: Lollie Sails, MD;  Location: [facility] ENDOSCOPY;  Service:               Endoscopy;  Laterality: N/A; 03/30/2016: ESOPHAGOGASTRODUODENOSCOPY (EGD) WITH PROPOFOL; N/A     Comment:  Procedure: ESOPHAGOGASTRODUODENOSCOPY (EGD) WITH               PROPOFOL;  Surgeon: Manya Silvas, MD;  Location: [facility]              ENDOSCOPY;  Service: Endoscopy;  Laterality: N/A; No date: FUNCTIONAL ENDOSCOPIC SINUS SURGERY No date: JOINT REPLACEMENT No date: LAMINECTOMY No date: LUMBAR SPINE SURGERY No date: SHOULDER ARTHROSCOPY WITH ROTATOR CUFF REPAIR AND OPEN  BICEPS TENODESIS; Right No date: SHOULDER ARTHROSCOPY WITH ROTATOR CUFF REPAIR AND OPEN  BICEPS TENODESIS No date: SPINE SURGERY No date: TRIGGER POINT INJECTION No date: VASECTOMY     Comment:  With Reversal 2013: VEIN SURGERY; Right     Comment:  arm     Reproductive/Obstetrics negative OB ROS                             Anesthesia Physical Anesthesia Plan  ASA: 3  Anesthesia Plan: General   Post-op Pain Management:    Induction: Intravenous  PONV Risk Score and Plan: Propofol infusion and TIVA  Airway  Management Planned: Natural Airway and Nasal Cannula  Additional Equipment:   Intra-op Plan:   Post-operative Plan:   Informed Consent: I have reviewed the patients History and Physical, chart, labs and discussed the procedure including the risks, benefits and alternatives for the proposed anesthesia with the patient or authorized representative who has indicated his/her understanding and acceptance.     Dental Advisory Given  Plan Discussed with: CRNA and Surgeon  Anesthesia Plan Comments:         Anesthesia Quick Evaluation

## 2021-03-05 NOTE — Anesthesia Procedure Notes (Signed)
Date/Time: 03/05/2021 9:06 AM Performed by: Vaughan Sine Pre-anesthesia Checklist: Patient identified, Emergency Drugs available, Suction available, Patient being monitored and Timeout performed Patient Re-evaluated:Patient Re-evaluated prior to induction Oxygen Delivery Method: Nasal cannula Preoxygenation: Pre-oxygenation with 100% oxygen Induction Type: IV induction Placement Confirmation: positive ETCO2 and CO2 detector

## 2021-03-08 ENCOUNTER — Encounter: Payer: Self-pay | Admitting: Gastroenterology

## 2021-03-10 DIAGNOSIS — L308 Other specified dermatitis: Secondary | ICD-10-CM | POA: Diagnosis not present

## 2021-03-19 DIAGNOSIS — M059 Rheumatoid arthritis with rheumatoid factor, unspecified: Secondary | ICD-10-CM | POA: Diagnosis not present

## 2021-03-19 DIAGNOSIS — L270 Generalized skin eruption due to drugs and medicaments taken internally: Secondary | ICD-10-CM | POA: Diagnosis not present

## 2021-04-14 ENCOUNTER — Other Ambulatory Visit: Payer: Self-pay

## 2021-04-14 ENCOUNTER — Ambulatory Visit (INDEPENDENT_AMBULATORY_CARE_PROVIDER_SITE_OTHER): Payer: Medicare HMO | Admitting: Internal Medicine

## 2021-04-14 ENCOUNTER — Other Ambulatory Visit: Payer: Self-pay | Admitting: Internal Medicine

## 2021-04-14 VITALS — BP 128/74 | HR 100 | Temp 98.1°F | Resp 16 | Ht 73.0 in | Wt 212.0 lb

## 2021-04-14 DIAGNOSIS — G8929 Other chronic pain: Secondary | ICD-10-CM

## 2021-04-14 DIAGNOSIS — D649 Anemia, unspecified: Secondary | ICD-10-CM | POA: Diagnosis not present

## 2021-04-14 DIAGNOSIS — I10 Essential (primary) hypertension: Secondary | ICD-10-CM | POA: Diagnosis not present

## 2021-04-14 DIAGNOSIS — E7800 Pure hypercholesterolemia, unspecified: Secondary | ICD-10-CM

## 2021-04-14 DIAGNOSIS — M069 Rheumatoid arthritis, unspecified: Secondary | ICD-10-CM | POA: Diagnosis not present

## 2021-04-14 DIAGNOSIS — R69 Illness, unspecified: Secondary | ICD-10-CM | POA: Diagnosis not present

## 2021-04-14 DIAGNOSIS — I77819 Aortic ectasia, unspecified site: Secondary | ICD-10-CM | POA: Diagnosis not present

## 2021-04-14 DIAGNOSIS — G479 Sleep disorder, unspecified: Secondary | ICD-10-CM | POA: Diagnosis not present

## 2021-04-14 DIAGNOSIS — R7989 Other specified abnormal findings of blood chemistry: Secondary | ICD-10-CM | POA: Diagnosis not present

## 2021-04-14 DIAGNOSIS — R7689 Other specified abnormal immunological findings in serum: Secondary | ICD-10-CM

## 2021-04-14 DIAGNOSIS — R21 Rash and other nonspecific skin eruption: Secondary | ICD-10-CM | POA: Diagnosis not present

## 2021-04-14 DIAGNOSIS — R768 Other specified abnormal immunological findings in serum: Secondary | ICD-10-CM | POA: Diagnosis not present

## 2021-04-14 DIAGNOSIS — F320 Major depressive disorder, single episode, mild: Secondary | ICD-10-CM

## 2021-04-14 DIAGNOSIS — M545 Low back pain, unspecified: Secondary | ICD-10-CM

## 2021-04-14 LAB — HEPATIC FUNCTION PANEL
ALT: 25 U/L (ref 0–53)
AST: 23 U/L (ref 0–37)
Albumin: 4.8 g/dL (ref 3.5–5.2)
Alkaline Phosphatase: 139 U/L — ABNORMAL HIGH (ref 39–117)
Bilirubin, Direct: 0.1 mg/dL (ref 0.0–0.3)
Total Bilirubin: 0.5 mg/dL (ref 0.2–1.2)
Total Protein: 8.1 g/dL (ref 6.0–8.3)

## 2021-04-14 LAB — BASIC METABOLIC PANEL
BUN: 18 mg/dL (ref 6–23)
CO2: 27 mEq/L (ref 19–32)
Calcium: 10.1 mg/dL (ref 8.4–10.5)
Chloride: 102 mEq/L (ref 96–112)
Creatinine, Ser: 1.05 mg/dL (ref 0.40–1.50)
GFR: 78.17 mL/min (ref >60.00)
Glucose, Bld: 102 mg/dL — ABNORMAL HIGH (ref 70–99)
Potassium: 5.1 mEq/L (ref 3.5–5.1)
Sodium: 136 mEq/L (ref 135–145)

## 2021-04-14 LAB — TSH: TSH: 1.63 u[IU]/mL (ref 0.35–5.50)

## 2021-04-14 MED ORDER — TRAZODONE HCL 50 MG PO TABS: ORAL_TABLET | ORAL | 0 refills | Status: DC

## 2021-04-14 NOTE — Progress Notes (Signed)
Patient ID: [name], male   DOB: 08-15-1960, 61 y.o.   MRN: [id number] ? ? ?Subjective:  ? ? Patient ID: [name], male    DOB: [birth year], 61 y.o.   MRN: [id number] ? ?This visit occurred during the SARS-CoV-2 public health emergency.  Safety protocols were in place, including screening questions prior to the visit, additional usage of staff PPE, and extensive cleaning of exam room while observing appropriate contact time as indicated for disinfecting solutions.  ? ?Patient here for a scheduled follow up.  ? ?Chief Complaint  ?Patient presents with  ? Insomnia  ? .  ? ?HPI ?Follow up.  Seeing Dr Ouida Sills (rheumatology) - RA follow up.  Maintained on Humira. Tapered off prednisone.  Prescribed atarax to help with itching. Itching and rash better.  His main complaint is inability to sleep.  Discussed with him today.  Does not sleep during day.  Trouble staying asleep.  Does not take oxycodone at night.  Does take gabapentin.  This helps with his pain, but does not feel helps with his sleep.  No chest pain.  Breathing stable.  No increased cough or congestion.  No increased sob reported.  Does report - burning sensation.  Off protonix - concern causing rash.  Discussed acid reflux.  Bowels stable.  Being followed hepatology clinic.  They have requested hepatitis and liver labs.   ? ? ?Past Medical History:  ?Diagnosis Date  ? Allergy   ? Anoxic brain injury ([place])   ? Arthritis   ? Awareness under anesthesia   ? need a lot to put me under  ? Chronic back pain   ? s/p 5 back surgeries  ? Coronary artery disease   ? Cardiac catheterization in 2007 at [facility] showed 30% stenosis in proximal LAD. No other disease. Ejection fraction was 65%  ? Depression   ? H/O acute pancreatitis   ? Hypercholesterolemia   ? Hypertension   ? Mild cognitive impairment with memory loss   ? Pancreatitis   ? ?Past Surgical History:  ?Procedure Laterality Date  ? abdomial surgery    ? BACK SURGERY    ? multiple back surgeries (5)  ?  CARDIAC CATHETERIZATION    ? [facility]   ? CHOLECYSTECTOMY  2013  ? COLONOSCOPY N/A 03/05/2021  ? Procedure: COLONOSCOPY;  Surgeon: Annamaria Helling, DO;  Location: [facility] ENDOSCOPY;  Service: Gastroenterology;  Laterality: N/A;  IDDM  ? COLONOSCOPY WITH PROPOFOL N/A 06/30/2015  ? Procedure: COLONOSCOPY WITH PROPOFOL;  Surgeon: Lollie Sails, MD;  Location: [facility] ENDOSCOPY;  Service: Endoscopy;  Laterality: N/A;  ? ESOPHAGOGASTRODUODENOSCOPY (EGD) WITH PROPOFOL N/A 03/30/2016  ? Procedure: ESOPHAGOGASTRODUODENOSCOPY (EGD) WITH PROPOFOL;  Surgeon: Manya Silvas, MD;  Location: [facility] ENDOSCOPY;  Service: Endoscopy;  Laterality: N/A;  ? FUNCTIONAL ENDOSCOPIC SINUS SURGERY    ? JOINT REPLACEMENT    ? LAMINECTOMY    ? LUMBAR SPINE SURGERY    ? SHOULDER ARTHROSCOPY WITH ROTATOR CUFF REPAIR AND OPEN BICEPS TENODESIS Right   ? SHOULDER ARTHROSCOPY WITH ROTATOR CUFF REPAIR AND OPEN BICEPS TENODESIS    ? SPINE SURGERY    ? TRIGGER POINT INJECTION    ? VASECTOMY    ? With Reversal  ? VEIN SURGERY Right 2013  ? arm  ? ?Family History  ?Problem Relation Age of Onset  ? Heart disease Father   ? Heart attack Father   ? Heart disease Mother   ? Heart attack Mother   ? Heart attack  Brother   ?     MI's x 2   ? Hypertension Other   ? Colon cancer Other   ? Hypertension Sister   ? Heart attack Paternal Grandfather   ? Hypertension Sister   ? ?Social History  ? ?Socioeconomic History  ? Marital status: Divorced  ?  Spouse name: Not on file  ? Number of children: 3  ? Years of education: Not on file  ? Highest education level: Not on file  ?Occupational History  ? Not on file  ?Tobacco Use  ? Smoking status: Never  ? Smokeless tobacco: Never  ?Vaping Use  ? Vaping Use: Never used  ?Substance and Sexual Activity  ? Alcohol use: No  ?  Alcohol/week: 0.0 standard drinks  ? Drug use: No  ? Sexual activity: Yes  ?Other Topics Concern  ? Not on file  ?Social History Narrative  ? Not on file  ? ?Social Determinants of Health  ? ?Financial  Resource Strain: Low Risk   ? Difficulty of Paying Living Expenses: Not hard at all  ?Food Insecurity: No Food Insecurity  ? Worried About Charity fundraiser in the Last Year: Never true  ? Ran Out of Food in the Last Year: Never true  ?Transportation Needs: No Transportation Needs  ? Lack of Transportation (Medical): No  ? Lack of Transportation (Non-Medical): No  ?Physical Activity: Not on file  ?Stress: No Stress Concern Present  ? Feeling of Stress : Not at all  ?Social Connections: Unknown  ? Frequency of Communication with Friends and Family: More than three times a week  ? Frequency of Social Gatherings with Friends and Family: More than three times a week  ? Attends Religious Services: Not on file  ? Active Member of Clubs or Organizations: Not on file  ? Attends Archivist Meetings: Not on file  ? Marital Status: Not on file  ? ? ? ?Review of Systems  ?Constitutional:  Negative for appetite change and unexpected weight change.  ?HENT:  Negative for congestion and sinus pressure.   ?Respiratory:  Negative for cough, chest tightness and shortness of breath.   ?Cardiovascular:  Negative for chest pain, palpitations and leg swelling.  ?Gastrointestinal:  Negative for abdominal pain, diarrhea, nausea and vomiting.  ?Genitourinary:  Negative for difficulty urinating and dysuria.  ?Musculoskeletal:  Negative for joint swelling and myalgias.  ?Skin:  Negative for color change and rash.  ?Neurological:  Negative for dizziness, light-headedness and headaches.  ?Psychiatric/Behavioral:  Negative for agitation and dysphoric mood.   ? ?   ?Objective:  ?  ? ?BP 128/74   Pulse 100   Temp 98.1 ?F (36.7 ?C)   Resp 16   Ht '6\' 1"'$  (1.854 m)   Wt 212 lb (96.2 kg)   SpO2 98%   BMI 27.97 kg/m?  ?Wt Readings from Last 3 Encounters:  ?04/14/21 212 lb (96.2 kg)  ?03/05/21 221 lb (100.2 kg)  ?01/11/21 215 lb (97.5 kg)  ? ? ?Physical Exam ?Constitutional:   ?   General: He is not in acute distress. ?   Appearance:  Normal appearance. He is well-developed.  ?HENT:  ?   Head: Normocephalic and atraumatic.  ?   Right Ear: External ear normal.  ?   Left Ear: External ear normal.  ?Eyes:  ?   General: No scleral icterus.    ?   Right eye: No discharge.     ?   Left eye: No discharge.  ?  Cardiovascular:  ?   Rate and Rhythm: Normal rate and regular rhythm.  ?Pulmonary:  ?   Effort: Pulmonary effort is normal. No respiratory distress.  ?   Breath sounds: Normal breath sounds.  ?Abdominal:  ?   General: Bowel sounds are normal.  ?   Palpations: Abdomen is soft.  ?   Tenderness: There is no abdominal tenderness.  ?Musculoskeletal:     ?   General: No swelling or tenderness.  ?   Cervical back: Neck supple. No tenderness.  ?Lymphadenopathy:  ?   Cervical: No cervical adenopathy.  ?Skin: ?   Findings: No erythema or rash.  ?Neurological:  ?   Mental Status: He is alert.  ?Psychiatric:     ?   Mood and Affect: Mood normal.     ?   Behavior: Behavior normal.  ? ? ? ?Outpatient Encounter Medications as of 04/14/2021  ?Medication Sig  ? traZODone (DESYREL) 50 MG tablet 1/2 tablet q hs - do not take with Muscle relaxer or oxycodone  ? Adalimumab 40 MG/0.4ML PNKT Inject 40 mg into the skin every 14 (fourteen) days.  ? amLODipine (NORVASC) 5 MG tablet TAKE 1 TABLET BY MOUTH TWICE A DAY  ? atomoxetine (STRATTERA) 100 MG capsule Take 100 mg by mouth daily.  ? fluticasone (FLONASE) 50 MCG/ACT nasal spray Place 1 spray into both nostrils 2 (two) times daily.  ? gabapentin (NEURONTIN) 300 MG capsule Take 1 or 2 capsules by mouth TID  ? hydroxychloroquine (PLAQUENIL) 200 MG tablet Take by mouth.  ? modafinil (PROVIGIL) 200 MG tablet Take 400 mg by mouth in the morning.  ? oxyCODONE (OXY IR/ROXICODONE) 5 MG immediate release tablet Take 5-10 mg by mouth at bedtime as needed for moderate pain.   ? sildenafil (REVATIO) 20 MG tablet TAKE 4 - 5 TABLETS BY MOUTH DAILY AS NEEDED  ? sucralfate (CARAFATE) 1 g tablet Take 1 g by mouth daily.   ? tamsulosin  (FLOMAX) 0.4 MG CAPS capsule Take 0.4 mg by mouth daily.  ? tiZANidine (ZANAFLEX) 4 MG tablet TAKE 1 - 2 TABLETS BY MOUTH THREE TIMES DAILY AS NEEDED  ? triamcinolone cream (KENALOG) 0.1 % Apply 1 application topically 2 (

## 2021-04-18 ENCOUNTER — Encounter: Payer: Self-pay | Admitting: Internal Medicine

## 2021-04-18 DIAGNOSIS — G479 Sleep disorder, unspecified: Secondary | ICD-10-CM | POA: Insufficient documentation

## 2021-04-18 NOTE — Assessment & Plan Note (Signed)
Found to have elevated alkaline phos.  Evaluated by GI.  Currently being followed - hepatology clinic. Recent fibroscan - no fibrosis. Check labs requested by Dr Charlean Sanfilippo (GI - [facility]).  ?

## 2021-04-18 NOTE — Assessment & Plan Note (Signed)
Discussed.  Discussed modifying behavior before bed.  Discussed treatment options.  Feels needs something to help him sleep.  Trouble staying asleep.  Does not take oxycodone at night.  Discussed the importance of not mixing medication and taking multiple medication at the same time.  Discussed possible side effects and risk of medication.  Discussed importance of not taking medication and driving.  Trial of trazodone '50mg'$  (1/2 tablet q hs).  Follow.  Call with update.  ?

## 2021-04-18 NOTE — Assessment & Plan Note (Signed)
Followed by cardiology 

## 2021-04-18 NOTE — Assessment & Plan Note (Signed)
Interface dermatitis - improved with topical steroids.  Question if related to PPI.  Unclear.  Off protonix now.  Follow.  ?

## 2021-04-18 NOTE — Assessment & Plan Note (Signed)
Follow cbc.  

## 2021-04-18 NOTE — Assessment & Plan Note (Signed)
Followed by pain clinic/[facility].  Stable.  

## 2021-04-18 NOTE — Assessment & Plan Note (Signed)
Overall appears to be handling things well.  Has good support. Follow.  

## 2021-04-18 NOTE — Assessment & Plan Note (Signed)
Continue amlodipine.  Follow pressures. Blood pressure as outlined. No changes.   ?

## 2021-04-18 NOTE — Assessment & Plan Note (Signed)
Low cholesterol diet and exercise.  Continue crestor.  Check lipid panel and liver function tests.   

## 2021-04-18 NOTE — Assessment & Plan Note (Signed)
Being followed by rheumatology.  Started on humira.  Stable.  ?

## 2021-04-19 ENCOUNTER — Encounter: Payer: Self-pay | Admitting: Internal Medicine

## 2021-04-19 NOTE — Telephone Encounter (Signed)
See result note. Drawn for Dr Damita Lack ?

## 2021-04-20 ENCOUNTER — Other Ambulatory Visit: Payer: Self-pay

## 2021-04-20 ENCOUNTER — Telehealth: Payer: Self-pay | Admitting: Internal Medicine

## 2021-04-20 ENCOUNTER — Other Ambulatory Visit
Admission: RE | Admit: 2021-04-20 | Discharge: 2021-04-20 | Disposition: A | Discharge status: Home / Self Care | Payer: Medicare HMO | Attending: Internal Medicine | Admitting: Internal Medicine

## 2021-04-20 DIAGNOSIS — E875 Hyperkalemia: Secondary | ICD-10-CM | POA: Diagnosis not present

## 2021-04-20 LAB — MITOCHONDRIAL ANTIBODIES: Mitochondrial M2 Ab, IgG: <=20 U (ref <=20.0)

## 2021-04-20 LAB — POTASSIUM: Potassium: 4.4 mmol/L (ref 3.5–5.1)

## 2021-04-20 LAB — HEPATITIS B CORE ANTIBODY, IGM: Hep B C IgM: REACTIVE — AB

## 2021-04-20 LAB — HEPATITIS B SURFACE ANTIBODY,QUALITATIVE: Hep B S Ab: REACTIVE — AB

## 2021-04-20 LAB — HEPATITIS B SURFACE ANTIGEN: Hepatitis B Surface Ag: NONREACTIVE

## 2021-04-20 LAB — HEPATITIS B DNA, ULTRAQUANTITATIVE, PCR
Hepatitis B DNA (Calc): <1 Log IU/mL
Hepatitis B DNA: <10 IU/mL

## 2021-04-20 LAB — HEPATITIS B CORE ANTIBODY, TOTAL: Hep B Core Total Ab: NONREACTIVE

## 2021-04-20 NOTE — Telephone Encounter (Signed)
[name] from [facility] admitting sad pt is there trying to get labs done and there are no orders put in. Pt said he talked to someone about orders on yesterday. ?

## 2021-04-20 NOTE — Telephone Encounter (Signed)
Orders are in. Patient is aware. ?

## 2021-04-28 ENCOUNTER — Encounter: Payer: Self-pay | Admitting: Internal Medicine

## 2021-04-29 MED ORDER — TRAZODONE HCL 100 MG PO TABS: 100.0000 mg | ORAL_TABLET | Freq: Every day | ORAL | 1 refills | Status: DC

## 2021-04-29 NOTE — Telephone Encounter (Signed)
[place] for him to take trazodone 100 mg qhs? ?

## 2021-04-29 NOTE — Telephone Encounter (Signed)
Rx sent in for trazodone '100mg'$  #30 with one refill.   ?

## 2021-05-10 ENCOUNTER — Telehealth: Payer: Self-pay | Admitting: Internal Medicine

## 2021-05-10 ENCOUNTER — Other Ambulatory Visit: Payer: Self-pay

## 2021-05-10 NOTE — Telephone Encounter (Signed)
Pt called and said he missed his Hep A and B shot and he was at the pharmacy then and he is stating the pharmacy said it may not work because of the time frame and that he would have to start the shots over again . The pharmacy wanted him to check with his doctor to make sure it was fine. Also pt want to get a PVC but the pharmacy told him it stops at the age of 54. The pharmacy was going to give it to him but it flagged it because of his age so he want to see if he can get a prescription for it ? ?

## 2021-05-10 NOTE — Telephone Encounter (Signed)
See me about this before calling pt.  For adults 27 years and older, catch-up vaccination is not routinely recommended. The likelihood of prior exposure to HPV vaccine types increases with age, and thus the population benefit and cost-effectiveness of HPV vaccination is lower among older patients  ?

## 2021-05-10 NOTE — Telephone Encounter (Signed)
S/w pt - advised it has been over a year since he had his injection. Must restart series of HepA/B shots. ? ?Msg sent to Dr Nicki Reaper to inquire re: HPV vaccine. ?

## 2021-05-11 NOTE — Telephone Encounter (Signed)
Pt advised - gave verbal understanding ?

## 2021-05-17 DIAGNOSIS — K219 Gastro-esophageal reflux disease without esophagitis: Secondary | ICD-10-CM | POA: Diagnosis not present

## 2021-05-17 DIAGNOSIS — R748 Abnormal levels of other serum enzymes: Secondary | ICD-10-CM | POA: Diagnosis not present

## 2021-05-17 DIAGNOSIS — R768 Other specified abnormal immunological findings in serum: Secondary | ICD-10-CM | POA: Diagnosis not present

## 2021-05-25 ENCOUNTER — Other Ambulatory Visit: Payer: Self-pay | Admitting: Internal Medicine

## 2021-06-07 DIAGNOSIS — L308 Other specified dermatitis: Secondary | ICD-10-CM | POA: Diagnosis not present

## 2021-06-07 DIAGNOSIS — D485 Neoplasm of uncertain behavior of skin: Secondary | ICD-10-CM | POA: Diagnosis not present

## 2021-06-08 ENCOUNTER — Other Ambulatory Visit: Payer: Self-pay | Admitting: Internal Medicine

## 2021-06-16 DIAGNOSIS — E7800 Pure hypercholesterolemia, unspecified: Secondary | ICD-10-CM | POA: Diagnosis not present

## 2021-06-16 DIAGNOSIS — R002 Palpitations: Secondary | ICD-10-CM | POA: Diagnosis not present

## 2021-06-16 DIAGNOSIS — R55 Syncope and collapse: Secondary | ICD-10-CM | POA: Diagnosis not present

## 2021-06-16 DIAGNOSIS — Z9889 Other specified postprocedural states: Secondary | ICD-10-CM | POA: Diagnosis not present

## 2021-06-16 DIAGNOSIS — R000 Tachycardia, unspecified: Secondary | ICD-10-CM | POA: Diagnosis not present

## 2021-06-16 DIAGNOSIS — I10 Essential (primary) hypertension: Secondary | ICD-10-CM | POA: Diagnosis not present

## 2021-06-22 ENCOUNTER — Encounter: Payer: Self-pay | Admitting: Internal Medicine

## 2021-06-22 DIAGNOSIS — R69 Illness, unspecified: Secondary | ICD-10-CM | POA: Diagnosis not present

## 2021-06-23 DIAGNOSIS — M19011 Primary osteoarthritis, right shoulder: Secondary | ICD-10-CM | POA: Diagnosis not present

## 2021-06-23 DIAGNOSIS — M5136 Other intervertebral disc degeneration, lumbar region: Secondary | ICD-10-CM | POA: Diagnosis not present

## 2021-06-23 DIAGNOSIS — G8929 Other chronic pain: Secondary | ICD-10-CM | POA: Diagnosis not present

## 2021-06-23 DIAGNOSIS — M069 Rheumatoid arthritis, unspecified: Secondary | ICD-10-CM | POA: Diagnosis not present

## 2021-06-23 DIAGNOSIS — M059 Rheumatoid arthritis with rheumatoid factor, unspecified: Secondary | ICD-10-CM | POA: Diagnosis not present

## 2021-06-23 DIAGNOSIS — M19012 Primary osteoarthritis, left shoulder: Secondary | ICD-10-CM | POA: Diagnosis not present

## 2021-06-23 DIAGNOSIS — M19022 Primary osteoarthritis, left elbow: Secondary | ICD-10-CM | POA: Diagnosis not present

## 2021-06-23 DIAGNOSIS — Z9049 Acquired absence of other specified parts of digestive tract: Secondary | ICD-10-CM | POA: Diagnosis not present

## 2021-06-23 DIAGNOSIS — Z8719 Personal history of other diseases of the digestive system: Secondary | ICD-10-CM | POA: Diagnosis not present

## 2021-06-23 DIAGNOSIS — M7918 Myalgia, other site: Secondary | ICD-10-CM | POA: Diagnosis not present

## 2021-06-23 DIAGNOSIS — M5030 Other cervical disc degeneration, unspecified cervical region: Secondary | ICD-10-CM | POA: Diagnosis not present

## 2021-06-23 DIAGNOSIS — M545 Low back pain, unspecified: Secondary | ICD-10-CM | POA: Diagnosis not present

## 2021-06-23 DIAGNOSIS — M79602 Pain in left arm: Secondary | ICD-10-CM | POA: Diagnosis not present

## 2021-06-23 DIAGNOSIS — G894 Chronic pain syndrome: Secondary | ICD-10-CM | POA: Diagnosis not present

## 2021-06-23 DIAGNOSIS — M170 Bilateral primary osteoarthritis of knee: Secondary | ICD-10-CM | POA: Diagnosis not present

## 2021-06-24 ENCOUNTER — Other Ambulatory Visit: Payer: Self-pay | Admitting: Internal Medicine

## 2021-06-24 NOTE — Telephone Encounter (Signed)
He sees cardiology (Dr Saralyn Pilar).  I would recommend that he contact Dr Saralyn Pilar and ask him for clearance to restart adderall - given history of hypertension and palpitations.

## 2021-06-30 ENCOUNTER — Encounter: Payer: Self-pay | Admitting: Internal Medicine

## 2021-06-30 ENCOUNTER — Ambulatory Visit (INDEPENDENT_AMBULATORY_CARE_PROVIDER_SITE_OTHER): Payer: Medicare HMO | Admitting: Internal Medicine

## 2021-06-30 DIAGNOSIS — M545 Low back pain, unspecified: Secondary | ICD-10-CM

## 2021-06-30 DIAGNOSIS — G8929 Other chronic pain: Secondary | ICD-10-CM | POA: Diagnosis not present

## 2021-06-30 DIAGNOSIS — M069 Rheumatoid arthritis, unspecified: Secondary | ICD-10-CM | POA: Diagnosis not present

## 2021-06-30 DIAGNOSIS — R7989 Other specified abnormal findings of blood chemistry: Secondary | ICD-10-CM

## 2021-06-30 DIAGNOSIS — I10 Essential (primary) hypertension: Secondary | ICD-10-CM

## 2021-06-30 DIAGNOSIS — Z0000 Encounter for general adult medical examination without abnormal findings: Secondary | ICD-10-CM

## 2021-06-30 DIAGNOSIS — R5383 Other fatigue: Secondary | ICD-10-CM | POA: Diagnosis not present

## 2021-06-30 DIAGNOSIS — F320 Major depressive disorder, single episode, mild: Secondary | ICD-10-CM

## 2021-06-30 DIAGNOSIS — I77819 Aortic ectasia, unspecified site: Secondary | ICD-10-CM | POA: Diagnosis not present

## 2021-06-30 DIAGNOSIS — E7800 Pure hypercholesterolemia, unspecified: Secondary | ICD-10-CM | POA: Diagnosis not present

## 2021-06-30 DIAGNOSIS — D649 Anemia, unspecified: Secondary | ICD-10-CM | POA: Diagnosis not present

## 2021-06-30 DIAGNOSIS — R748 Abnormal levels of other serum enzymes: Secondary | ICD-10-CM

## 2021-06-30 DIAGNOSIS — R69 Illness, unspecified: Secondary | ICD-10-CM | POA: Diagnosis not present

## 2021-06-30 NOTE — Progress Notes (Signed)
Patient ID: [name], male   DOB: 27-Apr-1960, 61 y.o.   MRN: [id number]   Subjective:    Patient ID: [name], male    DOB: [birth year], 61 y.o.   MRN: [id number]   Patient here for his physical exam.   Chief Complaint  Patient presents with   Follow-up    Yearly CPE   .   HPI Had f/u with cardiology 06/16/21 - felt stable.  No further cardiac w/up warranted.  Denies chest pain.  Breathing stable.  Main complaint is fatigue.  Relates to RA.  Eating.  No nausea or vomiting.  Saw GI. Felt the hep b core IgM positive test was a false positive. Recommend reassess hepatitis status if his liver function tests increased.  Has persistent mild elevated alk phos.  Fibroscan score has been low and reassuring and serologies have been ok.  Recommended repeating his transient elastography score every 3 years so long as alk phos remains abnormal.  No abdominal pain.  Bowels moving.  Receiving humira - for RA.     Past Medical History:  Diagnosis Date   Allergy    Anoxic brain injury [facility])    Arthritis    Awareness under anesthesia    need a lot to put me under   Chronic back pain    s/p 5 back surgeries   Coronary artery disease    Cardiac catheterization in 2007 at [facility] showed 30% stenosis in proximal LAD. No other disease. Ejection fraction was 65%   Depression    H/O acute pancreatitis    Hypercholesterolemia    Hypertension    Mild cognitive impairment with memory loss    Pancreatitis    Past Surgical History:  Procedure Laterality Date   abdomial surgery     BACK SURGERY     multiple back surgeries (5)   CARDIAC CATHETERIZATION     [facility]    CHOLECYSTECTOMY  2013   COLONOSCOPY N/A 03/05/2021   Procedure: COLONOSCOPY;  Surgeon: Annamaria Helling, DO;  Location: [facility] ENDOSCOPY;  Service: Gastroenterology;  Laterality: N/A;  IDDM   COLONOSCOPY WITH PROPOFOL N/A 06/30/2015   Procedure: COLONOSCOPY WITH PROPOFOL;  Surgeon: Lollie Sails, MD;  Location: [facility] ENDOSCOPY;   Service: Endoscopy;  Laterality: N/A;   ESOPHAGOGASTRODUODENOSCOPY (EGD) WITH PROPOFOL N/A 03/30/2016   Procedure: ESOPHAGOGASTRODUODENOSCOPY (EGD) WITH PROPOFOL;  Surgeon: Manya Silvas, MD;  Location: [facility] ENDOSCOPY;  Service: Endoscopy;  Laterality: N/A;   FUNCTIONAL ENDOSCOPIC SINUS SURGERY     JOINT REPLACEMENT     LAMINECTOMY     LUMBAR SPINE SURGERY     SHOULDER ARTHROSCOPY WITH ROTATOR CUFF REPAIR AND OPEN BICEPS TENODESIS Right    SHOULDER ARTHROSCOPY WITH ROTATOR CUFF REPAIR AND OPEN BICEPS TENODESIS     SPINE SURGERY     TRIGGER POINT INJECTION     VASECTOMY     With Reversal   VEIN SURGERY Right 2013   arm   Family History  Problem Relation Age of Onset   Heart disease Father    Heart attack Father    Heart disease Mother    Heart attack Mother    Heart attack Brother        MI's x 2    Hypertension Other    Colon cancer Other    Hypertension Sister    Heart attack Paternal Grandfather    Hypertension Sister    Social History   Socioeconomic History   Marital status: Divorced  Spouse name: Not on file   Number of children: 3   Years of education: Not on file   Highest education level: Not on file  Occupational History   Not on file  Tobacco Use   Smoking status: Never   Smokeless tobacco: Never  Vaping Use   Vaping Use: Never used  Substance and Sexual Activity   Alcohol use: No    Alcohol/week: 0.0 standard drinks   Drug use: No   Sexual activity: Yes  Other Topics Concern   Not on file  Social History Narrative   Not on file   Social Determinants of Health   Financial Resource Strain: Low Risk    Difficulty of Paying Living Expenses: Not hard at all  Food Insecurity: No Food Insecurity   Worried About Charity fundraiser in the Last Year: Never true   [place] in the Last Year: Never true  Transportation Needs: No Transportation Needs   Lack of Transportation (Medical): No   Lack of Transportation (Non-Medical): No  Physical  Activity: Not on file  Stress: No Stress Concern Present   Feeling of Stress : Not at all  Social Connections: Unknown   Frequency of Communication with Friends and Family: More than three times a week   Frequency of Social Gatherings with Friends and Family: More than three times a week   Attends Religious Services: Not on file   Active Member of Clubs or Organizations: Not on file   Attends Archivist Meetings: Not on file   Marital Status: Not on file     Review of Systems  Constitutional:  Positive for fatigue. Negative for appetite change and unexpected weight change.  HENT:  Negative for congestion, sinus pressure and sore throat.   Eyes:  Negative for pain and visual disturbance.  Respiratory:  Negative for cough, chest tightness and shortness of breath.   Cardiovascular:  Negative for chest pain, palpitations and leg swelling.  Gastrointestinal:  Negative for abdominal pain, diarrhea, nausea and vomiting.  Genitourinary:  Negative for difficulty urinating and dysuria.  Musculoskeletal:  Positive for back pain. Negative for joint swelling and myalgias.  Skin:  Negative for color change and rash.  Neurological:  Negative for dizziness, light-headedness and headaches.  Hematological:  Negative for adenopathy. Does not bruise/bleed easily.  Psychiatric/Behavioral:  Negative for agitation and dysphoric mood.       Objective:     BP (!) 144/78 (BP Location: Left Arm, Patient Position: Sitting, Cuff Size: Large)   Pulse 83   Temp 98.4 F (36.9 C) (Temporal)   Resp 16   Ht 6' (1.829 m)   Wt 211 lb 9.6 oz (96 kg)   SpO2 98%   BMI 28.70 kg/m  Wt Readings from Last 3 Encounters:  06/30/21 211 lb 9.6 oz (96 kg)  04/14/21 212 lb (96.2 kg)  03/05/21 221 lb (100.2 kg)    Physical Exam Constitutional:      General: He is not in acute distress.    Appearance: Normal appearance. He is well-developed.  HENT:     Head: Normocephalic and atraumatic.     Right Ear:  External ear normal.     Left Ear: External ear normal.  Eyes:     General: No scleral icterus.       Right eye: No discharge.        Left eye: No discharge.     Conjunctiva/sclera: Conjunctivae normal.  Neck:     Thyroid:  No thyromegaly.  Cardiovascular:     Rate and Rhythm: Normal rate and regular rhythm.  Pulmonary:     Effort: No respiratory distress.     Breath sounds: Normal breath sounds. No wheezing.  Abdominal:     General: Bowel sounds are normal.     Palpations: Abdomen is soft.     Tenderness: There is no abdominal tenderness.  Musculoskeletal:        General: No swelling or tenderness.     Cervical back: Neck supple. No tenderness.  Lymphadenopathy:     Cervical: No cervical adenopathy.  Skin:    Findings: No erythema or rash.  Neurological:     Mental Status: He is alert and oriented to person, place, and time.  Psychiatric:        Mood and Affect: Mood normal.        Behavior: Behavior normal.     Outpatient Encounter Medications as of 06/30/2021  Medication Sig   Adalimumab 40 MG/0.4ML PNKT Inject 40 mg into the skin every 14 (fourteen) days.   amLODipine (NORVASC) 5 MG tablet TAKE 1 TABLET BY MOUTH TWICE A DAY   atomoxetine (STRATTERA) 100 MG capsule Take 100 mg by mouth daily.   fluticasone (FLONASE) 50 MCG/ACT nasal spray Place 1 spray into both nostrils 2 (two) times daily.   gabapentin (NEURONTIN) 300 MG capsule Take 1 or 2 capsules by mouth TID   hpv 9-valent vaccine (GARDASIL 9) SUSP injection Inject 0.5 mLs into the muscle once. To be injected by pharmacist at pharmacy   modafinil (PROVIGIL) 200 MG tablet Take 400 mg by mouth in the morning.   oxyCODONE (OXY IR/ROXICODONE) 5 MG immediate release tablet Take 5-10 mg by mouth at bedtime as needed for moderate pain.    rosuvastatin (CRESTOR) 5 MG tablet TAKE 1 TABLET BY MOUTH EVERY DAY   sildenafil (REVATIO) 20 MG tablet TAKE 4 - 5 TABLETS BY MOUTH DAILY AS NEEDED   sucralfate (CARAFATE) 1 g tablet  Take 1 g by mouth daily.    tamsulosin (FLOMAX) 0.4 MG CAPS capsule Take 0.4 mg by mouth daily.   tiZANidine (ZANAFLEX) 4 MG tablet TAKE 1 - 2 TABLETS BY MOUTH THREE TIMES DAILY AS NEEDED   traZODone (DESYREL) 100 MG tablet TAKE 1 TABLET (100 MG TOTAL) BY MOUTH AT BEDTIME. DO NOT TAKE WITH OXYCODONE OR MUSCLE RELAXER.   triamcinolone cream (KENALOG) 0.1 % Apply 1 application topically 2 (two) times daily as needed.   No facility-administered encounter medications on file as of 06/30/2021.     Lab Results  Component Value Date   WBC 6.7 05/07/2020   HGB 13.7 05/07/2020   HCT 39.7 05/07/2020   PLT 354.0 05/07/2020   GLUCOSE 102 (H) 04/14/2021   CHOL 113 09/07/2020   TRIG 95.0 09/07/2020   HDL 49.30 09/07/2020   LDLCALC 44 09/07/2020   ALT 25 04/14/2021   AST 23 04/14/2021   NA 136 04/14/2021   K 4.4 04/20/2021   CL 102 04/14/2021   CREATININE 1.05 04/14/2021   BUN 18 04/14/2021   CO2 27 04/14/2021   TSH 1.63 04/14/2021   PSA 0.63 09/07/2020   INR 1.0 09/24/2019   HGBA1C 5.6 09/23/2019       Assessment & Plan:   Problem List Items Addressed This Visit     Abnormal liver function tests    Found to have elevated alkaline phos.  Evaluated by GI.  Currently being followed - hepatology clinic. Recent fibroscan - no  fibrosis.  Recommended repeating his transient elastography score every 3 years so long as alk phos remains abnormal.        Anemia    Follow cbc.        Aortic dilatation (HCC)    Followed by cardiology.        Chronic back pain    Followed by pain clinic/[facility].  Stable.        Depression, major, single episode, mild ([place])    Followed by Dr Toy Care.  Appears to be doing relatively well.  Follow.        Elevated alkaline phosphatase level   Relevant Orders   Hepatic function panel   Fatigue    Relates to his RA.  Tries to stay active.  Follow.         Health care maintenance    Physical today 06/30/21.  Colonoscopy due 03/05/26.  PSA 09/07/20 - .63.         Hypercholesteremia    Low cholesterol diet and exercise.  Continue crestor.  Check lipid panel and liver function tests.         Relevant Orders   Hepatic function panel   Lipid panel   Hypertension    Continue amlodipine.  Follow pressures. Follow metabolic panel.         Relevant Orders   Basic metabolic panel   Rheumatoid arthritis [facility])    Being followed by rheumatology.  On humira.  Stable.        Relevant Orders   CBC with Differential/Platelet     Einar Pheasant, MD

## 2021-06-30 NOTE — Assessment & Plan Note (Addendum)
Physical today 06/30/21.  Colonoscopy due 03/05/26.  PSA 09/07/20 - .63.

## 2021-07-04 ENCOUNTER — Encounter: Payer: Self-pay | Admitting: Internal Medicine

## 2021-07-04 NOTE — Assessment & Plan Note (Signed)
Followed by cardiology 

## 2021-07-04 NOTE — Assessment & Plan Note (Signed)
Follow cbc.  

## 2021-07-04 NOTE — Assessment & Plan Note (Signed)
Continue amlodipine.  Follow pressures.  Follow metabolic panel.   

## 2021-07-04 NOTE — Assessment & Plan Note (Signed)
Being followed by rheumatology.  On humira.  Stable.  

## 2021-07-04 NOTE — Assessment & Plan Note (Signed)
Followed by pain clinic/[facility].  Stable.  

## 2021-07-04 NOTE — Assessment & Plan Note (Signed)
Relates to his RA.  Tries to stay active.  Follow.

## 2021-07-04 NOTE — Assessment & Plan Note (Signed)
Followed by Dr Kaur.  Appears to be doing relatively well.  Follow.  

## 2021-07-04 NOTE — Assessment & Plan Note (Signed)
Low cholesterol diet and exercise.  Continue crestor.  Check lipid panel and liver function tests.   

## 2021-07-04 NOTE — Assessment & Plan Note (Signed)
Found to have elevated alkaline phos.  Evaluated by GI.  Currently being followed - hepatology clinic. Recent fibroscan - no fibrosis.  Recommended repeating his transient elastography score every 3 years so long as alk phos remains abnormal.

## 2021-07-07 DIAGNOSIS — M059 Rheumatoid arthritis with rheumatoid factor, unspecified: Secondary | ICD-10-CM | POA: Diagnosis not present

## 2021-07-14 DIAGNOSIS — M25522 Pain in left elbow: Secondary | ICD-10-CM | POA: Diagnosis not present

## 2021-07-14 DIAGNOSIS — M79602 Pain in left arm: Secondary | ICD-10-CM | POA: Diagnosis not present

## 2021-07-14 DIAGNOSIS — M069 Rheumatoid arthritis, unspecified: Secondary | ICD-10-CM | POA: Diagnosis not present

## 2021-07-14 DIAGNOSIS — Z8719 Personal history of other diseases of the digestive system: Secondary | ICD-10-CM | POA: Diagnosis not present

## 2021-07-14 DIAGNOSIS — M545 Low back pain, unspecified: Secondary | ICD-10-CM | POA: Diagnosis not present

## 2021-07-14 DIAGNOSIS — M25561 Pain in right knee: Secondary | ICD-10-CM | POA: Diagnosis not present

## 2021-07-14 DIAGNOSIS — M7918 Myalgia, other site: Secondary | ICD-10-CM | POA: Diagnosis not present

## 2021-07-14 DIAGNOSIS — Z9049 Acquired absence of other specified parts of digestive tract: Secondary | ICD-10-CM | POA: Diagnosis not present

## 2021-07-14 DIAGNOSIS — Z981 Arthrodesis status: Secondary | ICD-10-CM | POA: Diagnosis not present

## 2021-07-14 DIAGNOSIS — M25512 Pain in left shoulder: Secondary | ICD-10-CM | POA: Diagnosis not present

## 2021-07-14 DIAGNOSIS — M5136 Other intervertebral disc degeneration, lumbar region: Secondary | ICD-10-CM | POA: Diagnosis not present

## 2021-07-14 DIAGNOSIS — M25562 Pain in left knee: Secondary | ICD-10-CM | POA: Diagnosis not present

## 2021-07-14 DIAGNOSIS — G894 Chronic pain syndrome: Secondary | ICD-10-CM | POA: Diagnosis not present

## 2021-07-14 DIAGNOSIS — M5030 Other cervical disc degeneration, unspecified cervical region: Secondary | ICD-10-CM | POA: Diagnosis not present

## 2021-07-14 DIAGNOSIS — G8929 Other chronic pain: Secondary | ICD-10-CM | POA: Diagnosis not present

## 2021-07-14 DIAGNOSIS — M25511 Pain in right shoulder: Secondary | ICD-10-CM | POA: Diagnosis not present

## 2021-07-21 ENCOUNTER — Other Ambulatory Visit: Payer: Medicare HMO

## 2021-08-06 DIAGNOSIS — S60451A Superficial foreign body of left index finger, initial encounter: Secondary | ICD-10-CM | POA: Diagnosis not present

## 2021-08-09 DIAGNOSIS — M5030 Other cervical disc degeneration, unspecified cervical region: Secondary | ICD-10-CM | POA: Diagnosis not present

## 2021-08-09 DIAGNOSIS — Z79899 Other long term (current) drug therapy: Secondary | ICD-10-CM | POA: Diagnosis not present

## 2021-08-09 DIAGNOSIS — G8929 Other chronic pain: Secondary | ICD-10-CM | POA: Diagnosis not present

## 2021-08-09 DIAGNOSIS — M7918 Myalgia, other site: Secondary | ICD-10-CM | POA: Diagnosis not present

## 2021-08-09 DIAGNOSIS — Z7982 Long term (current) use of aspirin: Secondary | ICD-10-CM | POA: Diagnosis not present

## 2021-08-09 DIAGNOSIS — G894 Chronic pain syndrome: Secondary | ICD-10-CM | POA: Diagnosis not present

## 2021-08-09 DIAGNOSIS — Z8719 Personal history of other diseases of the digestive system: Secondary | ICD-10-CM | POA: Diagnosis not present

## 2021-08-09 DIAGNOSIS — M5136 Other intervertebral disc degeneration, lumbar region: Secondary | ICD-10-CM | POA: Diagnosis not present

## 2021-08-09 DIAGNOSIS — Z9049 Acquired absence of other specified parts of digestive tract: Secondary | ICD-10-CM | POA: Diagnosis not present

## 2021-09-07 DIAGNOSIS — R21 Rash and other nonspecific skin eruption: Secondary | ICD-10-CM | POA: Diagnosis not present

## 2021-09-07 DIAGNOSIS — L308 Other specified dermatitis: Secondary | ICD-10-CM | POA: Diagnosis not present

## 2021-09-20 DIAGNOSIS — M7918 Myalgia, other site: Secondary | ICD-10-CM | POA: Diagnosis not present

## 2021-09-20 DIAGNOSIS — M25512 Pain in left shoulder: Secondary | ICD-10-CM | POA: Diagnosis not present

## 2021-09-20 DIAGNOSIS — M25561 Pain in right knee: Secondary | ICD-10-CM | POA: Diagnosis not present

## 2021-09-20 DIAGNOSIS — Z9049 Acquired absence of other specified parts of digestive tract: Secondary | ICD-10-CM | POA: Diagnosis not present

## 2021-09-20 DIAGNOSIS — M5030 Other cervical disc degeneration, unspecified cervical region: Secondary | ICD-10-CM | POA: Diagnosis not present

## 2021-09-20 DIAGNOSIS — Z8719 Personal history of other diseases of the digestive system: Secondary | ICD-10-CM | POA: Diagnosis not present

## 2021-09-20 DIAGNOSIS — G894 Chronic pain syndrome: Secondary | ICD-10-CM | POA: Diagnosis not present

## 2021-09-20 DIAGNOSIS — M25562 Pain in left knee: Secondary | ICD-10-CM | POA: Diagnosis not present

## 2021-09-20 DIAGNOSIS — M069 Rheumatoid arthritis, unspecified: Secondary | ICD-10-CM | POA: Diagnosis not present

## 2021-09-20 DIAGNOSIS — M25522 Pain in left elbow: Secondary | ICD-10-CM | POA: Diagnosis not present

## 2021-09-20 DIAGNOSIS — M5136 Other intervertebral disc degeneration, lumbar region: Secondary | ICD-10-CM | POA: Diagnosis not present

## 2021-09-20 DIAGNOSIS — M25511 Pain in right shoulder: Secondary | ICD-10-CM | POA: Diagnosis not present

## 2021-09-20 DIAGNOSIS — R21 Rash and other nonspecific skin eruption: Secondary | ICD-10-CM | POA: Diagnosis not present

## 2021-09-20 DIAGNOSIS — L309 Dermatitis, unspecified: Secondary | ICD-10-CM | POA: Diagnosis not present

## 2021-09-20 DIAGNOSIS — G8929 Other chronic pain: Secondary | ICD-10-CM | POA: Diagnosis not present

## 2021-09-20 DIAGNOSIS — R202 Paresthesia of skin: Secondary | ICD-10-CM | POA: Diagnosis not present

## 2021-10-13 ENCOUNTER — Telehealth (INDEPENDENT_AMBULATORY_CARE_PROVIDER_SITE_OTHER): Payer: Medicare HMO | Admitting: Family Medicine

## 2021-10-13 ENCOUNTER — Encounter: Payer: Self-pay | Admitting: Family Medicine

## 2021-10-13 ENCOUNTER — Encounter: Payer: Self-pay | Admitting: Internal Medicine

## 2021-10-13 VITALS — Temp 100.0°F

## 2021-10-13 DIAGNOSIS — U071 COVID-19: Secondary | ICD-10-CM

## 2021-10-13 DIAGNOSIS — Z8616 Personal history of COVID-19: Secondary | ICD-10-CM | POA: Insufficient documentation

## 2021-10-13 MED ORDER — NIRMATRELVIR/RITONAVIR (PAXLOVID)TABLET: 3.0000 | ORAL_TABLET | Freq: Two times a day (BID) | ORAL | 0 refills | Status: AC

## 2021-10-13 NOTE — Progress Notes (Signed)
[facility] Visit  Patient consented to have virtual visit and was identified by name and date of birth. Method of visit: Video  Encounter participants: Patient: [name] - located at home Provider: Carollee Leitz - located at office Others (if applicable): girlfriend, Mickel Baas  Chief Complaint: tested positive for COVID  HPI:  Symptoms started last night. Sore throat, sneezing, dry cough, fatigue and feels weak.  Elevated  temp 100.  Taking Tylenol 500 mg alternating with Ibuprofen 600 mg q 4h.  Reports increase in thirst.  Reports oxygen saturations on apple watch 93% last night but now 97%.  Two home COVID tests positive.  Denies any shortness of breath, difficulty breathing or chest pain.  ROS: per HPI  Pertinent PMHx:  RA   Exam:  Temp 100 F (37.8 C) (Oral)   SpO2 97% Comment: was 93 last night  Respiratory: Speaking in full sentences.  No increased work of breathing or cough.  Assessment/Plan:  Positive self-administered antigen test for COVID-19 Two home COVID tests positive.  Saturations 97%.  Not in respiratory distress during video visit. -Start Paxlovid BID x 5 days -COVID precautions per CDC provided -Tylenol 650 mg q6h -Stop Ibuprofen or use as needed for discomfort -Ensure adequate hydration. -Strict ED precautions provided -Follow up with PCP   PDMP Reviewed  Time spent during visit with patient: 20 minutes

## 2021-10-13 NOTE — Telephone Encounter (Signed)
Spoke with pt and scheduled him for a virtual visit with Dr. Volanda Napoleon this afternoon.

## 2021-10-13 NOTE — Assessment & Plan Note (Signed)
Two home COVID tests positive.  Saturations 97%.  Not in respiratory distress during video visit. -Start Paxlovid BID x 5 days -COVID precautions per CDC provided -Tylenol 650 mg q6h -Stop Ibuprofen or use as needed for discomfort -Ensure adequate hydration. -Strict ED precautions provided -Follow up with PCP

## 2021-10-18 NOTE — Telephone Encounter (Signed)
He does not need a refill on paxlovid.  If persistent symptoms, can schedule him for a virtual visit.  If any acute symptoms, sob, chest pain, etc - needs to be seen today at acute care or urgent care.

## 2021-10-18 NOTE — Telephone Encounter (Signed)
Spoke with pt and informed him of the message below. Pt stated that he will hold off on scheduling an appt, he would like to see how he feels tomorrow.

## 2021-10-20 DIAGNOSIS — M7918 Myalgia, other site: Secondary | ICD-10-CM | POA: Diagnosis not present

## 2021-10-20 DIAGNOSIS — G894 Chronic pain syndrome: Secondary | ICD-10-CM | POA: Diagnosis not present

## 2021-10-20 DIAGNOSIS — R051 Acute cough: Secondary | ICD-10-CM | POA: Diagnosis not present

## 2021-10-20 DIAGNOSIS — R42 Dizziness and giddiness: Secondary | ICD-10-CM | POA: Diagnosis not present

## 2021-10-20 DIAGNOSIS — M5030 Other cervical disc degeneration, unspecified cervical region: Secondary | ICD-10-CM | POA: Diagnosis not present

## 2021-10-20 DIAGNOSIS — M5136 Other intervertebral disc degeneration, lumbar region: Secondary | ICD-10-CM | POA: Diagnosis not present

## 2021-11-02 ENCOUNTER — Encounter: Payer: Self-pay | Admitting: Internal Medicine

## 2021-11-02 ENCOUNTER — Ambulatory Visit (INDEPENDENT_AMBULATORY_CARE_PROVIDER_SITE_OTHER): Payer: Medicare HMO | Admitting: Internal Medicine

## 2021-11-02 VITALS — BP 124/84 | HR 78 | Temp 98.4°F | Ht 72.0 in | Wt 219.0 lb

## 2021-11-02 DIAGNOSIS — Z8719 Personal history of other diseases of the digestive system: Secondary | ICD-10-CM | POA: Diagnosis not present

## 2021-11-02 DIAGNOSIS — F320 Major depressive disorder, single episode, mild: Secondary | ICD-10-CM

## 2021-11-02 DIAGNOSIS — I77819 Aortic ectasia, unspecified site: Secondary | ICD-10-CM | POA: Diagnosis not present

## 2021-11-02 DIAGNOSIS — Z125 Encounter for screening for malignant neoplasm of prostate: Secondary | ICD-10-CM

## 2021-11-02 DIAGNOSIS — G8929 Other chronic pain: Secondary | ICD-10-CM | POA: Diagnosis not present

## 2021-11-02 DIAGNOSIS — E7800 Pure hypercholesterolemia, unspecified: Secondary | ICD-10-CM | POA: Diagnosis not present

## 2021-11-02 DIAGNOSIS — R69 Illness, unspecified: Secondary | ICD-10-CM | POA: Diagnosis not present

## 2021-11-02 DIAGNOSIS — M545 Low back pain, unspecified: Secondary | ICD-10-CM

## 2021-11-02 DIAGNOSIS — R748 Abnormal levels of other serum enzymes: Secondary | ICD-10-CM | POA: Diagnosis not present

## 2021-11-02 DIAGNOSIS — Z8616 Personal history of COVID-19: Secondary | ICD-10-CM

## 2021-11-02 DIAGNOSIS — I10 Essential (primary) hypertension: Secondary | ICD-10-CM

## 2021-11-02 DIAGNOSIS — M069 Rheumatoid arthritis, unspecified: Secondary | ICD-10-CM | POA: Diagnosis not present

## 2021-11-02 LAB — LIPID PANEL
Cholesterol: 120 mg/dL (ref 0–200)
HDL: 56.3 mg/dL (ref >39.00)
LDL Cholesterol: 48 mg/dL (ref 0–99)
NonHDL: 63.72
Total CHOL/HDL Ratio: 2
Triglycerides: 79 mg/dL (ref 0.0–149.0)
VLDL: 15.8 mg/dL (ref 0.0–40.0)

## 2021-11-02 LAB — CBC WITH DIFFERENTIAL/PLATELET
Basophils Absolute: 0.1 10*3/uL (ref 0.0–0.1)
Basophils Relative: 0.8 % (ref 0.0–3.0)
Eosinophils Absolute: 0.1 10*3/uL (ref 0.0–0.7)
Eosinophils Relative: 1.7 % (ref 0.0–5.0)
HCT: 43.6 % (ref 39.0–52.0)
Hemoglobin: 14.8 g/dL (ref 13.0–17.0)
Lymphocytes Relative: 23.4 % (ref 12.0–46.0)
Lymphs Abs: 1.8 10*3/uL (ref 0.7–4.0)
MCHC: 34 g/dL (ref 30.0–36.0)
MCV: 92 fl (ref 78.0–100.0)
Monocytes Absolute: 0.8 10*3/uL (ref 0.1–1.0)
Monocytes Relative: 10.4 % (ref 3.0–12.0)
Neutro Abs: 4.9 10*3/uL (ref 1.4–7.7)
Neutrophils Relative %: 63.7 % (ref 43.0–77.0)
Platelets: 331 10*3/uL (ref 150.0–400.0)
RBC: 4.74 Mil/uL (ref 4.22–5.81)
RDW: 14 % (ref 11.5–15.5)
WBC: 7.7 10*3/uL (ref 4.0–10.5)

## 2021-11-02 LAB — BASIC METABOLIC PANEL
BUN: 15 mg/dL (ref 6–23)
CO2: 27 mEq/L (ref 19–32)
Calcium: 9.7 mg/dL (ref 8.4–10.5)
Chloride: 101 mEq/L (ref 96–112)
Creatinine, Ser: 1.11 mg/dL (ref 0.40–1.50)
GFR: 72.84 mL/min (ref >60.00)
Glucose, Bld: 88 mg/dL (ref 70–99)
Potassium: 4.4 mEq/L (ref 3.5–5.1)
Sodium: 137 mEq/L (ref 135–145)

## 2021-11-02 LAB — PSA: PSA: 0.53 ng/mL (ref 0.10–4.00)

## 2021-11-02 LAB — HEPATIC FUNCTION PANEL
ALT: 22 U/L (ref 0–53)
AST: 19 U/L (ref 0–37)
Albumin: 4.6 g/dL (ref 3.5–5.2)
Alkaline Phosphatase: 113 U/L (ref 39–117)
Bilirubin, Direct: 0.1 mg/dL (ref 0.0–0.3)
Total Bilirubin: 0.5 mg/dL (ref 0.2–1.2)
Total Protein: 7.9 g/dL (ref 6.0–8.3)

## 2021-11-02 NOTE — Progress Notes (Signed)
Patient ID: [name], male   DOB: 1960-02-19, 61 y.o.   MRN: [id number]   Subjective:    Patient ID: [name], male    DOB: 05/06/1960, 61 y.o.   MRN: [id number]   Patient here for  Chief Complaint  Patient presents with   Follow-up    4 month follow up   .   HPI Recently diagnosed with covid.  Treated with paxlovid.  After completion, was evaluated 9/20 - for return of symptoms.  CXR clear.  EKG ok.  Prescribed tessalon perles.  Also seeing rheumatology - RA.  Humira started 07/2020.  Last seen 07/2021 - increased frequency of humira. Noticed yesterday some increased drainage.  No sore throat.  Minimal cough/chest congestion.  No sob.  No chest pain.  No significant symptoms today.  Energy improving.  Taste has improved, but still off.  Had flu vaccine last week.  Blood pressure ok.    Past Medical History:  Diagnosis Date   Allergy    Anoxic brain injury [facility])    Arthritis    Awareness under anesthesia    need a lot to put me under   Chronic back pain    s/p 5 back surgeries   Coronary artery disease    Cardiac catheterization in 2007 at [facility] showed 30% stenosis in proximal LAD. No other disease. Ejection fraction was 65%   Depression    H/O acute pancreatitis    Hypercholesterolemia    Hypertension    Mild cognitive impairment with memory loss    Pancreatitis    Past Surgical History:  Procedure Laterality Date   abdomial surgery     BACK SURGERY     multiple back surgeries (5)   CARDIAC CATHETERIZATION     [facility]    CHOLECYSTECTOMY  2013   COLONOSCOPY N/A 03/05/2021   Procedure: COLONOSCOPY;  Surgeon: Annamaria Helling, DO;  Location: [facility] ENDOSCOPY;  Service: Gastroenterology;  Laterality: N/A;  IDDM   COLONOSCOPY WITH PROPOFOL N/A 06/30/2015   Procedure: COLONOSCOPY WITH PROPOFOL;  Surgeon: Lollie Sails, MD;  Location: [facility] ENDOSCOPY;  Service: Endoscopy;  Laterality: N/A;   ESOPHAGOGASTRODUODENOSCOPY (EGD) WITH PROPOFOL N/A 03/30/2016   Procedure:  ESOPHAGOGASTRODUODENOSCOPY (EGD) WITH PROPOFOL;  Surgeon: Manya Silvas, MD;  Location: [facility] ENDOSCOPY;  Service: Endoscopy;  Laterality: N/A;   FUNCTIONAL ENDOSCOPIC SINUS SURGERY     JOINT REPLACEMENT     LAMINECTOMY     LUMBAR SPINE SURGERY     SHOULDER ARTHROSCOPY WITH ROTATOR CUFF REPAIR AND OPEN BICEPS TENODESIS Right    SHOULDER ARTHROSCOPY WITH ROTATOR CUFF REPAIR AND OPEN BICEPS TENODESIS     SPINE SURGERY     TRIGGER POINT INJECTION     VASECTOMY     With Reversal   VEIN SURGERY Right 2013   arm   Family History  Problem Relation Age of Onset   Heart disease Father    Heart attack Father    Heart disease Mother    Heart attack Mother    Heart attack Brother        MI's x 2    Hypertension Other    Colon cancer Other    Hypertension Sister    Heart attack Paternal Grandfather    Hypertension Sister    Social History   Socioeconomic History   Marital status: Divorced    Spouse name: Not on file   Number of children: 3   Years of education: Not on file   Highest  education level: Not on file  Occupational History   Not on file  Tobacco Use   Smoking status: Never   Smokeless tobacco: Never  Vaping Use   Vaping Use: Never used  Substance and Sexual Activity   Alcohol use: No    Alcohol/week: 0.0 standard drinks of alcohol   Drug use: No   Sexual activity: Yes  Other Topics Concern   Not on file  Social History Narrative   Not on file   Social Determinants of Health   Financial Resource Strain: Low Risk  (12/29/2020)   Overall Financial Resource Strain (CARDIA)    Difficulty of Paying Living Expenses: Not hard at all  Food Insecurity: No Food Insecurity (12/29/2020)   Hunger Vital Sign    Worried About Running Out of Food in the Last Year: Never true    Ran Out of Food in the Last Year: Never true  Transportation Needs: No Transportation Needs (12/29/2020)   PRAPARE - Hydrologist (Medical): No    Lack of  Transportation (Non-Medical): No  Physical Activity: Not on file  Stress: No Stress Concern Present (12/29/2020)   [place]    Feeling of Stress : Not at all  Social Connections: Unknown (12/29/2020)   Social Connection and Isolation Panel [NHANES]    Frequency of Communication with Friends and Family: More than three times a week    Frequency of Social Gatherings with Friends and Family: More than three times a week    Attends Religious Services: Not on Advertising copywriter or Organizations: Not on file    Attends Archivist Meetings: Not on file    Marital Status: Not on file     Review of Systems  Constitutional:  Negative for appetite change and unexpected weight change.  HENT:  Negative for sinus pressure.        Minimal congestion and drainage as outlined.   Respiratory:  Negative for chest tightness and shortness of breath.        Minimal cough.   Cardiovascular:  Negative for chest pain and palpitations.  Gastrointestinal:  Negative for abdominal pain, nausea and vomiting.  Genitourinary:  Negative for difficulty urinating and dysuria.  Musculoskeletal:  Positive for back pain. Negative for joint swelling and myalgias.       Chronic back pain.   Skin:  Negative for color change and rash.  Neurological:  Negative for dizziness, light-headedness and headaches.  Psychiatric/Behavioral:  Negative for agitation and dysphoric mood.        Objective:     BP 124/84 (BP Location: Left Arm, Patient Position: Sitting, Cuff Size: Normal)   Pulse 78   Temp 98.4 F (36.9 C) (Oral)   Ht 6' (1.829 m)   Wt 219 lb (99.3 kg)   SpO2 98%   BMI 29.70 kg/m  Wt Readings from Last 3 Encounters:  11/02/21 219 lb (99.3 kg)  06/30/21 211 lb 9.6 oz (96 kg)  04/14/21 212 lb (96.2 kg)    Physical Exam Constitutional:      General: He is not in acute distress.    Appearance: Normal appearance. He is  well-developed.  HENT:     Head: Normocephalic and atraumatic.     Right Ear: External ear normal.     Left Ear: External ear normal.  Eyes:     General: No scleral icterus.  Right eye: No discharge.        Left eye: No discharge.  Cardiovascular:     Rate and Rhythm: Normal rate and regular rhythm.  Pulmonary:     Effort: Pulmonary effort is normal. No respiratory distress.     Breath sounds: Normal breath sounds.  Abdominal:     General: Bowel sounds are normal.     Palpations: Abdomen is soft.     Tenderness: There is no abdominal tenderness.  Musculoskeletal:        General: No swelling or tenderness.     Cervical back: Neck supple. No tenderness.  Lymphadenopathy:     Cervical: No cervical adenopathy.  Skin:    Findings: No erythema or rash.  Neurological:     Mental Status: He is alert.  Psychiatric:        Mood and Affect: Mood normal.        Behavior: Behavior normal.      Outpatient Encounter Medications as of 11/02/2021  Medication Sig   amLODipine (NORVASC) 5 MG tablet TAKE 1 TABLET BY MOUTH TWICE A DAY   atomoxetine (STRATTERA) 100 MG capsule Take 100 mg by mouth daily.   fluticasone (FLONASE) 50 MCG/ACT nasal spray Place 1 spray into both nostrils 2 (two) times daily.   gabapentin (NEURONTIN) 300 MG capsule Take 1 or 2 capsules by mouth TID   hpv 9-valent vaccine (GARDASIL 9) SUSP injection Inject 0.5 mLs into the muscle once. To be injected by pharmacist at pharmacy   modafinil (PROVIGIL) 200 MG tablet Take 400 mg by mouth in the morning.   oxyCODONE (OXY IR/ROXICODONE) 5 MG immediate release tablet Take 5-10 mg by mouth at bedtime as needed for moderate pain.    rosuvastatin (CRESTOR) 5 MG tablet TAKE 1 TABLET BY MOUTH EVERY DAY   sildenafil (REVATIO) 20 MG tablet TAKE 4 - 5 TABLETS BY MOUTH DAILY AS NEEDED   sucralfate (CARAFATE) 1 g tablet Take 1 g by mouth daily.    tamsulosin (FLOMAX) 0.4 MG CAPS capsule Take 0.4 mg by mouth daily.   tiZANidine  (ZANAFLEX) 4 MG tablet TAKE 1 - 2 TABLETS BY MOUTH THREE TIMES DAILY AS NEEDED   traZODone (DESYREL) 100 MG tablet TAKE 1 TABLET (100 MG TOTAL) BY MOUTH AT BEDTIME. DO NOT TAKE WITH OXYCODONE OR MUSCLE RELAXER.   triamcinolone cream (KENALOG) 0.1 % Apply 1 application topically 2 (two) times daily as needed.   Adalimumab 40 MG/0.4ML PNKT Inject 40 mg into the skin every 14 (fourteen) days.   No facility-administered encounter medications on file as of 11/02/2021.     Lab Results  Component Value Date   WBC 7.7 11/02/2021   HGB 14.8 11/02/2021   HCT 43.6 11/02/2021   PLT 331.0 11/02/2021   GLUCOSE 88 11/02/2021   CHOL 120 11/02/2021   TRIG 79.0 11/02/2021   HDL 56.30 11/02/2021   LDLCALC 48 11/02/2021   ALT 22 11/02/2021   AST 19 11/02/2021   NA 137 11/02/2021   K 4.4 11/02/2021   CL 101 11/02/2021   CREATININE 1.11 11/02/2021   BUN 15 11/02/2021   CO2 27 11/02/2021   TSH 1.63 04/14/2021   PSA 0.53 11/02/2021   INR 1.0 09/24/2019   HGBA1C 5.6 09/23/2019       Assessment & Plan:   Problem List Items Addressed This Visit     Aortic dilatation [facility])    Has been followed by cardiology.       Chronic back pain  Followed by pain clinic/[facility].  Stable.       Depression, major, single episode, mild ([place])    Followed by Dr Toy Care.  Appears to be doing relatively well.  Follow.       Elevated alkaline phosphatase level    Recheck liver panel today. Has seen GI.       History of COVID-19    Recent infection.  Tool paxlovid.  Feeling better.  Energy improving.  No chest pain or sob.  Some drainage/minimal cough - yesterday.  Feeling better.  Can use robitussin DM and nasal spray as directed.  Follow.        History of pancreatitis    No pain.  Eating.  No nausea or vomiting.  Follow.       Hypercholesteremia    Low cholesterol diet and exercise.  Continue crestor.  Check lipid panel and liver function tests.        Hypertension    Continue amlodipine.  Follow  pressures. Follow metabolic panel.        Rheumatoid arthritis [facility])    Being followed by rheumatology.  On humira.  Stable.       Other Visit Diagnoses     Prostate cancer screening    -  Primary   Relevant Orders   PSA (Completed)        Einar Pheasant, MD

## 2021-11-07 ENCOUNTER — Encounter: Payer: Self-pay | Admitting: Internal Medicine

## 2021-11-07 NOTE — Assessment & Plan Note (Signed)
Followed by Dr Kaur.  Appears to be doing relatively well.  Follow.  

## 2021-11-07 NOTE — Assessment & Plan Note (Signed)
Being followed by rheumatology.  On humira.  Stable.  

## 2021-11-07 NOTE — Assessment & Plan Note (Signed)
Followed by pain clinic/[facility].  Stable.

## 2021-11-07 NOTE — Assessment & Plan Note (Signed)
No pain.  Eating.  No nausea or vomiting.  Follow.

## 2021-11-07 NOTE — Assessment & Plan Note (Signed)
Has been followed by cardiology.  

## 2021-11-07 NOTE — Assessment & Plan Note (Signed)
Recheck liver panel today. Has seen GI.

## 2021-11-07 NOTE — Assessment & Plan Note (Signed)
Continue amlodipine.  Follow pressures.  Follow metabolic panel.   

## 2021-11-07 NOTE — Assessment & Plan Note (Signed)
Low cholesterol diet and exercise.  Continue crestor.  Check lipid panel and liver function tests.   

## 2021-11-07 NOTE — Assessment & Plan Note (Signed)
Recent infection.  Tool paxlovid.  Feeling better.  Energy improving.  No chest pain or sob.  Some drainage/minimal cough - yesterday.  Feeling better.  Can use robitussin DM and nasal spray as directed.  Follow.

## 2021-11-11 ENCOUNTER — Other Ambulatory Visit: Payer: Self-pay | Admitting: Internal Medicine

## 2021-11-12 ENCOUNTER — Other Ambulatory Visit: Payer: Self-pay | Admitting: Internal Medicine

## 2021-11-17 DIAGNOSIS — M5030 Other cervical disc degeneration, unspecified cervical region: Secondary | ICD-10-CM | POA: Diagnosis not present

## 2021-11-17 DIAGNOSIS — G894 Chronic pain syndrome: Secondary | ICD-10-CM | POA: Diagnosis not present

## 2021-11-17 DIAGNOSIS — M7918 Myalgia, other site: Secondary | ICD-10-CM | POA: Diagnosis not present

## 2021-11-17 DIAGNOSIS — M5136 Other intervertebral disc degeneration, lumbar region: Secondary | ICD-10-CM | POA: Diagnosis not present

## 2021-11-17 DIAGNOSIS — G8929 Other chronic pain: Secondary | ICD-10-CM | POA: Diagnosis not present

## 2021-11-18 DIAGNOSIS — R748 Abnormal levels of other serum enzymes: Secondary | ICD-10-CM | POA: Diagnosis not present

## 2021-11-23 DIAGNOSIS — R748 Abnormal levels of other serum enzymes: Secondary | ICD-10-CM | POA: Diagnosis not present

## 2021-12-15 DIAGNOSIS — Z981 Arthrodesis status: Secondary | ICD-10-CM | POA: Diagnosis not present

## 2021-12-15 DIAGNOSIS — Z8719 Personal history of other diseases of the digestive system: Secondary | ICD-10-CM | POA: Diagnosis not present

## 2021-12-15 DIAGNOSIS — G8929 Other chronic pain: Secondary | ICD-10-CM | POA: Diagnosis not present

## 2021-12-15 DIAGNOSIS — G894 Chronic pain syndrome: Secondary | ICD-10-CM | POA: Diagnosis not present

## 2021-12-15 DIAGNOSIS — M5136 Other intervertebral disc degeneration, lumbar region: Secondary | ICD-10-CM | POA: Diagnosis not present

## 2021-12-15 DIAGNOSIS — M5030 Other cervical disc degeneration, unspecified cervical region: Secondary | ICD-10-CM | POA: Diagnosis not present

## 2021-12-15 DIAGNOSIS — M069 Rheumatoid arthritis, unspecified: Secondary | ICD-10-CM | POA: Diagnosis not present

## 2021-12-15 DIAGNOSIS — M7918 Myalgia, other site: Secondary | ICD-10-CM | POA: Diagnosis not present

## 2021-12-16 DIAGNOSIS — L308 Other specified dermatitis: Secondary | ICD-10-CM | POA: Diagnosis not present

## 2021-12-16 DIAGNOSIS — R21 Rash and other nonspecific skin eruption: Secondary | ICD-10-CM | POA: Diagnosis not present

## 2021-12-28 ENCOUNTER — Telehealth: Payer: Self-pay | Admitting: Internal Medicine

## 2021-12-28 NOTE — Telephone Encounter (Signed)
Copied from [place] [id number]. Topic: Medicare AWV >> Dec 28, 2021  1:51 PM Devoria Glassing wrote: Reason for CRM: Left message for patient to schedule Annual Wellness Visit.  Please schedule with Nurse Health Advisor Denisa O'Brien-Blaney, LPN at [facility]. This appt can be telephone or office visit.  Please call [phone] ask for [facility]

## 2021-12-29 DIAGNOSIS — R55 Syncope and collapse: Secondary | ICD-10-CM | POA: Diagnosis not present

## 2021-12-29 DIAGNOSIS — Z9889 Other specified postprocedural states: Secondary | ICD-10-CM | POA: Diagnosis not present

## 2021-12-29 DIAGNOSIS — E7800 Pure hypercholesterolemia, unspecified: Secondary | ICD-10-CM | POA: Diagnosis not present

## 2021-12-29 DIAGNOSIS — R000 Tachycardia, unspecified: Secondary | ICD-10-CM | POA: Diagnosis not present

## 2021-12-29 DIAGNOSIS — I10 Essential (primary) hypertension: Secondary | ICD-10-CM | POA: Diagnosis not present

## 2021-12-29 DIAGNOSIS — R002 Palpitations: Secondary | ICD-10-CM | POA: Diagnosis not present

## 2022-01-04 ENCOUNTER — Telehealth: Payer: Self-pay | Admitting: Internal Medicine

## 2022-01-04 NOTE — Telephone Encounter (Signed)
Copied from [place] [id number] [phone]. Topic: Medicare AWV >> Jan 04, 2022 10:01 AM Devoria Glassing wrote: Reason for CRM: Left message for patient to schedule Annual Wellness Visit.  Please schedule with Nurse Health Advisor Denisa O'Brien-Blaney, LPN at [facility]. This appt can be telephone or office visit.  Please call [phone] ask for [facility]

## 2022-01-06 ENCOUNTER — Other Ambulatory Visit: Payer: Self-pay | Admitting: Internal Medicine

## 2022-01-07 DIAGNOSIS — L270 Generalized skin eruption due to drugs and medicaments taken internally: Secondary | ICD-10-CM | POA: Diagnosis not present

## 2022-01-07 DIAGNOSIS — M059 Rheumatoid arthritis with rheumatoid factor, unspecified: Secondary | ICD-10-CM | POA: Diagnosis not present

## 2022-01-12 ENCOUNTER — Telehealth: Payer: Self-pay | Admitting: Internal Medicine

## 2022-01-12 DIAGNOSIS — M19022 Primary osteoarthritis, left elbow: Secondary | ICD-10-CM | POA: Diagnosis not present

## 2022-01-12 DIAGNOSIS — M545 Low back pain, unspecified: Secondary | ICD-10-CM | POA: Diagnosis not present

## 2022-01-12 DIAGNOSIS — M5030 Other cervical disc degeneration, unspecified cervical region: Secondary | ICD-10-CM | POA: Diagnosis not present

## 2022-01-12 DIAGNOSIS — G894 Chronic pain syndrome: Secondary | ICD-10-CM | POA: Diagnosis not present

## 2022-01-12 DIAGNOSIS — M170 Bilateral primary osteoarthritis of knee: Secondary | ICD-10-CM | POA: Diagnosis not present

## 2022-01-12 DIAGNOSIS — M19011 Primary osteoarthritis, right shoulder: Secondary | ICD-10-CM | POA: Diagnosis not present

## 2022-01-12 DIAGNOSIS — M19012 Primary osteoarthritis, left shoulder: Secondary | ICD-10-CM | POA: Diagnosis not present

## 2022-01-12 DIAGNOSIS — M069 Rheumatoid arthritis, unspecified: Secondary | ICD-10-CM | POA: Diagnosis not present

## 2022-01-12 DIAGNOSIS — Z8719 Personal history of other diseases of the digestive system: Secondary | ICD-10-CM | POA: Diagnosis not present

## 2022-01-12 DIAGNOSIS — M7918 Myalgia, other site: Secondary | ICD-10-CM | POA: Diagnosis not present

## 2022-01-12 DIAGNOSIS — G8929 Other chronic pain: Secondary | ICD-10-CM | POA: Diagnosis not present

## 2022-01-12 DIAGNOSIS — M5136 Other intervertebral disc degeneration, lumbar region: Secondary | ICD-10-CM | POA: Diagnosis not present

## 2022-01-12 NOTE — Telephone Encounter (Signed)
Copied from [place] [id number]. Topic: Medicare AWV >> Jan 12, 2022 11:37 AM Devoria Glassing wrote: Reason for CRM: Left message for patient to schedule Annual Wellness Visit.  Please schedule with Nurse Health Advisor Madelyn Brunner, LPN at [facility]. This appt can be telephone or office visit.  Please call [phone] ask for [facility]

## 2022-02-10 DIAGNOSIS — L308 Other specified dermatitis: Secondary | ICD-10-CM | POA: Diagnosis not present

## 2022-02-16 DIAGNOSIS — M170 Bilateral primary osteoarthritis of knee: Secondary | ICD-10-CM | POA: Diagnosis not present

## 2022-02-16 DIAGNOSIS — G894 Chronic pain syndrome: Secondary | ICD-10-CM | POA: Diagnosis not present

## 2022-02-16 DIAGNOSIS — Z8719 Personal history of other diseases of the digestive system: Secondary | ICD-10-CM | POA: Diagnosis not present

## 2022-02-16 DIAGNOSIS — M7918 Myalgia, other site: Secondary | ICD-10-CM | POA: Diagnosis not present

## 2022-02-16 DIAGNOSIS — M5136 Other intervertebral disc degeneration, lumbar region: Secondary | ICD-10-CM | POA: Diagnosis not present

## 2022-02-16 DIAGNOSIS — M545 Low back pain, unspecified: Secondary | ICD-10-CM | POA: Diagnosis not present

## 2022-02-16 DIAGNOSIS — M25522 Pain in left elbow: Secondary | ICD-10-CM | POA: Diagnosis not present

## 2022-02-16 DIAGNOSIS — M069 Rheumatoid arthritis, unspecified: Secondary | ICD-10-CM | POA: Diagnosis not present

## 2022-02-16 DIAGNOSIS — M5030 Other cervical disc degeneration, unspecified cervical region: Secondary | ICD-10-CM | POA: Diagnosis not present

## 2022-03-03 DIAGNOSIS — R002 Palpitations: Secondary | ICD-10-CM | POA: Diagnosis not present

## 2022-03-07 ENCOUNTER — Encounter: Payer: Self-pay | Admitting: Internal Medicine

## 2022-03-07 ENCOUNTER — Ambulatory Visit (INDEPENDENT_AMBULATORY_CARE_PROVIDER_SITE_OTHER): Payer: Medicare HMO | Admitting: Internal Medicine

## 2022-03-07 VITALS — BP 134/72 | HR 90 | Temp 98.0°F | Resp 16 | Ht 73.0 in | Wt 204.0 lb

## 2022-03-07 DIAGNOSIS — R413 Other amnesia: Secondary | ICD-10-CM

## 2022-03-07 DIAGNOSIS — R69 Illness, unspecified: Secondary | ICD-10-CM | POA: Diagnosis not present

## 2022-03-07 DIAGNOSIS — Z8719 Personal history of other diseases of the digestive system: Secondary | ICD-10-CM

## 2022-03-07 DIAGNOSIS — M069 Rheumatoid arthritis, unspecified: Secondary | ICD-10-CM

## 2022-03-07 DIAGNOSIS — K5730 Diverticulosis of large intestine without perforation or abscess without bleeding: Secondary | ICD-10-CM | POA: Diagnosis not present

## 2022-03-07 DIAGNOSIS — M545 Low back pain, unspecified: Secondary | ICD-10-CM | POA: Diagnosis not present

## 2022-03-07 DIAGNOSIS — R7989 Other specified abnormal findings of blood chemistry: Secondary | ICD-10-CM | POA: Diagnosis not present

## 2022-03-07 DIAGNOSIS — I10 Essential (primary) hypertension: Secondary | ICD-10-CM | POA: Diagnosis not present

## 2022-03-07 DIAGNOSIS — D649 Anemia, unspecified: Secondary | ICD-10-CM | POA: Diagnosis not present

## 2022-03-07 DIAGNOSIS — E7800 Pure hypercholesterolemia, unspecified: Secondary | ICD-10-CM

## 2022-03-07 DIAGNOSIS — R55 Syncope and collapse: Secondary | ICD-10-CM

## 2022-03-07 DIAGNOSIS — F320 Major depressive disorder, single episode, mild: Secondary | ICD-10-CM

## 2022-03-07 DIAGNOSIS — R5383 Other fatigue: Secondary | ICD-10-CM

## 2022-03-07 DIAGNOSIS — I77819 Aortic ectasia, unspecified site: Secondary | ICD-10-CM

## 2022-03-07 DIAGNOSIS — G8929 Other chronic pain: Secondary | ICD-10-CM

## 2022-03-07 LAB — AMYLASE: Amylase: 87 U/L (ref 27–131)

## 2022-03-07 LAB — BASIC METABOLIC PANEL
BUN: 14 mg/dL (ref 6–23)
CO2: 25 mEq/L (ref 19–32)
Calcium: 9.1 mg/dL (ref 8.4–10.5)
Chloride: 102 mEq/L (ref 96–112)
Creatinine, Ser: 0.97 mg/dL (ref 0.40–1.50)
GFR: 85.43 mL/min (ref >60.00)
Glucose, Bld: 90 mg/dL (ref 70–99)
Potassium: 4.3 mEq/L (ref 3.5–5.1)
Sodium: 137 mEq/L (ref 135–145)

## 2022-03-07 LAB — LIPASE: Lipase: 307 U/L — ABNORMAL HIGH (ref 11.0–59.0)

## 2022-03-07 LAB — HEPATIC FUNCTION PANEL
ALT: 43 U/L (ref 0–53)
AST: 35 U/L (ref 0–37)
Albumin: 4.6 g/dL (ref 3.5–5.2)
Alkaline Phosphatase: 127 U/L — ABNORMAL HIGH (ref 39–117)
Bilirubin, Direct: 0.1 mg/dL (ref 0.0–0.3)
Total Bilirubin: 0.4 mg/dL (ref 0.2–1.2)
Total Protein: 7.7 g/dL (ref 6.0–8.3)

## 2022-03-07 LAB — TSH: TSH: 0.99 u[IU]/mL (ref 0.35–5.50)

## 2022-03-07 NOTE — Progress Notes (Unsigned)
Subjective:    Patient ID: [name], male    DOB: [birth year], 62 y.o.   MRN: [id number]  Patient here for  Chief Complaint  Patient presents with   Medical Management of Chronic Issues    HPI Seeing rheumatology - RA. Humira started 07/2020  Seeing Dr Claudia Desanctis - f/u low back pain and neck pain.  Last seen 02/16/22 - pain fairly controlled with medications, exercise and trigger point injections.  Received trigger point injections 1/17 and recommended to continue gabapentin and tizanidine. Saw cardiology 12/29/21 - recurrent syncopal episode.  Saw Dr Saralyn Pilar.  Holter.  Felt to be vasovagal syncope. 72-hour Holter monitor 12/29/2021 - 01/01/2022 revealed predominant sinus rhythm with mean heart rate of 87 bpm, sinus heart rate range 57 to 132 bpm. Rare premature atrial contractions were present. There was no correlation with diary entries.  Did report that approximately 10-14 days ago, went to bathroom.  Was urinating and had another syncopal episode.  Reports that prior to going into the bathroom, he was nauseous.  Denied head injury.  Crawled back to bed.  With the previous episode - felt queezy as well.  Reports increased fatigue.  States it felt like his "pancreatitis was coming back".  No nausea now.  No abdominal pain now.  Request to have pancreas enzymes checked.  He is trying to stay active.  No chest pain.  Breathing stable.  No increased cough or congestion.  Has f/u with cardiology tomorrow.   Past Medical History:  Diagnosis Date   Allergy    Anoxic brain injury [facility])    Arthritis    Awareness under anesthesia    need a lot to put me under   Chronic back pain    s/p 5 back surgeries   Coronary artery disease    Cardiac catheterization in 2007 at [facility] showed 30% stenosis in proximal LAD. No other disease. Ejection fraction was 65%   Depression    H/O acute pancreatitis    Hypercholesterolemia    Hypertension    Mild cognitive impairment with memory loss    Pancreatitis     Past Surgical History:  Procedure Laterality Date   abdomial surgery     BACK SURGERY     multiple back surgeries (5)   CARDIAC CATHETERIZATION     [facility]    CHOLECYSTECTOMY  2013   COLONOSCOPY N/A 03/05/2021   Procedure: COLONOSCOPY;  Surgeon: Annamaria Helling, DO;  Location: [facility] ENDOSCOPY;  Service: Gastroenterology;  Laterality: N/A;  IDDM   COLONOSCOPY WITH PROPOFOL N/A 06/30/2015   Procedure: COLONOSCOPY WITH PROPOFOL;  Surgeon: Lollie Sails, MD;  Location: [facility] ENDOSCOPY;  Service: Endoscopy;  Laterality: N/A;   ESOPHAGOGASTRODUODENOSCOPY (EGD) WITH PROPOFOL N/A 03/30/2016   Procedure: ESOPHAGOGASTRODUODENOSCOPY (EGD) WITH PROPOFOL;  Surgeon: Manya Silvas, MD;  Location: [facility] ENDOSCOPY;  Service: Endoscopy;  Laterality: N/A;   FUNCTIONAL ENDOSCOPIC SINUS SURGERY     JOINT REPLACEMENT     LAMINECTOMY     LUMBAR SPINE SURGERY     SHOULDER ARTHROSCOPY WITH ROTATOR CUFF REPAIR AND OPEN BICEPS TENODESIS Right    SHOULDER ARTHROSCOPY WITH ROTATOR CUFF REPAIR AND OPEN BICEPS TENODESIS     SPINE SURGERY     TRIGGER POINT INJECTION     VASECTOMY     With Reversal   VEIN SURGERY Right 2013   arm   Family History  Problem Relation Age of Onset   Heart disease Father    Heart attack Father  Heart disease Mother    Heart attack Mother    Heart attack Brother        MI's x 2    Hypertension Other    Colon cancer Other    Hypertension Sister    Heart attack Paternal Grandfather    Hypertension Sister    Social History   Socioeconomic History   Marital status: Divorced    Spouse name: Not on file   Number of children: 3   Years of education: Not on file   Highest education level: Not on file  Occupational History   Not on file  Tobacco Use   Smoking status: Never   Smokeless tobacco: Never  Vaping Use   Vaping Use: Never used  Substance and Sexual Activity   Alcohol use: No    Alcohol/week: 0.0 standard drinks of alcohol   Drug use: No   Sexual  activity: Yes  Other Topics Concern   Not on file  Social History Narrative   Not on file   Social Determinants of Health   Financial Resource Strain: Low Risk  (12/29/2020)   Overall Financial Resource Strain (CARDIA)    Difficulty of Paying Living Expenses: Not hard at all  Food Insecurity: No Food Insecurity (12/29/2020)   Hunger Vital Sign    Worried About Running Out of Food in the Last Year: Never true    [place] in the Last Year: Never true  Transportation Needs: No Transportation Needs (12/29/2020)   PRAPARE - Hydrologist (Medical): No    Lack of Transportation (Non-Medical): No  Physical Activity: Not on file  Stress: No Stress Concern Present (12/29/2020)   [place]    Feeling of Stress : Not at all  Social Connections: Unknown (12/29/2020)   Social Connection and Isolation Panel [NHANES]    Frequency of Communication with Friends and Family: More than three times a week    Frequency of Social Gatherings with Friends and Family: More than three times a week    Attends Religious Services: Not on Advertising copywriter or Organizations: Not on file    Attends Archivist Meetings: Not on file    Marital Status: Not on file     Review of Systems  Constitutional:  Positive for fatigue. Negative for appetite change.  HENT:  Negative for congestion and sinus pressure.   Respiratory:  Negative for cough, chest tightness and shortness of breath.   Cardiovascular:  Negative for chest pain, palpitations and leg swelling.  Gastrointestinal:  Negative for abdominal pain, diarrhea, nausea and vomiting.  Genitourinary:  Negative for difficulty urinating and dysuria.  Musculoskeletal:  Positive for back pain. Negative for myalgias.  Skin:  Negative for color change and rash.  Neurological:  Negative for dizziness and headaches.  Psychiatric/Behavioral:   Negative for agitation and dysphoric mood.        Objective:     BP 134/72   Pulse 90   Temp 98 F (36.7 C)   Resp 16   Ht '6\' 1"'$  (1.854 m)   Wt 204 lb (92.5 kg)   SpO2 98%   BMI 26.91 kg/m  Wt Readings from Last 3 Encounters:  03/07/22 204 lb (92.5 kg)  11/02/21 219 lb (99.3 kg)  06/30/21 211 lb 9.6 oz (96 kg)    Physical Exam Vitals reviewed.  Constitutional:      General: He  is not in acute distress.    Appearance: Normal appearance. He is well-developed.  HENT:     Head: Normocephalic and atraumatic.     Right Ear: External ear normal.     Left Ear: External ear normal.  Eyes:     General: No scleral icterus.       Right eye: No discharge.        Left eye: No discharge.     Conjunctiva/sclera: Conjunctivae normal.  Cardiovascular:     Rate and Rhythm: Normal rate and regular rhythm.  Pulmonary:     Effort: Pulmonary effort is normal. No respiratory distress.     Breath sounds: Normal breath sounds.  Abdominal:     General: Bowel sounds are normal.     Palpations: Abdomen is soft.     Tenderness: There is no abdominal tenderness.  Musculoskeletal:        General: No swelling or tenderness.     Cervical back: Neck supple. No tenderness.  Lymphadenopathy:     Cervical: No cervical adenopathy.  Skin:    Findings: No erythema or rash.  Neurological:     Mental Status: He is alert.  Psychiatric:        Mood and Affect: Mood normal.        Behavior: Behavior normal.      Outpatient Encounter Medications as of 03/07/2022  Medication Sig   Adalimumab 40 MG/0.4ML PNKT Inject 40 mg into the skin every 14 (fourteen) days.   amLODipine (NORVASC) 5 MG tablet TAKE 1 TABLET BY MOUTH TWICE A DAY   atomoxetine (STRATTERA) 100 MG capsule Take 100 mg by mouth daily.   fluticasone (FLONASE) 50 MCG/ACT nasal spray Place 1 spray into both nostrils 2 (two) times daily.   gabapentin (NEURONTIN) 300 MG capsule Take 1 or 2 capsules by mouth TID   hpv 9-valent vaccine  (GARDASIL 9) SUSP injection Inject 0.5 mLs into the muscle once. To be injected by pharmacist at pharmacy   modafinil (PROVIGIL) 200 MG tablet Take 400 mg by mouth in the morning.   oxyCODONE (OXY IR/ROXICODONE) 5 MG immediate release tablet Take 5-10 mg by mouth at bedtime as needed for moderate pain.    rosuvastatin (CRESTOR) 5 MG tablet TAKE 1 TABLET BY MOUTH EVERY DAY   sildenafil (REVATIO) 20 MG tablet TAKE 4 - 5 TABLETS BY MOUTH DAILY AS NEEDED   sucralfate (CARAFATE) 1 g tablet Take 1 g by mouth daily.    tamsulosin (FLOMAX) 0.4 MG CAPS capsule Take 0.4 mg by mouth daily.   tiZANidine (ZANAFLEX) 4 MG tablet TAKE 1 - 2 TABLETS BY MOUTH THREE TIMES DAILY AS NEEDED   traZODone (DESYREL) 100 MG tablet TAKE 1 TABLET (100 MG TOTAL) BY MOUTH AT BEDTIME. DO NOT TAKE WITH OXYCODONE OR MUSCLE RELAXER.   triamcinolone cream (KENALOG) 0.1 % Apply 1 application topically 2 (two) times daily as needed.   No facility-administered encounter medications on file as of 03/07/2022.     Lab Results  Component Value Date   WBC 7.7 11/02/2021   HGB 14.8 11/02/2021   HCT 43.6 11/02/2021   PLT 331.0 11/02/2021   GLUCOSE 90 03/07/2022   CHOL 120 11/02/2021   TRIG 79.0 11/02/2021   HDL 56.30 11/02/2021   LDLCALC 48 11/02/2021   ALT 43 03/07/2022   AST 35 03/07/2022   NA 137 03/07/2022   K 4.3 03/07/2022   CL 102 03/07/2022   CREATININE 0.97 03/07/2022   BUN 14 03/07/2022  CO2 25 03/07/2022   TSH 0.99 03/07/2022   PSA 0.53 11/02/2021   INR 1.0 09/24/2019   HGBA1C 5.6 09/23/2019    No results found.     Assessment & Plan:  Hypercholesteremia Assessment & Plan: Low cholesterol diet and exercise.  Continue crestor.  Check lipid panel and liver function tests.    Orders: -     Hepatic function panel  Primary hypertension Assessment & Plan: Continue amlodipine.  Follow pressures. Follow metabolic panel.  Not orthostatic on exam   Orders: -     Basic metabolic panel -     TSH  History  of pancreatitis Assessment & Plan: Symptoms previously as outlined.  Was concerned that "pancreatitis may be coming back".  No pain today.  No nausea.  Eating.  Does report increased fatigue.  Request to have pancreas enzymes checked.    Orders: -     Lipase -     Amylase  Abnormal liver function tests Assessment & Plan: Found to have elevated alkaline phos.  Evaluated by GI.  Currently being followed - hepatology clinic. Recent fibroscan - no fibrosis.  Recommended repeating his transient elastography score every 3 years so long as alk phos remains abnormal.     Anemia, unspecified type Assessment & Plan: Follow [facility].    Aortic dilatation [facility]) Assessment & Plan: Noted on previous CT chest.  Seeing cardiology.  Has f/u tomorrow.    Chronic low back pain, unspecified back pain laterality, unspecified whether sciatica present Assessment & Plan: Followed by pain clinic/[facility].  Stable. Seeing Dr Claudia Desanctis - f/u low back pain and neck pain.  Last seen 02/16/22 - pain fairly controlled with medications, exercise and trigger point injections.  Received trigger point injections 1/17 and recommended to continue gabapentin and tizanidine.   Depression, major, single episode, mild [facility]) Assessment & Plan: Followed by Dr Toy Care.  Appears to be doing relatively well.  Follow.    Diverticulosis of colon without hemorrhage Assessment & Plan: Colonoscopy 06/30/15 as outlined.  Recommended f/u colonoscopy in 10 years.     Other fatigue Assessment & Plan: Does report increased fatigue.  Previous nausea and not feeling as well.  Appears to be feeling better now.  No nausea or abdominal pain reported today.  Was concerned that the "pancreatitis was coming back".  Request to have pancreas enzymes checked today.  Will also check routine labs given increased fatigue.  Not orthostatic on exam.  Keep f/u with cardiology tomorrow.    Rheumatoid arthritis, involving unspecified site, unspecified whether  rheumatoid factor present [facility]) Assessment & Plan: Being followed by rheumatology.  On humira.  Stable.    Short-term memory loss Assessment & Plan: Is a persistent problems and feels is worsening.  Discussed f/u with neurology.  Request [facility] neurology.    Syncope, unspecified syncope type Assessment & Plan: Saw cardiology 12/29/21 - recurrent syncopal episode.  Saw Dr Saralyn Pilar.  Holter.  Felt to be vasovagal syncope. 72-hour Holter monitor 12/29/2021 - 01/01/2022 revealed predominant sinus rhythm with mean heart rate of 87 bpm, sinus heart rate range 57 to 132 bpm. Rare premature atrial contractions were present. There was no correlation with diary entries.  Did report that approximately 10-14 days ago, went to bathroom.  Was urinating and had another syncopal episode.  Reports that prior to going into the bathroom, he was nauseous.  Denied head injury.  Crawled back to bed.  With the previous episode - felt queezy as well.  Reports increased fatigue. Discussed  vasovagal syncope today.  Has f/u with cardiology tomorrow and plans to discuss if further w/up / evaluation warranted.        Einar Pheasant, MD

## 2022-03-08 ENCOUNTER — Other Ambulatory Visit: Payer: Self-pay

## 2022-03-08 DIAGNOSIS — Z8719 Personal history of other diseases of the digestive system: Secondary | ICD-10-CM

## 2022-03-08 DIAGNOSIS — R55 Syncope and collapse: Secondary | ICD-10-CM | POA: Diagnosis not present

## 2022-03-08 DIAGNOSIS — E7800 Pure hypercholesterolemia, unspecified: Secondary | ICD-10-CM | POA: Diagnosis not present

## 2022-03-08 DIAGNOSIS — I77819 Aortic ectasia, unspecified site: Secondary | ICD-10-CM | POA: Diagnosis not present

## 2022-03-08 DIAGNOSIS — R002 Palpitations: Secondary | ICD-10-CM | POA: Diagnosis not present

## 2022-03-08 DIAGNOSIS — I10 Essential (primary) hypertension: Secondary | ICD-10-CM | POA: Diagnosis not present

## 2022-03-08 DIAGNOSIS — Z9889 Other specified postprocedural states: Secondary | ICD-10-CM | POA: Diagnosis not present

## 2022-03-08 NOTE — Assessment & Plan Note (Signed)
Continue amlodipine.  Follow pressures. Follow metabolic panel.  Not orthostatic on exam

## 2022-03-08 NOTE — Assessment & Plan Note (Addendum)
Noted on previous CT chest.  Seeing cardiology.  Has f/u tomorrow.

## 2022-03-08 NOTE — Assessment & Plan Note (Signed)
Symptoms previously as outlined.  Was concerned that "pancreatitis may be coming back".  No pain today.  No nausea.  Eating.  Does report increased fatigue.  Request to have pancreas enzymes checked.

## 2022-03-08 NOTE — Assessment & Plan Note (Signed)
Saw cardiology 12/29/21 - recurrent syncopal episode.  Saw Dr Saralyn Pilar.  Holter.  Felt to be vasovagal syncope. 72-hour Holter monitor 12/29/2021 - 01/01/2022 revealed predominant sinus rhythm with mean heart rate of 87 bpm, sinus heart rate range 57 to 132 bpm. Rare premature atrial contractions were present. There was no correlation with diary entries.  Did report that approximately 10-14 days ago, went to bathroom.  Was urinating and had another syncopal episode.  Reports that prior to going into the bathroom, he was nauseous.  Denied head injury.  Crawled back to bed.  With the previous episode - felt queezy as well.  Reports increased fatigue. Discussed vasovagal syncope today.  Has f/u with cardiology tomorrow and plans to discuss if further w/up / evaluation warranted.

## 2022-03-08 NOTE — Assessment & Plan Note (Signed)
Is a persistent problems and feels is worsening.  Discussed f/u with neurology.  Request [facility] neurolog[facility].

## 2022-03-08 NOTE — Progress Notes (Signed)
Lab orders placed.  

## 2022-03-08 NOTE — Assessment & Plan Note (Signed)
Does report increased fatigue.  Previous nausea and not feeling as well.  Appears to be feeling better now.  No nausea or abdominal pain reported today.  Was concerned that the "pancreatitis was coming back".  Request to have pancreas enzymes checked today.  Will also check routine labs given increased fatigue.  Not orthostatic on exam.  Keep f/u with cardiology tomorrow.

## 2022-03-08 NOTE — Assessment & Plan Note (Signed)
Followed by pain clinic/[facility].  Stable. Seeing Dr Claudia Desanctis - f/u low back pain and neck pain.  Last seen 02/16/22 - pain fairly controlled with medications, exercise and trigger point injections.  Received trigger point injections 1/17 and recommended to continue gabapentin and tizanidine.

## 2022-03-08 NOTE — Assessment & Plan Note (Signed)
Followed by Dr Toy Care.  Appears to be doing relatively well.  Follow.

## 2022-03-08 NOTE — Assessment & Plan Note (Signed)
Colonoscopy 06/30/15 as outlined.  Recommended f/u colonoscopy in 10 years.

## 2022-03-08 NOTE — Assessment & Plan Note (Signed)
Found to have elevated alkaline phos.  Evaluated by GI.  Currently being followed - hepatology clinic. Recent fibroscan - no fibrosis.  Recommended repeating his transient elastography score every 3 years so long as alk phos remains abnormal.

## 2022-03-08 NOTE — Assessment & Plan Note (Signed)
Follow cbc.  

## 2022-03-08 NOTE — Assessment & Plan Note (Signed)
Low cholesterol diet and exercise.  Continue crestor.  Check lipid panel and liver function tests.

## 2022-03-08 NOTE — Assessment & Plan Note (Signed)
Being followed by rheumatology.  On humira.  Stable.

## 2022-03-08 NOTE — Telephone Encounter (Signed)
See result note.  

## 2022-03-09 ENCOUNTER — Other Ambulatory Visit (INDEPENDENT_AMBULATORY_CARE_PROVIDER_SITE_OTHER): Payer: Medicare HMO

## 2022-03-09 DIAGNOSIS — Z8719 Personal history of other diseases of the digestive system: Secondary | ICD-10-CM

## 2022-03-09 LAB — HEPATIC FUNCTION PANEL
ALT: 38 U/L (ref 0–53)
AST: 25 U/L (ref 0–37)
Albumin: 4.5 g/dL (ref 3.5–5.2)
Alkaline Phosphatase: 118 U/L — ABNORMAL HIGH (ref 39–117)
Bilirubin, Direct: 0.1 mg/dL (ref 0.0–0.3)
Total Bilirubin: 0.4 mg/dL (ref 0.2–1.2)
Total Protein: 7.4 g/dL (ref 6.0–8.3)

## 2022-03-09 LAB — LIPID PANEL
Cholesterol: 141 mg/dL (ref 0–200)
HDL: 56 mg/dL (ref >39.00)
LDL Cholesterol: 66 mg/dL (ref 0–99)
NonHDL: 84.5
Total CHOL/HDL Ratio: 3
Triglycerides: 92 mg/dL (ref 0.0–149.0)
VLDL: 18.4 mg/dL (ref 0.0–40.0)

## 2022-03-09 LAB — LIPASE: Lipase: 24 U/L (ref 11.0–59.0)

## 2022-03-10 ENCOUNTER — Telehealth: Payer: Self-pay

## 2022-03-10 NOTE — Telephone Encounter (Signed)
Pt called and I read the message to him and he understood

## 2022-03-10 NOTE — Telephone Encounter (Signed)
  Lm for pt to cb    Einar Pheasant, MD  [facility] Please notify - alkaline phosphatase - improved and near normal.  Remainder of liver panel wnl.  Lipase wnl.  Cholesterol levels look good.

## 2022-03-23 DIAGNOSIS — M19011 Primary osteoarthritis, right shoulder: Secondary | ICD-10-CM | POA: Diagnosis not present

## 2022-03-23 DIAGNOSIS — M25512 Pain in left shoulder: Secondary | ICD-10-CM | POA: Diagnosis not present

## 2022-03-23 DIAGNOSIS — M25511 Pain in right shoulder: Secondary | ICD-10-CM | POA: Diagnosis not present

## 2022-03-23 DIAGNOSIS — M25562 Pain in left knee: Secondary | ICD-10-CM | POA: Diagnosis not present

## 2022-03-23 DIAGNOSIS — Z8719 Personal history of other diseases of the digestive system: Secondary | ICD-10-CM | POA: Diagnosis not present

## 2022-03-23 DIAGNOSIS — R202 Paresthesia of skin: Secondary | ICD-10-CM | POA: Diagnosis not present

## 2022-03-23 DIAGNOSIS — M170 Bilateral primary osteoarthritis of knee: Secondary | ICD-10-CM | POA: Diagnosis not present

## 2022-03-23 DIAGNOSIS — M5030 Other cervical disc degeneration, unspecified cervical region: Secondary | ICD-10-CM | POA: Diagnosis not present

## 2022-03-23 DIAGNOSIS — M79602 Pain in left arm: Secondary | ICD-10-CM | POA: Diagnosis not present

## 2022-03-23 DIAGNOSIS — M7918 Myalgia, other site: Secondary | ICD-10-CM | POA: Diagnosis not present

## 2022-03-23 DIAGNOSIS — Z9889 Other specified postprocedural states: Secondary | ICD-10-CM | POA: Diagnosis not present

## 2022-03-23 DIAGNOSIS — M5136 Other intervertebral disc degeneration, lumbar region: Secondary | ICD-10-CM | POA: Diagnosis not present

## 2022-03-23 DIAGNOSIS — M25561 Pain in right knee: Secondary | ICD-10-CM | POA: Diagnosis not present

## 2022-03-23 DIAGNOSIS — G894 Chronic pain syndrome: Secondary | ICD-10-CM | POA: Diagnosis not present

## 2022-03-23 DIAGNOSIS — M069 Rheumatoid arthritis, unspecified: Secondary | ICD-10-CM | POA: Diagnosis not present

## 2022-03-23 DIAGNOSIS — M25522 Pain in left elbow: Secondary | ICD-10-CM | POA: Diagnosis not present

## 2022-03-25 DIAGNOSIS — I10 Essential (primary) hypertension: Secondary | ICD-10-CM | POA: Diagnosis not present

## 2022-03-25 DIAGNOSIS — M545 Low back pain, unspecified: Secondary | ICD-10-CM | POA: Diagnosis present

## 2022-03-25 DIAGNOSIS — L649 Androgenic alopecia, unspecified: Secondary | ICD-10-CM | POA: Diagnosis not present

## 2022-03-25 DIAGNOSIS — Z79899 Other long term (current) drug therapy: Secondary | ICD-10-CM | POA: Diagnosis not present

## 2022-03-25 DIAGNOSIS — G8929 Other chronic pain: Secondary | ICD-10-CM | POA: Insufficient documentation

## 2022-03-25 DIAGNOSIS — I2510 Atherosclerotic heart disease of native coronary artery without angina pectoris: Secondary | ICD-10-CM | POA: Insufficient documentation

## 2022-03-25 DIAGNOSIS — M5459 Other low back pain: Secondary | ICD-10-CM | POA: Diagnosis not present

## 2022-03-26 ENCOUNTER — Emergency Department
Admission: EM | Admit: 2022-03-26 | Discharge: 2022-03-26 | Disposition: A | Discharge status: Home / Self Care | Payer: Medicare HMO | Attending: Emergency Medicine | Admitting: Emergency Medicine

## 2022-03-26 ENCOUNTER — Other Ambulatory Visit: Payer: Self-pay

## 2022-03-26 DIAGNOSIS — G8929 Other chronic pain: Secondary | ICD-10-CM

## 2022-03-26 MED ORDER — HYDROMORPHONE HCL 1 MG/ML IJ SOLN
1.0000 mg | Freq: Once | INTRAMUSCULAR | Status: AC
  Administered 2022-03-26: 1 mg via INTRAVENOUS
  Filled 2022-03-26: qty 1

## 2022-03-26 MED ORDER — IBUPROFEN 800 MG PO TABS: 800.0000 mg | ORAL_TABLET | Freq: Three times a day (TID) | ORAL | 0 refills | Status: DC | PRN

## 2022-03-26 MED ORDER — ONDANSETRON HCL 4 MG/2ML IJ SOLN
4.0000 mg | Freq: Once | INTRAMUSCULAR | Status: AC
  Administered 2022-03-26: 4 mg via INTRAVENOUS
  Filled 2022-03-26: qty 2

## 2022-03-26 MED ORDER — KETOROLAC TROMETHAMINE 30 MG/ML IJ SOLN
30.0000 mg | Freq: Once | INTRAMUSCULAR | Status: AC
  Administered 2022-03-26: 30 mg via INTRAVENOUS
  Filled 2022-03-26: qty 1

## 2022-03-26 MED ORDER — OXYCODONE-ACETAMINOPHEN 5-325 MG PO TABS: 2.0000 | ORAL_TABLET | Freq: Four times a day (QID) | ORAL | 0 refills | Status: DC | PRN

## 2022-03-26 MED ORDER — LIDOCAINE 5 % EX PTCH
1.0000 | MEDICATED_PATCH | Freq: Once | CUTANEOUS | Status: DC
  Administered 2022-03-26: 1 via TRANSDERMAL
  Filled 2022-03-26: qty 1

## 2022-03-26 MED ORDER — ONDANSETRON 4 MG PO TBDP: 4.0000 mg | ORAL_TABLET | Freq: Four times a day (QID) | ORAL | 0 refills | Status: DC | PRN

## 2022-03-26 NOTE — ED Triage Notes (Signed)
Pt to ED via POV c/o lower back pain. Pt states pain started 3 days ago but has gotten worse. Has tried using heat, cold, pain meds but did not relieve pain. Pt with hx of 6 back surgeries with titanium screws.

## 2022-03-26 NOTE — ED Notes (Signed)
Pt brought to ed rm 1 at this time, this RN now assuming care.

## 2022-03-26 NOTE — ED Provider Notes (Signed)
[facility] Provider Note    Event Date/Time   First MD Initiated Contact with Patient 03/26/22 0110     (approximate)   History   Back Pain   HPI  [name] is a 62 y.o. male with history of chronic back pain status post multiple previous back surgeries, hypertension, hyperlipidemia, anoxic brain injury who presents to the emergency department with his wife for increasing lower back pain for the past several days.  No injury that he can recall.  No numbness, tingling, weakness, bowel or bladder incontinence, urinary retention, fevers.  Last trigger injections were in January 2024.  Recent epidural injections.  He has been followed by [facility] neurosurgery.  He states he has been taking his oxycodone at home without much pain relief.  He is almost out of this prescription and it was not refilled recently due to his drug screen testing positive for THC.  Also reports trying alternating heat and ice without relief.  He is on gabapentin chronically as well.   History provided by patient and wife.    Past Medical History:  Diagnosis Date   Allergy    Anoxic brain injury [facility])    Arthritis    Awareness under anesthesia    need a lot to put me under   Chronic back pain    s/p 5 back surgeries   Coronary artery disease    Cardiac catheterization in 2007 at [facility] showed 30% stenosis in proximal LAD. No other disease. Ejection fraction was 65%   Depression    H/O acute pancreatitis    Hypercholesterolemia    Hypertension    Mild cognitive impairment with memory loss    Pancreatitis     Past Surgical History:  Procedure Laterality Date   abdomial surgery     BACK SURGERY     multiple back surgeries (5)   CARDIAC CATHETERIZATION     [facility]    CHOLECYSTECTOMY  2013   COLONOSCOPY N/A 03/05/2021   Procedure: COLONOSCOPY;  Surgeon: Annamaria Helling, DO;  Location: [facility] ENDOSCOPY;  Service: Gastroenterology;  Laterality: N/A;  IDDM   COLONOSCOPY WITH  PROPOFOL N/A 06/30/2015   Procedure: COLONOSCOPY WITH PROPOFOL;  Surgeon: Lollie Sails, MD;  Location: [facility] ENDOSCOPY;  Service: Endoscopy;  Laterality: N/A;   ESOPHAGOGASTRODUODENOSCOPY (EGD) WITH PROPOFOL N/A 03/30/2016   Procedure: ESOPHAGOGASTRODUODENOSCOPY (EGD) WITH PROPOFOL;  Surgeon: Manya Silvas, MD;  Location: [facility] ENDOSCOPY;  Service: Endoscopy;  Laterality: N/A;   FUNCTIONAL ENDOSCOPIC SINUS SURGERY     JOINT REPLACEMENT     LAMINECTOMY     LUMBAR SPINE SURGERY     SHOULDER ARTHROSCOPY WITH ROTATOR CUFF REPAIR AND OPEN BICEPS TENODESIS Right    SHOULDER ARTHROSCOPY WITH ROTATOR CUFF REPAIR AND OPEN BICEPS TENODESIS     SPINE SURGERY     TRIGGER POINT INJECTION     VASECTOMY     With Reversal   VEIN SURGERY Right 2013   arm    MEDICATIONS:  Prior to Admission medications   Medication Sig Start Date End Date Taking? Authorizing Provider  Adalimumab 40 MG/0.4ML PNKT Inject 40 mg into the skin every 14 (fourteen) days. 07/17/20 07/17/21  [provider]  amLODipine (NORVASC) 5 MG tablet TAKE 1 TABLET BY MOUTH TWICE A DAY 01/06/22   Einar Pheasant, MD  atomoxetine (STRATTERA) 100 MG capsule Take 100 mg by mouth daily.    [provider]  fluticasone (FLONASE) 50 MCG/ACT nasal spray Place 1 spray into  both nostrils 2 (two) times daily. 12/08/20   Rodriguez-Southworth, Sunday Spillers, PA-C  gabapentin (NEURONTIN) 300 MG capsule Take 1 or 2 capsules by mouth TID    [provider]  hpv 9-valent vaccine (GARDASIL 9) SUSP injection Inject 0.5 mLs into the muscle once. To be injected by pharmacist at pharmacy    [provider]  modafinil (PROVIGIL) 200 MG tablet Take 400 mg by mouth in the morning.    [provider]  oxyCODONE (OXY IR/ROXICODONE) 5 MG immediate release tablet Take 5-10 mg by mouth at bedtime as needed for moderate pain.     [provider]  rosuvastatin (CRESTOR) 5 MG tablet TAKE 1 TABLET BY MOUTH EVERY DAY 11/12/21    Einar Pheasant, MD  sildenafil (REVATIO) 20 MG tablet TAKE 4 - 5 TABLETS BY MOUTH DAILY AS NEEDED    [provider]  sucralfate (CARAFATE) 1 g tablet Take 1 g by mouth daily.     [provider]  tamsulosin (FLOMAX) 0.4 MG CAPS capsule Take 0.4 mg by mouth daily. 03/28/19   [provider]  tiZANidine (ZANAFLEX) 4 MG tablet TAKE 1 - 2 TABLETS BY MOUTH THREE TIMES DAILY AS NEEDED    [provider]  traZODone (DESYREL) 100 MG tablet TAKE 1 TABLET (100 MG TOTAL) BY MOUTH AT BEDTIME. DO NOT TAKE WITH OXYCODONE OR MUSCLE RELAXER. 11/12/21   Einar Pheasant, MD  triamcinolone cream (KENALOG) 0.1 % Apply 1 application topically 2 (two) times daily as needed. 11/05/20   Einar Pheasant, MD    Physical Exam   Triage Vital Signs: ED Triage Vitals  Enc Vitals Group     BP 03/26/22 0012 135/89     Pulse Rate 03/26/22 0012 81     Resp 03/26/22 0012 18     Temp 03/26/22 0012 98.3 F (36.8 C)     Temp Source 03/26/22 0012 Oral     SpO2 03/26/22 0012 99 %     Weight 03/26/22 0013 203 lb (92.1 kg)     Height 03/26/22 0013 '6\' 1"'$  (1.854 m)     Head Circumference --      Peak Flow --      Pain Score 03/26/22 0012 10     Pain Loc --      Pain Edu? --      Excl. in [place]? --     Most recent vital signs: Vitals:   03/26/22 0012  BP: 135/89  Pulse: 81  Resp: 18  Temp: 98.3 F (36.8 C)  SpO2: 99%    CONSTITUTIONAL: Alert, responds appropriately to questions.  Appears uncomfortable, afebrile, nontoxic HEAD: Normocephalic, atraumatic EYES: Conjunctivae clear, pupils appear equal, sclera nonicteric ENT: normal nose; moist mucous membranes NECK: Supple, normal ROM midline spinal tenderness or step-off or deformity CARD: RRR; S1 and S2 appreciated RESP: Normal chest excursion without splinting or tachypnea; breath sounds clear and equal bilaterally; no wheezes, no rhonchi, no rales, no hypoxia or respiratory distress, speaking full sentences ABD/GI: Non-distended;  soft, non-tender, no rebound, no guarding, no peritoneal signs BACK: The back appears normal, patient does have some mid and lower lumbar spinal tenderness without step-off or deformity.  No redness, warmth, ecchymosis, soft tissue swelling, rash or other lesions present to the back. EXT: Normal ROM in all joints; no deformity noted, no edema SKIN: Normal color for age and race; warm; no rash on exposed skin NEURO: Moves all extremities equally, normal speech, +1 deep tendon reflexes in bilateral lower extremities, no  clonus, no saddle anesthesia, normal sensation in both legs PSYCH: The patient's mood and manner are appropriate.   ED Results / Procedures / Treatments   LABS: (all labs ordered are listed, but only abnormal results are displayed) Labs Reviewed - No data to display   EKG:  RADIOLOGY: My personal review and interpretation of imaging:    I have personally reviewed all radiology reports.   No results found.   PROCEDURES:  Critical Care performed: No   Procedures    IMPRESSION / MDM / ASSESSMENT AND PLAN / ED COURSE  I reviewed the triage vital signs and the nursing notes.    Patient here with exacerbation of his chronic back pain.     DIFFERENTIAL DIAGNOSIS (includes but not limited to):   Exacerbation of chronic back pain, doubt cauda equina, epidural abscess or hematoma, discitis or osteomyelitis, transverse myelitis, fracture, severe spinal stenosis   Patient's presentation is most consistent with exacerbation of chronic illness.   PLAN: Will give IV pain medication, Lidoderm patch for pain relief.  No indication for emergent imaging at this time.  Patient and wife comfortable with this plan.   MEDICATIONS GIVEN IN ED: Medications  lidocaine (LIDODERM) 5 % 1 patch (1 patch Transdermal Patch Applied 03/26/22 0145)  HYDROmorphone (DILAUDID) injection 1 mg (1 mg Intravenous Given 03/26/22 0142)  ketorolac (TORADOL) 30 MG/ML injection 30 mg (30 mg  Intravenous Given 03/26/22 0141)  ondansetron (ZOFRAN) injection 4 mg (4 mg Intravenous Given 03/26/22 0141)  HYDROmorphone (DILAUDID) injection 1 mg (1 mg Intravenous Given 03/26/22 0215)  HYDROmorphone (DILAUDID) injection 1 mg (1 mg Intravenous Given 03/26/22 0329)     ED COURSE: Patient feeling better after multiple rounds of pain medication.  Requesting discharge home.  Will discharge with brief course of oxycodone for home.  Will have him follow-up with his neurosurgeon.   At this time, I do not feel there is any life-threatening condition present. I reviewed all nursing notes, vitals, pertinent previous records.  All lab and urine results, EKGs, imaging ordered have been independently reviewed and interpreted by myself.  I reviewed all available radiology reports from any imaging ordered this visit.  Based on my assessment, I feel the patient is safe to be discharged home without further emergent workup and can continue workup as an outpatient as needed. Discussed all findings, treatment plan as well as usual and customary return precautions.  They verbalize understanding and are comfortable with this plan.  Outpatient follow-up has been provided as needed.  All questions have been answered.    CONSULTS:  none   OUTSIDE RECORDS REVIEWED: Reviewed recent neurosurgery notes in January and February 2024.       FINAL CLINICAL IMPRESSION(S) / ED DIAGNOSES   Final diagnoses:  Acute exacerbation of chronic low back pain     Rx / DC Orders   ED Discharge Orders          Ordered    oxyCODONE-acetaminophen (PERCOCET) 5-325 MG tablet  Every 6 hours PRN        03/26/22 0348    ondansetron (ZOFRAN-ODT) 4 MG disintegrating tablet  Every 6 hours PRN        03/26/22 0348    ibuprofen (ADVIL) 800 MG tablet  Every 8 hours PRN        03/26/22 0348             Note:  This document was prepared using Dragon voice recognition software and may include unintentional dictation errors.  Alliah Boulanger, Delice Bison, DO 03/26/22 [phone]

## 2022-03-26 NOTE — ED Notes (Signed)
ED Provider at bedside. 

## 2022-03-26 NOTE — Discharge Instructions (Signed)
Recommend close follow-up with your neurosurgeon for further management of your back pain.   You are being provided a prescription for opiates (also known as narcotics) for pain control.  Opiates can be addictive and should only be used when absolutely necessary for pain control when other alternatives do not work.  We recommend you only use them for the recommended amount of time and only as prescribed.  Please do not take with other sedative medications or alcohol.  Please do not drive, operate machinery, make important decisions while taking opiates.  Please note that these medications can be addictive and have high abuse potential.  Patients can become addicted to narcotics after only taking them for a few days.  Please keep these medications locked away from children, teenagers or any family members with history of substance abuse.  Narcotic pain medicine may also make you constipated.  You may use over-the-counter medications such as MiraLAX, Colace to prevent constipation.  If you become constipated, you may use over-the-counter enemas as needed.  Itching and nausea are also common side effects of narcotic pain medication.  If you develop uncontrolled vomiting or a rash, please stop these medications and seek medical care.

## 2022-03-26 NOTE — ED Notes (Signed)
Pt verbalized understanding of DC instructions. Signing pad did not work.

## 2022-03-26 NOTE — ED Notes (Signed)
Md notified that pt sts pain only decreased by 1 point.

## 2022-03-28 DIAGNOSIS — J189 Pneumonia, unspecified organism: Secondary | ICD-10-CM | POA: Diagnosis not present

## 2022-03-28 DIAGNOSIS — R0981 Nasal congestion: Secondary | ICD-10-CM | POA: Diagnosis not present

## 2022-03-28 DIAGNOSIS — R5383 Other fatigue: Secondary | ICD-10-CM | POA: Diagnosis not present

## 2022-03-28 DIAGNOSIS — J069 Acute upper respiratory infection, unspecified: Secondary | ICD-10-CM | POA: Diagnosis not present

## 2022-03-28 DIAGNOSIS — R509 Fever, unspecified: Secondary | ICD-10-CM | POA: Diagnosis not present

## 2022-03-31 ENCOUNTER — Ambulatory Visit: Payer: Medicare HMO | Admitting: Nurse Practitioner

## 2022-03-31 ENCOUNTER — Encounter: Payer: Self-pay | Admitting: Internal Medicine

## 2022-03-31 DIAGNOSIS — I10 Essential (primary) hypertension: Secondary | ICD-10-CM | POA: Diagnosis not present

## 2022-03-31 DIAGNOSIS — R7989 Other specified abnormal findings of blood chemistry: Secondary | ICD-10-CM | POA: Diagnosis not present

## 2022-03-31 DIAGNOSIS — K838 Other specified diseases of biliary tract: Secondary | ICD-10-CM | POA: Diagnosis not present

## 2022-03-31 DIAGNOSIS — T461X5A Adverse effect of calcium-channel blockers, initial encounter: Secondary | ICD-10-CM | POA: Diagnosis not present

## 2022-03-31 DIAGNOSIS — K219 Gastro-esophageal reflux disease without esophagitis: Secondary | ICD-10-CM | POA: Diagnosis not present

## 2022-03-31 DIAGNOSIS — Y92002 Bathroom of unspecified non-institutional (private) residence single-family (private) house as the place of occurrence of the external cause: Secondary | ICD-10-CM | POA: Diagnosis not present

## 2022-03-31 DIAGNOSIS — R0600 Dyspnea, unspecified: Secondary | ICD-10-CM | POA: Diagnosis not present

## 2022-03-31 DIAGNOSIS — R079 Chest pain, unspecified: Secondary | ICD-10-CM | POA: Diagnosis not present

## 2022-03-31 DIAGNOSIS — Z880 Allergy status to penicillin: Secondary | ICD-10-CM | POA: Diagnosis not present

## 2022-03-31 DIAGNOSIS — K625 Hemorrhage of anus and rectum: Secondary | ICD-10-CM | POA: Diagnosis not present

## 2022-03-31 DIAGNOSIS — E785 Hyperlipidemia, unspecified: Secondary | ICD-10-CM | POA: Diagnosis not present

## 2022-03-31 DIAGNOSIS — R42 Dizziness and giddiness: Secondary | ICD-10-CM | POA: Diagnosis not present

## 2022-03-31 DIAGNOSIS — M47816 Spondylosis without myelopathy or radiculopathy, lumbar region: Secondary | ICD-10-CM | POA: Diagnosis not present

## 2022-03-31 DIAGNOSIS — M5136 Other intervertebral disc degeneration, lumbar region: Secondary | ICD-10-CM | POA: Diagnosis not present

## 2022-03-31 DIAGNOSIS — R69 Illness, unspecified: Secondary | ICD-10-CM | POA: Diagnosis not present

## 2022-03-31 DIAGNOSIS — R55 Syncope and collapse: Secondary | ICD-10-CM | POA: Diagnosis not present

## 2022-03-31 DIAGNOSIS — K921 Melena: Secondary | ICD-10-CM | POA: Diagnosis not present

## 2022-03-31 DIAGNOSIS — T43215A Adverse effect of selective serotonin and norepinephrine reuptake inhibitors, initial encounter: Secondary | ICD-10-CM | POA: Diagnosis not present

## 2022-03-31 DIAGNOSIS — Y33XXXA Other specified events, undetermined intent, initial encounter: Secondary | ICD-10-CM | POA: Diagnosis not present

## 2022-03-31 DIAGNOSIS — W1839XA Other fall on same level, initial encounter: Secondary | ICD-10-CM | POA: Diagnosis not present

## 2022-03-31 DIAGNOSIS — I2510 Atherosclerotic heart disease of native coronary artery without angina pectoris: Secondary | ICD-10-CM | POA: Diagnosis not present

## 2022-03-31 DIAGNOSIS — E872 Acidosis, unspecified: Secondary | ICD-10-CM | POA: Diagnosis not present

## 2022-03-31 DIAGNOSIS — R0789 Other chest pain: Secondary | ICD-10-CM | POA: Diagnosis not present

## 2022-03-31 DIAGNOSIS — M059 Rheumatoid arthritis with rheumatoid factor, unspecified: Secondary | ICD-10-CM | POA: Diagnosis not present

## 2022-03-31 DIAGNOSIS — R1012 Left upper quadrant pain: Secondary | ICD-10-CM | POA: Diagnosis not present

## 2022-03-31 DIAGNOSIS — J9811 Atelectasis: Secondary | ICD-10-CM | POA: Diagnosis not present

## 2022-03-31 DIAGNOSIS — Z888 Allergy status to other drugs, medicaments and biological substances status: Secondary | ICD-10-CM | POA: Diagnosis not present

## 2022-03-31 DIAGNOSIS — R1084 Generalized abdominal pain: Secondary | ICD-10-CM | POA: Diagnosis not present

## 2022-03-31 DIAGNOSIS — G894 Chronic pain syndrome: Secondary | ICD-10-CM | POA: Diagnosis not present

## 2022-03-31 DIAGNOSIS — Z981 Arthrodesis status: Secondary | ICD-10-CM | POA: Diagnosis not present

## 2022-03-31 DIAGNOSIS — M545 Low back pain, unspecified: Secondary | ICD-10-CM | POA: Diagnosis not present

## 2022-03-31 DIAGNOSIS — S199XXA Unspecified injury of neck, initial encounter: Secondary | ICD-10-CM | POA: Diagnosis not present

## 2022-03-31 DIAGNOSIS — S299XXA Unspecified injury of thorax, initial encounter: Secondary | ICD-10-CM | POA: Diagnosis not present

## 2022-03-31 DIAGNOSIS — W228XXA Striking against or struck by other objects, initial encounter: Secondary | ICD-10-CM | POA: Diagnosis not present

## 2022-03-31 DIAGNOSIS — Z883 Allergy status to other anti-infective agents status: Secondary | ICD-10-CM | POA: Diagnosis not present

## 2022-03-31 DIAGNOSIS — S00531A Contusion of lip, initial encounter: Secondary | ICD-10-CM | POA: Diagnosis not present

## 2022-03-31 DIAGNOSIS — Z882 Allergy status to sulfonamides status: Secondary | ICD-10-CM | POA: Diagnosis not present

## 2022-03-31 NOTE — Telephone Encounter (Signed)
Agree with ER evaluation now. Would recommend ER evaluation instead of in office appt.

## 2022-03-31 NOTE — Telephone Encounter (Signed)
Attachments printed and placed in result folder

## 2022-03-31 NOTE — Telephone Encounter (Signed)
S/w pt - stated has happened more than once.  Always early morning on his way to the bathroom.  Blacks out and does not remember anything.   Denies chest pain, dizziness, sob  Feels weak, fatigued.   In [place] today, scheduled for 1:20pm w/ Charan  Pt stated will try to make it. Pt advised if he cannot make appointment, needs to go to closest ER.  Pt understood, agreed.

## 2022-04-01 ENCOUNTER — Telehealth: Payer: Self-pay | Admitting: Internal Medicine

## 2022-04-01 NOTE — Telephone Encounter (Signed)
Copied from [place] [phone]. Topic: Medicare AWV >> Apr 01, 2022 12:28 PM Lollie Marrow wrote: Reason for CRM: Called patient to schedule Medicare Annual Wellness Visit (AWV). Left message for patient to call back and schedule Medicare Annual Wellness Visit (AWV).  Last date of AWV: 12/29/2020  Please schedule an appointment at any time with Denisa, [place].  If any questions, please contact me at [phone].   Thank you,  [place] Direct dial  [phone]

## 2022-04-02 DIAGNOSIS — R1012 Left upper quadrant pain: Secondary | ICD-10-CM | POA: Diagnosis not present

## 2022-04-02 DIAGNOSIS — R079 Chest pain, unspecified: Secondary | ICD-10-CM | POA: Diagnosis not present

## 2022-04-02 DIAGNOSIS — G894 Chronic pain syndrome: Secondary | ICD-10-CM | POA: Diagnosis not present

## 2022-04-02 DIAGNOSIS — R55 Syncope and collapse: Secondary | ICD-10-CM | POA: Diagnosis not present

## 2022-04-02 DIAGNOSIS — K625 Hemorrhage of anus and rectum: Secondary | ICD-10-CM | POA: Diagnosis not present

## 2022-04-02 DIAGNOSIS — M5030 Other cervical disc degeneration, unspecified cervical region: Secondary | ICD-10-CM | POA: Diagnosis not present

## 2022-04-04 ENCOUNTER — Telehealth: Payer: Self-pay | Admitting: Internal Medicine

## 2022-04-04 NOTE — Telephone Encounter (Signed)
[name] from [place] called to make a hospital follow-up for pt. Pt scheduled for 3/11

## 2022-04-06 ENCOUNTER — Encounter: Payer: Self-pay | Admitting: Internal Medicine

## 2022-04-06 NOTE — Telephone Encounter (Signed)
I am not sure if I fully understand his message.  Trying to clarify. Reviewed hospital records.  W/up unrevealing.  Cardiac monitor - unrevealing.  CT - no acute abnormality.  They had recommended outpt f/u with GI for the rectal bleeding.   If having other acute issues, will need to be seen.

## 2022-04-06 NOTE — Telephone Encounter (Signed)
Advised patient of below. Also advised given his symptoms he needs to be evaluated more acutely. Does not need to drive. Patient stated he was going to monitor at home and will go to ED if symptoms worsen or change.

## 2022-04-07 ENCOUNTER — Telehealth: Payer: Self-pay

## 2022-04-07 NOTE — Transitions of Care (Post Inpatient/ED Visit) (Signed)
   04/07/2022  Name: [name] MRN: [id number] DOB: 1960-09-22  Today's TOC FU Call Status: Today's TOC FU Call Status:: Successful TOC FU Call Competed TOC FU Call Complete Date: 04/07/22  Transition Care Management Follow-up Telephone Call Discharge Facility: Other ([facility]) Name of Other ([place]) Discharge Facility: [facility] Type of Discharge: Inpatient Admission Primary Inpatient Discharge Diagnosis:: syncope How have you been since you were released from the hospital?: Better Any questions or concerns?: No  Items Reviewed: Did you receive and understand the discharge instructions provided?: Yes Medications obtained and verified?: Yes (Medications Reviewed) Any new allergies since your discharge?: No Dietary orders reviewed?: Yes Do you have support at home?: Yes People in Home: spouse  Home Care and Equipment/Supplies: [place] Ordered?: NA Any new equipment or medical supplies ordered?: NA  Functional Questionnaire: Do you need assistance with bathing/showering or dressing?: No Do you need assistance with meal preparation?: No Do you need assistance with eating?: No Do you have difficulty maintaining continence: No Do you need assistance with getting out of bed/getting out of a chair/moving?: No Do you have difficulty managing or taking your medications?: No  Folllow up appointments reviewed: PCP Follow-up appointment confirmed?: Yes Date of PCP follow-up appointment?: 04/11/22 Follow-up Provider: Dr Nicki Reaper [facility] Follow-up appointment confirmed?: NA Do you need transportation to your follow-up appointment?: No Do you understand care options if your condition(s) worsen?: Yes-patient verbalized understanding    [place], [place] Nurse Health Advisor Direct Dial [phone]

## 2022-04-11 ENCOUNTER — Ambulatory Visit (INDEPENDENT_AMBULATORY_CARE_PROVIDER_SITE_OTHER): Payer: Medicare HMO | Admitting: Internal Medicine

## 2022-04-11 ENCOUNTER — Encounter: Payer: Self-pay | Admitting: Internal Medicine

## 2022-04-11 VITALS — BP 124/76 | HR 90 | Temp 98.0°F | Resp 16 | Ht 73.0 in | Wt 198.0 lb

## 2022-04-11 DIAGNOSIS — R1010 Upper abdominal pain, unspecified: Secondary | ICD-10-CM

## 2022-04-11 DIAGNOSIS — E7800 Pure hypercholesterolemia, unspecified: Secondary | ICD-10-CM

## 2022-04-11 DIAGNOSIS — I10 Essential (primary) hypertension: Secondary | ICD-10-CM | POA: Diagnosis not present

## 2022-04-11 DIAGNOSIS — I77819 Aortic ectasia, unspecified site: Secondary | ICD-10-CM | POA: Diagnosis not present

## 2022-04-11 DIAGNOSIS — F320 Major depressive disorder, single episode, mild: Secondary | ICD-10-CM

## 2022-04-11 DIAGNOSIS — R55 Syncope and collapse: Secondary | ICD-10-CM | POA: Diagnosis not present

## 2022-04-11 DIAGNOSIS — Z8719 Personal history of other diseases of the digestive system: Secondary | ICD-10-CM

## 2022-04-11 DIAGNOSIS — G8929 Other chronic pain: Secondary | ICD-10-CM | POA: Diagnosis not present

## 2022-04-11 DIAGNOSIS — R69 Illness, unspecified: Secondary | ICD-10-CM | POA: Diagnosis not present

## 2022-04-11 DIAGNOSIS — M069 Rheumatoid arthritis, unspecified: Secondary | ICD-10-CM

## 2022-04-11 DIAGNOSIS — D649 Anemia, unspecified: Secondary | ICD-10-CM | POA: Diagnosis not present

## 2022-04-11 DIAGNOSIS — M545 Low back pain, unspecified: Secondary | ICD-10-CM | POA: Diagnosis not present

## 2022-04-11 DIAGNOSIS — R7989 Other specified abnormal findings of blood chemistry: Secondary | ICD-10-CM

## 2022-04-11 LAB — CBC WITH DIFFERENTIAL/PLATELET
Basophils Absolute: 0.1 10*3/uL (ref 0.0–0.1)
Basophils Relative: 0.8 % (ref 0.0–3.0)
Eosinophils Absolute: 0.1 10*3/uL (ref 0.0–0.7)
Eosinophils Relative: 1.9 % (ref 0.0–5.0)
HCT: 40.9 % (ref 39.0–52.0)
Hemoglobin: 13.9 g/dL (ref 13.0–17.0)
Lymphocytes Relative: 27 % (ref 12.0–46.0)
Lymphs Abs: 1.7 10*3/uL (ref 0.7–4.0)
MCHC: 34 g/dL (ref 30.0–36.0)
MCV: 93.2 fl (ref 78.0–100.0)
Monocytes Absolute: 0.4 10*3/uL (ref 0.1–1.0)
Monocytes Relative: 7.1 % (ref 3.0–12.0)
Neutro Abs: 3.9 10*3/uL (ref 1.4–7.7)
Neutrophils Relative %: 63.2 % (ref 43.0–77.0)
Platelets: 354 10*3/uL (ref 150.0–400.0)
RBC: 4.39 Mil/uL (ref 4.22–5.81)
RDW: 12.8 % (ref 11.5–15.5)
WBC: 6.2 10*3/uL (ref 4.0–10.5)

## 2022-04-11 LAB — HEPATIC FUNCTION PANEL
ALT: 38 U/L (ref 0–53)
AST: 27 U/L (ref 0–37)
Albumin: 4.1 g/dL (ref 3.5–5.2)
Alkaline Phosphatase: 157 U/L — ABNORMAL HIGH (ref 39–117)
Bilirubin, Direct: 0.1 mg/dL (ref 0.0–0.3)
Total Bilirubin: 0.3 mg/dL (ref 0.2–1.2)
Total Protein: 7.5 g/dL (ref 6.0–8.3)

## 2022-04-11 LAB — BASIC METABOLIC PANEL
BUN: 17 mg/dL (ref 6–23)
CO2: 27 mEq/L (ref 19–32)
Calcium: 9.5 mg/dL (ref 8.4–10.5)
Chloride: 103 mEq/L (ref 96–112)
Creatinine, Ser: 0.97 mg/dL (ref 0.40–1.50)
GFR: 85.37 mL/min (ref >60.00)
Glucose, Bld: 97 mg/dL (ref 70–99)
Potassium: 4.4 mEq/L (ref 3.5–5.1)
Sodium: 136 mEq/L (ref 135–145)

## 2022-04-11 NOTE — Progress Notes (Signed)
Subjective:    Patient ID: [name], male    DOB: 1960/06/13, 62 y.o.   MRN: [id number]  Patient here for  Chief Complaint  Patient presents with   Hospitalization Follow-up    HPI Here for hospital follow up. Recently admitted for syncope.  Note reviewed - admitted 03/31/22 - LUQ abdominal pain and bloody/black stools.  Extensive w/up - CT brain and MRI - no acute abnormality.  CT cervical, thoracic and lumbar spine - no acute findings.  Cardiac stress testing - normal.  Cardiac telemetry - no arrhythmia.  CT abdomen - no acute abnormality.  Mild intra and extrahepatic ductal dilatation.  Felt likely to be related to cholecystectomy.  CT scan chest - no PE.  Trazodone and amlodipine - stopped. He reports since being out of the hospital, he feels better.  He is eating better.  No abdominal pain. He stopped all of his vitamins and supplements.  Still decreased energy, but overall improved.  Discussed f/u with GI.  No chest pain or sob.  No further syncopal episodes.    Past Medical History:  Diagnosis Date   Allergy    Anoxic brain injury [facility])    Arthritis    Awareness under anesthesia    need a lot to put me under   Chronic back pain    s/p 5 back surgeries   Coronary artery disease    Cardiac catheterization in 2007 at [facility] showed 30% stenosis in proximal LAD. No other disease. Ejection fraction was 65%   Depression    H/O acute pancreatitis    Hypercholesterolemia    Hypertension    Mild cognitive impairment with memory loss    Pancreatitis    Past Surgical History:  Procedure Laterality Date   abdomial surgery     BACK SURGERY     multiple back surgeries (5)   CARDIAC CATHETERIZATION     [facility]    CHOLECYSTECTOMY  2013   COLONOSCOPY N/A 03/05/2021   Procedure: COLONOSCOPY;  Surgeon: Annamaria Helling, DO;  Location: [facility] ENDOSCOPY;  Service: Gastroenterology;  Laterality: N/A;  IDDM   COLONOSCOPY WITH PROPOFOL N/A 06/30/2015   Procedure: COLONOSCOPY WITH PROPOFOL;   Surgeon: Lollie Sails, MD;  Location: [facility] ENDOSCOPY;  Service: Endoscopy;  Laterality: N/A;   ESOPHAGOGASTRODUODENOSCOPY (EGD) WITH PROPOFOL N/A 03/30/2016   Procedure: ESOPHAGOGASTRODUODENOSCOPY (EGD) WITH PROPOFOL;  Surgeon: Manya Silvas, MD;  Location: [facility] ENDOSCOPY;  Service: Endoscopy;  Laterality: N/A;   FUNCTIONAL ENDOSCOPIC SINUS SURGERY     JOINT REPLACEMENT     LAMINECTOMY     LUMBAR SPINE SURGERY     SHOULDER ARTHROSCOPY WITH ROTATOR CUFF REPAIR AND OPEN BICEPS TENODESIS Right    SHOULDER ARTHROSCOPY WITH ROTATOR CUFF REPAIR AND OPEN BICEPS TENODESIS     SPINE SURGERY     TRIGGER POINT INJECTION     VASECTOMY     With Reversal   VEIN SURGERY Right 2013   arm   Family History  Problem Relation Age of Onset   Heart disease Father    Heart attack Father    Heart disease Mother    Heart attack Mother    Heart attack Brother        MI's x 2    Hypertension Other    Colon cancer Other    Hypertension Sister    Heart attack Paternal Grandfather    Hypertension Sister    Social History   Socioeconomic History   Marital status: Divorced  Spouse name: Not on file   Number of children: 3   Years of education: Not on file   Highest education level: Not on file  Occupational History   Not on file  Tobacco Use   Smoking status: Never   Smokeless tobacco: Never  Vaping Use   Vaping Use: Never used  Substance and Sexual Activity   Alcohol use: No    Alcohol/week: 0.0 standard drinks of alcohol   Drug use: No   Sexual activity: Yes  Other Topics Concern   Not on file  Social History Narrative   Not on file   Social Determinants of Health   Financial Resource Strain: Low Risk  (12/29/2020)   Overall Financial Resource Strain (CARDIA)    Difficulty of Paying Living Expenses: Not hard at all  Food Insecurity: No Food Insecurity (12/29/2020)   Hunger Vital Sign    Worried About Running Out of Food in the Last Year: Never true    Ran Out of Food in the  Last Year: Never true  Transportation Needs: No Transportation Needs (12/29/2020)   PRAPARE - Hydrologist (Medical): No    Lack of Transportation (Non-Medical): No  Physical Activity: Not on file  Stress: No Stress Concern Present (12/29/2020)   [place]    Feeling of Stress : Not at all  Social Connections: Unknown (12/29/2020)   Social Connection and Isolation Panel [NHANES]    Frequency of Communication with Friends and Family: More than three times a week    Frequency of Social Gatherings with Friends and Family: More than three times a week    Attends Religious Services: Not on Advertising copywriter or Organizations: Not on file    Attends Archivist Meetings: Not on file    Marital Status: Not on file     Review of Systems  Constitutional:  Positive for fatigue.       Appetite is improving.   HENT:  Negative for congestion and sinus pressure.   Respiratory:  Negative for cough, chest tightness and shortness of breath.   Cardiovascular:  Negative for chest pain and palpitations.  Gastrointestinal:  Negative for abdominal pain, diarrhea, nausea and vomiting.  Genitourinary:  Negative for difficulty urinating and dysuria.  Musculoskeletal:  Negative for joint swelling and myalgias.       Chronic back pain.   Skin:  Negative for color change and rash.  Neurological:  Negative for dizziness and headaches.  Psychiatric/Behavioral:  Negative for agitation and dysphoric mood.        Objective:     BP 124/76   Pulse 90   Temp 98 F (36.7 C)   Resp 16   Ht 6\' 1"  (1.854 m)   Wt 198 lb (89.8 kg)   SpO2 98%   BMI 26.12 kg/m  Wt Readings from Last 3 Encounters:  04/11/22 198 lb (89.8 kg)  03/26/22 203 lb (92.1 kg)  03/07/22 204 lb (92.5 kg)    Physical Exam Vitals reviewed.  Constitutional:      General: He is not in acute distress.    Appearance:  Normal appearance. He is well-developed.  HENT:     Head: Normocephalic and atraumatic.     Right Ear: External ear normal.     Left Ear: External ear normal.  Eyes:     General: No scleral icterus.       Right  eye: No discharge.        Left eye: No discharge.     Conjunctiva/sclera: Conjunctivae normal.  Cardiovascular:     Rate and Rhythm: Normal rate and regular rhythm.  Pulmonary:     Effort: Pulmonary effort is normal. No respiratory distress.     Breath sounds: Normal breath sounds.  Abdominal:     General: Bowel sounds are normal.     Palpations: Abdomen is soft.     Tenderness: There is no abdominal tenderness.  Musculoskeletal:        General: No swelling or tenderness.     Cervical back: Neck supple. No tenderness.  Lymphadenopathy:     Cervical: No cervical adenopathy.  Skin:    Findings: No erythema or rash.  Neurological:     Mental Status: He is alert.  Psychiatric:        Mood and Affect: Mood normal.        Behavior: Behavior normal.      Outpatient Encounter Medications as of 04/11/2022  Medication Sig   tamsulosin (FLOMAX) 0.4 MG CAPS capsule Take 0.4 mg by mouth daily.   traZODone (DESYREL) 100 MG tablet TAKE 1 TABLET (100 MG TOTAL) BY MOUTH AT BEDTIME. DO NOT TAKE WITH OXYCODONE OR MUSCLE RELAXER.   Adalimumab 40 MG/0.4ML PNKT Inject 40 mg into the skin every 14 (fourteen) days.   amLODipine (NORVASC) 5 MG tablet TAKE 1 TABLET BY MOUTH TWICE A DAY (Patient not taking: Reported on 04/07/2022)   atomoxetine (STRATTERA) 100 MG capsule Take 100 mg by mouth daily.   gabapentin (NEURONTIN) 300 MG capsule Take 1 or 2 capsules by mouth TID   modafinil (PROVIGIL) 200 MG tablet Take 400 mg by mouth in the morning.   ondansetron (ZOFRAN-ODT) 4 MG disintegrating tablet Take 1 tablet (4 mg total) by mouth every 6 (six) hours as needed for nausea or vomiting.   oxyCODONE (OXY IR/ROXICODONE) 5 MG immediate release tablet Take 5-10 mg by mouth at bedtime as needed for  moderate pain.    oxyCODONE-acetaminophen (PERCOCET) 5-325 MG tablet Take 2 tablets by mouth every 6 (six) hours as needed for severe pain.   rosuvastatin (CRESTOR) 5 MG tablet TAKE 1 TABLET BY MOUTH EVERY DAY   sildenafil (REVATIO) 20 MG tablet TAKE 4 - 5 TABLETS BY MOUTH DAILY AS NEEDED   sucralfate (CARAFATE) 1 g tablet Take 1 g by mouth daily.    tiZANidine (ZANAFLEX) 4 MG tablet TAKE 1 - 2 TABLETS BY MOUTH THREE TIMES DAILY AS NEEDED   [DISCONTINUED] fluticasone (FLONASE) 50 MCG/ACT nasal spray Place 1 spray into both nostrils 2 (two) times daily. (Patient not taking: Reported on 04/07/2022)   [DISCONTINUED] hpv 9-valent vaccine (GARDASIL 9) SUSP injection Inject 0.5 mLs into the muscle once. To be injected by pharmacist at pharmacy (Patient not taking: Reported on 04/07/2022)   [DISCONTINUED] ibuprofen (ADVIL) 800 MG tablet Take 1 tablet (800 mg total) by mouth every 8 (eight) hours as needed. (Patient not taking: Reported on 04/07/2022)   [DISCONTINUED] triamcinolone cream (KENALOG) 0.1 % Apply 1 application topically 2 (two) times daily as needed.   No facility-administered encounter medications on file as of 04/11/2022.     Lab Results  Component Value Date   WBC 6.2 04/11/2022   HGB 13.9 04/11/2022   HCT 40.9 04/11/2022   PLT 354.0 04/11/2022   GLUCOSE 97 04/11/2022   CHOL 141 03/09/2022   TRIG 92.0 03/09/2022   HDL 56.00 03/09/2022   LDLCALC 66  03/09/2022   ALT 38 04/11/2022   AST 27 04/11/2022   NA 136 04/11/2022   K 4.4 04/11/2022   CL 103 04/11/2022   CREATININE 0.97 04/11/2022   BUN 17 04/11/2022   CO2 27 04/11/2022   TSH 0.99 03/07/2022   PSA 0.53 11/02/2021   INR 1.0 09/24/2019   HGBA1C 5.6 09/23/2019    Reviewed and confirm medications taking.  Reports back on amlodipine.      Assessment & Plan:  Hypercholesteremia Assessment & Plan: Low cholesterol diet and exercise.  Continue crestor.  Check lipid panel and liver function tests.    Orders: -     Hepatic  function panel -     Basic metabolic panel  Anemia, unspecified type Assessment & Plan: Recent admission for bloody/black stool.  W/up as outlined.  Recheck cbc today.  Contact GI regarding question of need for further w/up (luminal evaluation, etc).   Orders: -     CBC with Differential/Platelet  Aortic dilatation [facility]) Assessment & Plan: Aortic root ectasia - 4cm (CT chest - [facility] 03/31/2022).  Followed by cardiology.    Pain of upper abdomen Assessment & Plan: Extensive w/up as outlined.  No abdominal pain now.  Eating better.  Feels better.  CT abdomen - no acute abnormality.  Mild intra and extrahepatic ductal dilatation.  Felt likely to be related to cholecystectomy.  Recheck liver panel.  Has had persistent elevated alkaline phosphatase.  Has been stable.  His FibroScan score on 03/2020 has been low and reassuring and work-up for biliary disease activity both by serologies and antimitochondrial antibody - March 2023) and radiologically have been negative. Discuss with GI - Dr Bonita Quin [phone] option 2 - regarding further w/up - including question of MRCP, etc.  Also with recent admission for bloody/black stool - question of need for further luminal evaluation    Abnormal liver function tests Assessment & Plan: Found to have elevated alkaline phos.  Evaluated by GI previously.  Currently being followed - hepatology clinic. Recent fibroscan - no fibrosis.  Recommended repeating his transient elastography score every 3 years so long as alk phos remains abnormal. See above.  With recent admission, question of need for further evaluation/w/up.     Chronic low back pain, unspecified back pain laterality, unspecified whether sciatica present Assessment & Plan: Followed by pain clinic/[facility].  Stable. Seeing Dr Claudia Desanctis - f/u low back pain and neck pain.  Last seen 03/2022 - pain fairly controlled with medications, exercise and trigger point injections.  Received trigger point  injections and recommended to continue gabapentin and tizanidine.   Depression, major, single episode, mild [facility]) Assessment & Plan: Followed by Dr Toy Care.  Appears to be doing relatively well.  Follow.    History of pancreatitis Assessment & Plan: No abdominal pain currently.  Eating better.  W/up during recent hospitalization as outlined.     Primary hypertension Assessment & Plan: Amlodipine was stopped during hospitalization.  He restarted.  Was monitoring his blood pressure and it started elevating. Follow pressures. Follow metabolic panel.     Rheumatoid arthritis, involving unspecified site, unspecified whether rheumatoid factor present [facility]) Assessment & Plan: Being followed by rheumatology.  On humira.  Stable.    Syncope, unspecified syncope type Assessment & Plan: Recently admitted for syncope.  Note reviewed - admitted 03/31/22 - LUQ abdominal pain and bloody/black stools.  Extensive w/up - CT brain and MRI - no acute abnormality.  CT cervical, thoracic and lumbar spine - no acute  findings.  Cardiac stress testing - normal.  Cardiac telemetry - no arrhythmia.  CT abdomen - no acute abnormality.  Mild intra and extrahepatic ductal dilatation.  Felt likely to be related to cholecystectomy.  CT scan chest - no PE.  Trazodone and amlodipine - stopped. He reports since being out of the hospital, he feels better.  He is eating better.  Had syncopal episodes prior to this admission.  Seeing cardiology.  Refer to Dr Saralyn Pilar last note.  He has restarted amlodipine.  Blood pressure ok now.  No low pressures.  Reports these episodes have only occurred when he is feeling bad/gets up to go to the bathroom and that he can sense when they are going to occur.  Cardiology felt no further w/up warranted.  Follow.        Einar Pheasant, MD

## 2022-04-17 ENCOUNTER — Encounter: Payer: Self-pay | Admitting: Internal Medicine

## 2022-04-17 NOTE — Assessment & Plan Note (Addendum)
Followed by pain clinic/[facility].  Stable. Seeing Dr Claudia Desanctis - f/u low back pain and neck pain.  Last seen 03/2022 - pain fairly controlled with medications, exercise and trigger point injections.  Received trigger point injections and recommended to continue gabapentin and tizanidine.

## 2022-04-17 NOTE — Assessment & Plan Note (Signed)
Extensive w/up as outlined.  No abdominal pain now.  Eating better.  Feels better.  CT abdomen - no acute abnormality.  Mild intra and extrahepatic ductal dilatation.  Felt likely to be related to cholecystectomy.  Recheck liver panel.  Has had persistent elevated alkaline phosphatase.  Has been stable.  His FibroScan score on 03/2020 has been low and reassuring and work-up for biliary disease activity both by serologies and antimitochondrial antibody - March 2023) and radiologically have been negative. Discuss with GI - Dr Bonita Quin [phone] option 2 - regarding further w/up - including question of MRCP, etc.  Also with recent admission for bloody/black stool - question of need for further luminal evaluation

## 2022-04-17 NOTE — Assessment & Plan Note (Signed)
Recently admitted for syncope.  Note reviewed - admitted 03/31/22 - LUQ abdominal pain and bloody/black stools.  Extensive w/up - CT brain and MRI - no acute abnormality.  CT cervical, thoracic and lumbar spine - no acute findings.  Cardiac stress testing - normal.  Cardiac telemetry - no arrhythmia.  CT abdomen - no acute abnormality.  Mild intra and extrahepatic ductal dilatation.  Felt likely to be related to cholecystectomy.  CT scan chest - no PE.  Trazodone and amlodipine - stopped. He reports since being out of the hospital, he feels better.  He is eating better.  Had syncopal episodes prior to this admission.  Seeing cardiology.  Refer to Dr Saralyn Pilar last note.  He has restarted amlodipine.  Blood pressure ok now.  No low pressures.  Reports these episodes have only occurred when he is feeling bad/gets up to go to the bathroom and that he can sense when they are going to occur.  Cardiology felt no further w/up warranted.  Follow.

## 2022-04-17 NOTE — Assessment & Plan Note (Addendum)
Recent admission for bloody/black stool.  W/up as outlined.  Recheck cbc today.  Contact GI regarding question of need for further w/up (luminal evaluation, etc).

## 2022-04-17 NOTE — Assessment & Plan Note (Addendum)
Aortic root ectasia - 4cm (CT chest - [facility] 03/31/2022).  Followed by cardiology.

## 2022-04-17 NOTE — Assessment & Plan Note (Signed)
Followed by Dr Kaur.  Appears to be doing relatively well.  Follow.  

## 2022-04-17 NOTE — Assessment & Plan Note (Signed)
No abdominal pain currently.  Eating better.  W/up during recent hospitalization as outlined.

## 2022-04-17 NOTE — Assessment & Plan Note (Signed)
Amlodipine was stopped during hospitalization.  He restarted.  Was monitoring his blood pressure and it started elevating. Follow pressures. Follow metabolic panel.

## 2022-04-17 NOTE — Assessment & Plan Note (Signed)
Low cholesterol diet and exercise.  Continue crestor.  Check lipid panel and liver function tests.   

## 2022-04-17 NOTE — Assessment & Plan Note (Signed)
Found to have elevated alkaline phos.  Evaluated by GI previously.  Currently being followed - hepatology clinic. Recent fibroscan - no fibrosis.  Recommended repeating his transient elastography score every 3 years so long as alk phos remains abnormal. See above.  With recent admission, question of need for further evaluation/w/up.

## 2022-04-17 NOTE — Assessment & Plan Note (Signed)
Being followed by rheumatology.  On humira.  Stable.  

## 2022-04-20 DIAGNOSIS — Z9049 Acquired absence of other specified parts of digestive tract: Secondary | ICD-10-CM | POA: Diagnosis not present

## 2022-04-20 DIAGNOSIS — Z79899 Other long term (current) drug therapy: Secondary | ICD-10-CM | POA: Diagnosis not present

## 2022-04-20 DIAGNOSIS — G894 Chronic pain syndrome: Secondary | ICD-10-CM | POA: Diagnosis not present

## 2022-04-20 DIAGNOSIS — Z8719 Personal history of other diseases of the digestive system: Secondary | ICD-10-CM | POA: Diagnosis not present

## 2022-04-20 DIAGNOSIS — M545 Low back pain, unspecified: Secondary | ICD-10-CM | POA: Diagnosis not present

## 2022-04-20 DIAGNOSIS — M546 Pain in thoracic spine: Secondary | ICD-10-CM | POA: Diagnosis not present

## 2022-04-20 DIAGNOSIS — M79602 Pain in left arm: Secondary | ICD-10-CM | POA: Diagnosis not present

## 2022-04-20 DIAGNOSIS — M069 Rheumatoid arthritis, unspecified: Secondary | ICD-10-CM | POA: Diagnosis not present

## 2022-04-20 DIAGNOSIS — M7918 Myalgia, other site: Secondary | ICD-10-CM | POA: Diagnosis not present

## 2022-04-20 DIAGNOSIS — M5136 Other intervertebral disc degeneration, lumbar region: Secondary | ICD-10-CM | POA: Diagnosis not present

## 2022-05-04 ENCOUNTER — Other Ambulatory Visit: Payer: Self-pay | Admitting: Internal Medicine

## 2022-06-07 ENCOUNTER — Telehealth: Payer: Self-pay | Admitting: Internal Medicine

## 2022-06-07 NOTE — Telephone Encounter (Signed)
Copied from CRM [id number]. Topic: Medicare AWV >> Jun 07, 2022 12:57 PM Payton Doughty wrote: Reason for CRM: Called patient to schedule Medicare Annual Wellness Visit (AWV). Left message for patient to call back and schedule Medicare Annual Wellness Visit (AWV).  Last date of AWV: 12/29/20  Please schedule an appointment at any time with Annabell Sabal, CMA  .  If any questions, please contact me.  Thank you ,  Verlee Rossetti; Care Guide Ambulatory Clinical Support [facility] Direct Dial: [phone]

## 2022-07-04 DIAGNOSIS — M47816 Spondylosis without myelopathy or radiculopathy, lumbar region: Secondary | ICD-10-CM | POA: Diagnosis not present

## 2022-07-04 DIAGNOSIS — G894 Chronic pain syndrome: Secondary | ICD-10-CM | POA: Diagnosis not present

## 2022-07-04 DIAGNOSIS — M069 Rheumatoid arthritis, unspecified: Secondary | ICD-10-CM | POA: Diagnosis not present

## 2022-07-04 DIAGNOSIS — M5030 Other cervical disc degeneration, unspecified cervical region: Secondary | ICD-10-CM | POA: Diagnosis not present

## 2022-07-04 DIAGNOSIS — M5136 Other intervertebral disc degeneration, lumbar region: Secondary | ICD-10-CM | POA: Diagnosis not present

## 2022-07-04 DIAGNOSIS — Z79899 Other long term (current) drug therapy: Secondary | ICD-10-CM | POA: Diagnosis not present

## 2022-07-04 DIAGNOSIS — M7918 Myalgia, other site: Secondary | ICD-10-CM | POA: Diagnosis not present

## 2022-07-04 DIAGNOSIS — M19012 Primary osteoarthritis, left shoulder: Secondary | ICD-10-CM | POA: Diagnosis not present

## 2022-07-04 DIAGNOSIS — M19011 Primary osteoarthritis, right shoulder: Secondary | ICD-10-CM | POA: Diagnosis not present

## 2022-07-04 DIAGNOSIS — M170 Bilateral primary osteoarthritis of knee: Secondary | ICD-10-CM | POA: Diagnosis not present

## 2022-07-04 DIAGNOSIS — J811 Chronic pulmonary edema: Secondary | ICD-10-CM | POA: Diagnosis not present

## 2022-07-04 DIAGNOSIS — Z8719 Personal history of other diseases of the digestive system: Secondary | ICD-10-CM | POA: Diagnosis not present

## 2022-07-04 DIAGNOSIS — M25522 Pain in left elbow: Secondary | ICD-10-CM | POA: Diagnosis not present

## 2022-07-04 DIAGNOSIS — J90 Pleural effusion, not elsewhere classified: Secondary | ICD-10-CM | POA: Diagnosis not present

## 2022-07-06 ENCOUNTER — Ambulatory Visit (INDEPENDENT_AMBULATORY_CARE_PROVIDER_SITE_OTHER): Payer: Medicare HMO | Admitting: Internal Medicine

## 2022-07-06 ENCOUNTER — Encounter: Payer: Self-pay | Admitting: Internal Medicine

## 2022-07-06 VITALS — BP 122/78 | HR 84 | Temp 97.9°F | Resp 16 | Ht 73.0 in | Wt 201.0 lb

## 2022-07-06 DIAGNOSIS — D649 Anemia, unspecified: Secondary | ICD-10-CM | POA: Diagnosis not present

## 2022-07-06 DIAGNOSIS — Z8719 Personal history of other diseases of the digestive system: Secondary | ICD-10-CM

## 2022-07-06 DIAGNOSIS — R7989 Other specified abnormal findings of blood chemistry: Secondary | ICD-10-CM

## 2022-07-06 DIAGNOSIS — F320 Major depressive disorder, single episode, mild: Secondary | ICD-10-CM

## 2022-07-06 DIAGNOSIS — Z0000 Encounter for general adult medical examination without abnormal findings: Secondary | ICD-10-CM | POA: Diagnosis not present

## 2022-07-06 DIAGNOSIS — I77819 Aortic ectasia, unspecified site: Secondary | ICD-10-CM | POA: Diagnosis not present

## 2022-07-06 DIAGNOSIS — M545 Low back pain, unspecified: Secondary | ICD-10-CM | POA: Diagnosis not present

## 2022-07-06 DIAGNOSIS — N5089 Other specified disorders of the male genital organs: Secondary | ICD-10-CM | POA: Diagnosis not present

## 2022-07-06 DIAGNOSIS — G8929 Other chronic pain: Secondary | ICD-10-CM | POA: Diagnosis not present

## 2022-07-06 DIAGNOSIS — Z9889 Other specified postprocedural states: Secondary | ICD-10-CM | POA: Diagnosis not present

## 2022-07-06 DIAGNOSIS — R55 Syncope and collapse: Secondary | ICD-10-CM | POA: Diagnosis not present

## 2022-07-06 DIAGNOSIS — R002 Palpitations: Secondary | ICD-10-CM | POA: Diagnosis not present

## 2022-07-06 DIAGNOSIS — M069 Rheumatoid arthritis, unspecified: Secondary | ICD-10-CM

## 2022-07-06 DIAGNOSIS — I10 Essential (primary) hypertension: Secondary | ICD-10-CM | POA: Diagnosis not present

## 2022-07-06 DIAGNOSIS — E7800 Pure hypercholesterolemia, unspecified: Secondary | ICD-10-CM

## 2022-07-06 DIAGNOSIS — R000 Tachycardia, unspecified: Secondary | ICD-10-CM | POA: Diagnosis not present

## 2022-07-06 LAB — CBC WITH DIFFERENTIAL/PLATELET
Basophils Absolute: 0.1 10*3/uL (ref 0.0–0.1)
Basophils Relative: 1 % (ref 0.0–3.0)
Eosinophils Absolute: 0.1 10*3/uL (ref 0.0–0.7)
Eosinophils Relative: 2.4 % (ref 0.0–5.0)
HCT: 41.8 % (ref 39.0–52.0)
Hemoglobin: 14.1 g/dL (ref 13.0–17.0)
Lymphocytes Relative: 24.9 % (ref 12.0–46.0)
Lymphs Abs: 1.4 10*3/uL (ref 0.7–4.0)
MCHC: 33.7 g/dL (ref 30.0–36.0)
MCV: 92.9 fl (ref 78.0–100.0)
Monocytes Absolute: 0.7 10*3/uL (ref 0.1–1.0)
Monocytes Relative: 12.2 % — ABNORMAL HIGH (ref 3.0–12.0)
Neutro Abs: 3.4 10*3/uL (ref 1.4–7.7)
Neutrophils Relative %: 59.5 % (ref 43.0–77.0)
Platelets: 329 10*3/uL (ref 150.0–400.0)
RBC: 4.5 Mil/uL (ref 4.22–5.81)
RDW: 12.9 % (ref 11.5–15.5)
WBC: 5.7 10*3/uL (ref 4.0–10.5)

## 2022-07-06 LAB — BASIC METABOLIC PANEL
BUN: 19 mg/dL (ref 6–23)
CO2: 24 mEq/L (ref 19–32)
Calcium: 9.2 mg/dL (ref 8.4–10.5)
Chloride: 103 mEq/L (ref 96–112)
Creatinine, Ser: 0.92 mg/dL (ref 0.40–1.50)
GFR: 90.82 mL/min (ref >60.00)
Glucose, Bld: 102 mg/dL — ABNORMAL HIGH (ref 70–99)
Potassium: 4.1 mEq/L (ref 3.5–5.1)
Sodium: 139 mEq/L (ref 135–145)

## 2022-07-06 LAB — HEPATIC FUNCTION PANEL
ALT: 31 U/L (ref 0–53)
AST: 27 U/L (ref 0–37)
Albumin: 4.4 g/dL (ref 3.5–5.2)
Alkaline Phosphatase: 127 U/L — ABNORMAL HIGH (ref 39–117)
Bilirubin, Direct: 0.1 mg/dL (ref 0.0–0.3)
Total Bilirubin: 0.3 mg/dL (ref 0.2–1.2)
Total Protein: 7.7 g/dL (ref 6.0–8.3)

## 2022-07-06 LAB — LIPID PANEL
Cholesterol: 131 mg/dL (ref 0–200)
HDL: 53.1 mg/dL (ref >39.00)
LDL Cholesterol: 58 mg/dL (ref 0–99)
NonHDL: 77.99
Total CHOL/HDL Ratio: 2
Triglycerides: 99 mg/dL (ref 0.0–149.0)
VLDL: 19.8 mg/dL (ref 0.0–40.0)

## 2022-07-06 LAB — LIPASE: Lipase: 34 U/L (ref 11.0–59.0)

## 2022-07-06 LAB — TESTOSTERONE: Testosterone: 515.21 ng/dL (ref 300.00–890.00)

## 2022-07-06 MED ORDER — ROSUVASTATIN CALCIUM 5 MG PO TABS: 5.0000 mg | ORAL_TABLET | Freq: Every day | ORAL | 3 refills | Status: DC

## 2022-07-06 MED ORDER — TRAZODONE HCL 100 MG PO TABS: 100.0000 mg | ORAL_TABLET | Freq: Every day | ORAL | 1 refills | Status: DC

## 2022-07-06 MED ORDER — AMLODIPINE BESYLATE 5 MG PO TABS: 5.0000 mg | ORAL_TABLET | Freq: Two times a day (BID) | ORAL | 1 refills | Status: DC

## 2022-07-06 NOTE — Progress Notes (Signed)
Subjective:    Patient ID: [name], male    DOB: [birth year], 62 y.o.   MRN: [id number]  Patient here for  Chief Complaint  Patient presents with   Annual Exam    HPI Here for physical exam. Previously admitted for syncope.  Note reviewed - admitted 03/31/22 - LUQ abdominal pain and bloody/black stools.  Extensive w/up - CT brain and MRI - no acute abnormality.  CT cervical, thoracic and lumbar spine - no acute findings.  Cardiac stress testing - normal.  Cardiac telemetry - no arrhythmia.  CT abdomen - no acute abnormality.  Mild intra and extrahepatic ductal dilatation.  Felt likely to be related to cholecystectomy.  CT scan chest - no PE.  Trazodone and amlodipine - stopped. Is doing better.  Feels better.  No increased heart rate or palpitations.  No chest pain.  Breathing stable.  No syncope or near syncope.  Blood pressure doing better.  Seeing psychiatry.  Feeling better.    Past Medical History:  Diagnosis Date   Allergy    Anoxic brain injury [facility])    Arthritis    Awareness under anesthesia    need a lot to put me under   Chronic back pain    s/p 5 back surgeries   Coronary artery disease    Cardiac catheterization in 2007 at [facility] showed 30% stenosis in proximal LAD. No other disease. Ejection fraction was 65%   Depression    H/O acute pancreatitis    Hypercholesterolemia    Hypertension    Mild cognitive impairment with memory loss    Pancreatitis    Past Surgical History:  Procedure Laterality Date   abdomial surgery     BACK SURGERY     multiple back surgeries (5)   CARDIAC CATHETERIZATION     [facility]    CHOLECYSTECTOMY  2013   COLONOSCOPY N/A 03/05/2021   Procedure: COLONOSCOPY;  Surgeon: Jaynie Collins, DO;  Location: [facility] ENDOSCOPY;  Service: Gastroenterology;  Laterality: N/A;  IDDM   COLONOSCOPY WITH PROPOFOL N/A 06/30/2015   Procedure: COLONOSCOPY WITH PROPOFOL;  Surgeon: Christena Deem, MD;  Location: [facility] ENDOSCOPY;  Service: Endoscopy;   Laterality: N/A;   ESOPHAGOGASTRODUODENOSCOPY (EGD) WITH PROPOFOL N/A 03/30/2016   Procedure: ESOPHAGOGASTRODUODENOSCOPY (EGD) WITH PROPOFOL;  Surgeon: Scot Jun, MD;  Location: [facility] ENDOSCOPY;  Service: Endoscopy;  Laterality: N/A;   FUNCTIONAL ENDOSCOPIC SINUS SURGERY     JOINT REPLACEMENT     LAMINECTOMY     LUMBAR SPINE SURGERY     SHOULDER ARTHROSCOPY WITH ROTATOR CUFF REPAIR AND OPEN BICEPS TENODESIS Right    SHOULDER ARTHROSCOPY WITH ROTATOR CUFF REPAIR AND OPEN BICEPS TENODESIS     SPINE SURGERY     TRIGGER POINT INJECTION     VASECTOMY     With Reversal   VEIN SURGERY Right 2013   arm   Family History  Problem Relation Age of Onset   Heart disease Father    Heart attack Father    Heart disease Mother    Heart attack Mother    Heart attack Brother        MI's x 2    Hypertension Other    Colon cancer Other    Hypertension Sister    Heart attack Paternal Grandfather    Hypertension Sister    Social History   Socioeconomic History   Marital status: Divorced    Spouse name: Not on file   Number of children: 3  Years of education: Not on file   Highest education level: Not on file  Occupational History   Not on file  Tobacco Use   Smoking status: Never   Smokeless tobacco: Never  Vaping Use   Vaping Use: Never used  Substance and Sexual Activity   Alcohol use: No    Alcohol/week: 0.0 standard drinks of alcohol   Drug use: No   Sexual activity: Yes  Other Topics Concern   Not on file  Social History Narrative   Not on file   Social Determinants of Health   Financial Resource Strain: Low Risk  (12/29/2020)   Overall Financial Resource Strain (CARDIA)    Difficulty of Paying Living Expenses: Not hard at all  Food Insecurity: No Food Insecurity (12/29/2020)   Hunger Vital Sign    Worried About Running Out of Food in the Last Year: Never true    Ran Out of Food in the Last Year: Never true  Transportation Needs: No Transportation Needs  (12/29/2020)   PRAPARE - Administrator, Civil Service (Medical): No    Lack of Transportation (Non-Medical): No  Physical Activity: Not on file  Stress: No Stress Concern Present (12/29/2020)   [employer] of Occupational Health - Occupational Stress Questionnaire    Feeling of Stress : Not at all  Social Connections: Unknown (12/29/2020)   Social Connection and Isolation Panel [NHANES]    Frequency of Communication with Friends and Family: More than three times a week    Frequency of Social Gatherings with Friends and Family: More than three times a week    Attends Religious Services: Not on Marketing executive or Organizations: Not on file    Attends Banker Meetings: Not on file    Marital Status: Not on file     Review of Systems  Constitutional:  Negative for appetite change and unexpected weight change.  HENT:  Negative for congestion, sinus pressure and sore throat.   Eyes:  Negative for pain and visual disturbance.  Respiratory:  Negative for cough, chest tightness and shortness of breath.   Cardiovascular:  Negative for chest pain, palpitations and leg swelling.  Gastrointestinal:  Negative for abdominal pain, diarrhea, nausea and vomiting.  Genitourinary:  Negative for difficulty urinating and dysuria.  Musculoskeletal:  Negative for joint swelling and myalgias.  Skin:  Negative for color change and rash.  Neurological:  Negative for dizziness and headaches.  Hematological:  Negative for adenopathy. Does not bruise/bleed easily.  Psychiatric/Behavioral:  Negative for agitation and dysphoric mood.        Objective:     BP 122/78   Pulse 84   Temp 97.9 F (36.6 C)   Resp 16   Ht 6\' 1"  (1.854 m)   Wt 201 lb (91.2 kg)   SpO2 98%   BMI 26.52 kg/m  Wt Readings from Last 3 Encounters:  07/06/22 201 lb (91.2 kg)  04/11/22 198 lb (89.8 kg)  03/26/22 203 lb (92.1 kg)    Physical Exam Constitutional:      General: He is  not in acute distress.    Appearance: Normal appearance. He is well-developed.  HENT:     Head: Normocephalic and atraumatic.     Right Ear: External ear normal.     Left Ear: External ear normal.  Eyes:     General: No scleral icterus.       Right eye: No discharge.  Left eye: No discharge.     Conjunctiva/sclera: Conjunctivae normal.  Neck:     Thyroid: No thyromegaly.  Cardiovascular:     Rate and Rhythm: Normal rate and regular rhythm.  Pulmonary:     Effort: No respiratory distress.     Breath sounds: Normal breath sounds. No wheezing.  Abdominal:     General: Bowel sounds are normal.     Palpations: Abdomen is soft.     Tenderness: There is no abdominal tenderness.  Genitourinary:    Comments: Testicular exam - increased fullness - left testicle.  Rectal exam - no palpable prostate nodule. Heme negative.  Musculoskeletal:        General: No swelling or tenderness.     Cervical back: Neck supple. No tenderness.  Lymphadenopathy:     Cervical: No cervical adenopathy.  Skin:    Findings: No erythema or rash.  Neurological:     Mental Status: He is alert and oriented to person, place, and time.  Psychiatric:        Mood and Affect: Mood normal.        Behavior: Behavior normal.      Outpatient Encounter Medications as of 07/06/2022  Medication Sig   Adalimumab 40 MG/0.4ML PNKT Inject 40 mg into the skin every 14 (fourteen) days.   amLODipine (NORVASC) 5 MG tablet Take 1 tablet (5 mg total) by mouth 2 (two) times daily.   atomoxetine (STRATTERA) 100 MG capsule Take 100 mg by mouth daily.   gabapentin (NEURONTIN) 300 MG capsule Take 1 or 2 capsules by mouth TID   modafinil (PROVIGIL) 200 MG tablet Take 400 mg by mouth in the morning.   ondansetron (ZOFRAN-ODT) 4 MG disintegrating tablet Take 1 tablet (4 mg total) by mouth every 6 (six) hours as needed for nausea or vomiting.   oxyCODONE (OXY IR/ROXICODONE) 5 MG immediate release tablet Take 5-10 mg by mouth at  bedtime as needed for moderate pain.    rosuvastatin (CRESTOR) 5 MG tablet Take 1 tablet (5 mg total) by mouth daily.   sildenafil (REVATIO) 20 MG tablet TAKE 4 - 5 TABLETS BY MOUTH DAILY AS NEEDED   sucralfate (CARAFATE) 1 g tablet Take 1 g by mouth daily.    tamsulosin (FLOMAX) 0.4 MG CAPS capsule Take 0.4 mg by mouth daily.   tiZANidine (ZANAFLEX) 4 MG tablet TAKE 1 - 2 TABLETS BY MOUTH THREE TIMES DAILY AS NEEDED   traZODone (DESYREL) 100 MG tablet Take 1 tablet (100 mg total) by mouth at bedtime. Do not take with oxycodone or muscle relaxer.   [DISCONTINUED] amLODipine (NORVASC) 5 MG tablet TAKE 1 TABLET BY MOUTH TWICE A DAY (Patient not taking: Reported on 04/07/2022)   [DISCONTINUED] oxyCODONE-acetaminophen (PERCOCET) 5-325 MG tablet Take 2 tablets by mouth every 6 (six) hours as needed for severe pain.   [DISCONTINUED] rosuvastatin (CRESTOR) 5 MG tablet TAKE 1 TABLET BY MOUTH EVERY DAY   [DISCONTINUED] traZODone (DESYREL) 100 MG tablet TAKE 1 TABLET (100 MG TOTAL) BY MOUTH AT BEDTIME. DO NOT TAKE WITH OXYCODONE OR MUSCLE RELAXER.   No facility-administered encounter medications on file as of 07/06/2022.     Lab Results  Component Value Date   WBC 5.7 07/06/2022   HGB 14.1 07/06/2022   HCT 41.8 07/06/2022   PLT 329.0 07/06/2022   GLUCOSE 102 (H) 07/06/2022   CHOL 131 07/06/2022   TRIG 99.0 07/06/2022   HDL 53.10 07/06/2022   LDLCALC 58 07/06/2022   ALT 31 07/06/2022  AST 27 07/06/2022   NA 139 07/06/2022   K 4.1 07/06/2022   CL 103 07/06/2022   CREATININE 0.92 07/06/2022   BUN 19 07/06/2022   CO2 24 07/06/2022   TSH 0.99 03/07/2022   PSA 0.53 11/02/2021   INR 1.0 09/24/2019   HGBA1C 5.6 09/23/2019    No results found.     Assessment & Plan:  Routine general medical examination at a health care facility  Primary hypertension Assessment & Plan: Continue amlodipine. Follow pressures. Follow metabolic panel.    Orders: -     Basic metabolic panel -      Testosterone  Hypercholesteremia Assessment & Plan: Low cholesterol diet and exercise.  Continue crestor.  Check lipid panel and liver function tests.    Orders: -     CBC with Differential/Platelet -     Hepatic function panel -     Lipid panel  Health care maintenance Assessment & Plan: Physical today 07/06/22.  Colonoscopy due 03/05/26.  PSA 11/02/21 - .53.    History of pancreatitis Assessment & Plan: No abdominal pain currently.  Eating better.  W/up during recent hospitalization as outlined.    Orders: -     Lipase  Abnormal liver function tests Assessment & Plan: Found to have elevated alkaline phos.  Evaluated by GI previously.  Currently being followed - hepatology clinic. Recent fibroscan - no fibrosis.  Recommended repeating his transient elastography score every 3 years so long as alk phos remains abnormal. See above.  Scheduled for f/u with GI.     Anemia, unspecified type Assessment & Plan: Recent admission for bloody/black stool.  W/up as outlined.  Recheck cbc today.  Has f/u with GI - question of need for f/u luminal evaluation.     Aortic dilatation [facility]) Assessment & Plan: Aortic root ectasia - 4cm (CT chest - [facility] 03/31/2022).  Followed by cardiology.    Chronic low back pain, unspecified back pain laterality, unspecified whether sciatica present Assessment & Plan: Followed by pain clinic/[facility].  Stable. Seeing Dr Lynn Ito - f/u low back pain and neck pain.  Pain fairly controlled with medications, exercise and trigger point injections.  Received trigger point injections and recommended to continue gabapentin and tizanidine.   Depression, major, single episode, mild [facility]) Assessment & Plan: Followed by Dr Evelene Croon.  Appears to be doing better.  Follow.    Rheumatoid arthritis, involving unspecified site, unspecified whether rheumatoid factor present [facility]) Assessment & Plan: Being followed by rheumatology.  On humira.  Stable.    Syncope, unspecified  syncope type Assessment & Plan: Recently admitted for syncope.  Note reviewed - admitted 03/31/22 - LUQ abdominal pain and bloody/black stools.  Extensive w/up - CT brain and MRI - no acute abnormality.  CT cervical, thoracic and lumbar spine - no acute findings.  Cardiac stress testing - normal.  Cardiac telemetry - no arrhythmia.  CT abdomen - no acute abnormality.  Mild intra and extrahepatic ductal dilatation.  Felt likely to be related to cholecystectomy.  CT scan chest - no PE.  Trazodone and amlodipine - stopped. He reports since being out of the hospital, he feels better.  He is eating better.  Had syncopal episodes prior to this admission.  Seeing cardiology.  Refer to Dr Darrold Junker last note.  He has restarted amlodipine.  Blood pressure ok now.  No low pressures.  Cardiology felt no further w/up warranted.  No recurring episodes. Follow.     Testicle lump Assessment & Plan: Testicular fullness  noted on exam.  Refer to urology for further evaluation.   Orders: -     Ambulatory referral to Urology  Other orders -     amLODIPine Besylate; Take 1 tablet (5 mg total) by mouth 2 (two) times daily.  Dispense: 180 tablet; Refill: 1 -     Rosuvastatin Calcium; Take 1 tablet (5 mg total) by mouth daily.  Dispense: 90 tablet; Refill: 3 -     traZODone HCl; Take 1 tablet (100 mg total) by mouth at bedtime. Do not take with oxycodone or muscle relaxer.  Dispense: 90 tablet; Refill: 1     Dale [place], MD

## 2022-07-06 NOTE — Assessment & Plan Note (Signed)
Physical today 07/06/22.  Colonoscopy due 03/05/26.  PSA 11/02/21 - .53.

## 2022-07-10 ENCOUNTER — Encounter: Payer: Self-pay | Admitting: Internal Medicine

## 2022-07-10 DIAGNOSIS — N5089 Other specified disorders of the male genital organs: Secondary | ICD-10-CM | POA: Insufficient documentation

## 2022-07-10 NOTE — Assessment & Plan Note (Signed)
Recent admission for bloody/black stool.  W/up as outlined.  Recheck cbc today.  Has f/u with GI - question of need for f/u luminal evaluation.

## 2022-07-10 NOTE — Assessment & Plan Note (Signed)
No abdominal pain currently.  Eating better.  W/up during recent hospitalization as outlined.   

## 2022-07-10 NOTE — Assessment & Plan Note (Signed)
Recently admitted for syncope.  Note reviewed - admitted 03/31/22 - LUQ abdominal pain and bloody/black stools.  Extensive w/up - CT brain and MRI - no acute abnormality.  CT cervical, thoracic and lumbar spine - no acute findings.  Cardiac stress testing - normal.  Cardiac telemetry - no arrhythmia.  CT abdomen - no acute abnormality.  Mild intra and extrahepatic ductal dilatation.  Felt likely to be related to cholecystectomy.  CT scan chest - no PE.  Trazodone and amlodipine - stopped. He reports since being out of the hospital, he feels better.  He is eating better.  Had syncopal episodes prior to this admission.  Seeing cardiology.  Refer to Dr Darrold Junker last note.  He has restarted amlodipine.  Blood pressure ok now.  No low pressures.  Cardiology felt no further w/up warranted.  No recurring episodes. Follow.

## 2022-07-10 NOTE — Assessment & Plan Note (Signed)
Being followed by rheumatology.  On humira.  Stable.  

## 2022-07-10 NOTE — Assessment & Plan Note (Signed)
Low cholesterol diet and exercise.  Continue crestor.  Check lipid panel and liver function tests.   

## 2022-07-10 NOTE — Assessment & Plan Note (Signed)
Continue amlodipine.  Follow pressures.  Follow metabolic panel.   

## 2022-07-10 NOTE — Assessment & Plan Note (Signed)
Found to have elevated alkaline phos.  Evaluated by GI previously.  Currently being followed - hepatology clinic. Recent fibroscan - no fibrosis.  Recommended repeating his transient elastography score every 3 years so long as alk phos remains abnormal. See above.  Scheduled for f/u with GI.

## 2022-07-10 NOTE — Assessment & Plan Note (Signed)
Testicular fullness noted on exam.  Refer to urology for further evaluation.

## 2022-07-10 NOTE — Assessment & Plan Note (Signed)
Aortic root ectasia - 4cm (CT chest - [facility] 03/31/2022).  Followed by cardiology.  

## 2022-07-10 NOTE — Assessment & Plan Note (Addendum)
Followed by pain clinic/[facility].  Stable. Seeing Dr Lynn Ito - f/u low back pain and neck pain.  Pain fairly controlled with medications, exercise and trigger point injections.  Received trigger point injections and recommended to continue gabapentin and tizanidine.

## 2022-07-10 NOTE — Assessment & Plan Note (Signed)
Followed by Dr Evelene Croon.  Appears to be doing better.  Follow.

## 2022-07-15 DIAGNOSIS — M059 Rheumatoid arthritis with rheumatoid factor, unspecified: Secondary | ICD-10-CM | POA: Diagnosis not present

## 2022-07-25 ENCOUNTER — Encounter: Payer: Self-pay | Admitting: Internal Medicine

## 2022-07-25 ENCOUNTER — Telehealth: Payer: Self-pay

## 2022-07-25 DIAGNOSIS — S91331A Puncture wound without foreign body, right foot, initial encounter: Secondary | ICD-10-CM | POA: Diagnosis not present

## 2022-07-25 DIAGNOSIS — Z9289 Personal history of other medical treatment: Secondary | ICD-10-CM | POA: Diagnosis not present

## 2022-07-25 NOTE — Telephone Encounter (Signed)
See phone note

## 2022-07-25 NOTE — Telephone Encounter (Signed)
Called patient. He is up to date on his Tdap. He has been scheduled with Dr Birdie Sons tomorrow to have his foot looked at. Pt did agree to be seen ASAP if any acute change (swelling, pain, fever, etc)

## 2022-07-25 NOTE — Telephone Encounter (Signed)
Patient states he just stepped on a rusted nail about one hour ago and it's already swelling.  Patient states he would like to know if his tetanus shot is current.  I spoke with Rita Ohara, LPN, and she states his tetanus shot is current and he needs to go to Urgent Care if it's already swelling.  I relayed message to patient.  Patient was wondering why he needs to go to Urgent Care.  I asked if he would be willing to speak with our triage nurse.  He agreed.  I transferred call to Access Nurse.

## 2022-07-26 ENCOUNTER — Ambulatory Visit: Payer: Medicare HMO | Admitting: Family Medicine

## 2022-08-10 ENCOUNTER — Encounter: Payer: Self-pay | Admitting: Urology

## 2022-08-10 ENCOUNTER — Ambulatory Visit: Payer: Medicare HMO | Admitting: Urology

## 2022-08-10 VITALS — BP 122/79 | HR 99 | Ht 73.0 in | Wt 198.0 lb

## 2022-08-10 DIAGNOSIS — N5089 Other specified disorders of the male genital organs: Secondary | ICD-10-CM | POA: Diagnosis not present

## 2022-08-10 LAB — URINALYSIS, COMPLETE
Bilirubin, UA: NEGATIVE
Glucose, UA: NEGATIVE
Ketones, UA: NEGATIVE
Leukocytes,UA: NEGATIVE
Nitrite, UA: NEGATIVE
RBC, UA: NEGATIVE
Specific Gravity, UA: 1.025 (ref 1.005–1.030)
Urobilinogen, Ur: 0.2 mg/dL (ref 0.2–1.0)
pH, UA: 6 (ref 5.0–7.5)

## 2022-08-10 LAB — MICROSCOPIC EXAMINATION

## 2022-08-10 NOTE — Progress Notes (Signed)
I, Duke Salvia, acting as a Neurosurgeon for Riki Altes, MD.,have documented all relevant documentation on the behalf of Riki Altes, MD, as directed by  Riki Altes, MD while in the presence of Riki Altes, MD.   08/10/2022 11:18 AM   [name] 11-10-1960 [id number]  Referring provider: Dale [place]ett [place], MD [address] [place],  [place] [address]  Chief Complaint  Patient presents with   New Patient (Initial Visit)    HPI: [name] is a 62 y.o. male referred for evaluation of abnormal scrotal exam.  On recent annual exam noted to have fullness of the left testis. Has occasional mild left hemiscrotal discomfort. Prior vasectomy and vasectomy reversal.   PMH: Past Medical History:  Diagnosis Date   Allergy    Anoxic brain injury (HCC)    Arthritis    Awareness under anesthesia    need a lot to put me under   Chronic back pain    s/p 5 back surgeries   Coronary artery disease    Cardiac catheterization in 2007 at [facility] showed 30% stenosis in proximal LAD. No other disease. Ejection fraction was 65%   Depression    H/O acute pancreatitis    Hypercholesterolemia    Hypertension    Mild cognitive impairment with memory loss    Pancreatitis     Surgical History: Past Surgical History:  Procedure Laterality Date   abdomial surgery     BACK SURGERY     multiple back surgeries (5)   CARDIAC CATHETERIZATION     [facility]    CHOLECYSTECTOMY  2013   COLONOSCOPY N/A 03/05/2021   Procedure: COLONOSCOPY;  Surgeon: Jaynie Collins, DO;  Location: [facility] ENDOSCOPY;  Service: Gastroenterology;  Laterality: N/A;  IDDM   COLONOSCOPY WITH PROPOFOL N/A 06/30/2015   Procedure: COLONOSCOPY WITH PROPOFOL;  Surgeon: Christena Deem, MD;  Location: [facility] ENDOSCOPY;  Service: Endoscopy;  Laterality: N/A;   ESOPHAGOGASTRODUODENOSCOPY (EGD) WITH PROPOFOL N/A 03/30/2016   Procedure: ESOPHAGOGASTRODUODENOSCOPY (EGD) WITH PROPOFOL;  Surgeon: Scot Jun, MD;  Location: [facility] ENDOSCOPY;  Service: Endoscopy;  Laterality: N/A;   FUNCTIONAL ENDOSCOPIC SINUS SURGERY     JOINT REPLACEMENT     LAMINECTOMY     LUMBAR SPINE SURGERY     SHOULDER ARTHROSCOPY WITH ROTATOR CUFF REPAIR AND OPEN BICEPS TENODESIS Right    SHOULDER ARTHROSCOPY WITH ROTATOR CUFF REPAIR AND OPEN BICEPS TENODESIS     SPINE SURGERY     TRIGGER POINT INJECTION     VASECTOMY     With Reversal   VEIN SURGERY Right 2013   arm    Home Medications:  Allergies as of 08/10/2022       Reactions   Corticosteroids Other (See Comments)   Pancreatitis   Penicillins Anaphylaxis   Has patient had a PCN reaction causing immediate rash, facial/tongue/throat swelling, SOB or lightheadedness with hypotension: no Has patient had a PCN reaction causing severe rash involving mucus membranes or skin necrosis: yes Has patient had a PCN reaction that required hospitalization: yes Has patient had a PCN reaction occurring within the last 10 years: no If all of the above answers are "NO", then may proceed with Cephalosporin use.   Sulfa Antibiotics Other (See Comments)   Unknown reaction   Sulfasalazine    Plaquenil [hydroxychloroquine] Rash   Protonix [pantoprazole] Rash        Medication List        Accurate as of August 10, 2022 11:18 AM. If you have any questions, ask your nurse or doctor.          adalimumab 40 MG/0.4ML pen Commonly known as: HUMIRA Inject 40 mg into the skin every 14 (fourteen) days.   amLODipine 5 MG tablet Commonly known as: NORVASC Take 1 tablet (5 mg total) by mouth 2 (two) times daily.   atomoxetine 100 MG capsule Commonly known as: STRATTERA Take 100 mg by mouth daily.   desvenlafaxine 100 MG 24 hr tablet Commonly known as: PRISTIQ Take 100 mg by mouth daily.   escitalopram 10 MG tablet Commonly known as: LEXAPRO Take 10 mg by mouth daily.   gabapentin 300 MG capsule Commonly known as: NEURONTIN Take 1 or 2 capsules by mouth  TID   hydrOXYzine 10 MG tablet Commonly known as: ATARAX TAKE ONE TAB AS NEEDED FOR ITCHING AT BEDTIME   ketoconazole 2 % shampoo Commonly known as: NIZORAL Apply topically.   modafinil 200 MG tablet Commonly known as: PROVIGIL Take 400 mg by mouth in the morning.   mupirocin ointment 2 % Commonly known as: BACTROBAN APPLY TO AFFECTED AREA 3 TIMES A DAY FOR 7 DAYS   ondansetron 4 MG disintegrating tablet Commonly known as: ZOFRAN-ODT Take 1 tablet (4 mg total) by mouth every 6 (six) hours as needed for nausea or vomiting.   oxyCODONE 5 MG immediate release tablet Commonly known as: Oxy IR/ROXICODONE Take 5-10 mg by mouth at bedtime as needed for moderate pain.   rosuvastatin 5 MG tablet Commonly known as: CRESTOR Take 1 tablet (5 mg total) by mouth daily.   sildenafil 20 MG tablet Commonly known as: REVATIO TAKE 4 - 5 TABLETS BY MOUTH DAILY AS NEEDED   sucralfate 1 g tablet Commonly known as: CARAFATE Take 1 g by mouth daily.   tamsulosin 0.4 MG Caps capsule Commonly known as: FLOMAX Take 0.4 mg by mouth daily.   tiZANidine 4 MG tablet Commonly known as: ZANAFLEX TAKE 1 - 2 TABLETS BY MOUTH THREE TIMES DAILY AS NEEDED   traZODone 100 MG tablet Commonly known as: DESYREL Take 1 tablet (100 mg total) by mouth at bedtime. Do not take with oxycodone or muscle relaxer.        Allergies:  Allergies  Allergen Reactions   Corticosteroids Other (See Comments)    Pancreatitis   Penicillins Anaphylaxis    Has patient had a PCN reaction causing immediate rash, facial/tongue/throat swelling, SOB or lightheadedness with hypotension: no Has patient had a PCN reaction causing severe rash involving mucus membranes or skin necrosis: yes Has patient had a PCN reaction that required hospitalization: yes Has patient had a PCN reaction occurring within the last 10 years: no If all of the above answers are "NO", then may proceed with Cephalosporin use.    Sulfa Antibiotics  Other (See Comments)    Unknown reaction   Sulfasalazine    Plaquenil [Hydroxychloroquine] Rash   Protonix [Pantoprazole] Rash    Family History: Family History  Problem Relation Age of Onset   Heart disease Father    Heart attack Father    Heart disease Mother    Heart attack Mother    Heart attack Brother        MI's x 2    Hypertension Other    Colon cancer Other    Hypertension Sister    Heart attack Paternal Grandfather    Hypertension Sister     Social History:  reports that he has never smoked. He  has never used smokeless tobacco. He reports that he does not drink alcohol and does not use drugs.   Physical Exam: BP 122/79   Pulse 99   Ht 6\' 1"  (1.854 m)   Wt 198 lb (89.8 kg)   BMI 26.12 kg/m   Constitutional:  Alert and oriented, No acute distress. HEENT: [place] AT Respiratory: Normal respiratory effort, no increased work of breathing. GI: Abdomen is soft, nontender, nondistended, no abdominal masses GU: Phallus without lesions. Testes descended bilaterally without masses or tenderness. The left testis is normal in size and consistency. Medial to the hemiscrotum in its superior aspect is extratesticular tissue, which is not indurated and most likely sequela of prior vasectomy/vasectomy reversal. Psychiatric: Normal mood and affect.   Assessment & Plan:   Normal testicular findings on exam, most likely sequela of previous vasectomy/vasectomy reveral. This finding is not suspicious for significant abnormality. Recommend monthly testicular exam and to return as needed for any noted changes.  I have reviewed the above documentation for accuracy and completeness, and I agree with the above.   Riki Altes, MD  [facility] [address] [place], [place] [address] [phone]

## 2022-09-05 DIAGNOSIS — M5030 Other cervical disc degeneration, unspecified cervical region: Secondary | ICD-10-CM | POA: Diagnosis not present

## 2022-09-05 DIAGNOSIS — M5136 Other intervertebral disc degeneration, lumbar region: Secondary | ICD-10-CM | POA: Diagnosis not present

## 2022-09-05 DIAGNOSIS — M19012 Primary osteoarthritis, left shoulder: Secondary | ICD-10-CM | POA: Diagnosis not present

## 2022-09-05 DIAGNOSIS — Z8719 Personal history of other diseases of the digestive system: Secondary | ICD-10-CM | POA: Diagnosis not present

## 2022-09-05 DIAGNOSIS — M170 Bilateral primary osteoarthritis of knee: Secondary | ICD-10-CM | POA: Diagnosis not present

## 2022-09-05 DIAGNOSIS — M7918 Myalgia, other site: Secondary | ICD-10-CM | POA: Diagnosis not present

## 2022-09-05 DIAGNOSIS — M069 Rheumatoid arthritis, unspecified: Secondary | ICD-10-CM | POA: Diagnosis not present

## 2022-09-05 DIAGNOSIS — M19011 Primary osteoarthritis, right shoulder: Secondary | ICD-10-CM | POA: Diagnosis not present

## 2022-09-05 DIAGNOSIS — M19022 Primary osteoarthritis, left elbow: Secondary | ICD-10-CM | POA: Diagnosis not present

## 2022-09-05 DIAGNOSIS — Z79899 Other long term (current) drug therapy: Secondary | ICD-10-CM | POA: Diagnosis not present

## 2022-09-05 DIAGNOSIS — G894 Chronic pain syndrome: Secondary | ICD-10-CM | POA: Diagnosis not present

## 2022-09-16 ENCOUNTER — Emergency Department
Admission: EM | Admit: 2022-09-16 | Discharge: 2022-09-17 | Disposition: A | Discharge status: Home / Self Care | Payer: Medicare HMO | Attending: Emergency Medicine | Admitting: Emergency Medicine

## 2022-09-16 ENCOUNTER — Emergency Department: Payer: Medicare HMO

## 2022-09-16 ENCOUNTER — Other Ambulatory Visit: Payer: Self-pay

## 2022-09-16 DIAGNOSIS — G894 Chronic pain syndrome: Secondary | ICD-10-CM

## 2022-09-16 DIAGNOSIS — S0990XA Unspecified injury of head, initial encounter: Secondary | ICD-10-CM | POA: Insufficient documentation

## 2022-09-16 DIAGNOSIS — W11XXXA Fall on and from ladder, initial encounter: Secondary | ICD-10-CM | POA: Insufficient documentation

## 2022-09-16 DIAGNOSIS — M48061 Spinal stenosis, lumbar region without neurogenic claudication: Secondary | ICD-10-CM | POA: Diagnosis not present

## 2022-09-16 DIAGNOSIS — M545 Low back pain, unspecified: Secondary | ICD-10-CM | POA: Diagnosis not present

## 2022-09-16 DIAGNOSIS — S199XXA Unspecified injury of neck, initial encounter: Secondary | ICD-10-CM | POA: Diagnosis not present

## 2022-09-16 DIAGNOSIS — W19XXXA Unspecified fall, initial encounter: Secondary | ICD-10-CM

## 2022-09-16 DIAGNOSIS — M47816 Spondylosis without myelopathy or radiculopathy, lumbar region: Secondary | ICD-10-CM | POA: Diagnosis not present

## 2022-09-16 DIAGNOSIS — M5136 Other intervertebral disc degeneration, lumbar region: Secondary | ICD-10-CM | POA: Diagnosis not present

## 2022-09-16 MED ORDER — ACETAMINOPHEN 500 MG PO TABS
1000.0000 mg | ORAL_TABLET | Freq: Once | ORAL | Status: AC
  Administered 2022-09-16: 1000 mg via ORAL
  Filled 2022-09-16: qty 2

## 2022-09-16 MED ORDER — OXYCODONE HCL 5 MG PO TABS
5.0000 mg | ORAL_TABLET | Freq: Once | ORAL | Status: AC
  Administered 2022-09-16: 5 mg via ORAL
  Filled 2022-09-16: qty 1

## 2022-09-16 NOTE — ED Triage Notes (Signed)
Pt reports that he fell approx 4 ft from a ladder that folded up on him striking the left side of his head. Since then he has been having nausea and feeling "dazed". Pt also reports neck pain and lower back pain. Pt was placed in c-collar in triage.

## 2022-09-16 NOTE — ED Provider Notes (Signed)
[facility] Provider Note    Event Date/Time   First MD Initiated Contact with Patient 09/16/22 2307     (approximate)   History   Fall and Head Injury   HPI  [name] is a 62 y.o. male who presents to the ED for evaluation of Fall and Head Injury   I reviewed rheumatology clinic visit from 2 months ago.  History of chronic pain syndrome, multiple spinal surgeries, RA she is admitted  Patient presents to the ED alongside his daughter for evaluation of fall and accidental head injury.  Patient reports being a couple feet up on a ladder, it folded underneath him and caused him to strike the left side of his head.  No syncope.  Reports concern for his back hardware.   Physical Exam   Triage Vital Signs: ED Triage Vitals  Encounter Vitals Group     BP 09/16/22 1719 (!) 145/74     Systolic BP Percentile --      Diastolic BP Percentile --      Pulse Rate 09/16/22 1719 (!) 103     Resp 09/16/22 1719 18     Temp 09/16/22 1719 97.8 F (36.6 C)     Temp Source 09/16/22 1719 Oral     SpO2 09/16/22 1719 100 %     Weight 09/16/22 1720 196 lb 3.4 oz (89 kg)     Height 09/16/22 1720 6\' 1"  (1.854 m)     Head Circumference --      Peak Flow --      Pain Score 09/16/22 1720 10     Pain Loc --      Pain Education --      Exclude from Growth Chart --     Most recent vital signs: Vitals:   09/16/22 2317 09/16/22 2319  BP: 125/80   Pulse: 68   Resp:  20  Temp:    SpO2: 99%     General: Awake, no distress.  CV:  Good peripheral perfusion.  Resp:  Normal effort.  Abd:  No distention.  MSK:  No deformity noted.  Neuro:  No focal deficits appreciated. Other:  C-collar in place, I removed at the bedside.  No midline spinal tenderness of the neck.  No signs of trauma to the extremities.   ED Results / Procedures / Treatments   Labs (all labs ordered are listed, but only abnormal results are displayed) Labs Reviewed - No data to  display  EKG   RADIOLOGY CT head interpreted by me without evidence of acute intracranial pathology CT cervical spine interpreted by me without evidence of fracture or dislocation CT lumbar spine interpreted by me without evidence of fracture or dislocation.  Official radiology report(s): CT Lumbar Spine Wo Contrast  Result Date: 09/16/2022 CLINICAL DATA:  Fall.  Lower back pain. EXAM: CT LUMBAR SPINE WITHOUT CONTRAST TECHNIQUE: Multidetector CT imaging of the lumbar spine was performed without intravenous contrast administration. Multiplanar CT image reconstructions were also generated. RADIATION DOSE REDUCTION: This exam was performed according to the departmental dose-optimization program which includes automated exposure control, adjustment of the mA and/or kV according to patient size and/or use of iterative reconstruction technique. COMPARISON:  Lumbar spine radiographs 11/07/2016. FINDINGS: Segmentation: Conventional numbering is assumed with 5 non-rib-bearing, lumbar type vertebral bodies. Alignment: Normal. Vertebrae: Postoperative changes from prior L3-L5 interbody and L2-L5 posterior spinal fusion with L2-L4 laminectomy and removal of pedicle screws at L5. Solid bony fusion across the L3-4 and  L4-5 levels. Nonunion at L2-3. Hardware is intact. No acute lumbar spine fracture. Paraspinal and other soft tissues: Unremarkable. Disc levels: Adjacent segment degeneration at L1-2 with disc-osteophyte complex and facet arthropathy resulting in mild spinal canal stenosis. IMPRESSION: 1. No acute lumbar spine fracture. 2. Postoperative changes from prior L3-L5 interbody and L2-L5 posterior spinal fusion with L2-L4 laminectomy and removal of pedicle screws at L5. Solid bony fusion across the L3-4 and L4-5 levels. Nonunion at L2-3. Hardware is intact. 3. Adjacent segment degeneration at L1-2 with disc-osteophyte complex and facet arthropathy resulting in mild spinal canal stenosis. Electronically Signed    By: Orvan Falconer M.D.   On: 09/16/2022 18:46   CT Head Wo Contrast  Result Date: 09/16/2022 CLINICAL DATA:  Head trauma, moderate-severe; Neck trauma, dangerous injury mechanism (Age 71-64y). Fall from ladder with head strike. EXAM: CT HEAD WITHOUT CONTRAST CT CERVICAL SPINE WITHOUT CONTRAST TECHNIQUE: Multidetector CT imaging of the head and cervical spine was performed following the standard protocol without intravenous contrast. Multiplanar CT image reconstructions of the cervical spine were also generated. RADIATION DOSE REDUCTION: This exam was performed according to the departmental dose-optimization program which includes automated exposure control, adjustment of the mA and/or kV according to patient size and/or use of iterative reconstruction technique. COMPARISON:  Head CT 09/23/2019. FINDINGS: CT HEAD FINDINGS Brain: No acute intracranial hemorrhage. Gray-white differentiation is preserved. No hydrocephalus or extra-axial collection. No mass effect or midline shift. Vascular: No hyperdense vessel or unexpected calcification. Skull: No calvarial fracture or suspicious bone lesion. Skull base is unremarkable. Sinuses/Orbits: No acute finding. Other: None. CT CERVICAL SPINE FINDINGS Alignment: Normal. Skull base and vertebrae: No acute fracture. Normal craniocervical junction. No suspicious bone lesions. Soft tissues and spinal canal: No prevertebral fluid or swelling. No visible canal hematoma. Disc levels: Mild cervical spondylosis without high-grade spinal canal stenosis. Upper chest: No acute findings. Other: None. IMPRESSION: 1. No acute intracranial abnormality. 2. No acute cervical spine fracture or traumatic listhesis. Electronically Signed   By: Orvan Falconer M.D.   On: 09/16/2022 18:41   CT Cervical Spine Wo Contrast  Result Date: 09/16/2022 CLINICAL DATA:  Head trauma, moderate-severe; Neck trauma, dangerous injury mechanism (Age 72-64y). Fall from ladder with head strike. EXAM: CT  HEAD WITHOUT CONTRAST CT CERVICAL SPINE WITHOUT CONTRAST TECHNIQUE: Multidetector CT imaging of the head and cervical spine was performed following the standard protocol without intravenous contrast. Multiplanar CT image reconstructions of the cervical spine were also generated. RADIATION DOSE REDUCTION: This exam was performed according to the departmental dose-optimization program which includes automated exposure control, adjustment of the mA and/or kV according to patient size and/or use of iterative reconstruction technique. COMPARISON:  Head CT 09/23/2019. FINDINGS: CT HEAD FINDINGS Brain: No acute intracranial hemorrhage. Gray-white differentiation is preserved. No hydrocephalus or extra-axial collection. No mass effect or midline shift. Vascular: No hyperdense vessel or unexpected calcification. Skull: No calvarial fracture or suspicious bone lesion. Skull base is unremarkable. Sinuses/Orbits: No acute finding. Other: None. CT CERVICAL SPINE FINDINGS Alignment: Normal. Skull base and vertebrae: No acute fracture. Normal craniocervical junction. No suspicious bone lesions. Soft tissues and spinal canal: No prevertebral fluid or swelling. No visible canal hematoma. Disc levels: Mild cervical spondylosis without high-grade spinal canal stenosis. Upper chest: No acute findings. Other: None. IMPRESSION: 1. No acute intracranial abnormality. 2. No acute cervical spine fracture or traumatic listhesis. Electronically Signed   By: Orvan Falconer M.D.   On: 09/16/2022 18:41    PROCEDURES and INTERVENTIONS:  Procedures  Medications  acetaminophen (TYLENOL) tablet 1,000 mg (1,000 mg Oral Given 09/16/22 2355)  oxyCODONE (Oxy IR/ROXICODONE) immediate release tablet 5 mg (5 mg Oral Given 09/16/22 2355)     IMPRESSION / MDM / ASSESSMENT AND PLAN / ED COURSE  I reviewed the triage vital signs and the nursing notes.  Differential diagnosis includes, but is not limited to, syncope, fracture, dislocation,  stroke  {Patient presents with symptoms of an acute illness or injury that is potentially life-threatening.   Patient presents after fall without evidence of acute trauma suitable for outpatient management.  Reassuring imaging without signs of fracture, dislocation or hardware malfunction.  Pain is reasonably controlled and he is suitable for outpatient management      FINAL CLINICAL IMPRESSION(S) / ED DIAGNOSES   Final diagnoses:  Fall, initial encounter  Chronic pain syndrome  Injury of head, initial encounter     Rx / DC Orders   ED Discharge Orders     None        Note:  This document was prepared using Dragon voice recognition software and may include unintentional dictation errors.   Delton Prairie, MD 09/17/22 [phone]

## 2022-09-16 NOTE — ED Notes (Signed)
While in triage pt states "you'll have to push the dilaudid to help me".

## 2022-09-20 DIAGNOSIS — L659 Nonscarring hair loss, unspecified: Secondary | ICD-10-CM | POA: Diagnosis not present

## 2022-09-20 DIAGNOSIS — Z79899 Other long term (current) drug therapy: Secondary | ICD-10-CM | POA: Diagnosis not present

## 2022-09-20 DIAGNOSIS — L649 Androgenic alopecia, unspecified: Secondary | ICD-10-CM | POA: Diagnosis not present

## 2022-09-20 DIAGNOSIS — L280 Lichen simplex chronicus: Secondary | ICD-10-CM | POA: Diagnosis not present

## 2022-09-23 ENCOUNTER — Telehealth: Payer: Self-pay

## 2022-09-23 NOTE — Telephone Encounter (Signed)
Transition Care Management Unsuccessful Follow-up Telephone Call  Date of discharge and from where:  09/17/2022 [facility]  Attempts:  1st Attempt  Reason for unsuccessful TCM follow-up call:  Left voice message  Jadaya Sommerfield [facility]  [facility] Population Health Community Resource Care Guide   [email or web address]  ?? [phone]   Website: [email or web address]  [email or web address]

## 2022-09-26 ENCOUNTER — Telehealth: Payer: Self-pay

## 2022-09-26 NOTE — Telephone Encounter (Signed)
Transition Care Management Unsuccessful Follow-up Telephone Call  Date of discharge and from where:  09/17/2022 [facility]  Attempts:  2nd Attempt  Reason for unsuccessful TCM follow-up call:  Left voice message  Maleeah Crossman [facility]  [facility] Population Health Community Resource Care Guide   [email or web address]  ?? [phone]   Website: [email or web address]  [email or web address]

## 2022-09-27 DIAGNOSIS — R748 Abnormal levels of other serum enzymes: Secondary | ICD-10-CM | POA: Diagnosis not present

## 2022-09-27 DIAGNOSIS — K760 Fatty (change of) liver, not elsewhere classified: Secondary | ICD-10-CM | POA: Diagnosis not present

## 2022-10-05 DIAGNOSIS — K861 Other chronic pancreatitis: Secondary | ICD-10-CM | POA: Diagnosis not present

## 2022-10-05 DIAGNOSIS — M5136 Other intervertebral disc degeneration, lumbar region: Secondary | ICD-10-CM | POA: Diagnosis not present

## 2022-10-05 DIAGNOSIS — M170 Bilateral primary osteoarthritis of knee: Secondary | ICD-10-CM | POA: Diagnosis not present

## 2022-10-05 DIAGNOSIS — M069 Rheumatoid arthritis, unspecified: Secondary | ICD-10-CM | POA: Diagnosis not present

## 2022-10-05 DIAGNOSIS — J90 Pleural effusion, not elsewhere classified: Secondary | ICD-10-CM | POA: Diagnosis not present

## 2022-10-05 DIAGNOSIS — J811 Chronic pulmonary edema: Secondary | ICD-10-CM | POA: Diagnosis not present

## 2022-10-05 DIAGNOSIS — G894 Chronic pain syndrome: Secondary | ICD-10-CM | POA: Diagnosis not present

## 2022-10-05 DIAGNOSIS — G8929 Other chronic pain: Secondary | ICD-10-CM | POA: Diagnosis not present

## 2022-10-05 DIAGNOSIS — M19011 Primary osteoarthritis, right shoulder: Secondary | ICD-10-CM | POA: Diagnosis not present

## 2022-10-05 DIAGNOSIS — M19022 Primary osteoarthritis, left elbow: Secondary | ICD-10-CM | POA: Diagnosis not present

## 2022-10-05 DIAGNOSIS — M47816 Spondylosis without myelopathy or radiculopathy, lumbar region: Secondary | ICD-10-CM | POA: Diagnosis not present

## 2022-10-05 DIAGNOSIS — M7918 Myalgia, other site: Secondary | ICD-10-CM | POA: Diagnosis not present

## 2022-10-05 DIAGNOSIS — M5030 Other cervical disc degeneration, unspecified cervical region: Secondary | ICD-10-CM | POA: Diagnosis not present

## 2022-10-05 DIAGNOSIS — M19012 Primary osteoarthritis, left shoulder: Secondary | ICD-10-CM | POA: Diagnosis not present

## 2022-10-10 DIAGNOSIS — H524 Presbyopia: Secondary | ICD-10-CM | POA: Diagnosis not present

## 2022-10-24 ENCOUNTER — Emergency Department
Admission: EM | Admit: 2022-10-24 | Discharge: 2022-10-24 | Discharge status: Against Medical Advice | Payer: Medicare HMO | Attending: Emergency Medicine | Admitting: Emergency Medicine

## 2022-10-24 ENCOUNTER — Other Ambulatory Visit: Payer: Self-pay

## 2022-10-24 DIAGNOSIS — R110 Nausea: Secondary | ICD-10-CM | POA: Diagnosis not present

## 2022-10-24 DIAGNOSIS — R519 Headache, unspecified: Secondary | ICD-10-CM | POA: Diagnosis not present

## 2022-10-24 DIAGNOSIS — R197 Diarrhea, unspecified: Secondary | ICD-10-CM | POA: Diagnosis not present

## 2022-10-24 DIAGNOSIS — Z5321 Procedure and treatment not carried out due to patient leaving prior to being seen by health care provider: Secondary | ICD-10-CM | POA: Insufficient documentation

## 2022-10-24 DIAGNOSIS — R109 Unspecified abdominal pain: Secondary | ICD-10-CM | POA: Insufficient documentation

## 2022-10-24 LAB — COMPREHENSIVE METABOLIC PANEL
ALT: 38 U/L (ref 0–44)
AST: 33 U/L (ref 15–41)
Albumin: 4.2 g/dL (ref 3.5–5.0)
Alkaline Phosphatase: 123 U/L (ref 38–126)
Anion gap: 9 (ref 5–15)
BUN: 12 mg/dL (ref 6–20)
CO2: 26 mmol/L (ref 22–32)
Calcium: 9.3 mg/dL (ref 8.9–10.3)
Chloride: 102 mmol/L (ref 98–111)
Creatinine, Ser: 0.83 mg/dL (ref 0.61–1.24)
GFR, Estimated: >60 mL/min (ref >60)
Glucose, Bld: 117 mg/dL — ABNORMAL HIGH (ref 70–99)
Potassium: 3.9 mmol/L (ref 3.5–5.1)
Sodium: 137 mmol/L (ref 135–145)
Total Bilirubin: 0.4 mg/dL (ref 0.3–1.2)
Total Protein: 8.3 g/dL — ABNORMAL HIGH (ref 6.5–8.1)

## 2022-10-24 LAB — CBC
HCT: 41.9 % (ref 39.0–52.0)
Hemoglobin: 14.5 g/dL (ref 13.0–17.0)
MCH: 31 pg (ref 26.0–34.0)
MCHC: 34.6 g/dL (ref 30.0–36.0)
MCV: 89.7 fL (ref 80.0–100.0)
Platelets: 292 10*3/uL (ref 150–400)
RBC: 4.67 MIL/uL (ref 4.22–5.81)
RDW: 12.7 % (ref 11.5–15.5)
WBC: 7.1 10*3/uL (ref 4.0–10.5)
nRBC: 0 % (ref 0.0–0.2)

## 2022-10-24 LAB — LIPASE, BLOOD: Lipase: 31 U/L (ref 11–51)

## 2022-10-24 NOTE — ED Triage Notes (Signed)
Pt to ED for headache, abd pain, diarrhea, nausea for a week.  Hx pancreatitis.

## 2022-10-27 ENCOUNTER — Encounter: Payer: Self-pay | Admitting: Internal Medicine

## 2022-10-27 ENCOUNTER — Encounter: Payer: Self-pay | Admitting: Nurse Practitioner

## 2022-10-27 ENCOUNTER — Ambulatory Visit: Payer: Medicare HMO | Admitting: Nurse Practitioner

## 2022-10-27 VITALS — BP 148/90 | HR 103 | Temp 97.8°F | Ht 73.0 in | Wt 202.2 lb

## 2022-10-27 DIAGNOSIS — R000 Tachycardia, unspecified: Secondary | ICD-10-CM | POA: Diagnosis not present

## 2022-10-27 DIAGNOSIS — R079 Chest pain, unspecified: Secondary | ICD-10-CM | POA: Diagnosis not present

## 2022-10-27 DIAGNOSIS — I10 Essential (primary) hypertension: Secondary | ICD-10-CM

## 2022-10-27 DIAGNOSIS — I517 Cardiomegaly: Secondary | ICD-10-CM | POA: Diagnosis not present

## 2022-10-27 DIAGNOSIS — H538 Other visual disturbances: Secondary | ICD-10-CM | POA: Diagnosis not present

## 2022-10-27 DIAGNOSIS — R9431 Abnormal electrocardiogram [ECG] [EKG]: Secondary | ICD-10-CM | POA: Diagnosis not present

## 2022-10-27 DIAGNOSIS — R519 Headache, unspecified: Secondary | ICD-10-CM | POA: Insufficient documentation

## 2022-10-27 DIAGNOSIS — I119 Hypertensive heart disease without heart failure: Secondary | ICD-10-CM | POA: Diagnosis not present

## 2022-10-27 NOTE — Assessment & Plan Note (Addendum)
Given patient symptoms new onset headache with visual changes, lightheadedness and elevated blood pressure need further evaluation. Advised patient to go to ER for neuroimaging and evaluation. Patient is agreeable to go to the ER.State would go to [facility] ER. Patient will go home and then will go to the ER with his daughter.

## 2022-10-27 NOTE — Progress Notes (Signed)
Established Patient Office Visit  Subjective:  Patient ID: [name], male    DOB: 06/10/1960  Age: 62 y.o. MRN: [id number]  CC:  Chief Complaint  Patient presents with   Acute Visit    Headache on left side of head and left eye blood vessel popped, high blood pressure, nauseated     HPI  [name] presents for headache and elevated blood pressure. The symptoms started about a week ago and is progressively worsening.  He has numbness and tingling sensation near the left eye and report pressure on the temporal area. Also reports visual changes in left eye. States not able to lay on the left side due to pain and pressure.   States had a fall from the ladder a month  ago hit his head on the stove has been to the ER for that reassuring CT of head on 09/26/22.  He has been monitoring his BP. The BP at home around high 140s/70s on amlodipine 5 mg twice a day. Had elevated BP 163/87on 8/23/ went to the Urgent  care and transferred to ED  but LWBS.   Patient states that he had never experienced this kind of headache in his life and the worst headache he ever experienced along with visual changes and lightheadedness. He is taking tylenol.and prn oxycodone for HD.   He also reports had redness of left eye few days ago but has been resolved now.  Medication change: Reports has started taking nitrous oxide and creatine from a month. He has not taken the supplement since last 2 weeks.   HPI   Past Medical History:  Diagnosis Date   Allergy    Anoxic brain injury [facility])    Arthritis    Awareness under anesthesia    need a lot to put me under   Chronic back pain    s/p 5 back surgeries   Coronary artery disease    Cardiac catheterization in 2007 at [facility] showed 30% stenosis in proximal LAD. No other disease. Ejection fraction was 65%   Depression    H/O acute pancreatitis    Hypercholesterolemia    Hypertension    Mild cognitive impairment with memory loss    Pancreatitis      Past Surgical History:  Procedure Laterality Date   abdomial surgery     BACK SURGERY     multiple back surgeries (5)   CARDIAC CATHETERIZATION     [facility]    CHOLECYSTECTOMY  2013   COLONOSCOPY N/A 03/05/2021   Procedure: COLONOSCOPY;  Surgeon: Jaynie Collins, DO;  Location: [facility] ENDOSCOPY;  Service: Gastroenterology;  Laterality: N/A;  IDDM   COLONOSCOPY WITH PROPOFOL N/A 06/30/2015   Procedure: COLONOSCOPY WITH PROPOFOL;  Surgeon: Christena Deem, MD;  Location: [facility] ENDOSCOPY;  Service: Endoscopy;  Laterality: N/A;   ESOPHAGOGASTRODUODENOSCOPY (EGD) WITH PROPOFOL N/A 03/30/2016   Procedure: ESOPHAGOGASTRODUODENOSCOPY (EGD) WITH PROPOFOL;  Surgeon: Scot Jun, MD;  Location: [facility] ENDOSCOPY;  Service: Endoscopy;  Laterality: N/A;   FUNCTIONAL ENDOSCOPIC SINUS SURGERY     JOINT REPLACEMENT     LAMINECTOMY     LUMBAR SPINE SURGERY     SHOULDER ARTHROSCOPY WITH ROTATOR CUFF REPAIR AND OPEN BICEPS TENODESIS Right    SHOULDER ARTHROSCOPY WITH ROTATOR CUFF REPAIR AND OPEN BICEPS TENODESIS     SPINE SURGERY     TRIGGER POINT INJECTION     VASECTOMY     With Reversal   VEIN SURGERY Right 2013   arm  Family History  Problem Relation Age of Onset   Heart disease Father    Heart attack Father    Heart disease Mother    Heart attack Mother    Heart attack Brother        MI's x 2    Hypertension Other    Colon cancer Other    Hypertension Sister    Heart attack Paternal Grandfather    Hypertension Sister     Social History   Socioeconomic History   Marital status: Divorced    Spouse name: Not on file   Number of children: 3   Years of education: Not on file   Highest education level: Not on file  Occupational History   Not on file  Tobacco Use   Smoking status: Never   Smokeless tobacco: Never  Vaping Use   Vaping status: Never Used  Substance and Sexual Activity   Alcohol use: No    Alcohol/week: 0.0 standard drinks of alcohol   Drug use: No   Sexual  activity: Yes  Other Topics Concern   Not on file  Social History Narrative   Not on file   Social Determinants of Health   Financial Resource Strain: Low Risk  (12/29/2020)   Overall Financial Resource Strain (CARDIA)    Difficulty of Paying Living Expenses: Not hard at all  Food Insecurity: No Food Insecurity (03/19/2021)   Received from [facility]   Hunger Vital Sign  Transportation Needs: No Transportation Needs (03/19/2021)   Received from [facility]   PRAPARE - Transportation  Physical Activity: Not on file  Stress: No Stress Concern Present (12/29/2020)   [employer] of Occupational Health - Occupational Stress Questionnaire    Feeling of Stress : Not at all  Social Connections: Unknown (12/29/2020)   Social Connection and Isolation Panel [NHANES]    Frequency of Communication with Friends and Family: More than three times a week    Frequency of Social Gatherings with Friends and Family: More than three times a week    Attends Religious Services: Not on file    Active Member of Clubs or Organizations: Not on file    Attends Banker Meetings: Not on file    Marital Status: Not on file  Intimate Partner Violence: Not At Risk (12/29/2020)   Humiliation, Afraid, Rape, and Kick questionnaire    Fear of Current or Ex-Partner: No    Emotionally Abused: No    Physically Abused: No    Sexually Abused: No     Outpatient Medications Prior to Visit  Medication Sig Dispense Refill   amLODipine (NORVASC) 5 MG tablet Take 1 tablet (5 mg total) by mouth 2 (two) times daily. 180 tablet 1   atomoxetine (STRATTERA) 100 MG capsule Take 100 mg by mouth daily.     desvenlafaxine (PRISTIQ) 100 MG 24 hr tablet Take 100 mg by mouth daily.     escitalopram (LEXAPRO) 10 MG tablet Take 10 mg by mouth daily.     gabapentin (NEURONTIN) 300 MG capsule Take 1 or 2 capsules by mouth TID     hydrOXYzine (ATARAX) 10 MG tablet TAKE ONE TAB AS  NEEDED FOR ITCHING AT BEDTIME     ketoconazole (NIZORAL) 2 % shampoo Apply topically.     modafinil (PROVIGIL) 200 MG tablet Take 400 mg by mouth in the morning.     mupirocin ointment (BACTROBAN) 2 % APPLY TO AFFECTED AREA 3 TIMES A DAY FOR 7 DAYS  ondansetron (ZOFRAN-ODT) 4 MG disintegrating tablet Take 1 tablet (4 mg total) by mouth every 6 (six) hours as needed for nausea or vomiting. 20 tablet 0   oxyCODONE (OXY IR/ROXICODONE) 5 MG immediate release tablet Take 5-10 mg by mouth at bedtime as needed for moderate pain.   0   rosuvastatin (CRESTOR) 5 MG tablet Take 1 tablet (5 mg total) by mouth daily. 90 tablet 3   sildenafil (REVATIO) 20 MG tablet TAKE 4 - 5 TABLETS BY MOUTH DAILY AS NEEDED  3   sucralfate (CARAFATE) 1 g tablet Take 1 g by mouth daily.   1   tamsulosin (FLOMAX) 0.4 MG CAPS capsule Take 0.4 mg by mouth daily.     tiZANidine (ZANAFLEX) 4 MG tablet TAKE 1 - 2 TABLETS BY MOUTH THREE TIMES DAILY AS NEEDED     traZODone (DESYREL) 100 MG tablet Take 1 tablet (100 mg total) by mouth at bedtime. Do not take with oxycodone or muscle relaxer. 90 tablet 1   Adalimumab 40 MG/0.4ML PNKT Inject 40 mg into the skin every 14 (fourteen) days.     No facility-administered medications prior to visit.    Allergies  Allergen Reactions   Corticosteroids Other (See Comments)    Pancreatitis   Penicillins Anaphylaxis    Has patient had a PCN reaction causing immediate rash, facial/tongue/throat swelling, SOB or lightheadedness with hypotension: no Has patient had a PCN reaction causing severe rash involving mucus membranes or skin necrosis: yes Has patient had a PCN reaction that required hospitalization: yes Has patient had a PCN reaction occurring within the last 10 years: no If all of the above answers are "NO", then may proceed with Cephalosporin use.    Sulfa Antibiotics Other (See Comments)    Unknown reaction   Sulfasalazine    Plaquenil [Hydroxychloroquine] Rash   Protonix  [Pantoprazole] Rash    ROS Review of Systems Negative unless indicated in HPI.    Objective:    Physical Exam Constitutional:      Appearance: Normal appearance. He is normal weight.  HENT:     Head: Normocephalic.     Right Ear: Tympanic membrane normal.     Left Ear: Tympanic membrane normal.  Eyes:     Extraocular Movements: Extraocular movements intact.     Pupils: Pupils are equal, round, and reactive to light.  Cardiovascular:     Rate and Rhythm: Normal rate and regular rhythm.     Pulses: Normal pulses.     Heart sounds: Normal heart sounds. No murmur heard. Pulmonary:     Effort: Pulmonary effort is normal.     Breath sounds: Normal breath sounds. No stridor. No wheezing.  Musculoskeletal:     Cervical back: Normal range of motion.  Neurological:     General: No focal deficit present.     Mental Status: He is alert and oriented to person, place, and time. Mental status is at baseline.     Comments: Tenderness at left temporal area.     BP (!) 148/90   Pulse (!) 103   Temp 97.8 F (36.6 C)   Ht 6\' 1"  (1.854 m)   Wt 202 lb 3.2 oz (91.7 kg)   SpO2 99%   BMI 26.68 kg/m  Wt Readings from Last 3 Encounters:  10/27/22 202 lb 3.2 oz (91.7 kg)  10/24/22 198 lb (89.8 kg)  09/16/22 196 lb 3.4 oz (89 kg)     Health Maintenance  Topic Date Due   Medicare Annual  Wellness (AWV)  12/29/2021   COVID-19 Vaccine (8 - 2023-24 season) 11/12/2022 (Originally 10/02/2022)   Colonoscopy  03/06/2031   DTaP/Tdap/Td (3 - Td or Tdap) 05/11/2031   INFLUENZA VACCINE  Completed   Hepatitis C Screening  Completed   HIV Screening  Completed   Zoster Vaccines- Shingrix  Completed   HPV VACCINES  Aged Out    There are no preventive care reminders to display for this patient.  Lab Results  Component Value Date   TSH 0.99 03/07/2022   Lab Results  Component Value Date   WBC 7.1 10/24/2022   HGB 14.5 10/24/2022   HCT 41.9 10/24/2022   MCV 89.7 10/24/2022   PLT 292  10/24/2022   Lab Results  Component Value Date   NA 137 10/24/2022   K 3.9 10/24/2022   CO2 26 10/24/2022   GLUCOSE 117 (H) 10/24/2022   BUN 12 10/24/2022   CREATININE 0.83 10/24/2022   BILITOT 0.4 10/24/2022   ALKPHOS 123 10/24/2022   AST 33 10/24/2022   ALT 38 10/24/2022   PROT 8.3 (H) 10/24/2022   ALBUMIN 4.2 10/24/2022   CALCIUM 9.3 10/24/2022   ANIONGAP 9 10/24/2022   GFR 90.82 07/06/2022   Lab Results  Component Value Date   CHOL 131 07/06/2022   Lab Results  Component Value Date   HDL 53.10 07/06/2022   Lab Results  Component Value Date   LDLCALC 58 07/06/2022   Lab Results  Component Value Date   TRIG 99.0 07/06/2022   Lab Results  Component Value Date   CHOLHDL 2 07/06/2022   Lab Results  Component Value Date   HGBA1C 5.6 09/23/2019      Assessment & Plan:  New onset of headaches Assessment & Plan: Given patient symptoms new onset headache with visual changes, lightheadedness and elevated blood pressure need further evaluation. Advised patient to go to ER for neuroimaging and evaluation. Patient is agreeable to go to the ER.State would go to [facility]. Patient will go home and then will go to the ER with his daughter.   Primary hypertension Assessment & Plan: Elevated BP X 2 readings. Advised pt to follow a low sodium and heart healthy diet. Recommend further evaluation due to new onset of headache.      Follow-up: No follow-ups on file.   Kara Dies, NP

## 2022-10-27 NOTE — Assessment & Plan Note (Signed)
Elevated BP X 2 readings. Advised pt to follow a low sodium and heart healthy diet. Recommend further evaluation due to new onset of headache.

## 2022-10-27 NOTE — Telephone Encounter (Signed)
Noted  

## 2022-10-27 NOTE — Patient Instructions (Signed)
Given patient symptoms advised pt to go to the ER for further evaluation.

## 2022-10-28 DIAGNOSIS — R519 Headache, unspecified: Secondary | ICD-10-CM | POA: Diagnosis not present

## 2022-11-02 ENCOUNTER — Encounter: Payer: Self-pay | Admitting: Internal Medicine

## 2022-11-03 ENCOUNTER — Encounter: Payer: Self-pay | Admitting: Nurse Practitioner

## 2022-11-03 ENCOUNTER — Ambulatory Visit (INDEPENDENT_AMBULATORY_CARE_PROVIDER_SITE_OTHER): Payer: Medicare HMO | Admitting: Nurse Practitioner

## 2022-11-03 VITALS — BP 134/84 | HR 91 | Temp 98.0°F | Ht 73.0 in | Wt 196.0 lb

## 2022-11-03 DIAGNOSIS — R519 Headache, unspecified: Secondary | ICD-10-CM | POA: Diagnosis not present

## 2022-11-03 MED ORDER — METHYLPREDNISOLONE 4 MG PO TBPK
ORAL_TABLET | ORAL | 0 refills | Status: DC
Start: 2022-11-03 — End: 2023-01-03

## 2022-11-03 NOTE — Assessment & Plan Note (Signed)
Temporal artery tenderness. Will start on Medrol Dosepak.  Inflammation marker pending. Referred to neurology for further evaluation.

## 2022-11-03 NOTE — Progress Notes (Signed)
Established Patient Office Visit  Subjective:  Patient ID: [name], male    DOB: December 19, 1960  Age: 62 y.o. MRN: [id number]  CC:  Chief Complaint  Patient presents with   Headache    HPI  [name] presents for headaches. He has h/o rheumatoid arthritis, chronic pain syndrome, degenerative disc disease, chronic myofascial pain and hypertension.    He sates the headache started in the mid of September accompanied L sided blurry vision, photophobia  and temporal headache.  Was seen at [facility] ED with CT head and neck negative and inflammation marker negative (Sed rate: 17 and CRP: 0.45) on 10/27/22.  Reports had a fall from the ladder a month ago and hit is HD on the stove was seen in the ED after the fall, CT head negative on 09/26/22.  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 Has no h/o color blindness.    No history of migraine. He describe the pain as throbbing and pressure in the back on the left eye and left temporal region. Denise chest pain, neck pain, fever.  CTA HEAD AND NECK WITH AND WITHOUT CONTRAST  IMPRESSION:  1. No intracranial large vessel occlusion or findings to suggest  vasculitis.   2. No hemodynamically significant arterial stenosis in the neck within the  limitations of nonvisualization of the bilateral vertebral artery origins  due to venous reflux of contrast bolus and associated streak artifact.   3.  Apparent focal 2.0 cm area of hypoattenuation within the left thyroid  lobe; this could be artifactual versus reflect a new thyroid nodule and was  not definitively seen on prior CT chest 03/31/2022. Recommend nonemergent  dedicated thyroid ultrasound for further evaluation.  HPI   Past Medical History:  Diagnosis Date   Allergy    Anoxic brain injury [facility])    Arthritis    Awareness under anesthesia    need a lot to put me under   Chronic back pain    s/p 5 back  surgeries   Coronary artery disease    Cardiac catheterization in 2007 at [facility] showed 30% stenosis in proximal LAD. No other disease. Ejection fraction was 65%   Depression    H/O acute pancreatitis    Hypercholesterolemia    Hypertension    Mild cognitive impairment with memory loss    Pancreatitis     Past Surgical History:  Procedure Laterality Date   abdomial surgery     BACK SURGERY     multiple back surgeries (5)   CARDIAC CATHETERIZATION     [facility]    CHOLECYSTECTOMY  2013   COLONOSCOPY N/A 03/05/2021   Procedure: COLONOSCOPY;  Surgeon: Jaynie Collins, DO;  Location: [facility] ENDOSCOPY;  Service: Gastroenterology;  Laterality: N/A;  IDDM   COLONOSCOPY WITH PROPOFOL N/A 06/30/2015   Procedure: COLONOSCOPY WITH PROPOFOL;  Surgeon: Christena Deem, MD;  Location: [facility] ENDOSCOPY;  Service: Endoscopy;  Laterality: N/A;   ESOPHAGOGASTRODUODENOSCOPY (EGD) WITH PROPOFOL N/A 03/30/2016   Procedure: ESOPHAGOGASTRODUODENOSCOPY (EGD) WITH PROPOFOL;  Surgeon: Scot Jun, MD;  Location: [facility] ENDOSCOPY;  Service: Endoscopy;  Laterality: N/A;   FUNCTIONAL ENDOSCOPIC SINUS SURGERY     JOINT REPLACEMENT     LAMINECTOMY     LUMBAR SPINE SURGERY     SHOULDER ARTHROSCOPY WITH ROTATOR CUFF REPAIR AND OPEN BICEPS TENODESIS Right    SHOULDER ARTHROSCOPY WITH ROTATOR CUFF REPAIR AND OPEN BICEPS TENODESIS  SPINE SURGERY     TRIGGER POINT INJECTION     VASECTOMY     With Reversal   VEIN SURGERY Right 2013   arm    Family History  Problem Relation Age of Onset   Heart disease Father    Heart attack Father    Heart disease Mother    Heart attack Mother    Heart attack Brother        MI's x 2    Hypertension Other    Colon cancer Other    Hypertension Sister    Heart attack Paternal Grandfather    Hypertension Sister     Social History   Socioeconomic History   Marital status: Divorced    Spouse name: Not on file   Number of children: 3   Years of education: Not on file    Highest education level: Not on file  Occupational History   Not on file  Tobacco Use   Smoking status: Never   Smokeless tobacco: Never  Vaping Use   Vaping status: Never Used  Substance and Sexual Activity   Alcohol use: No    Alcohol/week: 0.0 standard drinks of alcohol   Drug use: No   Sexual activity: Yes  Other Topics Concern   Not on file  Social History Narrative   Not on file   Social Determinants of Health   Financial Resource Strain: Low Risk  (12/29/2020)   Overall Financial Resource Strain (CARDIA)    Difficulty of Paying Living Expenses: Not hard at all  Food Insecurity: No Food Insecurity (03/19/2021)   [facility] from [facility]   Hunger Vital Sign  Transportation Needs: No Transportation Needs (03/19/2021)   Received from [facility]   PRAPARE - Transportation  Physical Activity: Not on file  Stress: No Stress Concern Present (12/29/2020)   [employer] of Occupational Health - Occupational Stress Questionnaire    Feeling of Stress : Not at all  Social Connections: Unknown (12/29/2020)   Social Connection and Isolation Panel [NHANES]    Frequency of Communication with Friends and Family: More than three times a week    Frequency of Social Gatherings with Friends and Family: More than three times a week    Attends Religious Services: Not on file    Active Member of Clubs or Organizations: Not on file    Attends Banker Meetings: Not on file    Marital Status: Not on file  Intimate Partner Violence: Not At Risk (12/29/2020)   Humiliation, Afraid, Rape, and Kick questionnaire    Fear of Current or Ex-Partner: No    Emotionally Abused: No    Physically Abused: No    Sexually Abused: No     Outpatient Medications Prior to Visit  Medication Sig Dispense Refill   amLODipine (NORVASC) 5 MG tablet Take 1 tablet (5 mg total) by mouth 2 (two) times daily. 180 tablet 1   atomoxetine (STRATTERA) 100 MG capsule  Take 100 mg by mouth daily.     desvenlafaxine (PRISTIQ) 100 MG 24 hr tablet Take 100 mg by mouth daily.     escitalopram (LEXAPRO) 10 MG tablet Take 10 mg by mouth daily.     gabapentin (NEURONTIN) 300 MG capsule Take 1 or 2 capsules by mouth TID     hydrOXYzine (ATARAX) 10 MG tablet TAKE ONE TAB AS NEEDED FOR ITCHING AT BEDTIME     ketoconazole (NIZORAL) 2 % shampoo Apply topically.     modafinil (  PROVIGIL) 200 MG tablet Take 400 mg by mouth in the morning.     mupirocin ointment (BACTROBAN) 2 % APPLY TO AFFECTED AREA 3 TIMES A DAY FOR 7 DAYS     ondansetron (ZOFRAN-ODT) 4 MG disintegrating tablet Take 1 tablet (4 mg total) by mouth every 6 (six) hours as needed for nausea or vomiting. 20 tablet 0   oxyCODONE (OXY IR/ROXICODONE) 5 MG immediate release tablet Take 5-10 mg by mouth at bedtime as needed for moderate pain.   0   rosuvastatin (CRESTOR) 5 MG tablet Take 1 tablet (5 mg total) by mouth daily. 90 tablet 3   sildenafil (REVATIO) 20 MG tablet TAKE 4 - 5 TABLETS BY MOUTH DAILY AS NEEDED  3   sucralfate (CARAFATE) 1 g tablet Take 1 g by mouth daily.   1   tamsulosin (FLOMAX) 0.4 MG CAPS capsule Take 0.4 mg by mouth daily.     tiZANidine (ZANAFLEX) 4 MG tablet TAKE 1 - 2 TABLETS BY MOUTH THREE TIMES DAILY AS NEEDED     traZODone (DESYREL) 100 MG tablet Take 1 tablet (100 mg total) by mouth at bedtime. Do not take with oxycodone or muscle relaxer. 90 tablet 1   COMIRNATY syringe      FLUCELVAX 0.5 ML injection      Adalimumab 40 MG/0.4ML PNKT Inject 40 mg into the skin every 14 (fourteen) days.     No facility-administered medications prior to visit.    Allergies  Allergen Reactions   Corticosteroids Other (See Comments)    Pancreatitis   Penicillins Anaphylaxis    Has patient had a PCN reaction causing immediate rash, facial/tongue/throat swelling, SOB or lightheadedness with hypotension: no Has patient had a PCN reaction causing severe rash involving mucus membranes or skin  necrosis: yes Has patient had a PCN reaction that required hospitalization: yes Has patient had a PCN reaction occurring within the last 10 years: no If all of the above answers are "NO", then may proceed with Cephalosporin use.    Sulfa Antibiotics Other (See Comments)    Unknown reaction   Sulfasalazine    Plaquenil [Hydroxychloroquine] Rash   Protonix [Pantoprazole] Rash    ROS Review of Systems Negative unless indicated in HPI.    Objective:    Physical Exam Constitutional:      Appearance: Normal appearance.  HENT:     Head: Normocephalic.  Cardiovascular:     Rate and Rhythm: Normal rate and regular rhythm.     Pulses: Normal pulses.     Heart sounds: Normal heart sounds.  Pulmonary:     Effort: Pulmonary effort is normal.     Breath sounds: Normal breath sounds.  Musculoskeletal:        General: Normal range of motion.     Cervical back: Normal range of motion.  Neurological:     General: No focal deficit present.     Mental Status: He is alert.     Motor: No weakness.     Comments: tendrness to left temporal area  Psychiatric:        Mood and Affect: Mood normal.        Behavior: Behavior normal.        Thought Content: Thought content normal.        Judgment: Judgment normal.     BP 134/84   Pulse 91   Temp 98 F (36.7 C)   Ht 6\' 1"  (1.854 m)   Wt 196 lb (88.9 kg)   SpO2  97%   BMI 25.86 kg/m  Wt Readings from Last 3 Encounters:  11/03/22 196 lb (88.9 kg)  10/27/22 202 lb 3.2 oz (91.7 kg)  10/24/22 198 lb (89.8 kg)     Health Maintenance  Topic Date Due   Medicare Annual Wellness (AWV)  12/29/2021   COVID-19 Vaccine (9 - 2023-24 season) 11/23/2022   Colonoscopy  03/06/2031   DTaP/Tdap/Td (3 - Td or Tdap) 05/11/2031   INFLUENZA VACCINE  Completed   Hepatitis C Screening  Completed   HIV Screening  Completed   Zoster Vaccines- Shingrix  Completed   HPV VACCINES  Aged Out    There are no preventive care reminders to display for this  patient.  Lab Results  Component Value Date   TSH 0.99 03/07/2022   Lab Results  Component Value Date   WBC 7.1 10/24/2022   HGB 14.5 10/24/2022   HCT 41.9 10/24/2022   MCV 89.7 10/24/2022   PLT 292 10/24/2022   Lab Results  Component Value Date   NA 137 10/24/2022   K 3.9 10/24/2022   CO2 26 10/24/2022   GLUCOSE 117 (H) 10/24/2022   BUN 12 10/24/2022   CREATININE 0.83 10/24/2022   BILITOT 0.4 10/24/2022   ALKPHOS 123 10/24/2022   AST 33 10/24/2022   ALT 38 10/24/2022   PROT 8.3 (H) 10/24/2022   ALBUMIN 4.2 10/24/2022   CALCIUM 9.3 10/24/2022   ANIONGAP 9 10/24/2022   GFR 90.82 07/06/2022   Lab Results  Component Value Date   CHOL 131 07/06/2022   Lab Results  Component Value Date   HDL 53.10 07/06/2022   Lab Results  Component Value Date   LDLCALC 58 07/06/2022   Lab Results  Component Value Date   TRIG 99.0 07/06/2022   Lab Results  Component Value Date   CHOLHDL 2 07/06/2022   Lab Results  Component Value Date   HGBA1C 5.6 09/23/2019      Assessment & Plan:  Acute intractable headache, unspecified headache type Assessment & Plan: Temporal artery tenderness. Will start on Medrol Dosepak.  Inflammation marker pending. Referred to neurology for further evaluation.  Orders: -     Sedimentation rate -     C-reactive protein -     methylPREDNISolone; As indicated on the pack.  Dispense: 21 each; Refill: 0 -     Ambulatory referral to Neurology    Follow-up: Return if symptoms worsen or fail to improve.   Kara Dies, NP

## 2022-11-03 NOTE — Telephone Encounter (Signed)
Called patient. Still having head ache today. Wanting medication. Scheduled with Charanpreet this PM.

## 2022-11-04 ENCOUNTER — Encounter: Payer: Self-pay | Admitting: Nurse Practitioner

## 2022-11-04 LAB — SEDIMENTATION RATE: Sed Rate: 25 mm/h — ABNORMAL HIGH (ref 0–20)

## 2022-11-04 LAB — C-REACTIVE PROTEIN: CRP: <1 mg/dL (ref 0.5–20.0)

## 2022-11-09 ENCOUNTER — Encounter: Payer: Self-pay | Admitting: Internal Medicine

## 2022-11-09 ENCOUNTER — Ambulatory Visit: Payer: Medicare HMO | Admitting: Internal Medicine

## 2022-11-09 VITALS — BP 124/72 | HR 83 | Temp 98.2°F | Resp 16 | Ht 73.0 in | Wt 199.0 lb

## 2022-11-09 DIAGNOSIS — E041 Nontoxic single thyroid nodule: Secondary | ICD-10-CM

## 2022-11-09 DIAGNOSIS — D649 Anemia, unspecified: Secondary | ICD-10-CM | POA: Diagnosis not present

## 2022-11-09 DIAGNOSIS — I77819 Aortic ectasia, unspecified site: Secondary | ICD-10-CM | POA: Diagnosis not present

## 2022-11-09 DIAGNOSIS — E7800 Pure hypercholesterolemia, unspecified: Secondary | ICD-10-CM | POA: Diagnosis not present

## 2022-11-09 DIAGNOSIS — Z8719 Personal history of other diseases of the digestive system: Secondary | ICD-10-CM | POA: Diagnosis not present

## 2022-11-09 DIAGNOSIS — M069 Rheumatoid arthritis, unspecified: Secondary | ICD-10-CM | POA: Diagnosis not present

## 2022-11-09 DIAGNOSIS — F320 Major depressive disorder, single episode, mild: Secondary | ICD-10-CM

## 2022-11-09 DIAGNOSIS — G8929 Other chronic pain: Secondary | ICD-10-CM | POA: Diagnosis not present

## 2022-11-09 DIAGNOSIS — R748 Abnormal levels of other serum enzymes: Secondary | ICD-10-CM | POA: Diagnosis not present

## 2022-11-09 DIAGNOSIS — R519 Headache, unspecified: Secondary | ICD-10-CM | POA: Diagnosis not present

## 2022-11-09 DIAGNOSIS — M545 Low back pain, unspecified: Secondary | ICD-10-CM | POA: Diagnosis not present

## 2022-11-09 DIAGNOSIS — I10 Essential (primary) hypertension: Secondary | ICD-10-CM | POA: Diagnosis not present

## 2022-11-09 LAB — HEPATIC FUNCTION PANEL
ALT: 27 U/L (ref 0–53)
AST: 23 U/L (ref 0–37)
Albumin: 4.3 g/dL (ref 3.5–5.2)
Alkaline Phosphatase: 87 U/L (ref 39–117)
Bilirubin, Direct: 0.1 mg/dL (ref 0.0–0.3)
Total Bilirubin: 0.4 mg/dL (ref 0.2–1.2)
Total Protein: 7 g/dL (ref 6.0–8.3)

## 2022-11-09 LAB — LIPID PANEL
Cholesterol: 125 mg/dL (ref 0–200)
HDL: 62.8 mg/dL (ref >39.00)
LDL Cholesterol: 44 mg/dL (ref 0–99)
NonHDL: 61.91
Total CHOL/HDL Ratio: 2
Triglycerides: 90 mg/dL (ref 0.0–149.0)
VLDL: 18 mg/dL (ref 0.0–40.0)

## 2022-11-09 LAB — BASIC METABOLIC PANEL
BUN: 11 mg/dL (ref 6–23)
CO2: 28 meq/L (ref 19–32)
Calcium: 9.3 mg/dL (ref 8.4–10.5)
Chloride: 103 meq/L (ref 96–112)
Creatinine, Ser: 0.92 mg/dL (ref 0.40–1.50)
GFR: 90.6 mL/min (ref >60.00)
Glucose, Bld: 90 mg/dL (ref 70–99)
Potassium: 4.4 meq/L (ref 3.5–5.1)
Sodium: 138 meq/L (ref 135–145)

## 2022-11-09 LAB — TSH: TSH: 1.09 u[IU]/mL (ref 0.35–5.50)

## 2022-11-09 LAB — LIPASE: Lipase: 42 U/L (ref 11.0–59.0)

## 2022-11-09 NOTE — Assessment & Plan Note (Signed)
Aortic root ectasia - 4cm (CT chest - [facility] 03/31/2022).  Followed by cardiology.  

## 2022-11-09 NOTE — Assessment & Plan Note (Signed)
Headache as outlined. Recently evaluated for headaches and high blood pressure. Reports he hit his head after a fall off a ladder in 09/2022. Around mid September, he started having a headache - left sided in the temple region and behind his eye. Headache accompanied by blurry vision and photophobia - left eye. No jaw claudication. Evaluated in ER 10/27/22.  Ophthalmology evaluation while in ER - vision 20/20 OU, pupils reactive.  Fundus exam unremarkable and normal optic nerve appearance. CTA (head and neck) - no intracranial large vessel occlusion or other lesions. He had f/u here in the office after ER visit.  Placed on prednisone.  ESR - 25. Is feeling better.  Headache is better.  Still having pain - left side head, but has improved.  No rash.  No eye symptoms now. No dizziness or light headedness. Discussed possible etiology if headache.  Discussed no evidence of vasculitis, CTA unrevealing and ESR ok.  Will hold on additional medication given improving symptoms.  Complete prednisone.  Neurology evaluation for further evaluation and treatment.

## 2022-11-09 NOTE — Assessment & Plan Note (Signed)
He has a persistent mild alk phos abnormality with no significant transaminitis. He has a very significant medical history and surgical history for pancreatitis and recurrent pancreatic interventions. His FibroScan score on 03/2020 has been low and reassuring and work-up for biliary disease activity both by serologies entheses antimitochondrial antibody - March 2023) and radiologically has been negative. He has been followed by GI - [facility] - and was recommended to repeat his transient elastography score every 3 years so long as alk phos remains abnormal. He just had the liver enzymes performed recently and now has a normal ALK phos.  She recommended have him return in 1 year.  With the recently normal alkaline phosphatase, Dr Colin Broach recommended to follow up with fibroscan and alk phos. Discussed with him today.  He is interested in getting his GI care locally.  Has seen [facility] for his colonoscopy.  Would like to have them follow his liver as well.  Will schedule f/u with GI.

## 2022-11-09 NOTE — Assessment & Plan Note (Signed)
Previous admission for bloody/black stool.  W/up as outlined in previous note.  Has had recent colonoscopy 03/2021. No further bleeding.  Request to have GI care locally.  Has seen [facility].  Arrange f/u as outlined.

## 2022-11-09 NOTE — Assessment & Plan Note (Signed)
Followed by pain clinic/[facility].  Stable. Seeing Dr Lynn Ito - f/u low back pain and neck pain.  Pain fairly controlled with medications, exercise and trigger point injections.  Receives trigger point injections and recommended to continue gabapentin and tizanidine.

## 2022-11-09 NOTE — Assessment & Plan Note (Signed)
Being followed by rheumatology.  On humira.  Stable.  

## 2022-11-09 NOTE — Progress Notes (Signed)
Subjective:    Patient ID: [name], male    DOB: October 31, 1960, 62 y.o.   MRN: [id number]  Patient here for  Chief Complaint  Patient presents with   Medical Management of Chronic Issues    HPI Here for a scheduled follow up. Recently evaluated for headaches and high blood pressure. Reports he hit his head after a fall off a ladder in 09/2022. Around mid September, he started having a headache - left sided in the temple region and behind his eye. Headache accompanied by blurry vision and photophobia - left eye. No jaw claudication. Evaluated in ER 10/27/22.  Ophthalmology evaluation while in ER - vision 20/20 OU, pupils reactive.  Fundus exam unremarkable and normal optic nerve appearance. CTA (head and neck) - no intracranial large vessel occlusion or other lesions. Did have decreased color vision in both eyes. He had f/u here in the office after ER visit.  Placed on prednisone.  ESR - 25. Is feeling better.  Headache is better.  Still having pain - left side head, but has improved.  No rash.  No eye symptoms now. No dizziness or light headedness. Continues to be followed by pain clinic.  Received trigger point injections - chronic back pain. Of note, on CTA - thyroid nodule found incidentally.  Discussed need for f/u thyroid ultrasound.    Past Medical History:  Diagnosis Date   Allergy    Anoxic brain injury [facility])    Arthritis    Awareness under anesthesia    need a lot to put me under   Chronic back pain    s/p 5 back surgeries   Coronary artery disease    Cardiac catheterization in 2007 at [facility] showed 30% stenosis in proximal LAD. No other disease. Ejection fraction was 65%   Depression    H/O acute pancreatitis    Hypercholesterolemia    Hypertension    Mild cognitive impairment with memory loss    Pancreatitis    Past Surgical History:  Procedure Laterality Date   abdomial surgery     BACK SURGERY     multiple back surgeries (5)   CARDIAC CATHETERIZATION     [facility]     CHOLECYSTECTOMY  2013   COLONOSCOPY N/A 03/05/2021   Procedure: COLONOSCOPY;  Surgeon: Jaynie Collins, DO;  Location: [facility] ENDOSCOPY;  Service: Gastroenterology;  Laterality: N/A;  IDDM   COLONOSCOPY WITH PROPOFOL N/A 06/30/2015   Procedure: COLONOSCOPY WITH PROPOFOL;  Surgeon: Christena Deem, MD;  Location: [facility] ENDOSCOPY;  Service: Endoscopy;  Laterality: N/A;   ESOPHAGOGASTRODUODENOSCOPY (EGD) WITH PROPOFOL N/A 03/30/2016   Procedure: ESOPHAGOGASTRODUODENOSCOPY (EGD) WITH PROPOFOL;  Surgeon: Scot Jun, MD;  Location: [facility] ENDOSCOPY;  Service: Endoscopy;  Laterality: N/A;   FUNCTIONAL ENDOSCOPIC SINUS SURGERY     JOINT REPLACEMENT     LAMINECTOMY     LUMBAR SPINE SURGERY     SHOULDER ARTHROSCOPY WITH ROTATOR CUFF REPAIR AND OPEN BICEPS TENODESIS Right    SHOULDER ARTHROSCOPY WITH ROTATOR CUFF REPAIR AND OPEN BICEPS TENODESIS     SPINE SURGERY     TRIGGER POINT INJECTION     VASECTOMY     With Reversal   VEIN SURGERY Right 2013   arm   Family History  Problem Relation Age of Onset   Heart disease Father    Heart attack Father    Heart disease Mother    Heart attack Mother    Heart attack Brother  MI's x 2    Hypertension Other    Colon cancer Other    Hypertension Sister    Heart attack Paternal Grandfather    Hypertension Sister    Social History   Socioeconomic History   Marital status: Divorced    Spouse name: Not on file   Number of children: 3   Years of education: Not on file   Highest education level: Not on file  Occupational History   Not on file  Tobacco Use   Smoking status: Never   Smokeless tobacco: Never  Vaping Use   Vaping status: Never Used  Substance and Sexual Activity   Alcohol use: No    Alcohol/week: 0.0 standard drinks of alcohol   Drug use: No   Sexual activity: Yes  Other Topics Concern   Not on file  Social History Narrative   Not on file   Social Determinants of Health   Financial Resource Strain: Low Risk   (12/29/2020)   Overall Financial Resource Strain (CARDIA)    Difficulty of Paying Living Expenses: Not hard at all  Food Insecurity: No Food Insecurity (03/19/2021)   Received [facility]   Hunger Vital Sign  Transportation Needs: No Transportation Needs (03/19/2021)   Received from [facility]   PRAPARE - Transportation  Physical Activity: Not on file  Stress: No Stress Concern Present (12/29/2020)   [employer] of Occupational Health - Occupational Stress Questionnaire    Feeling of Stress : Not at all  Social Connections: Unknown (12/29/2020)   Social Connection and Isolation Panel [NHANES]    Frequency of Communication with Friends and Family: More than three times a week    Frequency of Social Gatherings with Friends and Family: More than three times a week    Attends Religious Services: Not on file    Active Member of Clubs or Organizations: Not on file    Attends Banker Meetings: Not on file    Marital Status: Not on file     Review of Systems  Constitutional:  Negative for appetite change, fever and unexpected weight change.  HENT:  Negative for congestion and sinus pressure.   Respiratory:  Negative for cough, chest tightness and shortness of breath.   Cardiovascular:  Negative for chest pain and palpitations.  Gastrointestinal:  Negative for abdominal pain, diarrhea, nausea and vomiting.  Genitourinary:  Negative for difficulty urinating and dysuria.  Musculoskeletal:  Negative for joint swelling and myalgias.  Skin:  Negative for color change and rash.  Neurological:  Positive for headaches. Negative for dizziness.  Psychiatric/Behavioral:  Negative for agitation and dysphoric mood.        Objective:     BP 124/72   Pulse 83   Temp 98.2 F (36.8 C)   Resp 16   Ht 6\' 1"  (1.854 m)   Wt 199 lb (90.3 kg)   SpO2 98%   BMI 26.25 kg/m  Wt Readings from Last 3 Encounters:  11/09/22 199 lb (90.3 kg)   11/03/22 196 lb (88.9 kg)  10/27/22 202 lb 3.2 oz (91.7 kg)    Physical Exam Vitals reviewed.  Constitutional:      General: He is not in acute distress.    Appearance: Normal appearance. He is well-developed.  HENT:     Head: Normocephalic and atraumatic.     Right Ear: External ear normal.     Left Ear: External ear normal.  Eyes:     General: No  scleral icterus.       Right eye: No discharge.        Left eye: No discharge.     Conjunctiva/sclera: Conjunctivae normal.  Cardiovascular:     Rate and Rhythm: Normal rate and regular rhythm.  Pulmonary:     Effort: Pulmonary effort is normal. No respiratory distress.     Breath sounds: Normal breath sounds.  Abdominal:     General: Bowel sounds are normal.     Palpations: Abdomen is soft.     Tenderness: There is no abdominal tenderness.  Musculoskeletal:        General: No swelling or tenderness.     Cervical back: Neck supple. No tenderness.  Lymphadenopathy:     Cervical: No cervical adenopathy.  Skin:    Findings: No erythema or rash.  Neurological:     Mental Status: He is alert.  Psychiatric:        Mood and Affect: Mood normal.        Behavior: Behavior normal.      Outpatient Encounter Medications as of 11/09/2022  Medication Sig   amLODipine (NORVASC) 5 MG tablet Take 1 tablet (5 mg total) by mouth 2 (two) times daily.   atomoxetine (STRATTERA) 100 MG capsule Take 100 mg by mouth daily.   desvenlafaxine (PRISTIQ) 100 MG 24 hr tablet Take 100 mg by mouth daily.   escitalopram (LEXAPRO) 10 MG tablet Take 10 mg by mouth daily.   gabapentin (NEURONTIN) 300 MG capsule Take 1 or 2 capsules by mouth TID   hydrOXYzine (ATARAX) 10 MG tablet TAKE ONE TAB AS NEEDED FOR ITCHING AT BEDTIME   ketoconazole (NIZORAL) 2 % shampoo Apply topically.   methylPREDNISolone (MEDROL DOSEPAK) 4 MG TBPK tablet As indicated on the pack.   modafinil (PROVIGIL) 200 MG tablet Take 400 mg by mouth in the morning.   mupirocin ointment  (BACTROBAN) 2 % APPLY TO AFFECTED AREA 3 TIMES A DAY FOR 7 DAYS   ondansetron (ZOFRAN-ODT) 4 MG disintegrating tablet Take 1 tablet (4 mg total) by mouth every 6 (six) hours as needed for nausea or vomiting.   oxyCODONE (OXY IR/ROXICODONE) 5 MG immediate release tablet Take 5-10 mg by mouth at bedtime as needed for moderate pain.    rosuvastatin (CRESTOR) 5 MG tablet Take 1 tablet (5 mg total) by mouth daily.   sildenafil (REVATIO) 20 MG tablet TAKE 4 - 5 TABLETS BY MOUTH DAILY AS NEEDED   sucralfate (CARAFATE) 1 g tablet Take 1 g by mouth daily.    tamsulosin (FLOMAX) 0.4 MG CAPS capsule Take 0.4 mg by mouth daily.   tiZANidine (ZANAFLEX) 4 MG tablet TAKE 1 - 2 TABLETS BY MOUTH THREE TIMES DAILY AS NEEDED   traZODone (DESYREL) 100 MG tablet Take 1 tablet (100 mg total) by mouth at bedtime. Do not take with oxycodone or muscle relaxer.   No facility-administered encounter medications on file as of 11/09/2022.     Lab Results  Component Value Date   WBC 7.1 10/24/2022   HGB 14.5 10/24/2022   HCT 41.9 10/24/2022   PLT 292 10/24/2022   GLUCOSE 90 11/09/2022   CHOL 125 11/09/2022   TRIG 90.0 11/09/2022   HDL 62.80 11/09/2022   LDLCALC 44 11/09/2022   ALT 27 11/09/2022   AST 23 11/09/2022   NA 138 11/09/2022   K 4.4 11/09/2022   CL 103 11/09/2022   CREATININE 0.92 11/09/2022   BUN 11 11/09/2022   CO2 28 11/09/2022   TSH  1.09 11/09/2022   PSA 0.53 11/02/2021   INR 1.0 09/24/2019   HGBA1C 5.6 09/23/2019       Assessment & Plan:  Thyroid nodule Assessment & Plan: Found on incidentally on CTA.  Discussed.  Schedule thyroid ultrasound to further evaluate.   Orders: -     US THYROID; Future  Hypercholesteremia Assessment & Plan: Low cholesterol diet and exercise.  Continue crestor.  Check lipid panel and liver function tests.    Orders: -     Hepatic function panel -     Lipid panel -     TSH  Primary hypertension Assessment & Plan: Continue amlodipine. Blood pressures  doing better. Follow pressures. Follow metabolic panel.    Orders: -     Basic metabolic panel  History of pancreatitis Assessment & Plan: Request to have lipase checked.   Orders: -     Lipase  Nonintractable headache, unspecified chronicity pattern, unspecified headache type Assessment & Plan: Headache as outlined. Recently evaluated for headaches and high blood pressure. Reports he hit his head after a fall off a ladder in 09/2022. Around mid September, he started having a headache - left sided in the temple region and behind his eye. Headache accompanied by blurry vision and photophobia - left eye. No jaw claudication. Evaluated in ER 10/27/22.  Ophthalmology evaluation while in ER - vision 20/20 OU, pupils reactive.  Fundus exam unremarkable and normal optic nerve appearance. CTA (head and neck) - no intracranial large vessel occlusion or other lesions. He had f/u here in the office after ER visit.  Placed on prednisone.  ESR - 25. Is feeling better.  Headache is better.  Still having pain - left side head, but has improved.  No rash.  No eye symptoms now. No dizziness or light headedness. Discussed possible etiology if headache.  Discussed no evidence of vasculitis, CTA unrevealing and ESR ok.  Will hold on additional medication given improving symptoms.  Complete prednisone.  Neurology evaluation for further evaluation and treatment.    Chronic low back pain, unspecified back pain laterality, unspecified whether sciatica present Assessment & Plan: Followed by pain clinic/[facility].  Stable. Seeing Dr Lynn Ito - f/u low back pain and neck pain.  Pain fairly controlled with medications, exercise and trigger point injections.  Receives trigger point injections and recommended to continue gabapentin and tizanidine.   Depression, major, single episode, mild [facility]) Assessment & Plan: Followed by Dr Evelene Croon.  Stable. Follow.    Rheumatoid arthritis, involving unspecified site, unspecified whether  rheumatoid factor present [facility]) Assessment & Plan: Being followed by rheumatology.  On humira.  Stable.    Elevated alkaline phosphatase level Assessment & Plan: He has a persistent mild alk phos abnormality with no significant transaminitis. He has a very significant medical history and surgical history for pancreatitis and recurrent pancreatic interventions. His FibroScan score on 03/2020 has been low and reassuring and work-up for biliary disease activity both by serologies entheses antimitochondrial antibody - March 2023) and radiologically has been negative. He has been followed by GI - [facility] - and was recommended to repeat his transient elastography score every 3 years so long as alk phos remains abnormal. He just had the liver enzymes performed recently and now has a normal ALK phos.  She recommended have him return in 1 year.  With the recently normal alkaline phosphatase, Dr Colin Broach recommended to follow up with fibroscan and alk phos. Discussed with him today.  He is interested in getting his GI care locally.  Has seen [facility] for his colonoscopy.  Would like to have them follow his liver as well.  Will schedule f/u with GI.    Anemia, unspecified type Assessment & Plan: Previous admission for bloody/black stool.  W/up as outlined in previous note.  Has had recent colonoscopy 03/2021. No further bleeding.  Request to have GI care locally.  Has seen [facility].  Arrange f/u as outlined.    Aortic dilatation [facility]) Assessment & Plan: Aortic root ectasia - 4cm (CT chest - [facility] 03/31/2022).  Followed by cardiology.       Dale , MD

## 2022-11-09 NOTE — Assessment & Plan Note (Signed)
Low cholesterol diet and exercise.  Continue crestor.  Check lipid panel and liver function tests.   

## 2022-11-09 NOTE — Assessment & Plan Note (Addendum)
Followed by Dr Evelene Croon.  Stable. Follow.

## 2022-11-09 NOTE — Assessment & Plan Note (Signed)
Found on incidentally on CTA.  Discussed.  Schedule thyroid ultrasound to further evaluate.

## 2022-11-09 NOTE — Assessment & Plan Note (Signed)
Request to have lipase checked.

## 2022-11-09 NOTE — Assessment & Plan Note (Signed)
Continue amlodipine.  Blood pressures doing better.  Follow pressures.  Follow metabolic panel.

## 2022-11-17 ENCOUNTER — Ambulatory Visit
Admission: RE | Admit: 2022-11-17 | Discharge: 2022-11-17 | Disposition: A | Discharge status: Home / Self Care | Payer: Medicare HMO | Source: Ambulatory Visit | Attending: Internal Medicine | Admitting: Internal Medicine

## 2022-11-17 DIAGNOSIS — E041 Nontoxic single thyroid nodule: Secondary | ICD-10-CM | POA: Diagnosis not present

## 2022-11-17 DIAGNOSIS — Z0389 Encounter for observation for other suspected diseases and conditions ruled out: Secondary | ICD-10-CM | POA: Diagnosis not present

## 2022-11-28 DIAGNOSIS — R519 Headache, unspecified: Secondary | ICD-10-CM | POA: Diagnosis not present

## 2022-12-06 DIAGNOSIS — M79641 Pain in right hand: Secondary | ICD-10-CM | POA: Diagnosis not present

## 2022-12-06 DIAGNOSIS — M7989 Other specified soft tissue disorders: Secondary | ICD-10-CM | POA: Diagnosis not present

## 2022-12-06 DIAGNOSIS — S61451A Open bite of right hand, initial encounter: Secondary | ICD-10-CM | POA: Diagnosis not present

## 2022-12-07 DIAGNOSIS — M47816 Spondylosis without myelopathy or radiculopathy, lumbar region: Secondary | ICD-10-CM | POA: Diagnosis not present

## 2022-12-07 DIAGNOSIS — M069 Rheumatoid arthritis, unspecified: Secondary | ICD-10-CM | POA: Diagnosis not present

## 2022-12-07 DIAGNOSIS — M19012 Primary osteoarthritis, left shoulder: Secondary | ICD-10-CM | POA: Diagnosis not present

## 2022-12-07 DIAGNOSIS — M7918 Myalgia, other site: Secondary | ICD-10-CM | POA: Diagnosis not present

## 2022-12-07 DIAGNOSIS — G894 Chronic pain syndrome: Secondary | ICD-10-CM | POA: Diagnosis not present

## 2022-12-07 DIAGNOSIS — M5030 Other cervical disc degeneration, unspecified cervical region: Secondary | ICD-10-CM | POA: Diagnosis not present

## 2022-12-07 DIAGNOSIS — M19011 Primary osteoarthritis, right shoulder: Secondary | ICD-10-CM | POA: Diagnosis not present

## 2022-12-07 DIAGNOSIS — M5136 Other intervertebral disc degeneration, lumbar region with discogenic back pain only: Secondary | ICD-10-CM | POA: Diagnosis not present

## 2022-12-07 DIAGNOSIS — Z79899 Other long term (current) drug therapy: Secondary | ICD-10-CM | POA: Diagnosis not present

## 2022-12-07 DIAGNOSIS — J90 Pleural effusion, not elsewhere classified: Secondary | ICD-10-CM | POA: Diagnosis not present

## 2022-12-07 DIAGNOSIS — J811 Chronic pulmonary edema: Secondary | ICD-10-CM | POA: Diagnosis not present

## 2022-12-07 DIAGNOSIS — M170 Bilateral primary osteoarthritis of knee: Secondary | ICD-10-CM | POA: Diagnosis not present

## 2022-12-07 DIAGNOSIS — M19022 Primary osteoarthritis, left elbow: Secondary | ICD-10-CM | POA: Diagnosis not present

## 2022-12-07 DIAGNOSIS — Z981 Arthrodesis status: Secondary | ICD-10-CM | POA: Diagnosis not present

## 2022-12-07 DIAGNOSIS — G8929 Other chronic pain: Secondary | ICD-10-CM | POA: Diagnosis not present

## 2022-12-09 DIAGNOSIS — Z203 Contact with and (suspected) exposure to rabies: Secondary | ICD-10-CM | POA: Diagnosis not present

## 2022-12-09 DIAGNOSIS — Z2914 Encounter for prophylactic rabies immune globin: Secondary | ICD-10-CM | POA: Diagnosis not present

## 2022-12-16 DIAGNOSIS — Z203 Contact with and (suspected) exposure to rabies: Secondary | ICD-10-CM | POA: Diagnosis not present

## 2022-12-16 DIAGNOSIS — Z2914 Encounter for prophylactic rabies immune globin: Secondary | ICD-10-CM | POA: Diagnosis not present

## 2022-12-16 DIAGNOSIS — Z23 Encounter for immunization: Secondary | ICD-10-CM | POA: Diagnosis not present

## 2022-12-22 DIAGNOSIS — Z2914 Encounter for prophylactic rabies immune globin: Secondary | ICD-10-CM | POA: Diagnosis not present

## 2022-12-22 DIAGNOSIS — Z203 Contact with and (suspected) exposure to rabies: Secondary | ICD-10-CM | POA: Diagnosis not present

## 2022-12-26 DIAGNOSIS — G8929 Other chronic pain: Secondary | ICD-10-CM | POA: Diagnosis not present

## 2022-12-26 DIAGNOSIS — R4189 Other symptoms and signs involving cognitive functions and awareness: Secondary | ICD-10-CM | POA: Diagnosis not present

## 2022-12-26 DIAGNOSIS — H53149 Visual discomfort, unspecified: Secondary | ICD-10-CM | POA: Diagnosis not present

## 2022-12-26 DIAGNOSIS — R519 Headache, unspecified: Secondary | ICD-10-CM | POA: Diagnosis not present

## 2022-12-26 DIAGNOSIS — M545 Low back pain, unspecified: Secondary | ICD-10-CM | POA: Diagnosis not present

## 2023-01-02 DIAGNOSIS — M19022 Primary osteoarthritis, left elbow: Secondary | ICD-10-CM | POA: Diagnosis not present

## 2023-01-02 DIAGNOSIS — Z79899 Other long term (current) drug therapy: Secondary | ICD-10-CM | POA: Diagnosis not present

## 2023-01-02 DIAGNOSIS — M5030 Other cervical disc degeneration, unspecified cervical region: Secondary | ICD-10-CM | POA: Diagnosis not present

## 2023-01-02 DIAGNOSIS — M7918 Myalgia, other site: Secondary | ICD-10-CM | POA: Diagnosis not present

## 2023-01-02 DIAGNOSIS — M19011 Primary osteoarthritis, right shoulder: Secondary | ICD-10-CM | POA: Diagnosis not present

## 2023-01-02 DIAGNOSIS — M79602 Pain in left arm: Secondary | ICD-10-CM | POA: Diagnosis not present

## 2023-01-02 DIAGNOSIS — M19012 Primary osteoarthritis, left shoulder: Secondary | ICD-10-CM | POA: Diagnosis not present

## 2023-01-02 DIAGNOSIS — K861 Other chronic pancreatitis: Secondary | ICD-10-CM | POA: Diagnosis not present

## 2023-01-02 DIAGNOSIS — G894 Chronic pain syndrome: Secondary | ICD-10-CM | POA: Diagnosis not present

## 2023-01-02 DIAGNOSIS — M51369 Other intervertebral disc degeneration, lumbar region without mention of lumbar back pain or lower extremity pain: Secondary | ICD-10-CM | POA: Diagnosis not present

## 2023-01-02 DIAGNOSIS — J811 Chronic pulmonary edema: Secondary | ICD-10-CM | POA: Diagnosis not present

## 2023-01-02 DIAGNOSIS — M069 Rheumatoid arthritis, unspecified: Secondary | ICD-10-CM | POA: Diagnosis not present

## 2023-01-02 DIAGNOSIS — M47816 Spondylosis without myelopathy or radiculopathy, lumbar region: Secondary | ICD-10-CM | POA: Diagnosis not present

## 2023-01-02 DIAGNOSIS — Z981 Arthrodesis status: Secondary | ICD-10-CM | POA: Diagnosis not present

## 2023-01-02 DIAGNOSIS — M170 Bilateral primary osteoarthritis of knee: Secondary | ICD-10-CM | POA: Diagnosis not present

## 2023-01-02 DIAGNOSIS — J90 Pleural effusion, not elsewhere classified: Secondary | ICD-10-CM | POA: Diagnosis not present

## 2023-01-03 ENCOUNTER — Telehealth (INDEPENDENT_AMBULATORY_CARE_PROVIDER_SITE_OTHER): Payer: Medicare HMO | Admitting: Family Medicine

## 2023-01-03 ENCOUNTER — Telehealth: Payer: Self-pay

## 2023-01-03 ENCOUNTER — Encounter: Payer: Self-pay | Admitting: Internal Medicine

## 2023-01-03 ENCOUNTER — Encounter: Payer: Self-pay | Admitting: Family Medicine

## 2023-01-03 VITALS — BP 157/77 | HR 93 | Temp 101.0°F | Wt 204.0 lb

## 2023-01-03 DIAGNOSIS — R0981 Nasal congestion: Secondary | ICD-10-CM | POA: Diagnosis not present

## 2023-01-03 DIAGNOSIS — R051 Acute cough: Secondary | ICD-10-CM

## 2023-01-03 MED ORDER — BENZONATATE 100 MG PO CAPS: ORAL_CAPSULE | ORAL | 0 refills | Status: DC

## 2023-01-03 MED ORDER — DOXYCYCLINE HYCLATE 100 MG PO TABS: 100.0000 mg | ORAL_TABLET | Freq: Two times a day (BID) | ORAL | 0 refills | Status: DC

## 2023-01-03 NOTE — Telephone Encounter (Signed)
Patient has virtual visit scheduled today to be evaluated./

## 2023-01-03 NOTE — Patient Instructions (Signed)
-  I sent the medication(s) we discussed to your pharmacy: Meds ordered this encounter  Medications   doxycycline (VIBRA-TABS) 100 MG tablet    Sig: Take 1 tablet (100 mg total) by mouth 2 (two) times daily.    Dispense:  20 tablet    Refill:  0   benzonatate (TESSALON PERLES) 100 MG capsule    Sig: 1-2 capsules up to twice daily as needed for cough    Dispense:  30 capsule    Refill:  0     I hope you are feeling better soon!  Seek in person care promptly if your symptoms worsen, new concerns arise or you are not improving with treatment.  It was nice to meet you today. I help [facility] out with telemedicine visits on Tuesdays and Thursdays and am happy to help if you need a virtual follow up visit on those days. Otherwise, if you have any concerns or questions following this visit please schedule a follow up visit with your Primary Care office or seek care at a local urgent care clinic to avoid delays in care. If you are having severe or life threatening symptoms please call 911 and/or go to the nearest emergency room.   

## 2023-01-03 NOTE — Progress Notes (Deleted)
a 

## 2023-01-03 NOTE — Telephone Encounter (Signed)
Patient states he is following-up on his MyChart message.  Patient states sinus drainage coming down his throat but it has not moved to his lungs yet.  Patient states he took two covid home tests and was negative.  Patient states he does not want to go to the ED.  Patient states his blood pressure is 151/77 and pulse is 93, weight is 204, temperature is 101.  I offered to let patient speak Access Nurse, but he states he will wait for his appointment with Kriste Basque, DO, at 6:20pm today.

## 2023-01-03 NOTE — Progress Notes (Signed)
Virtual Visit via Video Note  I connected with [facility]  on 01/03/23 at  6:20 PM EST by a video enabled telemedicine application and verified that I am speaking with the correct person using two identifiers.  Location patient: [place] Location provider:work or home office Persons participating in the virtual visit: patient, provider  I discussed the limitations and requested verbal permission for telemedicine visit. The patient expressed understanding and agreed to proceed.   HPI:  Acute telemedicine visit for cough and congestion: -Onset: 3 days ago -Symptoms include: nasal congestion, sinus pressure and discomfort, cough, sore throat initially, low grade fever (high of 101), a little laryngitis, had some body aches -reports has had sinusitis in the past -reports tolerates doxycycline well -temp 99.6 tonight -no known sick contacts -Denies:SOB, CP, nausea, vomiting, diarrhea -took covid tests x3 negative -Has tried: nyquil -Pertinent past medical history: see below - on humira -Pertinent medication allergies: Allergies  Allergen Reactions   Corticosteroids Other (See Comments)    Pancreatitis   Penicillins Anaphylaxis    Has patient had a PCN reaction causing immediate rash, facial/tongue/throat swelling, SOB or lightheadedness with hypotension: no Has patient had a PCN reaction causing severe rash involving mucus membranes or skin necrosis: yes Has patient had a PCN reaction that required hospitalization: yes Has patient had a PCN reaction occurring within the last 10 years: no If all of the above answers are "NO", then may proceed with Cephalosporin use.    Sulfa Antibiotics Other (See Comments)    Unknown reaction   Sulfasalazine    Plaquenil [Hydroxychloroquine] Rash   Protonix [Pantoprazole] Rash   -COVID-19 vaccine status:  Immunization History  Administered Date(s) Administered   Hep A / Hep B 01/11/2020, 04/21/2020, 05/10/2021, 06/03/2021, 11/11/2021   Influenza Inj  Mdck Quad Pf 10/07/2017, 10/12/2021   Influenza, Mdck, Trivalent,PF 6+ MOS(egg free) 09/28/2022   Influenza,inj,Quad PF,6+ Mos 10/15/2014, 10/06/2015, 10/11/2018   Influenza-Unspecified 11/14/2013, 09/28/2016, 10/01/2019, 10/15/2020   Moderna Covid-19 Vaccine Bivalent Booster 62yrs & up 10/15/2020, 06/03/2021   Moderna Sars-Covid-2 Vaccination 04/22/2019, 05/20/2019, 12/16/2019, 05/01/2020   PNEUMOCOCCAL CONJUGATE-20 09/07/2020, 10/20/2022   Pfizer(Comirnaty)Fall Seasonal Vaccine 12 years and older 11/01/2021   Pneumococcal Polysaccharide-23 11/01/2021   Rsv, Bivalent, Protein Subunit Rsvpref,pf Verdis Frederickson) 10/20/2022   Tdap 06/12/2014, 05/10/2021, 11/03/2022   Unspecified SARS-COV-2 Vaccination 09/28/2022   Zoster Recombinant(Shingrix) 06/02/2016, 09/15/2016     ROS: See pertinent positives and negatives per HPI.  Past Medical History:  Diagnosis Date   Allergy    Anoxic brain injury [facility])    Arthritis    Awareness under anesthesia    need a lot to put me under   Chronic back pain    s/p 5 back surgeries   Coronary artery disease    Cardiac catheterization in 2007 at [facility] showed 30% stenosis in proximal LAD. No other disease. Ejection fraction was 65%   Depression    H/O acute pancreatitis    Hypercholesterolemia    Hypertension    Mild cognitive impairment with memory loss    Pancreatitis     Past Surgical History:  Procedure Laterality Date   abdomial surgery     BACK SURGERY     multiple back surgeries (5)   CARDIAC CATHETERIZATION     [facility]    CHOLECYSTECTOMY  2013   COLONOSCOPY N/A 03/05/2021   Procedure: COLONOSCOPY;  Surgeon: Jaynie Collins, DO;  Location: [facility] ENDOSCOPY;  Service: Gastroenterology;  Laterality: N/A;  IDDM   COLONOSCOPY WITH PROPOFOL N/A 06/30/2015  Procedure: COLONOSCOPY WITH PROPOFOL;  Surgeon: Christena Deem, MD;  Location: [facility] ENDOSCOPY;  Service: Endoscopy;  Laterality: N/A;   ESOPHAGOGASTRODUODENOSCOPY (EGD) WITH PROPOFOL N/A  03/30/2016   Procedure: ESOPHAGOGASTRODUODENOSCOPY (EGD) WITH PROPOFOL;  Surgeon: Scot Jun, MD;  Location[facility] ENDOSCOPY;  Service: Endoscopy;  Laterality: N/A;   FUNCTIONAL ENDOSCOPIC SINUS SURGERY     JOINT REPLACEMENT     LAMINECTOMY     LUMBAR SPINE SURGERY     SHOULDER ARTHROSCOPY WITH ROTATOR CUFF REPAIR AND OPEN BICEPS TENODESIS Right    SHOULDER ARTHROSCOPY WITH ROTATOR CUFF REPAIR AND OPEN BICEPS TENODESIS     SPINE SURGERY     TRIGGER POINT INJECTION     VASECTOMY     With Reversal   VEIN SURGERY Right 2013   arm     Current Outpatient Medications:    amLODipine (NORVASC) 5 MG tablet, Take 1 tablet (5 mg total) by mouth 2 (two) times daily., Disp: 180 tablet, Rfl: 1   atomoxetine (STRATTERA) 100 MG capsule, Take 100 mg by mouth daily., Disp: , Rfl:    benzonatate (TESSALON PERLES) 100 MG capsule, 1-2 capsules up to twice daily as needed for cough., Disp: 30 capsule, Rfl: 0   desvenlafaxine (PRISTIQ) 100 MG 24 hr tablet, Take 100 mg by mouth daily., Disp: , Rfl:    doxycycline (VIBRA-TABS) 100 MG tablet, Take 1 tablet (100 mg total) by mouth 2 (two) times daily., Disp: 20 tablet, Rfl: 0   escitalopram (LEXAPRO) 10 MG tablet, Take 10 mg by mouth daily., Disp: , Rfl:    gabapentin (NEURONTIN) 300 MG capsule, Take 1 or 2 capsules by mouth TID, Disp: , Rfl:    hydrOXYzine (ATARAX) 10 MG tablet, TAKE ONE TAB AS NEEDED FOR ITCHING AT BEDTIME, Disp: , Rfl:    ketoconazole (NIZORAL) 2 % shampoo, Apply topically., Disp: , Rfl:    modafinil (PROVIGIL) 200 MG tablet, Take 400 mg by mouth in the morning., Disp: , Rfl:    ondansetron (ZOFRAN-ODT) 4 MG disintegrating tablet, Take 1 tablet (4 mg total) by mouth every 6 (six) hours as needed for nausea or vomiting., Disp: 20 tablet, Rfl: 0   oxyCODONE (OXY IR/ROXICODONE) 5 MG immediate release tablet, Take 5-10 mg by mouth at bedtime as needed for moderate pain. , Disp: , Rfl: 0   rosuvastatin (CRESTOR) 5 MG tablet, Take 1 tablet (5 mg  total) by mouth daily., Disp: 90 tablet, Rfl: 3   sildenafil (REVATIO) 20 MG tablet, TAKE 4 - 5 TABLETS BY MOUTH DAILY AS NEEDED, Disp: , Rfl: 3   sucralfate (CARAFATE) 1 g tablet, Take 1 g by mouth daily. , Disp: , Rfl: 1   tamsulosin (FLOMAX) 0.4 MG CAPS capsule, Take 0.4 mg by mouth daily., Disp: , Rfl:    tiZANidine (ZANAFLEX) 4 MG tablet, TAKE 1 - 2 TABLETS BY MOUTH THREE TIMES DAILY AS NEEDED, Disp: , Rfl:    traZODone (DESYREL) 100 MG tablet, Take 1 tablet (100 mg total) by mouth at bedtime. Do not take with oxycodone or muscle relaxer., Disp: 90 tablet, Rfl: 1   mupirocin ointment (BACTROBAN) 2 %, APPLY TO AFFECTED AREA 3 TIMES A DAY FOR 7 DAYS (Patient not taking: Reported on 01/03/2023), Disp: , Rfl:   EXAM:  VITALS per patient if applicable:  GENERAL: alert, oriented, appears well and in no acute distress  HEENT: atraumatic, conjunttiva clear, no obvious abnormalities on inspection of external nose and ears  NECK: normal movements of the head and  neck  LUNGS: on inspection no signs of respiratory distress, breathing rate appears normal, no obvious gross SOB, gasping or wheezing  CV: no obvious cyanosis  MS: moves all visible extremities without noticeable abnormality  PSYCH/NEURO: pleasant and cooperative, no obvious depression or anxiety, speech and thought processing grossly intact  ASSESSMENT AND PLAN:  Discussed the following assessment and plan:  Acute cough  Nasal congestion  -we discussed possible serious and likely etiologies, options for evaluation and workup, limitations of telemedicine visit vs in person visit, treatment, treatment risks and precautions. Pt is agreeable to treatment via telemedicine at this moment. Query VURI, influenza vs sinusitis vs other. Discussed risc benefit tamiflu - he preferred to not take. He did want to start abx in case of bacterial resp illness if any worsening or if symptoms not improving over the next few days. Sent doxy 100mg   bid x7-10 days and tessalon rx for cough. . Advised to seek prompt in person care if worsening, new symptoms arise, or if is not improving with treatment as expected per our conversation of expected course. Discussed options for follow up care. Did let this patient know that I do telemedicine on Tuesdays and Thursdays for [place] and those are the days I am logged into the system. Advised to schedule follow up visit with PCP, [place] virtual visits or [facility] if any further questions or concerns to avoid delays in care.   I discussed the assessment and treatment plan with the patient. The patient was provided an opportunity to ask questions and all were answered. The patient agreed with the plan and demonstrated an understanding of the instructions.     Terressa Koyanagi, DO

## 2023-01-05 ENCOUNTER — Telehealth: Payer: Medicare HMO | Admitting: Family Medicine

## 2023-01-06 NOTE — Telephone Encounter (Signed)
Error

## 2023-01-25 ENCOUNTER — Other Ambulatory Visit: Payer: Self-pay | Admitting: Internal Medicine

## 2023-02-06 DIAGNOSIS — M19022 Primary osteoarthritis, left elbow: Secondary | ICD-10-CM | POA: Diagnosis not present

## 2023-02-06 DIAGNOSIS — M19011 Primary osteoarthritis, right shoulder: Secondary | ICD-10-CM | POA: Diagnosis not present

## 2023-02-06 DIAGNOSIS — M19012 Primary osteoarthritis, left shoulder: Secondary | ICD-10-CM | POA: Diagnosis not present

## 2023-02-06 DIAGNOSIS — M170 Bilateral primary osteoarthritis of knee: Secondary | ICD-10-CM | POA: Diagnosis not present

## 2023-02-06 DIAGNOSIS — J90 Pleural effusion, not elsewhere classified: Secondary | ICD-10-CM | POA: Diagnosis not present

## 2023-02-06 DIAGNOSIS — J811 Chronic pulmonary edema: Secondary | ICD-10-CM | POA: Diagnosis not present

## 2023-02-06 DIAGNOSIS — M7918 Myalgia, other site: Secondary | ICD-10-CM | POA: Diagnosis not present

## 2023-02-06 DIAGNOSIS — M069 Rheumatoid arthritis, unspecified: Secondary | ICD-10-CM | POA: Diagnosis not present

## 2023-02-06 DIAGNOSIS — M5030 Other cervical disc degeneration, unspecified cervical region: Secondary | ICD-10-CM | POA: Diagnosis not present

## 2023-02-06 DIAGNOSIS — K861 Other chronic pancreatitis: Secondary | ICD-10-CM | POA: Diagnosis not present

## 2023-02-06 DIAGNOSIS — M5136 Other intervertebral disc degeneration, lumbar region with discogenic back pain only: Secondary | ICD-10-CM | POA: Diagnosis not present

## 2023-02-06 DIAGNOSIS — G894 Chronic pain syndrome: Secondary | ICD-10-CM | POA: Diagnosis not present

## 2023-02-06 DIAGNOSIS — M47816 Spondylosis without myelopathy or radiculopathy, lumbar region: Secondary | ICD-10-CM | POA: Diagnosis not present

## 2023-02-06 DIAGNOSIS — Z981 Arthrodesis status: Secondary | ICD-10-CM | POA: Diagnosis not present

## 2023-02-06 DIAGNOSIS — R202 Paresthesia of skin: Secondary | ICD-10-CM | POA: Diagnosis not present

## 2023-02-06 DIAGNOSIS — Z79899 Other long term (current) drug therapy: Secondary | ICD-10-CM | POA: Diagnosis not present

## 2023-02-13 DIAGNOSIS — R4189 Other symptoms and signs involving cognitive functions and awareness: Secondary | ICD-10-CM | POA: Diagnosis not present

## 2023-02-13 DIAGNOSIS — R519 Headache, unspecified: Secondary | ICD-10-CM | POA: Diagnosis not present

## 2023-02-13 DIAGNOSIS — H53149 Visual discomfort, unspecified: Secondary | ICD-10-CM | POA: Diagnosis not present

## 2023-02-13 DIAGNOSIS — M545 Low back pain, unspecified: Secondary | ICD-10-CM | POA: Diagnosis not present

## 2023-02-13 DIAGNOSIS — R202 Paresthesia of skin: Secondary | ICD-10-CM | POA: Diagnosis not present

## 2023-02-15 ENCOUNTER — Other Ambulatory Visit: Payer: Self-pay | Admitting: Neurology

## 2023-02-15 DIAGNOSIS — R55 Syncope and collapse: Secondary | ICD-10-CM | POA: Diagnosis not present

## 2023-02-15 DIAGNOSIS — Z9889 Other specified postprocedural states: Secondary | ICD-10-CM | POA: Diagnosis not present

## 2023-02-15 DIAGNOSIS — R002 Palpitations: Secondary | ICD-10-CM | POA: Diagnosis not present

## 2023-02-15 DIAGNOSIS — R000 Tachycardia, unspecified: Secondary | ICD-10-CM | POA: Diagnosis not present

## 2023-02-15 DIAGNOSIS — R29818 Other symptoms and signs involving the nervous system: Secondary | ICD-10-CM

## 2023-02-15 DIAGNOSIS — R079 Chest pain, unspecified: Secondary | ICD-10-CM | POA: Diagnosis not present

## 2023-02-15 DIAGNOSIS — E7800 Pure hypercholesterolemia, unspecified: Secondary | ICD-10-CM | POA: Diagnosis not present

## 2023-02-15 DIAGNOSIS — R0602 Shortness of breath: Secondary | ICD-10-CM | POA: Diagnosis not present

## 2023-02-15 DIAGNOSIS — R200 Anesthesia of skin: Secondary | ICD-10-CM

## 2023-02-15 DIAGNOSIS — M5412 Radiculopathy, cervical region: Secondary | ICD-10-CM

## 2023-02-22 ENCOUNTER — Encounter: Payer: Self-pay | Admitting: Neurology

## 2023-02-25 ENCOUNTER — Ambulatory Visit
Admission: RE | Admit: 2023-02-25 | Discharge: 2023-02-25 | Disposition: A | Discharge status: Home / Self Care | Payer: Medicare HMO | Source: Ambulatory Visit | Attending: Neurology | Admitting: Neurology

## 2023-02-25 DIAGNOSIS — R200 Anesthesia of skin: Secondary | ICD-10-CM

## 2023-02-25 DIAGNOSIS — M47812 Spondylosis without myelopathy or radiculopathy, cervical region: Secondary | ICD-10-CM | POA: Diagnosis not present

## 2023-02-25 DIAGNOSIS — M4802 Spinal stenosis, cervical region: Secondary | ICD-10-CM | POA: Diagnosis not present

## 2023-02-25 DIAGNOSIS — M5412 Radiculopathy, cervical region: Secondary | ICD-10-CM

## 2023-02-25 DIAGNOSIS — R29818 Other symptoms and signs involving the nervous system: Secondary | ICD-10-CM

## 2023-03-06 DIAGNOSIS — G8929 Other chronic pain: Secondary | ICD-10-CM | POA: Diagnosis not present

## 2023-03-06 DIAGNOSIS — M19012 Primary osteoarthritis, left shoulder: Secondary | ICD-10-CM | POA: Diagnosis not present

## 2023-03-06 DIAGNOSIS — K861 Other chronic pancreatitis: Secondary | ICD-10-CM | POA: Diagnosis not present

## 2023-03-06 DIAGNOSIS — M19011 Primary osteoarthritis, right shoulder: Secondary | ICD-10-CM | POA: Diagnosis not present

## 2023-03-06 DIAGNOSIS — M5136 Other intervertebral disc degeneration, lumbar region with discogenic back pain only: Secondary | ICD-10-CM | POA: Diagnosis not present

## 2023-03-06 DIAGNOSIS — M5030 Other cervical disc degeneration, unspecified cervical region: Secondary | ICD-10-CM | POA: Diagnosis not present

## 2023-03-06 DIAGNOSIS — M7918 Myalgia, other site: Secondary | ICD-10-CM | POA: Diagnosis not present

## 2023-03-06 DIAGNOSIS — M170 Bilateral primary osteoarthritis of knee: Secondary | ICD-10-CM | POA: Diagnosis not present

## 2023-03-06 DIAGNOSIS — G894 Chronic pain syndrome: Secondary | ICD-10-CM | POA: Diagnosis not present

## 2023-03-06 DIAGNOSIS — M19022 Primary osteoarthritis, left elbow: Secondary | ICD-10-CM | POA: Diagnosis not present

## 2023-03-06 DIAGNOSIS — R519 Headache, unspecified: Secondary | ICD-10-CM | POA: Diagnosis not present

## 2023-03-07 DIAGNOSIS — R0602 Shortness of breath: Secondary | ICD-10-CM | POA: Diagnosis not present

## 2023-03-07 DIAGNOSIS — R0789 Other chest pain: Secondary | ICD-10-CM | POA: Diagnosis not present

## 2023-03-09 DIAGNOSIS — R002 Palpitations: Secondary | ICD-10-CM | POA: Diagnosis not present

## 2023-03-10 ENCOUNTER — Other Ambulatory Visit: Payer: Self-pay | Admitting: Internal Medicine

## 2023-03-14 ENCOUNTER — Ambulatory Visit (INDEPENDENT_AMBULATORY_CARE_PROVIDER_SITE_OTHER): Payer: Medicare HMO | Admitting: Internal Medicine

## 2023-03-14 ENCOUNTER — Encounter: Payer: Self-pay | Admitting: Internal Medicine

## 2023-03-14 DIAGNOSIS — R519 Headache, unspecified: Secondary | ICD-10-CM

## 2023-03-14 DIAGNOSIS — M069 Rheumatoid arthritis, unspecified: Secondary | ICD-10-CM

## 2023-03-14 DIAGNOSIS — E7800 Pure hypercholesterolemia, unspecified: Secondary | ICD-10-CM

## 2023-03-14 DIAGNOSIS — Z8719 Personal history of other diseases of the digestive system: Secondary | ICD-10-CM

## 2023-03-14 NOTE — Progress Notes (Deleted)
Subjective:    Patient ID: [name], male    DOB: Aug 13, 1960, 63 y.o.   MRN: [id number]  Patient here for No chief complaint on file.   HPI Here for a scheduled follow up - follow up regarding hypertension and hypercholesterolemia. Seeing neurology for persistent headaches. Overall improved. Evaluated 02/13/23 - reported tingling finger tips and radiating to right shoulder/neck. Taking nortriptyline. Recommended MRI c-spine. Gabapentin changed to lyrica. Referred to [facility] - question of osteonecrosis of lunate. Saw cardiology 02/15/23 - worsening exertional dyspnea and heart racing. Recommended 72 hour holter and stress echo. Stress test - no evidence of scar or ischemia. Holter - Sinus rhythm with mean heart rate 83 bpm, range 55 to 150 bpm. Occasional premature atrial contractions. 4 brief runs of SVT longest lasting 4 beats. Rare premature ventricular contraction    Past Medical History:  Diagnosis Date   Allergy    Anoxic brain injury (HCC)    Arthritis    Awareness under anesthesia    need a lot to put me under   Chronic back pain    s/p 5 back surgeries   Coronary artery disease    Cardiac catheter[facility] in 2007 at [facility] showed 30% stenosis in proximal LAD. No other disease. Ejection fraction was 65%   Depression    H/O acute pancreatitis    Hypercholesterolemia    Hypertension    Mild cognitive impairment with memory loss    Pancreatitis    Past Surgical History:  Procedure Laterality Date   abdomial surgery     BACK SURGERY     multiple back surgeries (5)   CARDIAC CATHETERIZATION     [facility]    CHOLECYSTECTOMY  2013   COLONOSCOPY N/A 03/05/2021   Procedure: COLONOSCOPY;  Surgeon: Jaynie Collins, DO;  Location: [facility] ENDOSCOPY;  Service: Gastroenterology;  Laterality: N/A;  IDDM   COLONOSCOPY WITH PROPOFOL N/A 06/30/2015   Procedure: COLONOSCOPY WITH PROPOFOL;  Surgeon: Christena Deem, MD;  Location: [facility] ENDOSCOPY;  Service: Endoscopy;  Laterality: N/A;    ESOPHAGOGASTRODUODENOSCOPY (EGD) WITH PROPOFOL N/A 03/30/2016   Procedure: ESOPHAGOGASTRODUODENOSCOPY (EGD) WITH PROPOFOL;  Surgeon: Scot Jun, MD;  Location: [facility] ENDOSCOPY;  Service: Endoscopy;  Laterality: N/A;   FUNCTIONAL ENDOSCOPIC SINUS SURGERY     JOINT REPLACEMENT     LAMINECTOMY     LUMBAR SPINE SURGERY     SHOULDER ARTHROSCOPY WITH ROTATOR CUFF REPAIR AND OPEN BICEPS TENODESIS Right    SHOULDER ARTHROSCOPY WITH ROTATOR CUFF REPAIR AND OPEN BICEPS TENODESIS     SPINE SURGERY     TRIGGER POINT INJECTION     VASECTOMY     With Reversal   VEIN SURGERY Right 2013   arm   Family History  Problem Relation Age of Onset   Heart disease Father    Heart attack Father    Heart disease Mother    Heart attack Mother    Heart attack Brother        MI's x 2    Hypertension Other    Colon cancer Other    Hypertension Sister    Heart attack Paternal Grandfather    Hypertension Sister    Social History   Socioeconomic History   Marital status: Divorced    Spouse name: Not on file   Number of children: 3   Years of education: Not on file   Highest education level: Not on file  Occupational History   Not on file  Tobacco Use  Smoking status: Never   Smokeless tobacco: Never  Vaping Use   Vaping status: Never Used  Substance and Sexual Activity   Alcohol use: No    Alcohol/week: 0.0 standard drinks of alcohol   Drug use: No   Sexual activity: Yes  Other Topics Concern   Not on file  Social History Narrative   Not on file   Social Drivers of Health   Financial Resource Strain: Low Risk  (12/29/2020)   Overall Financial Resource Strain (CARDIA)    Difficulty of Paying Living Expenses: Not hard at all  Food Insecurity: No Food Insecurity (03/19/2021)   Received from [facility]   Hunger Vital Sign  Transportation Needs: No Transportation Needs (03/19/2021)   Received from [facility]   PRAPARE - Transportation  Physical  Activity: Not on file  Stress: No Stress Concern Present (12/29/2020)   [employer] of Occupational Health - Occupational Stress Questionnaire    Feeling of Stress : Not at all  Social Connections: Unknown (12/29/2020)   Social Connection and Isolation Panel [NHANES]    Frequency of Communication with Friends and Family: More than three times a week    Frequency of Social Gatherings with Friends and Family: More than three times a week    Attends Religious Services: Not on file    Active Member of Clubs or Organizations: Not on file    Attends Banker Meetings: Not on file    Marital Status: Not on file     Review of Systems     Objective:     There were no vitals taken for this visit. Wt Readings from Last 3 Encounters:  01/03/23 204 lb (92.5 kg)  11/09/22 199 lb (90.3 kg)  11/03/22 196 lb (88.9 kg)    Physical Exam  {Perform Simple Foot Exam  Perform Detailed exam:1} {Insert foot Exam (Optional):30965}   Outpatient Encounter Medications as of 03/14/2023  Medication Sig   amLODipine (NORVASC) 5 MG tablet TAKE 1 TABLET BY MOUTH TWICE A DAY   atomoxetine (STRATTERA) 100 MG capsule Take 100 mg by mouth daily.   benzonatate (TESSALON PERLES) 100 MG capsule 1-2 capsules up to twice daily as needed for cough.   desvenlafaxine (PRISTIQ) 100 MG 24 hr tablet Take 100 mg by mouth daily.   doxycycline (VIBRA-TABS) 100 MG tablet Take 1 tablet (100 mg total) by mouth 2 (two) times daily.   escitalopram (LEXAPRO) 10 MG tablet Take 10 mg by mouth daily.   gabapentin (NEURONTIN) 300 MG capsule Take 1 or 2 capsules by mouth TID   hydrOXYzine (ATARAX) 10 MG tablet TAKE ONE TAB AS NEEDED FOR ITCHING AT BEDTIME   ketoconazole (NIZORAL) 2 % shampoo Apply topically.   modafinil (PROVIGIL) 200 MG tablet Take 400 mg by mouth in the morning.   mupirocin ointment (BACTROBAN) 2 % APPLY TO AFFECTED AREA 3 TIMES A DAY FOR 7 DAYS (Patient not taking: Reported on 01/03/2023)    ondansetron (ZOFRAN-ODT) 4 MG disintegrating tablet Take 1 tablet (4 mg total) by mouth every 6 (six) hours as needed for nausea or vomiting.   oxyCODONE (OXY IR/ROXICODONE) 5 MG immediate release tablet Take 5-10 mg by mouth at bedtime as needed for moderate pain.    rosuvastatin (CRESTOR) 5 MG tablet Take 1 tablet (5 mg total) by mouth daily.   sildenafil (REVATIO) 20 MG tablet TAKE 4 - 5 TABLETS BY MOUTH DAILY AS NEEDED   sucralfate (CARAFATE) 1 g tablet Take 1  g by mouth daily.    tamsulosin (FLOMAX) 0.4 MG CAPS capsule Take 0.4 mg by mouth daily.   tiZANidine (ZANAFLEX) 4 MG tablet TAKE 1 - 2 TABLETS BY MOUTH THREE TIMES DAILY AS NEEDED   traZODone (DESYREL) 100 MG tablet TAKE 1 TABLET (100 MG TOTAL) BY MOUTH AT BEDTIME. DO NOT TAKE WITH OXYCODONE OR MUSCLE RELAXER.   No facility-administered encounter medications on file as of 03/14/2023.     Lab Results  Component Value Date   WBC 7.1 10/24/2022   HGB 14.5 10/24/2022   HCT 41.9 10/24/2022   PLT 292 10/24/2022   GLUCOSE 90 11/09/2022   CHOL 125 11/09/2022   TRIG 90.0 11/09/2022   HDL 62.80 11/09/2022   LDLCALC 44 11/09/2022   ALT 27 11/09/2022   AST 23 11/09/2022   NA 138 11/09/2022   K 4.4 11/09/2022   CL 103 11/09/2022   CREATININE 0.92 11/09/2022   BUN 11 11/09/2022   CO2 28 11/09/2022   TSH 1.09 11/09/2022   PSA 0.53 11/02/2021   INR 1.0 09/24/2019   HGBA1C 5.6 09/23/2019    MR CERVICAL SPINE WO CONTRAST Result Date: 03/08/2023 CLINICAL DATA:  Initial evaluation for neck pain with radiation into the right upper extremity, associated numbness. EXAM: MRI CERVICAL SPINE WITHOUT CONTRAST TECHNIQUE: Multiplanar, multisequence MR imaging of the cervical spine was performed. No intravenous contrast was administered. COMPARISON:  Prior CT from 09/16/2022. FINDINGS: Alignment: Vertebral bodies normally aligned with preservation of the normal cervical lordosis. No listhesis. Vertebrae: Vertebral body height maintained without  acute or chronic fracture. Bone marrow signal intensity within normal limits. No discrete or worrisome osseous lesions. Degenerative reactive endplate change with mild marrow edema present about the C6-7 interspace. No other abnormal marrow edema. Cord: Normal signal and morphology. Posterior Fossa, vertebral arteries, paraspinal tissues: Unremarkable. Disc levels: C2-C3: Normal interspace. Mild left-sided facet spurring. No canal or foraminal stenosis. C3-C4: Mild disc bulge with right greater than left uncovertebral spurring. Mild right-sided facet hypertrophy. Flattening of the ventral thecal sac with minimal cord flattening, but no cord signal changes or significant spinal stenosis. Moderate right C4 foraminal stenosis. Left neural foramina remains patent. C4-C5: Mild disc bulge with right-sided uncovertebral spurring. No spinal stenosis. Foramina remain patent. C5-C6: Moderate degenerative intervertebral disc space narrowing. Broad-based left paracentral disc osteophyte complex flattens and partially faces the ventral thecal sac. Mild cord flattening without cord signal changes. Mild spinal stenosis. Moderate to severe bilateral C6 foraminal narrowing, slightly worse on the right. C6-C7: Moderate degenerate intervertebral disc space narrowing with diffuse disc osteophyte complex, asymmetric to the left. Broad posterior component flattens and partially faces the ventral thecal sac. Minimal cord flattening without cord signal changes. Mild spinal stenosis. Severe left with moderate to severe right C7 foraminal stenosis. C7-T1: Minimal disc bulge. Mild bilateral facet hypertrophy. No canal or foraminal stenosis. T1-2: Seen only on sagittal projection. Central to right paracentral disc protrusion mildly indents the ventral thecal sac. No significant spinal stenosis. Foramina remain patent. IMPRESSION: 1. Multilevel cervical spondylosis with resultant mild spinal stenosis at C5-6 and C6-7. 2. Multifactorial  degenerative changes with resultant multilevel foraminal narrowing as above. Notable findings include moderate right C4 foraminal stenosis, moderate to severe bilateral C6 and C7 foraminal narrowing, with severe left and moderate to severe right C7 foraminal stenosis. Electronically Signed   By: Rise Mu M.D.   On: 03/08/2023 02:51       Assessment & Plan:  There are no diagnoses linked to this encounter.  Dale Linden, MD

## 2023-03-14 NOTE — Assessment & Plan Note (Signed)
Seeing neurology for persistent headaches. Overall improved. Evaluated 02/13/23 - reported tingling finger tips and radiating to right shoulder/neck. Taking nortriptyline. Recommended MRI c-spine. Gabapentin changed to lyrica. Referred to [facility] - question of osteonecrosis of lunate. Saw cardiology 02/15/23 - worsening exertional dyspnea and heart racing. Recommended 72 hour holter and stress echo. Stress test - no evidence of scar or ischemia. Holter - Sinus rhythm with mean heart rate 83 bpm, range 55 to 150 bpm. Occasional premature atrial contractions. 4 brief runs of SVT longest lasting 4 beats. Rare premature ventricular contraction

## 2023-03-14 NOTE — Progress Notes (Signed)
Patient ID: [name], male   DOB: Feb 17, 1960, 63 y.o.   MRN: [id number] Did not show for appt.

## 2023-03-15 ENCOUNTER — Encounter: Payer: Self-pay | Admitting: Internal Medicine

## 2023-03-15 ENCOUNTER — Telehealth: Payer: Medicare HMO | Admitting: Internal Medicine

## 2023-03-15 VITALS — Ht 73.0 in | Wt 204.0 lb

## 2023-03-15 DIAGNOSIS — J111 Influenza due to unidentified influenza virus with other respiratory manifestations: Secondary | ICD-10-CM

## 2023-03-15 MED ORDER — ONDANSETRON 4 MG PO TBDP: 4.0000 mg | ORAL_TABLET | Freq: Three times a day (TID) | ORAL | 0 refills | Status: DC | PRN

## 2023-03-15 MED ORDER — OSELTAMIVIR PHOSPHATE 75 MG PO CAPS: 75.0000 mg | ORAL_CAPSULE | Freq: Two times a day (BID) | ORAL | 0 refills | Status: DC

## 2023-03-15 MED ORDER — AZITHROMYCIN 500 MG PO TABS: 500.0000 mg | ORAL_TABLET | Freq: Every day | ORAL | 0 refills | Status: DC

## 2023-03-15 NOTE — Assessment & Plan Note (Signed)
Will treat empirically for influenza and add antibiotic given current immunosuppression with Humira. Refilling zofran

## 2023-03-15 NOTE — Telephone Encounter (Signed)
Scheduled with Dr Darrick Huntsman this PM

## 2023-03-15 NOTE — Progress Notes (Signed)
Virtual Visit via Caregility   Note   This format is felt to be most appropriate for this patient at this time.  All issues noted in this document were discussed and addressed.  No physical exam was performed (except for noted visual exam findings with Video Visits).   I connected with [name]  on 03/15/23 at  4:30 PM EST by a video enabled telemedicine application and verified that I am speaking with the correct person using two identifiers. Location patient: home Location provider: work or home office Persons participating in the virtual visit: patient, provider  I discussed the limitations, risks, security and privacy concerns of performing an evaluation and management service by telephone and the availability of in person appointments. I also discussed with the patient that there may be a patient responsible charge related to this service. The patient expressed understanding and agreed to proceed.   Reason for visit: FLU LIKE ILLNESS   HPI:  63 yr old male with RA, MANAGED WITH Humira presents with 2 day history  of sore throat, body aches, dizziness, headaches and  chest tightness, .  No recent travel or sick contacts.   Takes Humira for RA.  COVID TEST NEGATIVE X 2 DAYS   ROS: See pertinent positives and negatives per HPI.  Past Medical History:  Diagnosis Date   Allergy    Anoxic brain injury [facility])    Arthritis    Awareness under anesthesia    need a lot to put me under   Chronic back pain    s/p 5 back surgeries   Coronary artery disease    Cardiac catheterization in 2007 at [facility] showed 30% stenosis in proximal LAD. No other disease. Ejection fraction was 65%   Depression    H/O acute pancreatitis    Hypercholesterolemia    Hypertension    Mild cognitive impairment with memory loss    Pancreatitis     Past Surgical History:  Procedure Laterality Date   abdomial surgery     BACK SURGERY     multiple back surgeries (5)   CARDIAC CATHETERIZATION     [facility]     CHOLECYSTECTOMY  2013   COLONOSCOPY N/A 03/05/2021   Procedure: COLONOSCOPY;  Surgeon: Jaynie Collins, DO;  Location: [facility] ENDOSCOPY;  Service: Gastroenterology;  Laterality: N/A;  IDDM   COLONOSCOPY WITH PROPOFOL N/A 06/30/2015   Procedure: COLONOSCOPY WITH PROPOFOL;  Surgeon: Christena Deem, MD;  Location: [facility] ENDOSCOPY;  Service: Endoscopy;  Laterality: N/A;   ESOPHAGOGASTRODUODENOSCOPY (EGD) WITH PROPOFOL N/A 03/30/2016   Procedure: ESOPHAGOGASTRODUODENOSCOPY (EGD) WITH PROPOFOL;  Surgeon: Scot Jun, MD;  Location: [facility] ENDOSCOPY;  Service: Endoscopy;  Laterality: N/A;   FUNCTIONAL ENDOSCOPIC SINUS SURGERY     JOINT REPLACEMENT     LAMINECTOMY     LUMBAR SPINE SURGERY     SHOULDER ARTHROSCOPY WITH ROTATOR CUFF REPAIR AND OPEN BICEPS TENODESIS Right    SHOULDER ARTHROSCOPY WITH ROTATOR CUFF REPAIR AND OPEN BICEPS TENODESIS     SPINE SURGERY     TRIGGER POINT INJECTION     VASECTOMY     With Reversal   VEIN SURGERY Right 2013   arm    Family History  Problem Relation Age of Onset   Heart disease Father    Heart attack Father    Heart disease Mother    Heart attack Mother    Heart attack Brother        MI's x 2    Hypertension Other  Colon cancer Other    Hypertension Sister    Heart attack Paternal Grandfather    Hypertension Sister     SOCIAL HX:  reports that he has never smoked. He has never used smokeless tobacco. He reports that he does not drink alcohol and does not use drugs.    Current Outpatient Medications:    amLODipine (NORVASC) 5 MG tablet, TAKE 1 TABLET BY MOUTH TWICE A DAY, Disp: 180 tablet, Rfl: 1   atomoxetine (STRATTERA) 100 MG capsule, Take 100 mg by mouth daily., Disp: , Rfl:    azithromycin (ZITHROMAX) 500 MG tablet, Take 1 tablet (500 mg total) by mouth daily., Disp: 7 tablet, Rfl: 0   desvenlafaxine (PRISTIQ) 100 MG 24 hr tablet, Take 100 mg by mouth daily., Disp: , Rfl:    escitalopram (LEXAPRO) 10 MG tablet, Take 10 mg by mouth  daily., Disp: , Rfl:    gabapentin (NEURONTIN) 300 MG capsule, Take 1 or 2 capsules by mouth TID, Disp: , Rfl:    hydrOXYzine (ATARAX) 10 MG tablet, TAKE ONE TAB AS NEEDED FOR ITCHING AT BEDTIME, Disp: , Rfl:    ketoconazole (NIZORAL) 2 % shampoo, Apply topically., Disp: , Rfl:    modafinil (PROVIGIL) 200 MG tablet, Take 400 mg by mouth in the morning., Disp: , Rfl:    ondansetron (ZOFRAN-ODT) 4 MG disintegrating tablet, Take 1 tablet (4 mg total) by mouth every 8 (eight) hours as needed for nausea or vomiting., Disp: 20 tablet, Rfl: 0   oseltamivir (TAMIFLU) 75 MG capsule, Take 1 capsule (75 mg total) by mouth 2 (two) times daily., Disp: 10 capsule, Rfl: 0   oxyCODONE (OXY IR/ROXICODONE) 5 MG immediate release tablet, Take 5-10 mg by mouth at bedtime as needed for moderate pain. , Disp: , Rfl: 0   rosuvastatin (CRESTOR) 5 MG tablet, Take 1 tablet (5 mg total) by mouth daily., Disp: 90 tablet, Rfl: 3   sildenafil (REVATIO) 20 MG tablet, TAKE 4 - 5 TABLETS BY MOUTH DAILY AS NEEDED, Disp: , Rfl: 3   sucralfate (CARAFATE) 1 g tablet, Take 1 g by mouth daily. , Disp: , Rfl: 1   tamsulosin (FLOMAX) 0.4 MG CAPS capsule, Take 0.4 mg by mouth daily., Disp: , Rfl:    tiZANidine (ZANAFLEX) 4 MG tablet, TAKE 1 - 2 TABLETS BY MOUTH THREE TIMES DAILY AS NEEDED, Disp: , Rfl:    traZODone (DESYREL) 100 MG tablet, TAKE 1 TABLET (100 MG TOTAL) BY MOUTH AT BEDTIME. DO NOT TAKE WITH OXYCODONE OR MUSCLE RELAXER., Disp: 90 tablet, Rfl: 1   mupirocin ointment (BACTROBAN) 2 %, APPLY TO AFFECTED AREA 3 TIMES A DAY FOR 7 DAYS (Patient not taking: Reported on 03/15/2023), Disp: , Rfl:   EXAM:  VITALS per patient if applicable:  GENERAL: alert, oriented, appears ill  but in no distress  HEENT: atraumatic, conjunttiva clear, no obvious abnormalities on inspection of external nose and ears  NECK: normal movements of the head and neck  LUNGS: on inspection no signs of respiratory distress, breathing rate appears normal,  no obvious gross SOB, gasping or wheezing  CV: no obvious cyanosis  MS: moves all visible extremities without noticeable abnormality  PSYCH/NEURO: pleasant and cooperative, no obvious depression or anxiety, speech and thought processing grossly intact  ASSESSMENT AND PLAN: Influenza-like syndrome Assessment & Plan: Will treat empirically for influenza and add antibiotic given current immunosuppression with Humira. Refilling zofran    Other orders -     Oseltamivir Phosphate; Take 1 capsule (75  mg total) by mouth 2 (two) times daily.  Dispense: 10 capsule; Refill: 0 -     Ondansetron; Take 1 tablet (4 mg total) by mouth every 8 (eight) hours as needed for nausea or vomiting.  Dispense: 20 tablet; Refill: 0 -     Azithromycin; Take 1 tablet (500 mg total) by mouth daily.  Dispense: 7 tablet; Refill: 0      I discussed the assessment and treatment plan with the patient. The patient was provided an opportunity to ask questions and all were answered. The patient agreed with the plan and demonstrated an understanding of the instructions.   The patient was advised to call back or seek an in-person evaluation if the symptoms worsen or if the condition fails to improve as anticipated.   I spent 20 minutes dedicated to the care of this patient on the date of this encounter to include pre-visit review of his medical history,  Face-to-face time with the patient , and post visit ordering of  therapeutics.    Sherlene Shams, MD

## 2023-03-15 NOTE — Patient Instructions (Signed)
I AM TREATING YOU FOR THE FLU (PRESUMED) SINCE YOUR  HOME COVID TEST WAS NEGATIVE .  START TAMIFLU TONIGHT  AND TAKE IT TWICE DAILY FOR 5 DAYS  ADD THE AZITHROMYCIN IF YOU DEVELOP DISCOLORATION TO YOUR SPUTUM   You can add   an extra  2000 mg of acetominophen (tylenol) every day  safely for up to one week,  in addition to your Nyquil doses for the body aches .  I recommend taking 1000 mg in between doses of your Nyquil

## 2023-03-17 NOTE — Telephone Encounter (Signed)
Noted

## 2023-03-17 NOTE — Telephone Encounter (Signed)
Reviewed.  Agree with evaluation if sob, persistent low oxygen, etc - needs to be seen.

## 2023-03-17 NOTE — Telephone Encounter (Signed)
FYI Called patient to discuss. Currently being treated with tamiflu, zpak, and zofran prn. O2 this morning was 91% and temp 103. Currently O2 sat 97% on room air and temp normal. No fever today, denies sob. Feeling some better as the day went on says that he is not as achy today. Eating some and drinking plenty of fluids. Advised patient given it is Friday afternoon, if symptoms change or worsen, he needs to go to the ED or UC for acute evaluation. Pt gave verbal understanding.

## 2023-03-20 DIAGNOSIS — R55 Syncope and collapse: Secondary | ICD-10-CM | POA: Diagnosis not present

## 2023-03-20 DIAGNOSIS — E7800 Pure hypercholesterolemia, unspecified: Secondary | ICD-10-CM | POA: Diagnosis not present

## 2023-03-20 DIAGNOSIS — R000 Tachycardia, unspecified: Secondary | ICD-10-CM | POA: Diagnosis not present

## 2023-03-20 DIAGNOSIS — I10 Essential (primary) hypertension: Secondary | ICD-10-CM | POA: Diagnosis not present

## 2023-03-20 DIAGNOSIS — R002 Palpitations: Secondary | ICD-10-CM | POA: Diagnosis not present

## 2023-03-20 DIAGNOSIS — Z9889 Other specified postprocedural states: Secondary | ICD-10-CM | POA: Diagnosis not present

## 2023-03-21 ENCOUNTER — Encounter: Payer: Self-pay | Admitting: Internal Medicine

## 2023-03-21 ENCOUNTER — Ambulatory Visit (INDEPENDENT_AMBULATORY_CARE_PROVIDER_SITE_OTHER): Payer: Medicare HMO | Admitting: Internal Medicine

## 2023-03-21 VITALS — BP 130/78 | HR 86 | Temp 98.0°F | Resp 16 | Ht 73.0 in | Wt 205.2 lb

## 2023-03-21 DIAGNOSIS — E7800 Pure hypercholesterolemia, unspecified: Secondary | ICD-10-CM

## 2023-03-21 DIAGNOSIS — I10 Essential (primary) hypertension: Secondary | ICD-10-CM | POA: Diagnosis not present

## 2023-03-21 DIAGNOSIS — F320 Major depressive disorder, single episode, mild: Secondary | ICD-10-CM

## 2023-03-21 DIAGNOSIS — J111 Influenza due to unidentified influenza virus with other respiratory manifestations: Secondary | ICD-10-CM

## 2023-03-21 DIAGNOSIS — I77819 Aortic ectasia, unspecified site: Secondary | ICD-10-CM

## 2023-03-21 DIAGNOSIS — M069 Rheumatoid arthritis, unspecified: Secondary | ICD-10-CM

## 2023-03-21 DIAGNOSIS — R519 Headache, unspecified: Secondary | ICD-10-CM

## 2023-03-21 DIAGNOSIS — M545 Other chronic pain: Secondary | ICD-10-CM

## 2023-03-21 DIAGNOSIS — G8929 Other chronic pain: Secondary | ICD-10-CM

## 2023-03-21 LAB — CBC WITH DIFFERENTIAL/PLATELET
Basophils Absolute: 0 10*3/uL (ref 0.0–0.1)
Basophils Relative: 0.3 % (ref 0.0–3.0)
Eosinophils Absolute: 0.2 10*3/uL (ref 0.0–0.7)
Eosinophils Relative: 3.1 % (ref 0.0–5.0)
HCT: 42.9 % (ref 39.0–52.0)
Hemoglobin: 14.6 g/dL (ref 13.0–17.0)
Lymphocytes Relative: 35.5 % (ref 12.0–46.0)
Lymphs Abs: 2.2 10*3/uL (ref 0.7–4.0)
MCHC: 34 g/dL (ref 30.0–36.0)
MCV: 94.1 fL (ref 78.0–100.0)
Monocytes Absolute: 0.6 10*3/uL (ref 0.1–1.0)
Monocytes Relative: 8.9 % (ref 3.0–12.0)
Neutro Abs: 3.3 10*3/uL (ref 1.4–7.7)
Neutrophils Relative %: 52.2 % (ref 43.0–77.0)
Platelets: 324 10*3/uL (ref 150.0–400.0)
RBC: 4.56 Mil/uL (ref 4.22–5.81)
RDW: 12.9 % (ref 11.5–15.5)
WBC: 6.2 10*3/uL (ref 4.0–10.5)

## 2023-03-21 LAB — HEPATIC FUNCTION PANEL
ALT: 52 U/L (ref 0–53)
AST: 30 U/L (ref 0–37)
Albumin: 4.4 g/dL (ref 3.5–5.2)
Alkaline Phosphatase: 156 U/L — ABNORMAL HIGH (ref 39–117)
Bilirubin, Direct: 0.1 mg/dL (ref 0.0–0.3)
Total Bilirubin: 0.3 mg/dL (ref 0.2–1.2)
Total Protein: 7.8 g/dL (ref 6.0–8.3)

## 2023-03-21 LAB — BASIC METABOLIC PANEL
BUN: 21 mg/dL (ref 6–23)
CO2: 29 meq/L (ref 19–32)
Calcium: 9.1 mg/dL (ref 8.4–10.5)
Chloride: 101 meq/L (ref 96–112)
Creatinine, Ser: 1.01 mg/dL (ref 0.40–1.50)
GFR: 80.8 mL/min (ref >60.00)
Glucose, Bld: 94 mg/dL (ref 70–99)
Potassium: 4.5 meq/L (ref 3.5–5.1)
Sodium: 138 meq/L (ref 135–145)

## 2023-03-21 LAB — LIPID PANEL
Cholesterol: 123 mg/dL (ref 0–200)
HDL: 44.2 mg/dL (ref >39.00)
LDL Cholesterol: 51 mg/dL (ref 0–99)
NonHDL: 78.74
Total CHOL/HDL Ratio: 3
Triglycerides: 139 mg/dL (ref 0.0–149.0)
VLDL: 27.8 mg/dL (ref 0.0–40.0)

## 2023-03-21 MED ORDER — AZELASTINE HCL 0.1 % NA SOLN: 1.0000 | Freq: Two times a day (BID) | NASAL | 1 refills | Status: DC

## 2023-03-21 NOTE — Patient Instructions (Addendum)
Saline nasal spray - flush nose 1-2 x / day  Robitussin to help with congestion.

## 2023-03-21 NOTE — Assessment & Plan Note (Signed)
Continue amlodipine. Blood pressure as outlined.  Follow pressures.  Follow metabolic panel.  °

## 2023-03-21 NOTE — Assessment & Plan Note (Signed)
Aortic root ectasia - 4cm (CT chest - [facility] 03/31/2022).  Followed by cardiology. Just evaluated yesterday. Obtain records.

## 2023-03-21 NOTE — Assessment & Plan Note (Signed)
Being followed by rheumatology.  On humira.  Stable. Check cbc today.

## 2023-03-21 NOTE — Assessment & Plan Note (Signed)
Followed by pain clinic/[facility].  Stable. Seeing Dr Lynn Ito - f/u low back pain and neck pain.  Pain fairly controlled with medications, exercise and trigger point injections.  Receives trigger point injections and recommended to continue gabapentin and tizanidine.

## 2023-03-21 NOTE — Assessment & Plan Note (Signed)
Low cholesterol diet and exercise.  Continue crestor.  Check lipid panel and liver function tests.   

## 2023-03-21 NOTE — Progress Notes (Signed)
Subjective:    Patient ID: [name], male    DOB: 12/11/1960, 63 y.o.   MRN: [id number]  Patient here for follow up appt.   HPI Here for a scheduled follow up - f/u regarding RA, hypertension and hypercholesterolemia. Recently evaluated 03/15/23 - Dr Darrick Huntsman - for influenza-like syndrome. Treated with tamiflu and azithromycin. He is doing better. No fever now. Eating and drinking. No vomiting or diarrhea. He is having some persistent chest congestion and some cough. Much improved. Still with some fatigue. Seeing neurology for persistent headaches. Overall had been improved. Was evaluated 02/13/23 - reported tingling finger tips and radiating to right shoulder/neck. Recommended MRI c-spine. MRI 02/25/23 - multilevel cervical spondylosis with mild spinal stenosis C5-6 and C6-7. Multifactorial degenerative changes with resultant multilevel foraminal narrowing as above. Notable findings include moderate right C4 foraminal stenosis, moderate to severe bilateral C6 and C7 foraminal narrowing, with severe left and moderate to severe right C7 foraminal stenosis.Gabapentin changed to lyrica. Referred to [facility] - question of osteonecrosis of lunate. Saw cardiology 02/15/23 - worsening exertion dyspnea and heart racing. Recommended 72 hour holter and stress echo. Stress test - no evidence of scar or ischemia. Holter - Sinus rhythm with mean heart rate 83, range 55-150 bpm. Occasional premature atrial contractions. 4 brief runs of SVT - longest lasting 4 beats. Rare PVC. Had f/u with cardiology yesterday. Reports everything checked out ok.     Past Medical History:  Diagnosis Date   Allergy    Anoxic brain injury [facility])    Arthritis    Awareness under anesthesia    need a lot to put me under   Chronic back pain    s/p 5 back surgeries   Coronary artery disease    Cardiac catheterization in 2007 at [facility] showed 30% stenosis in proximal LAD. No other disease. Ejection fraction was 65%   Depression    H/O  acute pancreatitis    Hypercholesterolemia    Hypertension    Mild cognitive impairment with memory loss    Pancreatitis    Past Surgical History:  Procedure Laterality Date   abdomial surgery     BACK SURGERY     multiple back surgeries (5)   CARDIAC CATHETERIZATION     [facility]    CHOLECYSTECTOMY  2013   COLONOSCOPY N/A 03/05/2021   Procedure: COLONOSCOPY;  Surgeon: Jaynie Collins, DO;  Location: [facility] ENDOSCOPY;  Service: Gastroenterology;  Laterality: N/A;  IDDM   COLONOSCOPY WITH PROPOFOL N/A 06/30/2015   Procedure: COLONOSCOPY WITH PROPOFOL;  Surgeon: Christena Deem, MD;  Location: [facility] ENDOSCOPY;  Service: Endoscopy;  Laterality: N/A;   ESOPHAGOGASTRODUODENOSCOPY (EGD) WITH PROPOFOL N/A 03/30/2016   Procedure: ESOPHAGOGASTRODUODENOSCOPY (EGD) WITH PROPOFOL;  Surgeon: Scot Jun, MD;  Location: [facility] ENDOSCOPY;  Service: Endoscopy;  Laterality: N/A;   FUNCTIONAL ENDOSCOPIC SINUS SURGERY     JOINT REPLACEMENT     LAMINECTOMY     LUMBAR SPINE SURGERY     SHOULDER ARTHROSCOPY WITH ROTATOR CUFF REPAIR AND OPEN BICEPS TENODESIS Right    SHOULDER ARTHROSCOPY WITH ROTATOR CUFF REPAIR AND OPEN BICEPS TENODESIS     SPINE SURGERY     TRIGGER POINT INJECTION     VASECTOMY     With Reversal   VEIN SURGERY Right 2013   arm   Family History  Problem Relation Age of Onset   Heart disease Father    Heart attack Father    Heart disease Mother    Heart attack Mother  Heart attack Brother        MI's x 2    Hypertension Other    Colon cancer Other    Hypertension Sister    Heart attack Paternal Grandfather    Hypertension Sister    Social History   Socioeconomic History   Marital status: Divorced    Spouse name: Not on file   Number of children: 3   Years of education: Not on file   Highest education level: Not on file  Occupational History   Not on file  Tobacco Use   Smoking status: Never   Smokeless tobacco: Never  Vaping Use   Vaping status: Never Used   Substance and Sexual Activity   Alcohol use: No    Alcohol/week: 0.0 standard drinks of alcohol   Drug use: No   Sexual activity: Yes  Other Topics Concern   Not on file  Social History Narrative   Not on file   Social Drivers of Health   Financial Resource Strain: Low Risk  (12/29/2020)   Overall Financial Resource Strain (CARDIA)    Difficulty of Paying Living Expenses: Not hard at all  Food Insecurity: No Food Insecurity (03/19/2021)   Received from [facility]   Hunger Vital Sign  Transportation Needs: No Transportation Needs (03/19/2021)   Received from [facility]   PRAPARE - Transportation  Physical Activity: Not on file  Stress: No Stress Concern Present (12/29/2020)   [employer] of Occupational Health - Occupational Stress Questionnaire    Feeling of Stress : Not at all  Social Connections: Unknown (12/29/2020)   Social Connection and Isolation Panel [NHANES]    Frequency of Communication with Friends and Family: More than three times a week    Frequency of Social Gatherings with Friends and Family: More than three times a week    Attends Religious Services: Not on file    Active Member of Clubs or Organizations: Not on file    Attends Banker Meetings: Not on file    Marital Status: Not on file     Review of Systems  Constitutional:  Negative for appetite change, fever and unexpected weight change.  HENT:  Negative for sinus pressure.        Some congestion and drainage.   Respiratory:  Positive for cough. Negative for chest tightness and shortness of breath.        Cough has improved.   Cardiovascular:  Negative for chest pain, palpitations and leg swelling.  Gastrointestinal:  Negative for abdominal pain, diarrhea, nausea and vomiting.  Genitourinary:  Negative for difficulty urinating and dysuria.  Musculoskeletal:  Positive for back pain.       Chronic back pain. Persistent right wrist pain. Planning  to f/u with ortho.   Skin:  Negative for color change and rash.  Neurological:  Negative for dizziness and headaches.  Psychiatric/Behavioral:  Negative for agitation and dysphoric mood.        Objective:     BP 130/78   Pulse 86   Temp 98 F (36.7 C) (Oral)   Resp 16   Ht 6\' 1"  (1.854 m)   Wt 205 lb 3.2 oz (93.1 kg)   SpO2 98%   BMI 27.07 kg/m  Wt Readings from Last 3 Encounters:  03/21/23 205 lb 3.2 oz (93.1 kg)  03/15/23 204 lb (92.5 kg)  01/03/23 204 lb (92.5 kg)    Physical Exam Vitals reviewed.  Constitutional:  General: He is not in acute distress.    Appearance: Normal appearance. He is well-developed.  HENT:     Head: Normocephalic and atraumatic.     Right Ear: External ear normal.     Left Ear: External ear normal.  Eyes:     General: No scleral icterus.       Right eye: No discharge.        Left eye: No discharge.     Conjunctiva/sclera: Conjunctivae normal.  Cardiovascular:     Rate and Rhythm: Normal rate and regular rhythm.  Pulmonary:     Effort: Pulmonary effort is normal. No respiratory distress.     Breath sounds: Normal breath sounds.  Abdominal:     General: Bowel sounds are normal.     Palpations: Abdomen is soft.     Tenderness: There is no abdominal tenderness.  Musculoskeletal:        General: No swelling.     Cervical back: Neck supple. No tenderness.     Comments: Tenderness to palpation - right wrist.   Lymphadenopathy:     Cervical: No cervical adenopathy.  Skin:    Findings: No erythema or rash.  Neurological:     Mental Status: He is alert.  Psychiatric:        Mood and Affect: Mood normal.        Behavior: Behavior normal.         Outpatient Encounter Medications as of 03/21/2023  Medication Sig   azelastine (ASTELIN) 0.1 % nasal spray Place 1 spray into both nostrils 2 (two) times daily. Use in each nostril as directed   amLODipine (NORVASC) 5 MG tablet TAKE 1 TABLET BY MOUTH TWICE A DAY   atomoxetine  (STRATTERA) 100 MG capsule Take 100 mg by mouth daily.   desvenlafaxine (PRISTIQ) 100 MG 24 hr tablet Take 100 mg by mouth daily.   escitalopram (LEXAPRO) 10 MG tablet Take 10 mg by mouth daily.   gabapentin (NEURONTIN) 300 MG capsule Take 1 or 2 capsules by mouth TID   hydrOXYzine (ATARAX) 10 MG tablet TAKE ONE TAB AS NEEDED FOR ITCHING AT BEDTIME   ketoconazole (NIZORAL) 2 % shampoo Apply topically.   modafinil (PROVIGIL) 200 MG tablet Take 400 mg by mouth in the morning.   oxyCODONE (OXY IR/ROXICODONE) 5 MG immediate release tablet Take 5-10 mg by mouth at bedtime as needed for moderate pain.    rosuvastatin (CRESTOR) 5 MG tablet Take 1 tablet (5 mg total) by mouth daily.   sildenafil (REVATIO) 20 MG tablet TAKE 4 - 5 TABLETS BY MOUTH DAILY AS NEEDED   sucralfate (CARAFATE) 1 g tablet Take 1 g by mouth daily.    tamsulosin (FLOMAX) 0.4 MG CAPS capsule Take 0.4 mg by mouth daily.   tiZANidine (ZANAFLEX) 4 MG tablet TAKE 1 - 2 TABLETS BY MOUTH THREE TIMES DAILY AS NEEDED   traZODone (DESYREL) 100 MG tablet TAKE 1 TABLET (100 MG TOTAL) BY MOUTH AT BEDTIME. DO NOT TAKE WITH OXYCODONE OR MUSCLE RELAXER.   [DISCONTINUED] azithromycin (ZITHROMAX) 500 MG tablet Take 1 tablet (500 mg total) by mouth daily.   [DISCONTINUED] mupirocin ointment (BACTROBAN) 2 % APPLY TO AFFECTED AREA 3 TIMES A DAY FOR 7 DAYS (Patient not taking: Reported on 03/15/2023)   [DISCONTINUED] ondansetron (ZOFRAN-ODT) 4 MG disintegrating tablet Take 1 tablet (4 mg total) by mouth every 8 (eight) hours as needed for nausea or vomiting.   [DISCONTINUED] oseltamivir (TAMIFLU) 75 MG capsule Take 1 capsule (75 mg  total) by mouth 2 (two) times daily.   No facility-administered encounter medications on file as of 03/21/2023.     Lab Results  Component Value Date   WBC 7.1 10/24/2022   HGB 14.5 10/24/2022   HCT 41.9 10/24/2022   PLT 292 10/24/2022   GLUCOSE 90 11/09/2022   CHOL 125 11/09/2022   TRIG 90.0 11/09/2022   HDL 62.80  11/09/2022   LDLCALC 44 11/09/2022   ALT 27 11/09/2022   AST 23 11/09/2022   NA 138 11/09/2022   K 4.4 11/09/2022   CL 103 11/09/2022   CREATININE 0.92 11/09/2022   BUN 11 11/09/2022   CO2 28 11/09/2022   TSH 1.09 11/09/2022   PSA 0.53 11/02/2021   INR 1.0 09/24/2019   HGBA1C 5.6 09/23/2019    MR CERVICAL SPINE WO CONTRAST Result Date: 03/08/2023 CLINICAL DATA:  Initial evaluation for neck pain with radiation into the right upper extremity, associated numbness. EXAM: MRI CERVICAL SPINE WITHOUT CONTRAST TECHNIQUE: Multiplanar, multisequence MR imaging of the cervical spine was performed. No intravenous contrast was administered. COMPARISON:  Prior CT from 09/16/2022. FINDINGS: Alignment: Vertebral bodies normally aligned with preservation of the normal cervical lordosis. No listhesis. Vertebrae: Vertebral body height maintained without acute or chronic fracture. Bone marrow signal intensity within normal limits. No discrete or worrisome osseous lesions. Degenerative reactive endplate change with mild marrow edema present about the C6-7 interspace. No other abnormal marrow edema. Cord: Normal signal and morphology. Posterior Fossa, vertebral arteries, paraspinal tissues: Unremarkable. Disc levels: C2-C3: Normal interspace. Mild left-sided facet spurring. No canal or foraminal stenosis. C3-C4: Mild disc bulge with right greater than left uncovertebral spurring. Mild right-sided facet hypertrophy. Flattening of the ventral thecal sac with minimal cord flattening, but no cord signal changes or significant spinal stenosis. Moderate right C4 foraminal stenosis. Left neural foramina remains patent. C4-C5: Mild disc bulge with right-sided uncovertebral spurring. No spinal stenosis. Foramina remain patent. C5-C6: Moderate degenerative intervertebral disc space narrowing. Broad-based left paracentral disc osteophyte complex flattens and partially faces the ventral thecal sac. Mild cord flattening without cord  signal changes. Mild spinal stenosis. Moderate to severe bilateral C6 foraminal narrowing, slightly worse on the right. C6-C7: Moderate degenerate intervertebral disc space narrowing with diffuse disc osteophyte complex, asymmetric to the left. Broad posterior component flattens and partially faces the ventral thecal sac. Minimal cord flattening without cord signal changes. Mild spinal stenosis. Severe left with moderate to severe right C7 foraminal stenosis. C7-T1: Minimal disc bulge. Mild bilateral facet hypertrophy. No canal or foraminal stenosis. T1-2: Seen only on sagittal projection. Central to right paracentral disc protrusion mildly indents the ventral thecal sac. No significant spinal stenosis. Foramina remain patent. IMPRESSION: 1. Multilevel cervical spondylosis with resultant mild spinal stenosis at C5-6 and C6-7. 2. Multifactorial degenerative changes with resultant multilevel foraminal narrowing as above. Notable findings include moderate right C4 foraminal stenosis, moderate to severe bilateral C6 and C7 foraminal narrowing, with severe left and moderate to severe right C7 foraminal stenosis. Electronically Signed   By: Rise Mu M.D.   On: 03/08/2023 02:51       Assessment & Plan:  Aortic dilatation [facility]) Assessment & Plan: Aortic root ectasia - 4cm (CT chest - [facility] 03/31/2022).  Followed by cardiology. Just evaluated yesterday. Obtain records.    Hypercholesteremia Assessment & Plan: Low cholesterol diet and exercise.  Continue crestor.  Check lipid panel and liver function tests.    Orders: -     Lipid panel -  Hepatic function panel -     Basic metabolic panel  Rheumatoid arthritis, involving unspecified site, unspecified whether rheumatoid factor present [facility]) Assessment & Plan: Being followed by rheumatology.  On humira.  Stable. Check cbc today.   Orders: -     Basic metabolic panel -     CBC with Differential/Platelet  Chronic low back pain, unspecified  back pain laterality, unspecified whether sciatica present Assessment & Plan: Followed by pain clinic/[facility].  Stable. Seeing Dr Lynn Ito - f/u low back pain and neck pain.  Pain fairly controlled with medications, exercise and trigger point injections.  Receives trigger point injections and recommended to continue gabapentin and tizanidine.    Depression, major, single episode, mild (HCC) Assessment & Plan: Has been followed by Dr Evelene Croon.  Stable. Follow.    Nonintractable headache, unspecified chronicity pattern, unspecified headache type Assessment & Plan: Saw neurology. Note reviewed. Headaches better.  MRI c-spine as outlined.    Primary hypertension Assessment & Plan: Continue amlodipine. Blood pressure as outlined. Follow pressures. Follow metabolic panel.     Influenza-like syndrome Assessment & Plan: Recently treated with tamiflu and azithromycin. Doing better. Some persistent congestion as outlined. Astelin nasal spray, saline nasal spray and robitussin. Follow.  Call if persistent symptoms.    Other orders -     Azelastine HCl; Place 1 spray into both nostrils 2 (two) times daily. Use in each nostril as directed  Dispense: 30 mL; Refill: 1     Dale Elgin, MD

## 2023-03-21 NOTE — Assessment & Plan Note (Signed)
Saw neurology. Note reviewed. Headaches better.  MRI c-spine as outlined.

## 2023-03-21 NOTE — Assessment & Plan Note (Signed)
Has been followed by Dr Evelene Croon.  Stable. Follow.

## 2023-03-21 NOTE — Assessment & Plan Note (Signed)
Recently treated with tamiflu and azithromycin. Doing better. Some persistent congestion as outlined. Astelin nasal spray, saline nasal spray and robitussin. Follow.  Call if persistent symptoms.

## 2023-03-23 ENCOUNTER — Telehealth: Payer: Self-pay

## 2023-03-23 NOTE — Telephone Encounter (Signed)
Noted, moved message to result note

## 2023-03-23 NOTE — Telephone Encounter (Signed)
Copied from CRM [id number]. Topic: Clinical - Medication Question >> Mar 23, 2023  2:35 PM [place] wrote: Reason for CRM: patient returning a call from [place]. Asked PT if I could read the results and he wants a nurse or DR to read them. Please call PT back

## 2023-03-27 ENCOUNTER — Ambulatory Visit: Payer: Self-pay | Admitting: Internal Medicine

## 2023-03-27 ENCOUNTER — Other Ambulatory Visit: Payer: Self-pay

## 2023-03-27 ENCOUNTER — Encounter: Payer: Self-pay | Admitting: Internal Medicine

## 2023-03-27 ENCOUNTER — Emergency Department: Payer: Medicare HMO

## 2023-03-27 ENCOUNTER — Emergency Department
Admission: EM | Admit: 2023-03-27 | Discharge: 2023-03-27 | Disposition: A | Discharge status: Home / Self Care | Payer: Medicare HMO | Attending: Emergency Medicine | Admitting: Emergency Medicine

## 2023-03-27 DIAGNOSIS — R112 Nausea with vomiting, unspecified: Secondary | ICD-10-CM | POA: Diagnosis not present

## 2023-03-27 DIAGNOSIS — N281 Cyst of kidney, acquired: Secondary | ICD-10-CM | POA: Diagnosis not present

## 2023-03-27 DIAGNOSIS — R1084 Generalized abdominal pain: Secondary | ICD-10-CM | POA: Diagnosis not present

## 2023-03-27 DIAGNOSIS — R197 Diarrhea, unspecified: Secondary | ICD-10-CM | POA: Insufficient documentation

## 2023-03-27 DIAGNOSIS — R1013 Epigastric pain: Secondary | ICD-10-CM | POA: Diagnosis present

## 2023-03-27 DIAGNOSIS — K859 Acute pancreatitis without necrosis or infection, unspecified: Secondary | ICD-10-CM | POA: Diagnosis not present

## 2023-03-27 DIAGNOSIS — R509 Fever, unspecified: Secondary | ICD-10-CM | POA: Diagnosis present

## 2023-03-27 DIAGNOSIS — I10 Essential (primary) hypertension: Secondary | ICD-10-CM | POA: Diagnosis not present

## 2023-03-27 DIAGNOSIS — K5730 Diverticulosis of large intestine without perforation or abscess without bleeding: Secondary | ICD-10-CM | POA: Diagnosis not present

## 2023-03-27 DIAGNOSIS — I2510 Atherosclerotic heart disease of native coronary artery without angina pectoris: Secondary | ICD-10-CM | POA: Diagnosis not present

## 2023-03-27 DIAGNOSIS — K429 Umbilical hernia without obstruction or gangrene: Secondary | ICD-10-CM | POA: Diagnosis not present

## 2023-03-27 LAB — COMPREHENSIVE METABOLIC PANEL
ALT: 81 U/L — ABNORMAL HIGH (ref 0–44)
AST: 56 U/L — ABNORMAL HIGH (ref 15–41)
Albumin: 4.1 g/dL (ref 3.5–5.0)
Alkaline Phosphatase: 141 U/L — ABNORMAL HIGH (ref 38–126)
Anion gap: 12 (ref 5–15)
BUN: 21 mg/dL — ABNORMAL HIGH (ref 6–20)
CO2: 25 mmol/L (ref 22–32)
Calcium: 8.5 mg/dL — ABNORMAL LOW (ref 8.9–10.3)
Chloride: 99 mmol/L (ref 98–111)
Creatinine, Ser: 1.04 mg/dL (ref 0.61–1.24)
GFR, Estimated: >60 mL/min (ref >60)
Glucose, Bld: 110 mg/dL — ABNORMAL HIGH (ref 70–99)
Potassium: 4.1 mmol/L (ref 3.5–5.1)
Sodium: 136 mmol/L (ref 135–145)
Total Bilirubin: 0.6 mg/dL (ref 0.0–1.2)
Total Protein: 8.1 g/dL (ref 6.5–8.1)

## 2023-03-27 LAB — RESP PANEL BY RT-PCR (RSV, FLU A&B, COVID)  RVPGX2
Influenza A by PCR: NEGATIVE
Influenza B by PCR: NEGATIVE
Resp Syncytial Virus by PCR: NEGATIVE
SARS Coronavirus 2 by RT PCR: NEGATIVE

## 2023-03-27 LAB — URINALYSIS, ROUTINE W REFLEX MICROSCOPIC
Bilirubin Urine: NEGATIVE
Glucose, UA: NEGATIVE mg/dL
Hgb urine dipstick: NEGATIVE
Ketones, ur: NEGATIVE mg/dL
Leukocytes,Ua: NEGATIVE
Nitrite: NEGATIVE
Protein, ur: NEGATIVE mg/dL
Specific Gravity, Urine: >1.046 — ABNORMAL HIGH (ref 1.005–1.030)
pH: 5 (ref 5.0–8.0)

## 2023-03-27 LAB — CBC
HCT: 44.1 % (ref 39.0–52.0)
Hemoglobin: 15.1 g/dL (ref 13.0–17.0)
MCH: 31.4 pg (ref 26.0–34.0)
MCHC: 34.2 g/dL (ref 30.0–36.0)
MCV: 91.7 fL (ref 80.0–100.0)
Platelets: 332 10*3/uL (ref 150–400)
RBC: 4.81 MIL/uL (ref 4.22–5.81)
RDW: 13.2 % (ref 11.5–15.5)
WBC: 10.8 10*3/uL — ABNORMAL HIGH (ref 4.0–10.5)
nRBC: 0 % (ref 0.0–0.2)

## 2023-03-27 LAB — LIPASE, BLOOD: Lipase: 65 U/L — ABNORMAL HIGH (ref 11–51)

## 2023-03-27 MED ORDER — IOHEXOL 300 MG/ML  SOLN
100.0000 mL | Freq: Once | INTRAMUSCULAR | Status: AC | PRN
  Administered 2023-03-27: 100 mL via INTRAVENOUS

## 2023-03-27 MED ORDER — OXYCODONE-ACETAMINOPHEN 5-325 MG PO TABS
1.0000 | ORAL_TABLET | Freq: Once | ORAL | Status: AC
  Administered 2023-03-27: 1 via ORAL
  Filled 2023-03-27: qty 1

## 2023-03-27 MED ORDER — MORPHINE SULFATE (PF) 4 MG/ML IV SOLN
4.0000 mg | Freq: Once | INTRAVENOUS | Status: AC
  Administered 2023-03-27: 4 mg via INTRAVENOUS
  Filled 2023-03-27: qty 1

## 2023-03-27 MED ORDER — ONDANSETRON HCL 4 MG/2ML IJ SOLN
4.0000 mg | Freq: Once | INTRAMUSCULAR | Status: AC
Start: 2023-03-27 — End: 2023-03-27
  Administered 2023-03-27: 4 mg via INTRAVENOUS
  Filled 2023-03-27: qty 2

## 2023-03-27 MED ORDER — ONDANSETRON HCL 4 MG/2ML IJ SOLN
4.0000 mg | Freq: Once | INTRAMUSCULAR | Status: AC
  Administered 2023-03-27: 4 mg via INTRAVENOUS
  Filled 2023-03-27: qty 2

## 2023-03-27 MED ORDER — SODIUM CHLORIDE 0.9 % IV SOLN: Freq: Once | INTRAVENOUS | Status: AC

## 2023-03-27 NOTE — ED Triage Notes (Signed)
 Pt here with vomiting since yesterday. Pt endorses all over abd pain. Pt having intermittent fevers and chills, 100.2 this morning. Pt has had the flu for a few weeks.

## 2023-03-27 NOTE — ED Provider Notes (Signed)
 [facility] Provider Note    Event Date/Time   First MD Initiated Contact with Patient 03/27/23 1132     (approximate)   History   Emesis   HPI  [name] is a 63 y.o. male with a history of CAD hypertension pancreatitis who presents with complaints of nausea vomiting, loose stools and epigastric discomfort.  He states he does not feel the same as pancreatitis in the past but somewhat similar.  Symptoms started over the last 2 days.     Physical Exam   Triage Vital Signs: ED Triage Vitals [03/27/23 1045]  Encounter Vitals Group     BP (!) 150/87     Systolic BP Percentile      Diastolic BP Percentile      Pulse Rate (!) 104     Resp 18     Temp 98.7 F (37.1 C)     Temp Source Oral     SpO2 96 %     Weight 93.1 kg (205 lb 4 oz)     Height 1.854 m (6\' 1" )     Head Circumference      Peak Flow      Pain Score 9     Pain Loc      Pain Education      Exclude from Growth Chart     Most recent vital signs: Vitals:   03/27/23 1045 03/27/23 1519  BP: (!) 150/87 123/74  Pulse: (!) 104 87  Resp: 18 18  Temp: 98.7 F (37.1 C) 98.6 F (37 C)  SpO2: 96% 98%     General: Awake, no distress.  CV:  Good peripheral perfusion.  Resp:  Normal effort.  Abd:  No distention.  Mild epigastric tenderness to palpation Other:     ED Results / Procedures / Treatments   Labs (all labs ordered are listed, but only abnormal results are displayed) Labs Reviewed  LIPASE, BLOOD - Abnormal; Notable for the following components:      Result Value   Lipase 65 (*)    All other components within normal limits  COMPREHENSIVE METABOLIC PANEL - Abnormal; Notable for the following components:   Glucose, Bld 110 (*)    BUN 21 (*)    Calcium 8.5 (*)    AST 56 (*)    ALT 81 (*)    Alkaline Phosphatase 141 (*)    All other components within normal limits  CBC - Abnormal; Notable for the following components:   WBC 10.8 (*)    All other components  within normal limits  URINALYSIS, ROUTINE W REFLEX MICROSCOPIC - Abnormal; Notable for the following components:   Color, Urine YELLOW (*)    APPearance CLEAR (*)    Specific Gravity, Urine >1.046 (*)    All other components within normal limits  RESP PANEL BY RT-PCR (RSV, FLU A&B, COVID)  [id number]     EKG     RADIOLOGY CT abdomen pelvis viewed interpret by me, no acute abnormality noted, pending radiology read    PROCEDURES:  Critical Care performed:   Procedures   MEDICATIONS ORDERED IN ED: Medications  0.9 %  sodium chloride infusion (0 mLs Intravenous Stopped 03/27/23 1527)  ondansetron (ZOFRAN) injection 4 mg (4 mg Intravenous Given 03/27/23 1150)  morphine (PF) 4 MG/ML injection 4 mg (4 mg Intravenous Given 03/27/23 1149)  iohexol (OMNIPAQUE) 300 MG/ML solution 100 mL (100 mLs Intravenous Contrast Given 03/27/23 1225)  morphine (PF) 4 MG/ML injection 4  mg (4 mg Intravenous Given 03/27/23 1239)  ondansetron (ZOFRAN) injection 4 mg (4 mg Intravenous Given 03/27/23 1514)  oxyCODONE-acetaminophen (PERCOCET/ROXICET) 5-325 MG per tablet 1 tablet (1 tablet Oral Given 03/27/23 1515)     IMPRESSION / MDM / ASSESSMENT AND PLAN / ED COURSE  I reviewed the triage vital signs and the nursing notes. Patient's presentation is most consistent with acute presentation with potential threat to life or bodily function.  Patient presents with abdominal pain, nausea vomiting as above.  Differential includes pancreatitis, gastritis, gastroenteritis which is prevalent in community this time.  Lab work reviewed demonstrates mild elevation of lipase given his history will send for CT abdomen pelvis  Treat with IV morphine, IV Zofran, IV fluids and reevaluate   Have asked my colleague to follow up on CT results       FINAL CLINICAL IMPRESSION(S) / ED DIAGNOSES   Final diagnoses:  Generalized abdominal pain     Rx / DC Orders   ED Discharge Orders     None        Note:  This  document was prepared using Dragon voice recognition software and may include unintentional dictation errors.   Jene Every, MD 03/28/23 [phone]

## 2023-03-27 NOTE — Telephone Encounter (Signed)
 Copied from CRM [id number]. Topic: Clinical - Red Word Triage >> Mar 27, 2023  9:33 AM Marica Otter wrote: [facility] that prompted transfer to Nurse Triage: Patient states he's really weak and vomiting.   Chief Complaint: Nausea/vomiting/weakness Symptoms: nausea, vomiting,  Frequency: Flu like symptoms  Pertinent Negatives: Patient denies headache, vertigo, recent head injuries in the past month Disposition: [x] ED /[] Urgent Care (no appt availability in office) / [] Appointment(In office/virtual)/ []  [facility]/ [] Home Care/ [] Refused Recommended Disposition /[] [facility]/ []  Follow-up with PCP Additional Notes: Patient called and advised that he had the flu, took Tamiflu, then he started feeling better, and now he is feeling weak and congestion. Patient advised that yesterday he started feeling really bad again.  He states he is blowing yellow mucous out of his nose. He denies any headache, vertigo, or recent head injuries in the past month. Patient states that he started vomiting last night, took Zofran, and vomited that up as well.  He started vomiting up yellow as well. Patient states that he has vomited about 19 times since 6pm last night. Patient states that he had a temperature of 100.2 this morning. Patient did not take any medication for the fever because he was scared he wouldn't be able to keep it down due to vomiting. Patient sounds weak and unwell on the phone.  He is advised that at this time with severe vomiting and not being able to keep anything down even with taking nausea medication--it is recommended that he go to the emergency room at this time for further evaluation.  Patient states that he is going to call his daughter and see if she can take him to the emergency room at this time.  He is also advised that he can call an ambulance if needed if he can't get in touch with her.  Patient verbalized understanding.  Reason for Disposition  [1] SEVERE vomiting  (e.g., 6 or more times/day) AND [2] present > 8 hours (Exception: Patient sounds well, is drinking liquids, does not sound dehydrated, and vomiting has lasted less than 24 hours.)  Answer Assessment - Initial Assessment Questions 1. VOMITING SEVERITY: "How many times have you vomited in the past 24 hours?"     - MILD:  1 - 2 times/day    - MODERATE: 3 - 5 times/day, decreased oral intake without significant weight loss or symptoms of dehydration    - SEVERE: 6 or more times/day, vomits everything or nearly everything, with significant weight loss, symptoms of dehydration      19 or more times 2. ONSET: "When did the vomiting begin?"      6pm last night 3. FLUIDS: "What fluids or food have you vomited up today?" "Have you been able to keep any fluids down?"     Everything per patient 4. ABDOMEN PAIN: "Are your having any abdomen pain?" If Yes : "How bad is it and what does it feel like?" (e.g., crampy, dull, intermittent, constant)      "When it hits it feels like pancreatitis" 5. DIARRHEA: "Is there any diarrhea?" If Yes, ask: "How many times today?"      Yes before this started  6. CONTACTS: "Is there anyone else in the family with the same symptoms?"      No 7. CAUSE: "What do you think is causing your vomiting?"     unknown 8. HYDRATION STATUS: "Any signs of dehydration?" (e.g., dry mouth [not only dry lips], too weak to stand) "When  did you last urinate?"     weakness 9. OTHER SYMPTOMS: "Do you have any other symptoms?" (e.g., fever, headache, vertigo, vomiting blood or coffee grounds, recent head injury)     Fever this morning of 100.2  Protocols used: Vomiting-A-AH

## 2023-03-27 NOTE — Telephone Encounter (Signed)
 Left message for Patient to call back and schedule with Dr. Heide Spark for today.

## 2023-03-27 NOTE — Telephone Encounter (Signed)
Patient has arrived at ED.

## 2023-03-27 NOTE — Telephone Encounter (Signed)
Pt currently in the ED.

## 2023-03-27 NOTE — ED Provider Notes (Signed)
 Patient received in signout from Dr. Cyril Loosen pending follow up on CT imaging.  CT does not show any evidence of acute abnormality.  Patient receiving IV fluids.  Feels some improvement.  Tolerating p.o.  Does appear appropriate for outpatient follow-up.   Willy Eddy, MD 03/27/23 1500

## 2023-03-29 ENCOUNTER — Encounter: Payer: Self-pay | Admitting: Internal Medicine

## 2023-04-03 DIAGNOSIS — M5136 Other intervertebral disc degeneration, lumbar region with discogenic back pain only: Secondary | ICD-10-CM | POA: Diagnosis not present

## 2023-04-03 DIAGNOSIS — J90 Pleural effusion, not elsewhere classified: Secondary | ICD-10-CM | POA: Diagnosis not present

## 2023-04-03 DIAGNOSIS — Z79899 Other long term (current) drug therapy: Secondary | ICD-10-CM | POA: Diagnosis not present

## 2023-04-03 DIAGNOSIS — K861 Other chronic pancreatitis: Secondary | ICD-10-CM | POA: Diagnosis not present

## 2023-04-03 DIAGNOSIS — M0579 Rheumatoid arthritis with rheumatoid factor of multiple sites without organ or systems involvement: Secondary | ICD-10-CM | POA: Diagnosis not present

## 2023-04-03 DIAGNOSIS — M5030 Other cervical disc degeneration, unspecified cervical region: Secondary | ICD-10-CM | POA: Diagnosis not present

## 2023-04-03 DIAGNOSIS — G894 Chronic pain syndrome: Secondary | ICD-10-CM | POA: Diagnosis not present

## 2023-04-03 DIAGNOSIS — M7918 Myalgia, other site: Secondary | ICD-10-CM | POA: Diagnosis not present

## 2023-04-03 DIAGNOSIS — M47816 Spondylosis without myelopathy or radiculopathy, lumbar region: Secondary | ICD-10-CM | POA: Diagnosis not present

## 2023-04-03 DIAGNOSIS — J811 Chronic pulmonary edema: Secondary | ICD-10-CM | POA: Diagnosis not present

## 2023-04-05 DIAGNOSIS — R202 Paresthesia of skin: Secondary | ICD-10-CM | POA: Diagnosis not present

## 2023-04-05 DIAGNOSIS — R4189 Other symptoms and signs involving cognitive functions and awareness: Secondary | ICD-10-CM | POA: Diagnosis not present

## 2023-04-05 DIAGNOSIS — H53149 Visual discomfort, unspecified: Secondary | ICD-10-CM | POA: Diagnosis not present

## 2023-04-05 DIAGNOSIS — M545 Low back pain, unspecified: Secondary | ICD-10-CM | POA: Diagnosis not present

## 2023-04-05 DIAGNOSIS — R519 Headache, unspecified: Secondary | ICD-10-CM | POA: Diagnosis not present

## 2023-04-12 ENCOUNTER — Other Ambulatory Visit: Payer: Self-pay | Admitting: Internal Medicine

## 2023-04-12 NOTE — Telephone Encounter (Signed)
 Rx ok'd for astelin - 90 days

## 2023-04-19 DIAGNOSIS — Z0100 Encounter for examination of eyes and vision without abnormal findings: Secondary | ICD-10-CM | POA: Diagnosis not present

## 2023-05-08 DIAGNOSIS — M5030 Other cervical disc degeneration, unspecified cervical region: Secondary | ICD-10-CM | POA: Diagnosis not present

## 2023-05-08 DIAGNOSIS — G894 Chronic pain syndrome: Secondary | ICD-10-CM | POA: Diagnosis not present

## 2023-05-08 DIAGNOSIS — M51361 Other intervertebral disc degeneration, lumbar region with lower extremity pain only: Secondary | ICD-10-CM | POA: Diagnosis not present

## 2023-05-08 DIAGNOSIS — M7918 Myalgia, other site: Secondary | ICD-10-CM | POA: Diagnosis not present

## 2023-05-08 DIAGNOSIS — R519 Headache, unspecified: Secondary | ICD-10-CM | POA: Diagnosis not present

## 2023-05-17 DIAGNOSIS — J029 Acute pharyngitis, unspecified: Secondary | ICD-10-CM | POA: Diagnosis not present

## 2023-05-17 DIAGNOSIS — Z03818 Encounter for observation for suspected exposure to other biological agents ruled out: Secondary | ICD-10-CM | POA: Diagnosis not present

## 2023-05-17 DIAGNOSIS — U071 COVID-19: Secondary | ICD-10-CM | POA: Diagnosis not present

## 2023-05-18 ENCOUNTER — Encounter: Payer: Self-pay | Admitting: Internal Medicine

## 2023-05-18 NOTE — Telephone Encounter (Signed)
 Called and advised that since pt was evaluated at walk-in he needs to call them to try to get another medication and may need to be seen again if no improvement. Pt gave verbal understanding.

## 2023-06-05 DIAGNOSIS — M19011 Primary osteoarthritis, right shoulder: Secondary | ICD-10-CM | POA: Diagnosis not present

## 2023-06-05 DIAGNOSIS — M170 Bilateral primary osteoarthritis of knee: Secondary | ICD-10-CM | POA: Diagnosis not present

## 2023-06-05 DIAGNOSIS — M7918 Myalgia, other site: Secondary | ICD-10-CM | POA: Diagnosis not present

## 2023-06-05 DIAGNOSIS — G894 Chronic pain syndrome: Secondary | ICD-10-CM | POA: Diagnosis not present

## 2023-06-05 DIAGNOSIS — M19022 Primary osteoarthritis, left elbow: Secondary | ICD-10-CM | POA: Diagnosis not present

## 2023-06-05 DIAGNOSIS — M5136 Other intervertebral disc degeneration, lumbar region with discogenic back pain only: Secondary | ICD-10-CM | POA: Diagnosis not present

## 2023-06-05 DIAGNOSIS — M069 Rheumatoid arthritis, unspecified: Secondary | ICD-10-CM | POA: Diagnosis not present

## 2023-06-05 DIAGNOSIS — M5030 Other cervical disc degeneration, unspecified cervical region: Secondary | ICD-10-CM | POA: Diagnosis not present

## 2023-06-05 DIAGNOSIS — M19012 Primary osteoarthritis, left shoulder: Secondary | ICD-10-CM | POA: Diagnosis not present

## 2023-06-05 DIAGNOSIS — M47816 Spondylosis without myelopathy or radiculopathy, lumbar region: Secondary | ICD-10-CM | POA: Diagnosis not present

## 2023-06-05 DIAGNOSIS — Z79899 Other long term (current) drug therapy: Secondary | ICD-10-CM | POA: Diagnosis not present

## 2023-06-23 ENCOUNTER — Emergency Department
Admission: EM | Admit: 2023-06-23 | Discharge: 2023-06-23 | Disposition: A | Discharge status: Home / Self Care | Attending: Emergency Medicine | Admitting: Emergency Medicine

## 2023-06-23 ENCOUNTER — Other Ambulatory Visit: Payer: Self-pay

## 2023-06-23 ENCOUNTER — Emergency Department

## 2023-06-23 DIAGNOSIS — I10 Essential (primary) hypertension: Secondary | ICD-10-CM | POA: Diagnosis not present

## 2023-06-23 DIAGNOSIS — R531 Weakness: Secondary | ICD-10-CM | POA: Diagnosis not present

## 2023-06-23 DIAGNOSIS — R5383 Other fatigue: Secondary | ICD-10-CM | POA: Insufficient documentation

## 2023-06-23 DIAGNOSIS — E785 Hyperlipidemia, unspecified: Secondary | ICD-10-CM | POA: Insufficient documentation

## 2023-06-23 DIAGNOSIS — K861 Other chronic pancreatitis: Secondary | ICD-10-CM | POA: Insufficient documentation

## 2023-06-23 DIAGNOSIS — R55 Syncope and collapse: Secondary | ICD-10-CM | POA: Diagnosis not present

## 2023-06-23 LAB — CBC
HCT: 41.9 % (ref 39.0–52.0)
Hemoglobin: 13.9 g/dL (ref 13.0–17.0)
MCH: 31.3 pg (ref 26.0–34.0)
MCHC: 33.2 g/dL (ref 30.0–36.0)
MCV: 94.4 fL (ref 80.0–100.0)
Platelets: 302 10*3/uL (ref 150–400)
RBC: 4.44 MIL/uL (ref 4.22–5.81)
RDW: 13.1 % (ref 11.5–15.5)
WBC: 6.7 10*3/uL (ref 4.0–10.5)
nRBC: 0 % (ref 0.0–0.2)

## 2023-06-23 LAB — TROPONIN I (HIGH SENSITIVITY): Troponin I (High Sensitivity): 4 ng/L (ref <18)

## 2023-06-23 LAB — COMPREHENSIVE METABOLIC PANEL WITH GFR
ALT: 35 U/L (ref 0–44)
AST: 36 U/L (ref 15–41)
Albumin: 3.9 g/dL (ref 3.5–5.0)
Alkaline Phosphatase: 98 U/L (ref 38–126)
Anion gap: 11 (ref 5–15)
BUN: 20 mg/dL (ref 6–20)
CO2: 25 mmol/L (ref 22–32)
Calcium: 9.2 mg/dL (ref 8.9–10.3)
Chloride: 99 mmol/L (ref 98–111)
Creatinine, Ser: 1.04 mg/dL (ref 0.61–1.24)
GFR, Estimated: >60 mL/min (ref >60)
Glucose, Bld: 103 mg/dL — ABNORMAL HIGH (ref 70–99)
Potassium: 4 mmol/L (ref 3.5–5.1)
Sodium: 135 mmol/L (ref 135–145)
Total Bilirubin: 0.4 mg/dL (ref 0.0–1.2)
Total Protein: 7.4 g/dL (ref 6.5–8.1)

## 2023-06-23 LAB — PROTIME-INR
INR: 1 (ref 0.8–1.2)
Prothrombin Time: 13.1 s (ref 11.4–15.2)

## 2023-06-23 LAB — LIPASE, BLOOD: Lipase: 39 U/L (ref 11–51)

## 2023-06-23 NOTE — Discharge Instructions (Addendum)
 Please drink plenty of fluids.  Please follow-up with your doctor on Monday or Tuesday for recheck/reevaluation.  Return to the emergency department for any significant worsening of symptoms or any other symptom personally concerning to yourself.

## 2023-06-23 NOTE — ED Notes (Signed)
 Pt c/o left ear "fullness" x3 weeks and worsening fatigue and dizziness. Pt denies pain or drainage. Pt reports flushing ear multiple times with saline without relief. Pt denies photophobia, neck pain, SHOB, CP. Pt CAOX4.

## 2023-06-23 NOTE — ED Provider Notes (Signed)
 [facility] Provider Note    Event Date/Time   First MD Initiated Contact with Patient 06/23/23 1742     (approximate)  History   Chief Complaint: Ear Fullness  HPI  [name] is a 63 y.o. male with a past medical history of allergies, depression, hypertension, hyperlipidemia, chronic pancreatitis, mild cognitive impairment with memory loss, presents to the emergency department with generalized fatigue weakness feeling off balance with a fullness sensation to his left ear.  According to the patient for the past 3 weeks he has been experiencing a fullness sensation to the left ear has been feeling off balance and has been very weak feeling.  Patient denies any chest pain or shortness of breath no cough congestion or fever.  No abdominal pain.  Physical Exam   Triage Vital Signs: ED Triage Vitals  Encounter Vitals Group     BP 06/23/23 1613 (!) 138/94     Systolic BP Percentile --      Diastolic BP Percentile --      Pulse Rate 06/23/23 1734 89     Resp 06/23/23 1613 18     Temp 06/23/23 1613 98 F (36.7 C)     Temp Source 06/23/23 1613 Oral     SpO2 06/23/23 1613 98 %     Weight 06/23/23 1613 214 lb (97.1 kg)     Height 06/23/23 1613 6\' 1"  (1.854 m)     Head Circumference --      Peak Flow --      Pain Score 06/23/23 1613 0     Pain Loc --      Pain Education --      Exclude from Growth Chart --     Most recent vital signs: Vitals:   06/23/23 1800 06/23/23 1830  BP: (!) 138/90 139/86  Pulse: 89 86  Resp: 16 17  Temp:    SpO2: 100% 100%    General: Awake, no distress.  CV:  Good peripheral perfusion.  Regular rate and rhythm  Resp:  Normal effort.  Equal breath sounds bilaterally.  Abd:  No distention.  Soft, nontender.  No rebound or guarding. Other:  Equal grip strength bilaterally.  No pronator drift.  5/5 motor in all extremities.  No cranial nerve deficits.  Normal tympanic membranes.   ED Results / Procedures / Treatments    RADIOLOGY  I have reviewed interpret the CT head images.  No abnormality seen on my evaluation. Radiology is read the CT scan as negative besides a small left mastoid effusion.   MEDICATIONS ORDERED IN ED: Medications - No data to display   IMPRESSION / MDM / ASSESSMENT AND PLAN / ED COURSE  I reviewed the triage vital signs and the nursing notes.  Patient's presentation is most consistent with acute presentation with potential threat to life or bodily function.  Patient presents to the emergency department with complaints of generalized fatigue and feeling off balance for the last 3 weeks he also states some fullness sensation to his left ear.  Normal tympanic membranes on examination, reassuring physical exam reassuring vital signs.  CT scan does show small left mastoid effusion.  Patient has a normal tympanic membrane no sign of otitis media or externa.  Patient has no mastoid tenderness or erythema.  Patient's lab work today is reassuring with a normal CBC, normal chemistry, normal lipase and reassuringly a negative troponin.  CT scan of the head shows no acute finding however given the patient's off-balance  sensation with significant fatigue and weakness we will obtain an MRI of the brain to rule out acute CVA.  Patient agreeable to plan of care.  If the patient's MRI is negative anticipate likely discharge home with supportive care and outpatient follow-up.  Patient's lab work is reassuring with a normal CBC, normal chemistry, normal lipase.  Troponin is negative as well.  The MRI that we have performed shows no significant finding.  Given the patient's reassuring workup we will discharge patient home with outpatient follow-up.  Patient agreeable to plan of care.  FINAL CLINICAL IMPRESSION(S) / ED DIAGNOSES   Weakness Fatigue   Note:  This document was prepared using Dragon voice recognition software and may include unintentional dictation errors.   Ruth Cove,  MD 06/23/23 2104

## 2023-06-23 NOTE — ED Provider Triage Note (Signed)
 Emergency Medicine Provider Triage Evaluation Note  [name], a 63 y.o. male  was evaluated in triage.  Pt complains of left ear fullness and pain with associated left facial numbness.  Patient with a history of HLD, depression, CAD, HTN, chronic back pain, presents to the ED.  He would endorse 3 weeks of symptoms and is also endorsing some general fatigue.  He presents with a steady gait and denies any frank weakness.  Initially presented to [facility] for evaluation, and was referred to the ED for further workup.  Review of Systems  Positive: Left facial paresthesias, left otalgia Negative: FCS  Physical Exam  BP (!) 138/94 (BP Location: Left Arm)   Temp 98 F (36.7 C) (Oral)   Resp 18   Ht 6\' 1"  (1.854 m)   Wt 97.1 kg   SpO2 98%   BMI 28.23 kg/m  Gen:   Awake, no distress  NAD Resp:  Normal effort  MSK:   Moves extremities without difficulty  Other:  L TM intact.   Medical Decision Making  Medically screening exam initiated at 4:44 PM.  Appropriate orders placed.  [name] was informed that the remainder of the evaluation will be completed by another provider, this initial triage assessment does not replace that evaluation, and the importance of remaining in the ED until their evaluation is complete.  Patient with a history of hypertension, HLD, CAD, presents to the ED endorsing left ear fullness as well as left facial paresthesias for the last 3 weeks.   May Sparks, PA-C 06/23/23 [phone]

## 2023-06-23 NOTE — ED Triage Notes (Addendum)
 Pt arrived via POV from [facility]. Pt has been having left facial numbness with left ear pain. Pt sts that his ear feels clogged and not able to get it unclogged. Pt sts that he is also very fatigued all the time. Pt does ambulate with a steady gait. Pt also endorses left side vision.

## 2023-06-27 ENCOUNTER — Encounter: Payer: Self-pay | Admitting: Internal Medicine

## 2023-06-27 DIAGNOSIS — L720 Epidermal cyst: Secondary | ICD-10-CM | POA: Diagnosis not present

## 2023-06-27 DIAGNOSIS — L918 Other hypertrophic disorders of the skin: Secondary | ICD-10-CM | POA: Diagnosis not present

## 2023-06-27 DIAGNOSIS — R2242 Localized swelling, mass and lump, left lower limb: Secondary | ICD-10-CM | POA: Diagnosis not present

## 2023-06-27 DIAGNOSIS — Z79899 Other long term (current) drug therapy: Secondary | ICD-10-CM | POA: Diagnosis not present

## 2023-06-27 DIAGNOSIS — L659 Nonscarring hair loss, unspecified: Secondary | ICD-10-CM | POA: Diagnosis not present

## 2023-06-28 NOTE — Telephone Encounter (Signed)
 Please see me before calling him. Please call and notify - I reviewed MRI. MRI revealed no acute abnormality. Appears to have changes c/w a previous small infarct. Need to confirm he is taking aspirin . If he is not taking, please confirm he has not problem taking and no allergy to aspirin . If no problem with aspirin , start ECASA 81mg  q day. Also, I would like to have him see neurology. If he is already seeing neurology, he needs a f/u appt. If he does not see a neurologist, will need to place order for referral.

## 2023-06-29 NOTE — Telephone Encounter (Signed)
 Patient aware of below.

## 2023-07-03 DIAGNOSIS — M19011 Primary osteoarthritis, right shoulder: Secondary | ICD-10-CM | POA: Diagnosis not present

## 2023-07-03 DIAGNOSIS — M7918 Myalgia, other site: Secondary | ICD-10-CM | POA: Diagnosis not present

## 2023-07-03 DIAGNOSIS — M19022 Primary osteoarthritis, left elbow: Secondary | ICD-10-CM | POA: Diagnosis not present

## 2023-07-03 DIAGNOSIS — M5136 Other intervertebral disc degeneration, lumbar region with discogenic back pain only: Secondary | ICD-10-CM | POA: Diagnosis not present

## 2023-07-03 DIAGNOSIS — Z79899 Other long term (current) drug therapy: Secondary | ICD-10-CM | POA: Diagnosis not present

## 2023-07-03 DIAGNOSIS — M47816 Spondylosis without myelopathy or radiculopathy, lumbar region: Secondary | ICD-10-CM | POA: Diagnosis not present

## 2023-07-03 DIAGNOSIS — M5030 Other cervical disc degeneration, unspecified cervical region: Secondary | ICD-10-CM | POA: Diagnosis not present

## 2023-07-03 DIAGNOSIS — J811 Chronic pulmonary edema: Secondary | ICD-10-CM | POA: Diagnosis not present

## 2023-07-03 DIAGNOSIS — M19012 Primary osteoarthritis, left shoulder: Secondary | ICD-10-CM | POA: Diagnosis not present

## 2023-07-03 DIAGNOSIS — G894 Chronic pain syndrome: Secondary | ICD-10-CM | POA: Diagnosis not present

## 2023-07-03 DIAGNOSIS — M170 Bilateral primary osteoarthritis of knee: Secondary | ICD-10-CM | POA: Diagnosis not present

## 2023-07-03 DIAGNOSIS — M0579 Rheumatoid arthritis with rheumatoid factor of multiple sites without organ or systems involvement: Secondary | ICD-10-CM | POA: Diagnosis not present

## 2023-07-03 DIAGNOSIS — J90 Pleural effusion, not elsewhere classified: Secondary | ICD-10-CM | POA: Diagnosis not present

## 2023-07-03 DIAGNOSIS — Z8261 Family history of arthritis: Secondary | ICD-10-CM | POA: Diagnosis not present

## 2023-07-05 DIAGNOSIS — M79675 Pain in left toe(s): Secondary | ICD-10-CM | POA: Diagnosis not present

## 2023-07-05 DIAGNOSIS — M65979 Unspecified synovitis and tenosynovitis, unspecified ankle and foot: Secondary | ICD-10-CM | POA: Diagnosis not present

## 2023-07-05 DIAGNOSIS — M2042 Other hammer toe(s) (acquired), left foot: Secondary | ICD-10-CM | POA: Diagnosis not present

## 2023-07-05 DIAGNOSIS — M2041 Other hammer toe(s) (acquired), right foot: Secondary | ICD-10-CM | POA: Diagnosis not present

## 2023-07-05 DIAGNOSIS — M6740 Ganglion, unspecified site: Secondary | ICD-10-CM | POA: Diagnosis not present

## 2023-07-07 ENCOUNTER — Other Ambulatory Visit: Payer: Self-pay | Admitting: Internal Medicine

## 2023-07-14 ENCOUNTER — Other Ambulatory Visit: Payer: Self-pay | Admitting: Internal Medicine

## 2023-07-19 ENCOUNTER — Encounter: Payer: Medicare HMO | Admitting: Internal Medicine

## 2023-07-19 DIAGNOSIS — Z1331 Encounter for screening for depression: Secondary | ICD-10-CM | POA: Diagnosis not present

## 2023-07-19 DIAGNOSIS — R4189 Other symptoms and signs involving cognitive functions and awareness: Secondary | ICD-10-CM | POA: Diagnosis not present

## 2023-07-19 DIAGNOSIS — G8929 Other chronic pain: Secondary | ICD-10-CM | POA: Diagnosis not present

## 2023-07-19 DIAGNOSIS — M545 Low back pain, unspecified: Secondary | ICD-10-CM | POA: Diagnosis not present

## 2023-07-19 DIAGNOSIS — R519 Headache, unspecified: Secondary | ICD-10-CM | POA: Diagnosis not present

## 2023-07-19 DIAGNOSIS — R202 Paresthesia of skin: Secondary | ICD-10-CM | POA: Diagnosis not present

## 2023-07-21 DIAGNOSIS — M6740 Ganglion, unspecified site: Secondary | ICD-10-CM | POA: Diagnosis not present

## 2023-07-21 DIAGNOSIS — M25531 Pain in right wrist: Secondary | ICD-10-CM | POA: Diagnosis not present

## 2023-07-21 DIAGNOSIS — M059 Rheumatoid arthritis with rheumatoid factor, unspecified: Secondary | ICD-10-CM | POA: Diagnosis not present

## 2023-07-27 ENCOUNTER — Ambulatory Visit (INDEPENDENT_AMBULATORY_CARE_PROVIDER_SITE_OTHER): Admitting: Internal Medicine

## 2023-07-27 ENCOUNTER — Encounter: Payer: Self-pay | Admitting: Internal Medicine

## 2023-07-27 ENCOUNTER — Ambulatory Visit: Payer: Self-pay | Admitting: Internal Medicine

## 2023-07-27 VITALS — BP 120/74 | HR 75 | Temp 98.2°F | Resp 16 | Ht 73.0 in | Wt 215.0 lb

## 2023-07-27 DIAGNOSIS — E7800 Pure hypercholesterolemia, unspecified: Secondary | ICD-10-CM

## 2023-07-27 DIAGNOSIS — F320 Major depressive disorder, single episode, mild: Secondary | ICD-10-CM

## 2023-07-27 DIAGNOSIS — R5383 Other fatigue: Secondary | ICD-10-CM

## 2023-07-27 DIAGNOSIS — I77819 Aortic ectasia, unspecified site: Secondary | ICD-10-CM

## 2023-07-27 DIAGNOSIS — Z8719 Personal history of other diseases of the digestive system: Secondary | ICD-10-CM | POA: Diagnosis not present

## 2023-07-27 DIAGNOSIS — M069 Rheumatoid arthritis, unspecified: Secondary | ICD-10-CM

## 2023-07-27 DIAGNOSIS — R519 Headache, unspecified: Secondary | ICD-10-CM

## 2023-07-27 DIAGNOSIS — Z8673 Personal history of transient ischemic attack (TIA), and cerebral infarction without residual deficits: Secondary | ICD-10-CM

## 2023-07-27 DIAGNOSIS — Z0184 Encounter for antibody response examination: Secondary | ICD-10-CM | POA: Diagnosis not present

## 2023-07-27 DIAGNOSIS — Z0000 Encounter for general adult medical examination without abnormal findings: Secondary | ICD-10-CM | POA: Diagnosis not present

## 2023-07-27 DIAGNOSIS — Z7185 Encounter for immunization safety counseling: Secondary | ICD-10-CM

## 2023-07-27 DIAGNOSIS — I10 Essential (primary) hypertension: Secondary | ICD-10-CM

## 2023-07-27 DIAGNOSIS — E041 Nontoxic single thyroid nodule: Secondary | ICD-10-CM

## 2023-07-27 LAB — TSH: TSH: 1.69 u[IU]/mL (ref 0.35–5.50)

## 2023-07-27 LAB — BASIC METABOLIC PANEL WITH GFR
BUN: 19 mg/dL (ref 6–23)
CO2: 27 meq/L (ref 19–32)
Calcium: 9.5 mg/dL (ref 8.4–10.5)
Chloride: 103 meq/L (ref 96–112)
Creatinine, Ser: 1.04 mg/dL (ref 0.40–1.50)
GFR: 77.81 mL/min (ref >60.00)
Glucose, Bld: 95 mg/dL (ref 70–99)
Potassium: 4.4 meq/L (ref 3.5–5.1)
Sodium: 138 meq/L (ref 135–145)

## 2023-07-27 LAB — HEPATIC FUNCTION PANEL
ALT: 48 U/L (ref 0–53)
AST: 29 U/L (ref 0–37)
Albumin: 4.5 g/dL (ref 3.5–5.2)
Alkaline Phosphatase: 129 U/L — ABNORMAL HIGH (ref 39–117)
Bilirubin, Direct: 0.1 mg/dL (ref 0.0–0.3)
Total Bilirubin: 0.3 mg/dL (ref 0.2–1.2)
Total Protein: 7.6 g/dL (ref 6.0–8.3)

## 2023-07-27 LAB — LIPID PANEL
Cholesterol: 127 mg/dL (ref 0–200)
HDL: 55 mg/dL (ref >39.00)
LDL Cholesterol: 49 mg/dL (ref 0–99)
NonHDL: 72.32
Total CHOL/HDL Ratio: 2
Triglycerides: 115 mg/dL (ref 0.0–149.0)
VLDL: 23 mg/dL (ref 0.0–40.0)

## 2023-07-27 LAB — LIPASE: Lipase: 38 U/L (ref 11.0–59.0)

## 2023-07-27 NOTE — Assessment & Plan Note (Signed)
 Has been followed by Dr Vincente.  Stable.

## 2023-07-27 NOTE — Assessment & Plan Note (Signed)
 Request MMR to check immunity.

## 2023-07-27 NOTE — Assessment & Plan Note (Signed)
 Reports increased fatigue. Recent cbc wnl. Check metabolic panel and thyroid . He request testosterone  to be checked.

## 2023-07-27 NOTE — Assessment & Plan Note (Signed)
 Better. Seeing neurology as outlined.

## 2023-07-27 NOTE — Progress Notes (Signed)
 Subjective:    Patient ID: [name], male    DOB: 01/12/1961, 63 y.o.   MRN: [id number]  Patient here for  Chief Complaint  Patient presents with   Annual Exam    HPI Here for a physical exam. Saw rheumatology 07/21/23 - follow up regarding RA on humira. Doing better on weekly dosing. Mucoid cyst on left foot. Saw neurology 07/19/23 - follow up - persistent headaches. Recommended to increase nortriptyline to 40mg  q hs x 1 week and then to 50mg  q hs. Also recommended continuing lyrica. His headaches are doing better. He has not had a headaches since being on the 50mg  dose. Referred to [facility] Specialist (ortho) - to f/u on wrist. No chest pain or sob reported. Exercising. Swimming. No abdominal pain or bowel change. Discussed immunizations. Wants MMR checked. Some fatigue. Would like testosterone  level checked.    Past Medical History:  Diagnosis Date   Allergy    Anoxic brain injury [facility])    Arthritis    Awareness under anesthesia    need a lot to put me under   Chronic back pain    s/p 5 back surgeries   Coronary artery disease    Cardiac catheterization in 2007 at [facility] showed 30% stenosis in proximal LAD. No other disease. Ejection fraction was 65%   Depression    H/O acute pancreatitis    Hypercholesterolemia    Hypertension    Mild cognitive impairment with memory loss    Pancreatitis    Past Surgical History:  Procedure Laterality Date   abdomial surgery     BACK SURGERY     multiple back surgeries (5)   CARDIAC CATHETERIZATION     [facility]    CHOLECYSTECTOMY  2013   COLONOSCOPY N/A 03/05/2021   Procedure: COLONOSCOPY;  Surgeon: Onita Elspeth Sharper, DO;  Location: [facility] ENDOSCOPY;  Service: Gastroenterology;  Laterality: N/A;  IDDM   COLONOSCOPY WITH PROPOFOL  N/A 06/30/2015   Procedure: COLONOSCOPY WITH PROPOFOL ;  Surgeon: Gladis RAYMOND Mariner, MD;  Location: [facility] ENDOSCOPY;  Service: Endoscopy;  Laterality: N/A;   ESOPHAGOGASTRODUODENOSCOPY (EGD) WITH PROPOFOL  N/A  03/30/2016   Procedure: ESOPHAGOGASTRODUODENOSCOPY (EGD) WITH PROPOFOL ;  Surgeon: Lamar ONEIDA Holmes, MD;  Location: [facility] ENDOSCOPY;  Service: Endoscopy;  Laterality: N/A;   FUNCTIONAL ENDOSCOPIC SINUS SURGERY     JOINT REPLACEMENT     LAMINECTOMY     LUMBAR SPINE SURGERY     SHOULDER ARTHROSCOPY WITH ROTATOR CUFF REPAIR AND OPEN BICEPS TENODESIS Right    SHOULDER ARTHROSCOPY WITH ROTATOR CUFF REPAIR AND OPEN BICEPS TENODESIS     SPINE SURGERY     TRIGGER POINT INJECTION     VASECTOMY     With Reversal   VEIN SURGERY Right 2013   arm   Family History  Problem Relation Age of Onset   Heart disease Father    Heart attack Father    Heart disease Mother    Heart attack Mother    Heart attack Brother        MI's x 2    Hypertension Other    Colon cancer Other    Hypertension Sister    Heart attack Paternal Grandfather    Hypertension Sister    Social History   Socioeconomic History   Marital status: Divorced    Spouse name: Not on file   Number of children: 3   Years of education: Not on file   Highest education level: Not on file  Occupational History  Not on file  Tobacco Use   Smoking status: Never   Smokeless tobacco: Never  Vaping Use   Vaping status: Never Used  Substance and Sexual Activity   Alcohol use: No    Alcohol/week: 0.0 standard drinks of alcohol   Drug use: No   Sexual activity: Yes  Other Topics Concern   Not on file  Social History Narrative   Not on file   Social Drivers of Health   Financial Resource Strain: Patient Declined (07/04/2023)   Received from [facility]   Overall Financial Resource Strain (CARDIA)    Difficulty of Paying Living Expenses: Patient declined  Recent Concern: Financial Resource Strain - High Risk (05/17/2023)   Received from [facility]   Overall Financial Resource Strain (CARDIA)    Difficulty of Paying Living Expenses: Hard  Food Insecurity: Patient Declined (07/04/2023)   Received  from [facility]   Hunger Vital Sign    Within the past 12 months, you worried that your food would run out before you got the money to buy more.: Patient declined    Within the past 12 months, the food you bought just didn't last and you didn't have money to get more.: Patient declined  Recent Concern: Food Insecurity - Food Insecurity Present (05/17/2023)   Received from [facility]   Hunger Vital Sign    Within the past 12 months, you worried that your food would run out before you got the money to buy more.: Often true    Within the past 12 months, the food you bought just didn't last and you didn't have money to get more.: Often true  Transportation Needs: Patient Declined (07/04/2023)   Received from [facility] - Transportation    In the past 12 months, has lack of transportation kept you from medical appointments or from getting medications?: Patient declined    Lack of Transportation (Non-Medical): Patient declined  Physical Activity: Not on file  Stress: No Stress Concern Present (12/29/2020)   [employer] of Occupational Health - Occupational Stress Questionnaire    Feeling of Stress : Not at all  Social Connections: Unknown (12/29/2020)   Social Connection and Isolation Panel    Frequency of Communication with Friends and Family: More than three times a week    Frequency of Social Gatherings with Friends and Family: More than three times a week    Attends Religious Services: Not on file    Active Member of Clubs or Organizations: Not on file    Attends Banker Meetings: Not on file    Marital Status: Not on file     Review of Systems  Constitutional:  Positive for fatigue. Negative for appetite change and unexpected weight change.  HENT:  Negative for congestion, sinus pressure and sore throat.   Eyes:  Negative for pain and visual disturbance.  Respiratory:  Negative for cough, chest  tightness and shortness of breath.   Cardiovascular:  Negative for chest pain, palpitations and leg swelling.  Gastrointestinal:  Negative for abdominal pain, diarrhea, nausea and vomiting.  Genitourinary:  Negative for difficulty urinating and dysuria.  Musculoskeletal:  Positive for back pain. Negative for joint swelling and myalgias.  Skin:  Negative for color change and rash.  Neurological:  Negative for dizziness and headaches.  Hematological:  Negative for adenopathy. Does not bruise/bleed easily.  Psychiatric/Behavioral:  Negative for agitation and dysphoric mood.  Objective:     BP 120/74   Pulse 75   Temp 98.2 F (36.8 C)   Resp 16   Ht 6' 1 (1.854 m)   Wt 215 lb (97.5 kg)   SpO2 98%   BMI 28.37 kg/m  Wt Readings from Last 3 Encounters:  07/27/23 215 lb (97.5 kg)  06/23/23 214 lb (97.1 kg)  03/27/23 205 lb 4 oz (93.1 kg)    Physical Exam Constitutional:      General: He is not in acute distress.    Appearance: Normal appearance. He is well-developed.  HENT:     Head: Normocephalic and atraumatic.     Right Ear: External ear normal.     Left Ear: External ear normal.     Mouth/Throat:     Pharynx: No oropharyngeal exudate or posterior oropharyngeal erythema.   Eyes:     General: No scleral icterus.       Right eye: No discharge.        Left eye: No discharge.     Conjunctiva/sclera: Conjunctivae normal.   Neck:     Thyroid : No thyromegaly.   Cardiovascular:     Rate and Rhythm: Normal rate and regular rhythm.  Pulmonary:     Effort: No respiratory distress.     Breath sounds: Normal breath sounds. No wheezing.  Abdominal:     General: Bowel sounds are normal.     Palpations: Abdomen is soft.     Tenderness: There is no abdominal tenderness.   Musculoskeletal:        General: No swelling or tenderness.     Cervical back: Neck supple. No tenderness.  Lymphadenopathy:     Cervical: No cervical adenopathy.   Skin:    Findings: No erythema  or rash.   Neurological:     Mental Status: He is alert and oriented to person, place, and time.   Psychiatric:        Mood and Affect: Mood normal.        Behavior: Behavior normal.         Outpatient Encounter Medications as of 07/27/2023  Medication Sig   adalimumab (HUMIRA) 40 MG/0.4ML pen Inject 40 mg into the skin every 14 (fourteen) days.   amLODipine  (NORVASC ) 5 MG tablet TAKE 1 TABLET BY MOUTH TWICE A DAY   atomoxetine  (STRATTERA ) 100 MG capsule Take 100 mg by mouth daily.   Azelastine  HCl 137 MCG/SPRAY SOLN PLACE 1 SPRAY INTO BOTH NOSTRILS 2 (TWO) TIMES DAILY. USE IN EACH NOSTRIL AS DIRECTED   desvenlafaxine (PRISTIQ) 100 MG 24 hr tablet Take 100 mg by mouth daily.   escitalopram (LEXAPRO) 10 MG tablet Take 10 mg by mouth daily.   gabapentin  (NEURONTIN ) 300 MG capsule Take 1 or 2 capsules by mouth TID   hydrOXYzine (ATARAX) 10 MG tablet TAKE ONE TAB AS NEEDED FOR ITCHING AT BEDTIME   ketoconazole (NIZORAL) 2 % shampoo Apply topically.   modafinil  (PROVIGIL ) 200 MG tablet Take 400 mg by mouth in the morning.   oxyCODONE  (OXY IR/ROXICODONE ) 5 MG immediate release tablet Take 5-10 mg by mouth at bedtime as needed for moderate pain.    rosuvastatin  (CRESTOR ) 5 MG tablet TAKE 1 TABLET (5 MG TOTAL) BY MOUTH DAILY.   sildenafil (REVATIO) 20 MG tablet TAKE 4 - 5 TABLETS BY MOUTH DAILY AS NEEDED   sucralfate  (CARAFATE ) 1 g tablet Take 1 g by mouth daily.    tamsulosin  (FLOMAX ) 0.4 MG CAPS capsule Take 0.4 mg by mouth  daily.   tiZANidine  (ZANAFLEX ) 4 MG tablet TAKE 1 - 2 TABLETS BY MOUTH THREE TIMES DAILY AS NEEDED   traZODone  (DESYREL ) 100 MG tablet TAKE 1 TABLET (100 MG TOTAL) BY MOUTH AT BEDTIME. DO NOT TAKE WITH OXYCODONE  OR MUSCLE RELAXER.   No facility-administered encounter medications on file as of 07/27/2023.     Lab Results  Component Value Date   WBC 6.7 06/23/2023   HGB 13.9 06/23/2023   HCT 41.9 06/23/2023   PLT 302 06/23/2023   GLUCOSE 103 (H) 06/23/2023   CHOL  123 03/21/2023   TRIG 139.0 03/21/2023   HDL 44.20 03/21/2023   LDLCALC 51 03/21/2023   ALT 35 06/23/2023   AST 36 06/23/2023   NA 135 06/23/2023   K 4.0 06/23/2023   CL 99 06/23/2023   CREATININE 1.04 06/23/2023   BUN 20 06/23/2023   CO2 25 06/23/2023   TSH 1.09 11/09/2022   PSA 0.53 11/02/2021   INR 1.0 06/23/2023   HGBA1C 5.6 09/23/2019    MR BRAIN WO CONTRAST Result Date: 06/23/2023 CLINICAL DATA:  Syncope/presyncope, cerebrovascular cause suspected EXAM: MRI HEAD WITHOUT CONTRAST TECHNIQUE: Multiplanar, multiecho pulse sequences of the brain and surrounding structures were obtained without intravenous contrast. COMPARISON:  CT head 09/16/2022. FINDINGS: Brain: No acute infarction, hemorrhage, hydrocephalus, extra-axial collection or mass lesion. Small remote right basal ganglia infarct. Vascular: Normal flow voids. Skull and upper cervical spine: Normal marrow signal. Sinuses/Orbits: Clear sinuses.  No acute orbital findings. IMPRESSION: No evidence of acute intracranial abnormality. Electronically Signed   By: Gilmore GORMAN Molt M.D.   On: 06/23/2023 20:31   CT Head Wo Contrast Result Date: 06/23/2023 CLINICAL DATA:  Facial numbness and left eye vision changes. EXAM: CT HEAD WITHOUT CONTRAST TECHNIQUE: Contiguous axial images were obtained from the base of the skull through the vertex without intravenous contrast. RADIATION DOSE REDUCTION: This exam was performed according to the departmental dose-optimization program which includes automated exposure control, adjustment of the mA and/or kV according to patient size and/or use of iterative reconstruction technique. COMPARISON:  CT head 09/16/2022. FINDINGS: Brain: No acute intracranial hemorrhage. No CT evidence of acute infarct. No edema, mass effect, or midline shift. The basilar cisterns are patent. Ventricles: The ventricles are normal. Vascular: No hyperdense vessel or unexpected calcification. Skull: No acute or aggressive finding.  Orbits: Orbits are symmetric. Sinuses: Mild mucosal thickening in the visualized ethmoid sinuses. Other: Small left mastoid effusion. IMPRESSION: No CT evidence of acute intracranial abnormality. Small left mastoid effusion. Electronically Signed   By: Donnice Mania M.D.   On: 06/23/2023 17:26       Assessment & Plan:  Routine general medical examination at a health care facility  Rheumatoid arthritis, involving unspecified site, unspecified whether rheumatoid factor present [facility]) Assessment & Plan: Being followed by rheumatology.  On humira.  Stable.   Orders: -     Basic metabolic panel with GFR  Hypercholesteremia Assessment & Plan: Low cholesterol diet and exercise.  Continue crestor .  Check lipid panel today.   Orders: -     Basic metabolic panel with GFR -     Hepatic function panel -     Lipid panel  Health care maintenance Assessment & Plan: Physical today 07/27/23.  Colonoscopy due 03/05/26.  PSA 06/27/23 - 1.11.    History of pancreatitis Assessment & Plan: No symptoms. Request lipase check today.   Orders: -     Lipase  Other fatigue Assessment & Plan: Reports increased fatigue. Recent cbc wnl.  Check metabolic panel and thyroid . He request testosterone  to be checked.   Orders: -     TSH -     Testosterone ,Free and Total  Immunity status testing -     Measles/Mumps/Rubella Immunity  Thyroid  nodule Assessment & Plan: Found on incidentally on CTA.  Discussed.  Had f/u thyroid  ultrasound 11/2022 - normal.    Immunization counseling Assessment & Plan: Request MMR to check immunity.    Primary hypertension Assessment & Plan: Continue amlodipine . Blood pressure as outlined. Blood pressure doing well. No changes in medication. Check metabolic panel.    Depression, major, single episode, mild (HCC) Assessment & Plan: Has been followed by Dr Vincente.  Stable.    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  Followed by cardiology.    Nonintractable headache, unspecified chronicity pattern, unspecified headache type Assessment & Plan: Better. Seeing neurology as outlined.    History of cerebral infarction Assessment & Plan: Remote history of right basal ganglia infarct. Seeing neurology. Continue risk factor modifcation. Blood pressure doing well. Continue crestor . Continue daily aspirin .       Allena Hamilton, MD

## 2023-07-27 NOTE — Assessment & Plan Note (Signed)
 Continue amlodipine . Blood pressure as outlined. Blood pressure doing well. No changes in medication. Check metabolic panel.

## 2023-07-27 NOTE — Assessment & Plan Note (Signed)
 Physical today 07/27/23.  Colonoscopy due 03/05/26.  PSA 06/27/23 - 1.11.

## 2023-07-27 NOTE — Assessment & Plan Note (Signed)
 Low cholesterol diet and exercise.  Continue crestor .  Check lipid panel today.

## 2023-07-27 NOTE — Assessment & Plan Note (Signed)
Aortic root ectasia - 4cm (CT chest - [facility] 03/31/2022).  Followed by cardiology.  

## 2023-07-27 NOTE — Assessment & Plan Note (Signed)
Being followed by rheumatology.  On humira.  Stable.  

## 2023-07-27 NOTE — Assessment & Plan Note (Signed)
 No symptoms. Request lipase check today.

## 2023-07-27 NOTE — Assessment & Plan Note (Signed)
 Found on incidentally on CTA.  Discussed.  Had f/u thyroid  ultrasound 11/2022 - normal.

## 2023-07-27 NOTE — Assessment & Plan Note (Signed)
 Remote history of right basal ganglia infarct. Seeing neurology. Continue risk factor modifcation. Blood pressure doing well. Continue crestor . Continue daily aspirin .

## 2023-07-28 LAB — MEASLES/MUMPS/RUBELLA IMMUNITY
Mumps IgG: 213 [AU]/ml
Rubella: 22.3 {index}
Rubeola IgG: 167 [AU]/ml

## 2023-07-29 LAB — TESTOSTERONE,FREE AND TOTAL
Testosterone, Free: 7.7 pg/mL (ref 6.6–18.1)
Testosterone: 504 ng/dL (ref 264–916)

## 2023-08-03 DIAGNOSIS — M6740 Ganglion, unspecified site: Secondary | ICD-10-CM | POA: Diagnosis not present

## 2023-08-03 DIAGNOSIS — M2041 Other hammer toe(s) (acquired), right foot: Secondary | ICD-10-CM | POA: Diagnosis not present

## 2023-08-03 DIAGNOSIS — M2042 Other hammer toe(s) (acquired), left foot: Secondary | ICD-10-CM | POA: Diagnosis not present

## 2023-08-24 ENCOUNTER — Other Ambulatory Visit: Payer: Self-pay | Admitting: Internal Medicine

## 2023-08-24 DIAGNOSIS — R1310 Dysphagia, unspecified: Secondary | ICD-10-CM | POA: Diagnosis not present

## 2023-08-24 DIAGNOSIS — K219 Gastro-esophageal reflux disease without esophagitis: Secondary | ICD-10-CM | POA: Diagnosis not present

## 2023-08-24 DIAGNOSIS — R110 Nausea: Secondary | ICD-10-CM | POA: Diagnosis not present

## 2023-08-25 ENCOUNTER — Encounter: Payer: Self-pay | Admitting: Internal Medicine

## 2023-08-25 DIAGNOSIS — R1310 Dysphagia, unspecified: Secondary | ICD-10-CM | POA: Insufficient documentation

## 2023-08-31 ENCOUNTER — Ambulatory Visit: Admitting: Anesthesiology

## 2023-08-31 ENCOUNTER — Other Ambulatory Visit: Payer: Self-pay

## 2023-08-31 ENCOUNTER — Encounter
Admission: RE | Disposition: A | Discharge status: Home / Self Care | Payer: Self-pay | Source: Home / Self Care | Attending: Gastroenterology

## 2023-08-31 ENCOUNTER — Encounter: Payer: Self-pay | Admitting: Gastroenterology

## 2023-08-31 ENCOUNTER — Ambulatory Visit
Admission: RE | Admit: 2023-08-31 | Discharge: 2023-08-31 | Disposition: A | Discharge status: Home / Self Care | Attending: Gastroenterology | Admitting: Gastroenterology

## 2023-08-31 DIAGNOSIS — K224 Dyskinesia of esophagus: Secondary | ICD-10-CM | POA: Diagnosis not present

## 2023-08-31 DIAGNOSIS — I10 Essential (primary) hypertension: Secondary | ICD-10-CM | POA: Diagnosis not present

## 2023-08-31 DIAGNOSIS — K222 Esophageal obstruction: Secondary | ICD-10-CM | POA: Diagnosis not present

## 2023-08-31 DIAGNOSIS — R1310 Dysphagia, unspecified: Secondary | ICD-10-CM | POA: Diagnosis not present

## 2023-08-31 DIAGNOSIS — R1013 Epigastric pain: Secondary | ICD-10-CM | POA: Insufficient documentation

## 2023-08-31 DIAGNOSIS — K3189 Other diseases of stomach and duodenum: Secondary | ICD-10-CM | POA: Diagnosis not present

## 2023-08-31 DIAGNOSIS — K449 Diaphragmatic hernia without obstruction or gangrene: Secondary | ICD-10-CM | POA: Diagnosis not present

## 2023-08-31 DIAGNOSIS — Q399 Congenital malformation of esophagus, unspecified: Secondary | ICD-10-CM | POA: Diagnosis not present

## 2023-08-31 DIAGNOSIS — I2510 Atherosclerotic heart disease of native coronary artery without angina pectoris: Secondary | ICD-10-CM | POA: Diagnosis not present

## 2023-08-31 DIAGNOSIS — Q398 Other congenital malformations of esophagus: Secondary | ICD-10-CM | POA: Insufficient documentation

## 2023-08-31 DIAGNOSIS — K219 Gastro-esophageal reflux disease without esophagitis: Secondary | ICD-10-CM | POA: Diagnosis not present

## 2023-08-31 DIAGNOSIS — K259 Gastric ulcer, unspecified as acute or chronic, without hemorrhage or perforation: Secondary | ICD-10-CM | POA: Insufficient documentation

## 2023-08-31 DIAGNOSIS — K319 Disease of stomach and duodenum, unspecified: Secondary | ICD-10-CM | POA: Diagnosis not present

## 2023-08-31 HISTORY — PX: ESOPHAGOGASTRODUODENOSCOPY: SHX5428

## 2023-08-31 SURGERY — EGD (ESOPHAGOGASTRODUODENOSCOPY)
Anesthesia: General

## 2023-08-31 MED ORDER — PROPOFOL 10 MG/ML IV BOLUS
INTRAVENOUS | Status: DC | PRN
  Administered 2023-08-31 (×2): 50 mg via INTRAVENOUS

## 2023-08-31 MED ORDER — PROPOFOL 1000 MG/100ML IV EMUL
INTRAVENOUS | Status: AC
  Filled 2023-08-31: qty 100

## 2023-08-31 MED ORDER — LIDOCAINE HCL (PF) 2 % IJ SOLN
INTRAMUSCULAR | Status: AC
  Filled 2023-08-31: qty 5

## 2023-08-31 MED ORDER — DEXMEDETOMIDINE HCL IN NACL 80 MCG/20ML IV SOLN
INTRAVENOUS | Status: DC | PRN
Start: 2023-08-31 — End: 2023-08-31
  Administered 2023-08-31: 8 ug via INTRAVENOUS
  Administered 2023-08-31: 12 ug via INTRAVENOUS

## 2023-08-31 MED ORDER — GLYCOPYRROLATE 0.2 MG/ML IJ SOLN
INTRAMUSCULAR | Status: AC
  Filled 2023-08-31: qty 1

## 2023-08-31 MED ORDER — ONDANSETRON HCL 4 MG/2ML IJ SOLN
4.0000 mg | Freq: Once | INTRAMUSCULAR | Status: AC
  Administered 2023-08-31: 4 mg via INTRAVENOUS

## 2023-08-31 MED ORDER — SODIUM BICARBONATE 8.4 % IV SOLN
INTRAVENOUS | Status: AC
  Filled 2023-08-31: qty 50

## 2023-08-31 MED ORDER — GLYCOPYRROLATE 0.2 MG/ML IJ SOLN
INTRAMUSCULAR | Status: DC | PRN
  Administered 2023-08-31: .2 mg via INTRAVENOUS

## 2023-08-31 MED ORDER — PROPOFOL 500 MG/50ML IV EMUL
INTRAVENOUS | Status: DC | PRN
  Administered 2023-08-31: 75 ug/kg/min via INTRAVENOUS

## 2023-08-31 MED ORDER — ONDANSETRON HCL 4 MG/2ML IJ SOLN
INTRAMUSCULAR | Status: AC
  Filled 2023-08-31: qty 2

## 2023-08-31 MED ORDER — SODIUM CHLORIDE 0.9 % IV SOLN: INTRAVENOUS | Status: DC

## 2023-08-31 MED ORDER — DEXMEDETOMIDINE HCL IN NACL 80 MCG/20ML IV SOLN
INTRAVENOUS | Status: AC
  Filled 2023-08-31: qty 20

## 2023-08-31 MED ORDER — LIDOCAINE HCL (CARDIAC) PF 100 MG/5ML IV SOSY
PREFILLED_SYRINGE | INTRAVENOUS | Status: DC | PRN
  Administered 2023-08-31: 60 mg via INTRAVENOUS

## 2023-08-31 NOTE — Transfer of Care (Signed)
 Immediate Anesthesia Transfer of Care Note  Patient: [name]  Procedure(s) Performed: EGD (ESOPHAGOGASTRODUODENOSCOPY) Balloon dilation wire-guided  Patient Location: PACU  Anesthesia Type:General  Level of Consciousness: sedated  Airway & Oxygen Therapy: Patient Spontanous Breathing  Post-op Assessment: Report given to RN and Post -op Vital signs reviewed and stable  Post vital signs: Reviewed and stable  Last Vitals:  Vitals Value Taken Time  BP 106/71 08/31/23 11:02  Temp    Pulse 83 08/31/23 11:03  Resp 15 08/31/23 11:03  SpO2 93 % 08/31/23 11:03  Vitals shown include unfiled device data.  Last Pain:  Vitals:   08/31/23 1100  TempSrc:   PainSc: Asleep         Complications: No notable events documented.

## 2023-08-31 NOTE — Op Note (Signed)
 [facility] Patient Name: [name] Procedure Date: 08/31/2023 10:42 AM MRN: [id number] Account #: [id number] Date of Birth: 08/07/1960 Admit Type: Outpatient Age: 63 Room: [facility] ENDO ROOM 1 Gender: Male Note Status: Finalized Instrument Name: Upper Endoscope [id number] Procedure:             Upper GI endoscopy Indications:           Epigastric abdominal pain, Dysphagia,                         Gastro-esophageal reflux disease Providers:             Elspeth Ozell Onita ROSALEA, DO Referring MD:          Allena Hamilton, MD (Referring MD) Medicines:             Monitored Anesthesia Care Complications:         No immediate complications. Estimated blood loss:                         Minimal. Procedure:             Pre-Anesthesia Assessment:                        - Prior to the procedure, a History and Physical was                         performed, and patient medications and allergies were                         reviewed. The patient is competent. The risks and                         benefits of the procedure and the sedation options and                         risks were discussed with the patient. All questions                         were answered and informed consent was obtained.                         Patient identification and proposed procedure were                         verified by the physician, the nurse, the anesthetist                         and the technician in the endoscopy suite. Mental                         Status Examination: alert and oriented. Airway                         Examination: normal oropharyngeal airway and neck                         mobility. Respiratory Examination: clear to  auscultation. CV Examination: RRR, no murmurs, no S3                         or S4. Prophylactic Antibiotics: The patient does not                         require prophylactic antibiotics. Prior                          Anticoagulants: The patient has taken no anticoagulant                         or antiplatelet agents. ASA Grade Assessment: III - A                         patient with severe systemic disease. After reviewing                         the risks and benefits, the patient was deemed in                         satisfactory condition to undergo the procedure. The                         anesthesia plan was to use monitored anesthesia care                         (MAC). Immediately prior to administration of                         medications, the patient was re-assessed for adequacy                         to receive sedatives. The heart rate, respiratory                         rate, oxygen saturations, blood pressure, adequacy of                         pulmonary ventilation, and response to care were                         monitored throughout the procedure. The physical                         status of the patient was re-assessed after the                         procedure.                        After obtaining informed consent, the endoscope was                         passed under direct vision. Throughout the procedure,                         the patient's blood pressure, pulse, and oxygen  saturations were monitored continuously. The Endoscope                         was introduced through the mouth, and advanced to the                         second part of duodenum. The upper GI endoscopy was                         accomplished without difficulty. The patient tolerated                         the procedure well. Findings:      The duodenal bulb, first portion of the duodenum and second portion of       the duodenum were normal. Estimated blood loss: none.      One non-bleeding cratered gastric ulcer with a clean ulcer base (Forrest       Class III) was found in the gastric antrum. The lesion was 2 mm in       largest dimension. Biopsies were taken  with a cold forceps for       histology. Estimated blood loss was minimal.      A small hiatal hernia was present. Estimated blood loss: none.      The exam of the stomach was otherwise normal.      Esophagogastric landmarks were identified: the gastroesophageal junction       was found at 40 cm from the incisors.      The Z-line was regular. Estimated blood loss: none.      Abnormal motility was noted in the esophagus. The cricopharyngeus was       normal. There is spasticity of the esophageal body. The distal       esophagus/lower esophageal sphincter is open. Estimated blood loss: none.      The distal esophagus was mildly tortuous. Estimated blood loss: none.      One benign-appearing, intrinsic mild (non-circumferential scarring)       stenosis was found. This stenosis measured 1.6 cm (inner diameter) x       less than one cm (in length). The stenosis was traversed. A TTS dilator       was passed through the scope. Dilation with a 15-16.5-18 mm balloon       dilator was performed to 15 mm, 16.5 mm and 18 mm. The dilation site was       examined following endoscope reinsertion and showed no change. Estimated       blood loss: none.      Normal mucosa was found in the entire esophagus. Estimated blood loss:       none.      The exam of the esophagus was otherwise normal. Impression:            - Normal duodenal bulb, first portion of the duodenum                         and second portion of the duodenum.                        - Non-bleeding gastric ulcer with a clean ulcer base                         (  Forrest Class III). Biopsied.                        - Small hiatal hernia.                        - Esophagogastric landmarks identified.                        - Z-line regular.                        - Abnormal esophageal motility, suspicious for                         esophageal spasm.                        - Tortuous esophagus.                        - Benign-appearing  esophageal stenosis. Dilated.                        - Normal mucosa was found in the entire esophagus. Recommendation:        - Patient has a contact number available for                         emergencies. The signs and symptoms of potential                         delayed complications were discussed with the patient.                         Return to normal activities tomorrow. Written                         discharge instructions were provided to the patient.                        - Discharge patient to home.                        - Resume previous diet.                        - Continue present medications.                        - Await pathology results.                        - Repeat upper endoscopy PRN for retreatment.                        - Return to referring physician as previously                         scheduled.                        - The findings and recommendations were discussed with  the patient. Procedure Code(s):     --- Professional ---                        [phone], Esophagogastroduodenoscopy, flexible,                         transoral; with transendoscopic balloon dilation of                         esophagus (less than 30 mm diameter)                        43239, 59, Esophagogastroduodenoscopy, flexible,                         transoral; with biopsy, single or multiple Diagnosis Code(s):     --- Professional ---                        K25.9, Gastric ulcer, unspecified as acute or chronic,                         without hemorrhage or perforation                        K44.9, Diaphragmatic hernia without obstruction or                         gangrene                        K22.4, Dyskinesia of esophagus                        Q39.9, Congenital malformation of esophagus,                         unspecified                        K22.2, Esophageal obstruction                        R10.13, Epigastric pain                         R13.10, Dysphagia, unspecified                        K21.9, Gastro-esophageal reflux disease without                         esophagitis CPT copyright 2022 American Medical Association. All rights reserved. The codes documented in this report are preliminary and upon coder review may  be revised to meet current compliance requirements. Attending Participation:      I personally performed the entire procedure. Elspeth Jungling, DO [name] DO, DO 08/31/2023 11:07:25 AM This report has been signed electronically. Number of Addenda: 0 Note Initiated On: 08/31/2023 10:42 AM Estimated Blood Loss:  Estimated blood loss was minimal.      [facility]

## 2023-08-31 NOTE — H&P (Signed)
 Pre-Procedure H&P   Patient ID: [name] is a 63 y.o. male.  Gastroenterology Provider: [name], DO  Referring Provider: Romero Antigua, PA PCP: Glendia Shad, MD  Date: 08/31/2023  HPI [name] is a 62 y.o. male who presents today for Esophagogastroduodenoscopy for GERD, dysphagia .  Patient was experiencing trouble swallowing liquids and pills while he was off of his PPI.  He also had worsening reflux at that point in time.  Nausea and epigastric pain were also symptoms.  Since restarting his PPI all of these have improved.  He has no issues with liquids or odynophagia. Hemoglobin 14 MCV 94 platelets 302,000 creatinine 1.0  EGDs performed in 2010 and 2018.  Gastritis was demonstrated negative for H. pylori on biopsy.  He does have a hiatal hernia and grade a esophagitis at that point in time.   Past Medical History:  Diagnosis Date   Allergy    Anoxic brain injury [facility])    Arthritis    Awareness under anesthesia    need a lot to put me under   Chronic back pain    s/p 5 back surgeries   Coronary artery disease    Cardiac catheterization in 2007 at [facility] showed 30% stenosis in proximal LAD. No other disease. Ejection fraction was 65%   Depression    H/O acute pancreatitis    Hypercholesterolemia    Hypertension    Mild cognitive impairment with memory loss    Pancreatitis     Past Surgical History:  Procedure Laterality Date   abdomial surgery     BACK SURGERY     multiple back surgeries (5)   CARDIAC CATHETERIZATION     [facility]    CHOLECYSTECTOMY  2013   COLONOSCOPY N/A 03/05/2021   Procedure: COLONOSCOPY;  Surgeon: Jungling Elspeth Ozell, DO;  Location: [facility] ENDOSCOPY;  Service: Gastroenterology;  Laterality: N/A;  IDDM   COLONOSCOPY WITH PROPOFOL  N/A 06/30/2015   Procedure: COLONOSCOPY WITH PROPOFOL ;  Surgeon: Gladis RAYMOND Mariner, MD;  Location: [facility] ENDOSCOPY;  Service: Endoscopy;  Laterality: N/A;   ESOPHAGOGASTRODUODENOSCOPY (EGD)  WITH PROPOFOL  N/A 03/30/2016   Procedure: ESOPHAGOGASTRODUODENOSCOPY (EGD) WITH PROPOFOL ;  Surgeon: Lamar ONEIDA Holmes, MD;  Location: [facility] ENDOSCOPY;  Service: Endoscopy;  Laterality: N/A;   FUNCTIONAL ENDOSCOPIC SINUS SURGERY     JOINT REPLACEMENT     LAMINECTOMY     LUMBAR SPINE SURGERY     SHOULDER ARTHROSCOPY WITH ROTATOR CUFF REPAIR AND OPEN BICEPS TENODESIS Right    SHOULDER ARTHROSCOPY WITH ROTATOR CUFF REPAIR AND OPEN BICEPS TENODESIS     SPINE SURGERY     TRIGGER POINT INJECTION     VASECTOMY     With Reversal   VEIN SURGERY Right 2013   arm    Family History Mother- colon polyps; paternal uncle crc No other h/o GI disease or malignancy  Review of Systems  Constitutional:  Negative for activity change, appetite change, chills, diaphoresis, fatigue, fever and unexpected weight change.  HENT:  Positive for trouble swallowing. Negative for voice change.   Respiratory:  Negative for shortness of breath and wheezing.   Cardiovascular:  Negative for chest pain, palpitations and leg swelling.  Gastrointestinal:  Positive for abdominal pain and nausea. Negative for abdominal distention, anal bleeding, blood in stool, constipation, diarrhea and vomiting.  Musculoskeletal:  Negative for arthralgias and myalgias.  Skin:  Negative for color change and pallor.  Neurological:  Negative for dizziness, syncope and weakness.  Psychiatric/Behavioral:  Negative for  confusion. The patient is not nervous/anxious.   All other systems reviewed and are negative.    Medications No current facility-administered medications on file prior to encounter.   Current Outpatient Medications on File Prior to Encounter  Medication Sig Dispense Refill   amLODipine  (NORVASC ) 5 MG tablet TAKE 1 TABLET BY MOUTH TWICE A DAY 180 tablet 1   atomoxetine  (STRATTERA ) 100 MG capsule Take 100 mg by mouth daily.     Azelastine  HCl 137 MCG/SPRAY SOLN PLACE 1 SPRAY INTO BOTH NOSTRILS 2 (TWO) TIMES DAILY. USE IN EACH  NOSTRIL AS DIRECTED 90 mL 1   desvenlafaxine (PRISTIQ) 100 MG 24 hr tablet Take 100 mg by mouth daily.     escitalopram (LEXAPRO) 10 MG tablet Take 10 mg by mouth daily.     modafinil  (PROVIGIL ) 200 MG tablet Take 400 mg by mouth in the morning.     oxyCODONE  (OXY IR/ROXICODONE ) 5 MG immediate release tablet Take 5-10 mg by mouth at bedtime as needed for moderate pain.   0   rosuvastatin  (CRESTOR ) 5 MG tablet TAKE 1 TABLET (5 MG TOTAL) BY MOUTH DAILY. 90 tablet 3   sucralfate  (CARAFATE ) 1 g tablet Take 1 g by mouth daily.   1   traZODone  (DESYREL ) 100 MG tablet TAKE 1 TABLET (100 MG TOTAL) BY MOUTH AT BEDTIME. DO NOT TAKE WITH OXYCODONE  OR MUSCLE RELAXER. 90 tablet 0   adalimumab (HUMIRA) 40 MG/0.4ML pen Inject 40 mg into the skin every 14 (fourteen) days.     gabapentin  (NEURONTIN ) 300 MG capsule Take 1 or 2 capsules by mouth TID (Patient not taking: Reported on 08/31/2023)     hydrOXYzine (ATARAX) 10 MG tablet TAKE ONE TAB AS NEEDED FOR ITCHING AT BEDTIME     ketoconazole (NIZORAL) 2 % shampoo Apply topically.     sildenafil (REVATIO) 20 MG tablet TAKE 4 - 5 TABLETS BY MOUTH DAILY AS NEEDED  3   tamsulosin  (FLOMAX ) 0.4 MG CAPS capsule Take 0.4 mg by mouth daily. (Patient not taking: Reported on 08/31/2023)     tiZANidine  (ZANAFLEX ) 4 MG tablet TAKE 1 - 2 TABLETS BY MOUTH THREE TIMES DAILY AS NEEDED      Pertinent medications related to GI and procedure were reviewed by me with the patient prior to the procedure   Current Facility-Administered Medications:    0.9 %  sodium chloride  infusion, , Intravenous, Continuous, Onita Elspeth Sharper, DO, Last Rate: 20 mL/hr at 08/31/23 1007, New Bag at 08/31/23 1007  sodium chloride  20 mL/hr at 08/31/23 1007       Allergies  Allergen Reactions   Corticosteroids Other (See Comments)    Pancreatitis   Penicillins Anaphylaxis    Has patient had a PCN reaction causing immediate rash, facial/tongue/throat swelling, SOB or lightheadedness with  hypotension: no Has patient had a PCN reaction causing severe rash involving mucus membranes or skin necrosis: yes Has patient had a PCN reaction that required hospitalization: yes Has patient had a PCN reaction occurring within the last 10 years: no If all of the above answers are NO, then may proceed with Cephalosporin use.    Sulfa Antibiotics Other (See Comments)    Unknown reaction   Sulfasalazine    Allergies were reviewed by me prior to the procedure  Objective   Body mass index is 28.37 kg/m. Vitals:   08/31/23 1004  BP: 127/75  Pulse: 99  Resp: 20  Temp: (!) 97.2 F (36.2 C)  TempSrc: Temporal  SpO2: 100%  Weight:  97.5 kg  Height: 6' 1 (1.854 m)     Physical Exam Vitals and nursing note reviewed.  Constitutional:      General: He is not in acute distress.    Appearance: Normal appearance. He is not ill-appearing, toxic-appearing or diaphoretic.  HENT:     Head: Normocephalic and atraumatic.     Nose: Nose normal.     Mouth/Throat:     Mouth: Mucous membranes are moist.     Pharynx: Oropharynx is clear.  Eyes:     General: No scleral icterus.    Extraocular Movements: Extraocular movements intact.  Cardiovascular:     Rate and Rhythm: Normal rate and regular rhythm.     Heart sounds: Normal heart sounds. No murmur heard.    No friction rub. No gallop.  Pulmonary:     Effort: Pulmonary effort is normal. No respiratory distress.     Breath sounds: Normal breath sounds. No wheezing, rhonchi or rales.  Abdominal:     General: Bowel sounds are normal. There is no distension.     Palpations: Abdomen is soft.     Tenderness: There is no abdominal tenderness. There is no guarding or rebound.  Musculoskeletal:     Cervical back: Neck supple.     Right lower leg: No edema.     Left lower leg: No edema.  Skin:    General: Skin is warm and dry.     Coloration: Skin is not jaundiced or pale.  Neurological:     General: No focal deficit present.     Mental  Status: He is alert and oriented to person, place, and time. Mental status is at baseline.  Psychiatric:        Mood and Affect: Mood normal.        Behavior: Behavior normal.        Thought Content: Thought content normal.        Judgment: Judgment normal.      Assessment:  [name] is a 63 y.o. male  who presents today for Esophagogastroduodenoscopy for GERD, dysphagia .  Plan:  Esophagogastroduodenoscopy with possible intervention today  Esophagogastroduodenoscopy with possible biopsy, control of bleeding, polypectomy, and interventions as necessary has been discussed with the patient/patient representative. Informed consent was obtained from the patient/patient representative after explaining the indication, nature, and risks of the procedure including but not limited to death, bleeding, perforation, missed neoplasm/lesions, cardiorespiratory compromise, and reaction to medications. Opportunity for questions was given and appropriate answers were provided. Patient/patient representative has verbalized understanding is amenable to undergoing the procedure.   [name], DO  [facility]astroenter[facility]  Portions of the record may have been created with voice recognition software. Occasional wrong-word or 'sound-a-like' substitutions may have occurred due to the inherent limitations of voice recognition software.  Read the chart carefully and recognize, using context, where substitutions may have occurred.

## 2023-08-31 NOTE — Anesthesia Preprocedure Evaluation (Addendum)
 Anesthesia Evaluation  Patient identified by MRN, date of birth, ID band Patient awake    Reviewed: Allergy & Precautions, NPO status , Patient's Chart, lab work & pertinent test results  Airway Mallampati: II  TM Distance: >3 FB Neck ROM: Full    Dental  (+) Teeth Intact   Pulmonary neg pulmonary ROS   Pulmonary exam normal        Cardiovascular Exercise Tolerance: Good hypertension, Pt. on medications + CAD and + DOE  Normal cardiovascular exam Rhythm:Regular Rate:Normal     Neuro/Psych  Headaches PSYCHIATRIC DISORDERS      negative neurological ROS  negative psych ROS   GI/Hepatic negative GI ROS, Neg liver ROS,,,  Endo/Other  negative endocrine ROS    Renal/GU negative Renal ROS  negative genitourinary   Musculoskeletal   Abdominal   Peds negative pediatric ROS (+)  Hematology negative hematology ROS (+) Blood dyscrasia   Anesthesia Other Findings Past Medical History: No date: Allergy No date: Anoxic brain injury (HCC) No date: Arthritis No date: Awareness under anesthesia     Comment:  need a lot to put me under No date: Chronic back pain     Comment:  s/p 5 back surgeries No date: Coronary artery disease     Comment:  Cardiac catheterization in 2007 at [facility] showed 30%               stenosis in proximal LAD. No other disease. Ejection               fraction was 65% No date: Depression No date: H/O acute pancreatitis No date: Hypercholesterolemia No date: Hypertension No date: Mild cognitive impairment with memory loss No date: Pancreatitis  Past Surgical History: No date: abdomial surgery No date: BACK SURGERY     Comment:  multiple back surgeries (5) No date: CARDIAC CATHETERIZATION     Comment:  [facility]  2013: CHOLECYSTECTOMY 03/05/2021: COLONOSCOPY; N/A     Comment:  Procedure: COLONOSCOPY;  Surgeon: Onita Elspeth Sharper,              DO;  Location: [facility] ENDOSCOPY;  Service:                Gastroenterology;  Laterality: N/A;  IDDM 06/30/2015: COLONOSCOPY WITH PROPOFOL ; N/A     Comment:  Procedure: COLONOSCOPY WITH PROPOFOL ;  Surgeon: Gladis RAYMOND Mariner, MD;  Location[facility] ENDOSCOPY;  Service:               Endoscopy;  Laterality: N/A; 03/30/2016: ESOPHAGOGASTRODUODENOSCOPY (EGD) WITH PROPOFOL ; N/A     Comment:  Procedure: ESOPHAGOGASTRODUODENOSCOPY (EGD) WITH               PROPOFOL ;  Surgeon: Lamar ONEIDA Holmes, MD;  Location: [facility]              ENDOSCOPY;  Service: Endoscopy;  Laterality: N/A; No date: FUNCTIONAL ENDOSCOPIC SINUS SURGERY No date: JOINT REPLACEMENT No date: LAMINECTOMY No date: LUMBAR SPINE SURGERY No date: SHOULDER ARTHROSCOPY WITH ROTATOR CUFF REPAIR AND OPEN  BICEPS TENODESIS; Right No date: SHOULDER ARTHROSCOPY WITH ROTATOR CUFF REPAIR AND OPEN  BICEPS TENODESIS No date: SPINE SURGERY No date: TRIGGER POINT INJECTION No date: VASECTOMY     Comment:  With Reversal 2013: VEIN SURGERY; Right     Comment:  arm  BMI    Body Mass Index: 28.37 kg/m      Reproductive/Obstetrics negative OB ROS  Anesthesia Physical Anesthesia Plan  ASA: 3  Anesthesia Plan: General   Post-op Pain Management:    Induction: Intravenous  PONV Risk Score and Plan: Propofol  infusion and TIVA  Airway Management Planned: Natural Airway and Nasal Cannula  Additional Equipment:   Intra-op Plan:   Post-operative Plan:   Informed Consent: I have reviewed the patients History and Physical, chart, labs and discussed the procedure including the risks, benefits and alternatives for the proposed anesthesia with the patient or authorized representative who has indicated his/her understanding and acceptance.     Dental Advisory Given  Plan Discussed with: CRNA  Anesthesia Plan Comments:          Anesthesia Quick Evaluation

## 2023-08-31 NOTE — Interval H&P Note (Signed)
 History and Physical Interval Note: Preprocedure H&P from 08/31/23  was reviewed and there was no interval change after seeing and examining the patient.  Written consent was obtained from the patient after discussion of risks, benefits, and alternatives. Patient has consented to proceed with Esophagogastroduodenoscopy with possible intervention   08/31/2023 10:42 AM  [name]  has presented today for surgery, with the diagnosis of R13.10 (ICD-10-CM) - Dysphagia, unspecified type K21.9 (ICD-10-CM) - Gastroesophageal reflux disease, unspecified whether esophagitis present.  The various methods of treatment have been discussed with the patient and family. After consideration of risks, benefits and other options for treatment, the patient has consented to  Procedure(s) with comments: EGD (ESOPHAGOGASTRODUODENOSCOPY) (N/A) - Requesting AM appt as a surgical intervention.  The patient's history has been reviewed, patient examined, no change in status, stable for surgery.  I have reviewed the patient's chart and labs.  Questions were answered to the patient's satisfaction.     [name]

## 2023-08-31 NOTE — Anesthesia Postprocedure Evaluation (Signed)
 Anesthesia Post Note  Patient: [name]  Procedure(s) Performed: EGD (ESOPHAGOGASTRODUODENOSCOPY) Balloon dilation wire-guided  Patient location during evaluation: PACU Anesthesia Type: General Level of consciousness: sedated Pain management: pain level controlled Vital Signs Assessment: post-procedure vital signs reviewed and stable Respiratory status: spontaneous breathing Cardiovascular status: stable Anesthetic complications: no   No notable events documented.   Last Vitals:  Vitals:   08/31/23 1148 08/31/23 1200  BP: 119/88 121/84  Pulse: 73 71  Resp: 18 (!) 23  Temp:    SpO2: 97% 98%    Last Pain:  Vitals:   08/31/23 1200  TempSrc:   PainSc: 0-No pain                 VAN STAVEREN,Eisa Necaise

## 2023-09-04 LAB — SURGICAL PATHOLOGY

## 2023-09-06 DIAGNOSIS — M069 Rheumatoid arthritis, unspecified: Secondary | ICD-10-CM | POA: Diagnosis not present

## 2023-09-06 DIAGNOSIS — M47812 Spondylosis without myelopathy or radiculopathy, cervical region: Secondary | ICD-10-CM | POA: Diagnosis not present

## 2023-09-06 DIAGNOSIS — M1811 Unilateral primary osteoarthritis of first carpometacarpal joint, right hand: Secondary | ICD-10-CM | POA: Diagnosis not present

## 2023-09-06 DIAGNOSIS — J90 Pleural effusion, not elsewhere classified: Secondary | ICD-10-CM | POA: Diagnosis not present

## 2023-09-06 DIAGNOSIS — M47816 Spondylosis without myelopathy or radiculopathy, lumbar region: Secondary | ICD-10-CM | POA: Diagnosis not present

## 2023-09-06 DIAGNOSIS — M170 Bilateral primary osteoarthritis of knee: Secondary | ICD-10-CM | POA: Diagnosis not present

## 2023-09-06 DIAGNOSIS — M19012 Primary osteoarthritis, left shoulder: Secondary | ICD-10-CM | POA: Diagnosis not present

## 2023-09-06 DIAGNOSIS — M19011 Primary osteoarthritis, right shoulder: Secondary | ICD-10-CM | POA: Diagnosis not present

## 2023-09-06 DIAGNOSIS — M19022 Primary osteoarthritis, left elbow: Secondary | ICD-10-CM | POA: Diagnosis not present

## 2023-09-06 DIAGNOSIS — M4802 Spinal stenosis, cervical region: Secondary | ICD-10-CM | POA: Diagnosis not present

## 2023-09-06 DIAGNOSIS — G894 Chronic pain syndrome: Secondary | ICD-10-CM | POA: Diagnosis not present

## 2023-09-06 DIAGNOSIS — M7918 Myalgia, other site: Secondary | ICD-10-CM | POA: Diagnosis not present

## 2023-09-06 DIAGNOSIS — J811 Chronic pulmonary edema: Secondary | ICD-10-CM | POA: Diagnosis not present

## 2023-09-06 DIAGNOSIS — M51361 Other intervertebral disc degeneration, lumbar region with lower extremity pain only: Secondary | ICD-10-CM | POA: Diagnosis not present

## 2023-09-06 DIAGNOSIS — K861 Other chronic pancreatitis: Secondary | ICD-10-CM | POA: Diagnosis not present

## 2023-09-06 DIAGNOSIS — Z79899 Other long term (current) drug therapy: Secondary | ICD-10-CM | POA: Diagnosis not present

## 2023-09-06 DIAGNOSIS — Z981 Arthrodesis status: Secondary | ICD-10-CM | POA: Diagnosis not present

## 2023-09-06 DIAGNOSIS — M25531 Pain in right wrist: Secondary | ICD-10-CM | POA: Diagnosis not present

## 2023-09-06 DIAGNOSIS — M5136 Other intervertebral disc degeneration, lumbar region with discogenic back pain only: Secondary | ICD-10-CM | POA: Diagnosis not present

## 2023-09-18 DIAGNOSIS — Z9889 Other specified postprocedural states: Secondary | ICD-10-CM | POA: Diagnosis not present

## 2023-09-18 DIAGNOSIS — R000 Tachycardia, unspecified: Secondary | ICD-10-CM | POA: Diagnosis not present

## 2023-09-18 DIAGNOSIS — E7800 Pure hypercholesterolemia, unspecified: Secondary | ICD-10-CM | POA: Diagnosis not present

## 2023-09-18 DIAGNOSIS — R002 Palpitations: Secondary | ICD-10-CM | POA: Diagnosis not present

## 2023-09-18 DIAGNOSIS — R0602 Shortness of breath: Secondary | ICD-10-CM | POA: Diagnosis not present

## 2023-09-18 DIAGNOSIS — R55 Syncope and collapse: Secondary | ICD-10-CM | POA: Diagnosis not present

## 2023-09-19 DIAGNOSIS — G8929 Other chronic pain: Secondary | ICD-10-CM | POA: Diagnosis not present

## 2023-09-19 DIAGNOSIS — R4189 Other symptoms and signs involving cognitive functions and awareness: Secondary | ICD-10-CM | POA: Diagnosis not present

## 2023-09-19 DIAGNOSIS — R519 Headache, unspecified: Secondary | ICD-10-CM | POA: Diagnosis not present

## 2023-09-19 DIAGNOSIS — R202 Paresthesia of skin: Secondary | ICD-10-CM | POA: Diagnosis not present

## 2023-09-19 DIAGNOSIS — M545 Low back pain, unspecified: Secondary | ICD-10-CM | POA: Diagnosis not present

## 2023-09-20 DIAGNOSIS — M7711 Lateral epicondylitis, right elbow: Secondary | ICD-10-CM | POA: Diagnosis not present

## 2023-09-20 DIAGNOSIS — W540XXA Bitten by dog, initial encounter: Secondary | ICD-10-CM | POA: Diagnosis not present

## 2023-09-20 DIAGNOSIS — S638X1A Sprain of other part of right wrist and hand, initial encounter: Secondary | ICD-10-CM | POA: Diagnosis not present

## 2023-09-21 ENCOUNTER — Encounter: Payer: Self-pay | Admitting: Internal Medicine

## 2023-09-21 DIAGNOSIS — M25531 Pain in right wrist: Secondary | ICD-10-CM | POA: Insufficient documentation

## 2023-09-28 DIAGNOSIS — S63391A Traumatic rupture of other ligament of right wrist, initial encounter: Secondary | ICD-10-CM | POA: Diagnosis not present

## 2023-09-28 DIAGNOSIS — W540XXA Bitten by dog, initial encounter: Secondary | ICD-10-CM | POA: Diagnosis not present

## 2023-09-28 DIAGNOSIS — X58XXXA Exposure to other specified factors, initial encounter: Secondary | ICD-10-CM | POA: Diagnosis not present

## 2023-09-28 DIAGNOSIS — W540XXD Bitten by dog, subsequent encounter: Secondary | ICD-10-CM | POA: Diagnosis not present

## 2023-09-28 DIAGNOSIS — M67833 Other specified disorders of tendon, right wrist: Secondary | ICD-10-CM | POA: Diagnosis not present

## 2023-09-28 DIAGNOSIS — S63391D Traumatic rupture of other ligament of right wrist, subsequent encounter: Secondary | ICD-10-CM | POA: Diagnosis not present

## 2023-09-28 DIAGNOSIS — S63591S Other specified sprain of right wrist, sequela: Secondary | ICD-10-CM | POA: Diagnosis not present

## 2023-09-28 DIAGNOSIS — S63501A Unspecified sprain of right wrist, initial encounter: Secondary | ICD-10-CM | POA: Diagnosis not present

## 2023-10-06 DIAGNOSIS — M25331 Other instability, right wrist: Secondary | ICD-10-CM | POA: Diagnosis not present

## 2023-10-06 DIAGNOSIS — M7711 Lateral epicondylitis, right elbow: Secondary | ICD-10-CM | POA: Diagnosis not present

## 2023-10-10 DIAGNOSIS — M25331 Other instability, right wrist: Secondary | ICD-10-CM | POA: Diagnosis not present

## 2023-10-10 DIAGNOSIS — I2510 Atherosclerotic heart disease of native coronary artery without angina pectoris: Secondary | ICD-10-CM | POA: Diagnosis not present

## 2023-10-10 DIAGNOSIS — K219 Gastro-esophageal reflux disease without esophagitis: Secondary | ICD-10-CM | POA: Diagnosis not present

## 2023-10-10 DIAGNOSIS — M059 Rheumatoid arthritis with rheumatoid factor, unspecified: Secondary | ICD-10-CM | POA: Diagnosis not present

## 2023-10-10 DIAGNOSIS — G8918 Other acute postprocedural pain: Secondary | ICD-10-CM | POA: Diagnosis not present

## 2023-10-10 DIAGNOSIS — I10 Essential (primary) hypertension: Secondary | ICD-10-CM | POA: Diagnosis not present

## 2023-10-14 ENCOUNTER — Other Ambulatory Visit: Payer: Self-pay | Admitting: Internal Medicine

## 2023-11-06 DIAGNOSIS — Z4789 Encounter for other orthopedic aftercare: Secondary | ICD-10-CM | POA: Diagnosis not present

## 2023-11-06 DIAGNOSIS — M79641 Pain in right hand: Secondary | ICD-10-CM | POA: Diagnosis not present

## 2023-11-06 DIAGNOSIS — M79631 Pain in right forearm: Secondary | ICD-10-CM | POA: Diagnosis not present

## 2023-11-06 DIAGNOSIS — M25641 Stiffness of right hand, not elsewhere classified: Secondary | ICD-10-CM | POA: Diagnosis not present

## 2023-11-06 DIAGNOSIS — M25531 Pain in right wrist: Secondary | ICD-10-CM | POA: Diagnosis not present

## 2023-11-07 ENCOUNTER — Other Ambulatory Visit: Payer: Self-pay | Admitting: Internal Medicine

## 2023-11-11 NOTE — Telephone Encounter (Signed)
 Rx ok'd for 90 day rx for astelin .

## 2023-11-15 DIAGNOSIS — M51361 Other intervertebral disc degeneration, lumbar region with lower extremity pain only: Secondary | ICD-10-CM | POA: Diagnosis not present

## 2023-11-15 DIAGNOSIS — M4802 Spinal stenosis, cervical region: Secondary | ICD-10-CM | POA: Diagnosis not present

## 2023-11-15 DIAGNOSIS — Z79899 Other long term (current) drug therapy: Secondary | ICD-10-CM | POA: Diagnosis not present

## 2023-11-15 DIAGNOSIS — M059 Rheumatoid arthritis with rheumatoid factor, unspecified: Secondary | ICD-10-CM | POA: Diagnosis not present

## 2023-11-15 DIAGNOSIS — M7918 Myalgia, other site: Secondary | ICD-10-CM | POA: Diagnosis not present

## 2023-11-15 DIAGNOSIS — R519 Headache, unspecified: Secondary | ICD-10-CM | POA: Diagnosis not present

## 2023-11-15 DIAGNOSIS — J811 Chronic pulmonary edema: Secondary | ICD-10-CM | POA: Diagnosis not present

## 2023-11-15 DIAGNOSIS — M158 Other polyosteoarthritis: Secondary | ICD-10-CM | POA: Diagnosis not present

## 2023-11-15 DIAGNOSIS — G894 Chronic pain syndrome: Secondary | ICD-10-CM | POA: Diagnosis not present

## 2023-11-15 DIAGNOSIS — S61551A Open bite of right wrist, initial encounter: Secondary | ICD-10-CM | POA: Diagnosis not present

## 2023-11-15 DIAGNOSIS — M47812 Spondylosis without myelopathy or radiculopathy, cervical region: Secondary | ICD-10-CM | POA: Diagnosis not present

## 2023-11-15 DIAGNOSIS — M47816 Spondylosis without myelopathy or radiculopathy, lumbar region: Secondary | ICD-10-CM | POA: Diagnosis not present

## 2023-11-15 DIAGNOSIS — M5136 Other intervertebral disc degeneration, lumbar region with discogenic back pain only: Secondary | ICD-10-CM | POA: Diagnosis not present

## 2023-11-15 DIAGNOSIS — K861 Other chronic pancreatitis: Secondary | ICD-10-CM | POA: Diagnosis not present

## 2023-11-15 DIAGNOSIS — Z79891 Long term (current) use of opiate analgesic: Secondary | ICD-10-CM | POA: Diagnosis not present

## 2023-11-15 DIAGNOSIS — Z8261 Family history of arthritis: Secondary | ICD-10-CM | POA: Diagnosis not present

## 2023-11-15 DIAGNOSIS — W540XXA Bitten by dog, initial encounter: Secondary | ICD-10-CM | POA: Diagnosis not present

## 2023-11-15 DIAGNOSIS — J90 Pleural effusion, not elsewhere classified: Secondary | ICD-10-CM | POA: Diagnosis not present

## 2023-11-15 DIAGNOSIS — M5030 Other cervical disc degeneration, unspecified cervical region: Secondary | ICD-10-CM | POA: Diagnosis not present

## 2023-11-27 ENCOUNTER — Encounter: Payer: Self-pay | Admitting: Internal Medicine

## 2023-11-27 ENCOUNTER — Ambulatory Visit: Admitting: Internal Medicine

## 2023-11-27 VITALS — BP 124/78 | HR 97 | Temp 97.7°F | Ht 73.0 in | Wt 215.0 lb

## 2023-11-27 DIAGNOSIS — Z125 Encounter for screening for malignant neoplasm of prostate: Secondary | ICD-10-CM

## 2023-11-27 DIAGNOSIS — I77819 Aortic ectasia, unspecified site: Secondary | ICD-10-CM | POA: Diagnosis not present

## 2023-11-27 DIAGNOSIS — R4189 Other symptoms and signs involving cognitive functions and awareness: Secondary | ICD-10-CM | POA: Diagnosis not present

## 2023-11-27 DIAGNOSIS — E041 Nontoxic single thyroid nodule: Secondary | ICD-10-CM | POA: Diagnosis not present

## 2023-11-27 DIAGNOSIS — M069 Rheumatoid arthritis, unspecified: Secondary | ICD-10-CM

## 2023-11-27 DIAGNOSIS — F322 Major depressive disorder, single episode, severe without psychotic features: Secondary | ICD-10-CM

## 2023-11-27 DIAGNOSIS — G479 Sleep disorder, unspecified: Secondary | ICD-10-CM | POA: Diagnosis not present

## 2023-11-27 DIAGNOSIS — E7800 Pure hypercholesterolemia, unspecified: Secondary | ICD-10-CM | POA: Diagnosis not present

## 2023-11-27 DIAGNOSIS — I10 Essential (primary) hypertension: Secondary | ICD-10-CM | POA: Diagnosis not present

## 2023-11-27 DIAGNOSIS — R413 Other amnesia: Secondary | ICD-10-CM

## 2023-11-27 DIAGNOSIS — Z8673 Personal history of transient ischemic attack (TIA), and cerebral infarction without residual deficits: Secondary | ICD-10-CM | POA: Diagnosis not present

## 2023-11-27 DIAGNOSIS — S6991XD Unspecified injury of right wrist, hand and finger(s), subsequent encounter: Secondary | ICD-10-CM | POA: Diagnosis not present

## 2023-11-27 LAB — HEPATIC FUNCTION PANEL
ALT: 94 U/L — ABNORMAL HIGH (ref 0–53)
AST: 65 U/L — ABNORMAL HIGH (ref 0–37)
Albumin: 4.5 g/dL (ref 3.5–5.2)
Alkaline Phosphatase: 153 U/L — ABNORMAL HIGH (ref 39–117)
Bilirubin, Direct: 0.1 mg/dL (ref 0.0–0.3)
Total Bilirubin: 0.4 mg/dL (ref 0.2–1.2)
Total Protein: 7.7 g/dL (ref 6.0–8.3)

## 2023-11-27 LAB — BASIC METABOLIC PANEL WITH GFR
BUN: 23 mg/dL (ref 6–23)
CO2: 23 meq/L (ref 19–32)
Calcium: 9.4 mg/dL (ref 8.4–10.5)
Chloride: 102 meq/L (ref 96–112)
Creatinine, Ser: 0.95 mg/dL (ref 0.40–1.50)
GFR: 86.54 mL/min (ref >60.00)
Glucose, Bld: 103 mg/dL — ABNORMAL HIGH (ref 70–99)
Potassium: 4.7 meq/L (ref 3.5–5.1)
Sodium: 135 meq/L (ref 135–145)

## 2023-11-27 LAB — LIPID PANEL
Cholesterol: 137 mg/dL (ref 0–200)
HDL: 51.6 mg/dL (ref >39.00)
LDL Cholesterol: 71 mg/dL (ref 0–99)
NonHDL: 85.35
Total CHOL/HDL Ratio: 3
Triglycerides: 71 mg/dL (ref 0.0–149.0)
VLDL: 14.2 mg/dL (ref 0.0–40.0)

## 2023-11-27 LAB — CBC WITH DIFFERENTIAL/PLATELET
Basophils Absolute: 0.1 K/uL (ref 0.0–0.1)
Basophils Relative: 1.1 % (ref 0.0–3.0)
Eosinophils Absolute: 0.7 K/uL (ref 0.0–0.7)
Eosinophils Relative: 9.8 % — ABNORMAL HIGH (ref 0.0–5.0)
HCT: 44.3 % (ref 39.0–52.0)
Hemoglobin: 14.8 g/dL (ref 13.0–17.0)
Lymphocytes Relative: 26.9 % (ref 12.0–46.0)
Lymphs Abs: 1.8 K/uL (ref 0.7–4.0)
MCHC: 33.5 g/dL (ref 30.0–36.0)
MCV: 91 fl (ref 78.0–100.0)
Monocytes Absolute: 0.6 K/uL (ref 0.1–1.0)
Monocytes Relative: 9.1 % (ref 3.0–12.0)
Neutro Abs: 3.6 K/uL (ref 1.4–7.7)
Neutrophils Relative %: 53.1 % (ref 43.0–77.0)
Platelets: 356 K/uL (ref 150.0–400.0)
RBC: 4.86 Mil/uL (ref 4.22–5.81)
RDW: 13.3 % (ref 11.5–15.5)
WBC: 6.8 K/uL (ref 4.0–10.5)

## 2023-11-27 LAB — FERRITIN: Ferritin: 23.3 ng/mL (ref 22.0–322.0)

## 2023-11-27 LAB — PSA: PSA: 0.68 ng/mL (ref 0.10–4.00)

## 2023-11-27 LAB — VITAMIN B12: Vitamin B-12: 950 pg/mL — ABNORMAL HIGH (ref 211–911)

## 2023-11-27 MED ORDER — AMLODIPINE BESYLATE 5 MG PO TABS: 5.0000 mg | ORAL_TABLET | Freq: Two times a day (BID) | ORAL | 1 refills | Status: AC

## 2023-11-27 MED ORDER — TRAZODONE HCL 100 MG PO TABS: 100.0000 mg | ORAL_TABLET | Freq: Every day | ORAL | 0 refills | Status: DC

## 2023-11-27 NOTE — Assessment & Plan Note (Signed)
 Continue amlodipine . Blood pressure as outlined. Blood pressure doing well. No changes in medication. Check metabolic panel today.

## 2023-11-27 NOTE — Assessment & Plan Note (Addendum)
 Is a persistent problems and feels is worsening.  Some decrease over the past several months. No acute change. Will check routine labs, including cbc, b12, metabolic panel. Recent tsh wnl. Discussed f/u with neurology. Discussed medications.

## 2023-11-27 NOTE — Progress Notes (Signed)
 Subjective:    Patient ID: [name], male    DOB: 06/06/1960, 63 y.o.   MRN: [id number]  Patient here for  Chief Complaint  Patient presents with   Medical Management of Chronic Issues    HPI Here for a scheduled follow up - follow up regarding hypertension, CAD and hypercholesterolemia. Is s/p right wrist surgery 10/10/23. Being followed by orthopedic surgery. Wrist injury - from dog bite. Received rabies vaccinations. Also still being followed by pain clinic. Saw Dr Lane 09/19/23 - evaluation for headaches, photophobia and brain fog. Recommended to increase nortriptyline to 10mg  in the am and continue 50mg  q hs for headaches and back pain. Also instructed to increase lyrica to 75mg  q hs x 1 week and then 100mg  q hs. He is going to confirm dose and confirm if taking when gets home. Reports noticing some decrease in cognition. Some fatigue. Able to drive without problems. States he forgets doctor's appts. Does keep reminders. No increased sob reported. No abdominal pain or bowel change reported. Due to f/u with psych today.  Had previously been placed on trazodone  by me - to help with sleep. Discussed given recent increase and addition of nortriptyline and lyrica, along with the trazodone , if this could be affecting his memory. He feels the trazodone  has significantly helped his sleep and desires not to change. Headaches are better. No chest pain reported. Breathing stable. No abdominal pain or bowel change currently.    Past Medical History:  Diagnosis Date   Allergy    Anoxic brain injury [facility])    Arthritis    Awareness under anesthesia    need a lot to put me under   Chronic back pain    s/p 5 back surgeries   Coronary artery disease    Cardiac catheterization in 2007 at [facility] showed 30% stenosis in proximal LAD. No other disease. Ejection fraction was 65%   Depression    H/O acute pancreatitis    Hypercholesterolemia    Hypertension    Mild cognitive impairment with memory  loss    Pancreatitis    Past Surgical History:  Procedure Laterality Date   abdomial surgery     BACK SURGERY     multiple back surgeries (5)   CARDIAC CATHETERIZATION     [facility]    CHOLECYSTECTOMY  2013   COLONOSCOPY N/A 03/05/2021   Procedure: COLONOSCOPY;  Surgeon: Onita Elspeth Sharper, DO;  Location: [facility] ENDOSCOPY;  Service: Gastroenterology;  Laterality: N/A;  IDDM   COLONOSCOPY WITH PROPOFOL  N/A 06/30/2015   Procedure: COLONOSCOPY WITH PROPOFOL ;  Surgeon: Gladis RAYMOND Mariner, MD;  Location: [facility] ENDOSCOPY;  Service: Endoscopy;  Laterality: N/A;   ESOPHAGOGASTRODUODENOSCOPY N/A 08/31/2023   Procedure: EGD (ESOPHAGOGASTRODUODENOSCOPY);  Surgeon: Onita Elspeth Sharper, DO;  Location: [facility] ENDOSCOPY;  Service: Gastroenterology;  Laterality: N/A;  Requesting AM appt   ESOPHAGOGASTRODUODENOSCOPY (EGD) WITH PROPOFOL  N/A 03/30/2016   Procedure: ESOPHAGOGASTRODUODENOSCOPY (EGD) WITH PROPOFOL ;  Surgeon: Lamar ONEIDA Holmes, MD;  Location: [facility] ENDOSCOPY;  Service: Endoscopy;  Laterality: N/A;   FUNCTIONAL ENDOSCOPIC SINUS SURGERY     JOINT REPLACEMENT     LAMINECTOMY     LUMBAR SPINE SURGERY     SHOULDER ARTHROSCOPY WITH ROTATOR CUFF REPAIR AND OPEN BICEPS TENODESIS Right    SHOULDER ARTHROSCOPY WITH ROTATOR CUFF REPAIR AND OPEN BICEPS TENODESIS     SPINE SURGERY     TRIGGER POINT INJECTION     VASECTOMY     With Reversal   VEIN SURGERY Right 2013  arm   Family History  Problem Relation Age of Onset   Heart disease Father    Heart attack Father    Heart disease Mother    Heart attack Mother    Heart attack Brother        MI's x 2    Hypertension Other    Colon cancer Other    Hypertension Sister    Heart attack Paternal Grandfather    Hypertension Sister    Social History   Socioeconomic History   Marital status: Divorced    Spouse name: Not on file   Number of children: 3   Years of education: Not on file   Highest education level: Not on file  Occupational History   Not on  file  Tobacco Use   Smoking status: Never   Smokeless tobacco: Never  Vaping Use   Vaping status: Never Used  Substance and Sexual Activity   Alcohol use: No    Alcohol/week: 0.0 standard drinks of alcohol   Drug use: No   Sexual activity: Yes  Other Topics Concern   Not on file  Social History Narrative   Not on file   Social Drivers of Health   Financial Resource Strain: High Risk (09/20/2023)   Received from [facility]   Overall Financial Resource Strain (CARDIA)    Difficulty of Paying Living Expenses: Hard  Food Insecurity: Patient Declined (09/20/2023)   Received from [facility]   Hunger Vital Sign    Within the past 12 months, you worried that your food would run out before you got the money to buy more.: Patient declined    Within the past 12 months, the food you bought just didn't last and you didn't have money to get more.: Patient declined  Transportation Needs: Patient Declined (09/20/2023)   Received from [facility] - Transportation    In the past 12 months, has lack of transportation kept you from medical appointments or from getting medications?: Patient declined    Lack of Transportation (Non-Medical): Patient declined  Physical Activity: Not on file  Stress: No Stress Concern Present (12/29/2020)   [employer] of Occupational [employer] - Occupational Stress Questionnaire    Feeling of Stress : Not at all  Social Connections: Unknown (12/29/2020)   Social Connection and Isolation Panel    Frequency of Communication with Friends and Family: More than three times a week    Frequency of Social Gatherings with Friends and Family: More than three times a week    Attends Religious Services: Not on file    Active Member of Clubs or Organizations: Not on file    Attends Banker Meetings: Not on file    Marital Status: Not on file     Review of Systems  Constitutional:  Positive for  fatigue. Negative for appetite change and unexpected weight change.  HENT:  Negative for congestion and sinus pressure.   Respiratory:  Negative for cough, chest tightness and shortness of breath.   Cardiovascular:  Negative for chest pain, palpitations and leg swelling.  Gastrointestinal:  Negative for abdominal pain, diarrhea, nausea and vomiting.  Genitourinary:  Negative for difficulty urinating and dysuria.  Musculoskeletal:  Positive for back pain.       Chronic back pain.   Skin:  Negative for color change and rash.  Neurological:  Negative for dizziness and headaches.  Psychiatric/Behavioral:  Negative for agitation and dysphoric mood.  Objective:     BP 124/78   Pulse 97   Temp 97.7 F (36.5 C) (Oral)   Ht 6' 1 (1.854 m)   Wt 215 lb (97.5 kg)   SpO2 97%   BMI 28.37 kg/m  Wt Readings from Last 3 Encounters:  11/28/23 213 lb 13.5 oz (97 kg)  11/27/23 215 lb (97.5 kg)  08/31/23 215 lb (97.5 kg)    Physical Exam Vitals reviewed.  Constitutional:      General: He is not in acute distress.    Appearance: Normal appearance. He is well-developed.  HENT:     Head: Normocephalic and atraumatic.     Right Ear: External ear normal.     Left Ear: External ear normal.     Mouth/Throat:     Pharynx: No oropharyngeal exudate or posterior oropharyngeal erythema.  Eyes:     General: No scleral icterus.       Right eye: No discharge.        Left eye: No discharge.     Conjunctiva/sclera: Conjunctivae normal.  Cardiovascular:     Rate and Rhythm: Normal rate and regular rhythm.  Pulmonary:     Effort: Pulmonary effort is normal. No respiratory distress.     Breath sounds: Normal breath sounds.  Abdominal:     General: Bowel sounds are normal.     Palpations: Abdomen is soft.     Tenderness: There is no abdominal tenderness.  Musculoskeletal:        General: No swelling or tenderness.     Cervical back: Neck supple. No tenderness.  Lymphadenopathy:      Cervical: No cervical adenopathy.  Skin:    Findings: No erythema or rash.  Neurological:     Mental Status: He is alert.  Psychiatric:        Mood and Affect: Mood normal.        Behavior: Behavior normal.         Outpatient Encounter Medications as of 11/27/2023  Medication Sig   nortriptyline (PAMELOR) 50 MG capsule Take 50 mg by mouth at bedtime.   adalimumab (HUMIRA) 40 MG/0.4ML pen Inject 40 mg into the skin every 14 (fourteen) days.   amLODipine  (NORVASC ) 5 MG tablet Take 1 tablet (5 mg total) by mouth 2 (two) times daily.   atomoxetine  (STRATTERA ) 100 MG capsule Take 100 mg by mouth daily.   Azelastine  HCl 137 MCG/SPRAY SOLN PLACE 1 SPRAY INTO BOTH NOSTRILS 2 TIMES DAILY. USE IN EACH NOSTRIL AS DIRECTED   desvenlafaxine (PRISTIQ) 100 MG 24 hr tablet Take 100 mg by mouth daily.   escitalopram (LEXAPRO) 10 MG tablet Take 10 mg by mouth daily.   gabapentin  (NEURONTIN ) 300 MG capsule Take 1 or 2 capsules by mouth TID (Patient not taking: Reported on 08/31/2023)   hydrOXYzine (ATARAX) 10 MG tablet TAKE ONE TAB AS NEEDED FOR ITCHING AT BEDTIME   ketoconazole (NIZORAL) 2 % shampoo Apply topically.   modafinil  (PROVIGIL ) 200 MG tablet Take 400 mg by mouth in the morning.   oxyCODONE  (OXY IR/ROXICODONE ) 5 MG immediate release tablet Take 5-10 mg by mouth at bedtime as needed for moderate pain.    pantoprazole  (PROTONIX ) 40 MG tablet Take 40 mg by mouth 2 (two) times daily.   rosuvastatin  (CRESTOR ) 5 MG tablet TAKE 1 TABLET (5 MG TOTAL) BY MOUTH DAILY.   sildenafil (REVATIO) 20 MG tablet TAKE 4 - 5 TABLETS BY MOUTH DAILY AS NEEDED   sucralfate  (CARAFATE ) 1 g tablet Take 1  g by mouth daily.    tamsulosin  (FLOMAX ) 0.4 MG CAPS capsule Take 0.4 mg by mouth daily. (Patient not taking: Reported on 08/31/2023)   tiZANidine  (ZANAFLEX ) 4 MG tablet TAKE 1 - 2 TABLETS BY MOUTH THREE TIMES DAILY AS NEEDED   traZODone  (DESYREL ) 100 MG tablet Take 1 tablet (100 mg total) by mouth at bedtime. Do not  take with oxycodone  or muscle relaxer.   [DISCONTINUED] amLODipine  (NORVASC ) 5 MG tablet TAKE 1 TABLET BY MOUTH TWICE A DAY   [DISCONTINUED] traZODone  (DESYREL ) 100 MG tablet TAKE 1 TABLET (100 MG TOTAL) BY MOUTH AT BEDTIME. DO NOT TAKE WITH OXYCODONE  OR MUSCLE RELAXER.   No facility-administered encounter medications on file as of 11/27/2023.     Lab Results  Component Value Date   WBC 8.2 11/28/2023   HGB 15.2 11/28/2023   HCT 44.3 11/28/2023   PLT 343 11/28/2023   GLUCOSE 144 (H) 11/28/2023   CHOL 137 11/27/2023   TRIG 71.0 11/27/2023   HDL 51.60 11/27/2023   LDLCALC 71 11/27/2023   ALT 74 (H) 11/28/2023   AST 42 (H) 11/28/2023   NA 133 (L) 11/28/2023   K 4.2 11/28/2023   CL 102 11/28/2023   CREATININE 1.02 11/28/2023   BUN 25 (H) 11/28/2023   CO2 21 (L) 11/28/2023   TSH 1.69 07/27/2023   PSA 0.68 11/27/2023   INR 1.0 06/23/2023   HGBA1C 5.6 09/23/2019       Assessment & Plan:  Prostate cancer screening -     PSA  Hypercholesteremia Assessment & Plan: Low cholesterol diet and exercise.  Continue crestor .  Follow lipid panel.   Orders: -     Lipid panel -     Hepatic function panel -     Basic metabolic panel with GFR  Rheumatoid arthritis, involving unspecified site, unspecified whether rheumatoid factor present [facility]) Assessment & Plan: Being followed by rheumatology.  On humira.  Stable. Check cbc and metabolic panel.   Orders: -     Basic metabolic panel with GFR -     CBC with Differential/Platelet -     Ferritin  Moderately severe major depression (HCC)  Cognitive change Assessment & Plan: Concern regarding recent change as outlined. Discussed the need to follow up with neurology. Discussed medications - to discuss with neurology. No focal deficit noted on exam today. Check labs as outlined including B12.   Orders: -     Vitamin B12  Thyroid  nodule Assessment & Plan: Found on incidentally on CTA.  Discussed.  Had f/u thyroid  ultrasound 11/2022  - normal. TSH checked - 07/2023 wnl.    Sleep difficulties Assessment & Plan: Doing well on trazodone . Has f/u with psychiatry today. Discussed earlier f/u with neurology as outlined    Short-term memory loss Assessment & Plan: Is a persistent problems and feels is worsening.  Some decrease over the past several months. No acute change. Will check routine labs, including cbc, b12, metabolic panel. Recent tsh wnl. Discussed f/u with neurology. Discussed medications.    Primary hypertension Assessment & Plan: Continue amlodipine . Blood pressure as outlined. Blood pressure doing well. No changes in medication. Check metabolic panel today.    Aortic dilatation Assessment & Plan: Aortic root ectasia - 4cm (CT chest - [facility] 03/31/2022).  Followed by cardiology. Plans to discussed with cardiology regarding f/u.    History of cerebral infarction Assessment & Plan: Remote history of right basal ganglia infarct. Seeing neurology. Continue risk factor modifcation. Blood pressure doing well. Continue  crestor . Continue daily aspirin .    Injury of right wrist, subsequent encounter Assessment & Plan: Is s/p right wrist surgery 10/10/23. Being followed by orthopedic surgery.    Other orders -     amLODIPine  Besylate; Take 1 tablet (5 mg total) by mouth 2 (two) times daily.  Dispense: 180 tablet; Refill: 1 -     traZODone  HCl; Take 1 tablet (100 mg total) by mouth at bedtime. Do not take with oxycodone  or muscle relaxer.  Dispense: 90 tablet; Refill: 0   I spent 45 minutes with the patient.  Time spent discussing his current concerns and symptoms. Specifically time spent discussing his recent injury, medications and further evaluation.  Time also spent discussing further w/up, evaluation and treatment.    Allena Hamilton, MD

## 2023-11-27 NOTE — Assessment & Plan Note (Signed)
 Being followed by rheumatology.  On humira.  Stable. Check cbc and metabolic panel.

## 2023-11-27 NOTE — Assessment & Plan Note (Signed)
 Doing well on trazodone . Has f/u with psychiatry today. Discussed earlier f/u with neurology as outlined

## 2023-11-27 NOTE — Assessment & Plan Note (Signed)
 Found on incidentally on CTA.  Discussed.  Had f/u thyroid  ultrasound 11/2022 - normal. TSH checked - 07/2023 wnl.

## 2023-11-27 NOTE — Assessment & Plan Note (Addendum)
 Aortic root ectasia - 4cm (CT chest - [facility] 03/31/2022).  Followed by cardiology. Plans to discussed with cardiology regarding f/u.

## 2023-11-28 ENCOUNTER — Emergency Department

## 2023-11-28 ENCOUNTER — Emergency Department
Admission: EM | Admit: 2023-11-28 | Discharge: 2023-11-28 | Disposition: A | Discharge status: Home / Self Care | Attending: Emergency Medicine | Admitting: Emergency Medicine

## 2023-11-28 ENCOUNTER — Other Ambulatory Visit: Payer: Self-pay | Admitting: Medical Genetics

## 2023-11-28 DIAGNOSIS — J9811 Atelectasis: Secondary | ICD-10-CM | POA: Diagnosis not present

## 2023-11-28 DIAGNOSIS — I2510 Atherosclerotic heart disease of native coronary artery without angina pectoris: Secondary | ICD-10-CM | POA: Diagnosis not present

## 2023-11-28 DIAGNOSIS — W19XXXA Unspecified fall, initial encounter: Secondary | ICD-10-CM

## 2023-11-28 DIAGNOSIS — W01198A Fall on same level from slipping, tripping and stumbling with subsequent striking against other object, initial encounter: Secondary | ICD-10-CM | POA: Diagnosis not present

## 2023-11-28 DIAGNOSIS — I7121 Aneurysm of the ascending aorta, without rupture: Secondary | ICD-10-CM | POA: Insufficient documentation

## 2023-11-28 DIAGNOSIS — R42 Dizziness and giddiness: Secondary | ICD-10-CM | POA: Diagnosis not present

## 2023-11-28 DIAGNOSIS — R7989 Other specified abnormal findings of blood chemistry: Secondary | ICD-10-CM

## 2023-11-28 DIAGNOSIS — R55 Syncope and collapse: Secondary | ICD-10-CM | POA: Insufficient documentation

## 2023-11-28 DIAGNOSIS — I10 Essential (primary) hypertension: Secondary | ICD-10-CM | POA: Diagnosis not present

## 2023-11-28 DIAGNOSIS — R945 Abnormal results of liver function studies: Secondary | ICD-10-CM | POA: Diagnosis not present

## 2023-11-28 DIAGNOSIS — M25571 Pain in right ankle and joints of right foot: Secondary | ICD-10-CM | POA: Insufficient documentation

## 2023-11-28 DIAGNOSIS — Z9049 Acquired absence of other specified parts of digestive tract: Secondary | ICD-10-CM | POA: Diagnosis not present

## 2023-11-28 LAB — TROPONIN I (HIGH SENSITIVITY)
Troponin I (High Sensitivity): 4 ng/L (ref <18)
Troponin I (High Sensitivity): 4 ng/L (ref <18)

## 2023-11-28 LAB — URINALYSIS, ROUTINE W REFLEX MICROSCOPIC
Bilirubin Urine: NEGATIVE
Glucose, UA: NEGATIVE mg/dL
Hgb urine dipstick: NEGATIVE
Ketones, ur: NEGATIVE mg/dL
Leukocytes,Ua: NEGATIVE
Nitrite: NEGATIVE
Protein, ur: NEGATIVE mg/dL
Specific Gravity, Urine: 1.02 (ref 1.005–1.030)
pH: 6 (ref 5.0–8.0)

## 2023-11-28 LAB — COMPREHENSIVE METABOLIC PANEL WITH GFR
ALT: 74 U/L — ABNORMAL HIGH (ref 0–44)
AST: 42 U/L — ABNORMAL HIGH (ref 15–41)
Albumin: 4.2 g/dL (ref 3.5–5.0)
Alkaline Phosphatase: 146 U/L — ABNORMAL HIGH (ref 38–126)
Anion gap: 10 (ref 5–15)
BUN: 25 mg/dL — ABNORMAL HIGH (ref 8–23)
CO2: 21 mmol/L — ABNORMAL LOW (ref 22–32)
Calcium: 8.9 mg/dL (ref 8.9–10.3)
Chloride: 102 mmol/L (ref 98–111)
Creatinine, Ser: 1.02 mg/dL (ref 0.61–1.24)
GFR, Estimated: >60 mL/min (ref >60)
Glucose, Bld: 144 mg/dL — ABNORMAL HIGH (ref 70–99)
Potassium: 4.2 mmol/L (ref 3.5–5.1)
Sodium: 133 mmol/L — ABNORMAL LOW (ref 135–145)
Total Bilirubin: 0.5 mg/dL (ref 0.0–1.2)
Total Protein: 8.6 g/dL — ABNORMAL HIGH (ref 6.5–8.1)

## 2023-11-28 LAB — HEPATITIS PANEL, ACUTE
HCV Ab: NONREACTIVE
Hep A IgM: NONREACTIVE
Hep B C IgM: NONREACTIVE
Hepatitis B Surface Ag: NONREACTIVE

## 2023-11-28 LAB — CBC
HCT: 44.3 % (ref 39.0–52.0)
Hemoglobin: 15.2 g/dL (ref 13.0–17.0)
MCH: 31 pg (ref 26.0–34.0)
MCHC: 34.3 g/dL (ref 30.0–36.0)
MCV: 90.4 fL (ref 80.0–100.0)
Platelets: 343 K/uL (ref 150–400)
RBC: 4.9 MIL/uL (ref 4.22–5.81)
RDW: 12.8 % (ref 11.5–15.5)
WBC: 8.2 K/uL (ref 4.0–10.5)
nRBC: 0 % (ref 0.0–0.2)

## 2023-11-28 LAB — D-DIMER, QUANTITATIVE: D-Dimer, Quant: 1.01 ug{FEU}/mL — ABNORMAL HIGH (ref 0.00–0.50)

## 2023-11-28 MED ORDER — ACETAMINOPHEN 500 MG PO TABS
1000.0000 mg | ORAL_TABLET | Freq: Once | ORAL | Status: AC
  Administered 2023-11-28: 1000 mg via ORAL
  Filled 2023-11-28: qty 2

## 2023-11-28 MED ORDER — SODIUM CHLORIDE 0.9 % IV BOLUS
1000.0000 mL | Freq: Once | INTRAVENOUS | Status: AC
  Administered 2023-11-28: 1000 mL via INTRAVENOUS

## 2023-11-28 MED ORDER — IOHEXOL 350 MG/ML SOLN
100.0000 mL | Freq: Once | INTRAVENOUS | Status: AC | PRN
  Administered 2023-11-28: 100 mL via INTRAVENOUS

## 2023-11-28 MED ORDER — ONDANSETRON HCL 4 MG/2ML IJ SOLN
4.0000 mg | Freq: Once | INTRAMUSCULAR | Status: AC
  Administered 2023-11-28: 4 mg via INTRAVENOUS
  Filled 2023-11-28: qty 2

## 2023-11-28 NOTE — Discharge Instructions (Addendum)
 Please make sure to keep yourself hydrated.  I put in a referral for you for cardiology to follow-up outpatient.  Please make sure to also see your primary care doctor this week to get reassessed and to get repeat liver function labs.  Your CT imaging today did show dilation of your aorta, which is the large blood vessel in your chest.  You will need to follow-up for repeat imaging of this in about 1 year and also schedule follow-up with a cardiothoracic surgeon.  Referral was provided and you may call their phone number to schedule this.

## 2023-11-28 NOTE — ED Provider Notes (Signed)
 [facility] Provider Note    Event Date/Time   First MD Initiated Contact with Patient 11/28/23 1131     (approximate)   History   Loss of Consciousness   HPI  [name] is a 63 y.o. male with history of CAD, hypertension, hyperlipidemia, mild cognitive impairment, RA, chronic back pain, presenting with syncopal episode.  Patient reported multiple syncopal episodes today.  States he fell and hit his head on the nightstand.  He passed out 2 more times.  Has been lightheaded today.  Does not clearly remember what happened preceding passing out.  Does take baby aspirin .  States that he has prior history of syncope but has not happened in several years.  He denies any shortness of breath or chest pain, no urinary symptoms, no nausea vomiting or diarrhea.  States that he did have a surgery to his right wrist last month.  No issues postsurgery.  States that he saw his primary care doctor yesterday and felt fine.  States that he has passed out 2 years ago, had an extensive workup including CT and cardiac workup that did not find any cause.  On independent chart review, he had an echo done in 2021 that showed normal EF.  He was also seen by primary care yesterday, had his medications reordered.     Physical Exam   Triage Vital Signs: ED Triage Vitals  Encounter Vitals Group     BP 11/28/23 1120 (!) 138/92     Girls Systolic BP Percentile --      Girls Diastolic BP Percentile --      Boys Systolic BP Percentile --      Boys Diastolic BP Percentile --      Pulse Rate 11/28/23 1120 (!) 113     Resp 11/28/23 1120 18     Temp 11/28/23 1120 98 F (36.7 C)     Temp Source 11/28/23 1120 Oral     SpO2 11/28/23 1120 96 %     Weight 11/28/23 1121 213 lb 13.5 oz (97 kg)     Height 11/28/23 1121 6' 1 (1.854 m)     Head Circumference --      Peak Flow --      Pain Score 11/28/23 1121 7     Pain Loc --      Pain Education --      Exclude from Growth Chart --      Most recent vital signs: Vitals:   11/28/23 1430 11/28/23 1500  BP: 125/87 124/87  Pulse: 84 82  Resp: (!) 23 (!) 22  Temp:    SpO2: 99% 99%     General: Awake, no distress.  CV:  Good peripheral perfusion.  Resp:  Normal effort.  No tachypnea or respiratory distress Abd:  No distention.  Soft nontender Other:  No palpable skull deformities or tenderness, no midline spinal tenderness, no tenderness to his left upper and lower extremity, no tenderness to his right upper extremity, he does have removable splint, his surgical site is clean dry and intact without overlying erythema, or swelling.  It is covered with Steri-Strips.  He has a removable brace on.  He does have mild tenderness to his right ankle superior lateral malleoli and anterior ankle, no tenderness to his foot, full range of motion of his bilateral lower extremities are intact without focal weakness or numbness, no facial droop or slurred speech.  Equal radial pulses and DP pulses bilaterally.  ED Results /  Procedures / Treatments   Labs (all labs ordered are listed, but only abnormal results are displayed) Labs Reviewed  COMPREHENSIVE METABOLIC PANEL WITH GFR - Abnormal; Notable for the following components:      Result Value   Sodium 133 (*)    CO2 21 (*)    Glucose, Bld 144 (*)    BUN 25 (*)    Total Protein 8.6 (*)    AST 42 (*)    ALT 74 (*)    Alkaline Phosphatase 146 (*)    All other components within normal limits  D-DIMER, QUANTITATIVE - Abnormal; Notable for the following components:   D-Dimer, Quant 1.01 (*)    All other components within normal limits  CBC  URINALYSIS, ROUTINE W REFLEX MICROSCOPIC  HEPATITIS PANEL, ACUTE  TROPONIN I (HIGH SENSITIVITY)  TROPONIN I (HIGH SENSITIVITY)     EKG  EKG shows, sinus tachycardia, rate 111, normal QRS, normal QTc, no obvious ischemic ST elevation, not significantly change compared to prior   RADIOLOGY On my independent interpretation, CT head  without obvious intracranial hemorrhage   PROCEDURES:  Critical Care performed: No  Procedures   MEDICATIONS ORDERED IN ED: Medications  sodium chloride  0.9 % bolus 1,000 mL (1,000 mLs Intravenous New Bag/Given 11/28/23 1243)  ondansetron  (ZOFRAN ) injection 4 mg (4 mg Intravenous Given 11/28/23 1339)  acetaminophen  (TYLENOL ) tablet 1,000 mg (1,000 mg Oral Given 11/28/23 1419)     IMPRESSION / MDM / ASSESSMENT AND PLAN / ED COURSE  I reviewed the triage vital signs and the nursing notes.                              Differential diagnosis includes, but is not limited to, arrhythmia, vasovagal, UTI, dehydration, electrolyte derangements, atypical ACS.  Did consider PE but patient has no other symptoms other than the syncopal episodes.  Given that he had recent surgery, will get a D-dimer. for the fall consider intracranial hemorrhage, fracture.  Labs, EKG, troponin, UA, CT head.  Fluids.  Patient's presentation is most consistent with acute presentation with potential threat to life or bodily function.  Independent interpretation of labs and imaging below.  On reassessment patient is feeling better, no lightheadedness.  Discussed with him about workup so far, he is pending a CT PE study.  Will give him a removable ankle brace for his right ankle.  Patient signed out pending CT PE study and right upper quadrant ultrasound, if continues to be asymptomatic and imaging is negative for acute findings, likely will be to be discharged.  Put in a referral for cardiology for him to follow-up, also instructed him to follow-up with primary care this week to get reassessed and to get repeat LFTs.  The patient is on the cardiac monitor to evaluate for evidence of arrhythmia and/or significant heart rate changes.   Clinical Course as of 11/28/23 1509  Tue Nov 28, 2023  1213 CT Head Wo Contrast 1. No acute intracranial abnormality.  [TT]  1305 Independent review of labs, no leukocytosis, troponin  is not elevated, mild hyponatremia, creatinine is normal, his LFTs are mildly elevated, T. bili is normal.  He has no abdominal pain or jaundice on exam, will add a hepatitis panel as well as get an right upper quadrant ultrasound. [TT]  1308 DG Ankle Complete Right Negative.  [TT]  1436 Troponin I (High Sensitivity) Troponin x 2 is negative. [TT]  1445 D-Dimer, Quant(!): 1.01 Elevated, will  order CT PE study. [TT]    Clinical Course User Index [TT] Waymond Lorelle Cummins, MD     FINAL CLINICAL IMPRESSION(S) / ED DIAGNOSES   Final diagnoses:  Syncope, unspecified syncope type  Fall, initial encounter  Acute right ankle pain  Elevated LFTs     Rx / DC Orders   ED Discharge Orders          Ordered    Ambulatory referral to Cardiology       Comments: If you have not heard from the Cardiology office within the next 72 hours please call [phone].   11/28/23 1456             Note:  This document was prepared using Dragon voice recognition software and may include unintentional dictation errors.    Waymond Lorelle Cummins, MD 11/28/23 [id number]

## 2023-11-28 NOTE — ED Notes (Signed)
 Called CCMD to add pt to monitoring.

## 2023-11-28 NOTE — ED Triage Notes (Signed)
 Pt presents to the ED via POV from home. Pt reports multiple syncopal episodes this morning pta. Pt states that he fell and hit his head on the nightstand. Denies use of blood thinner. States that he takes a baby aspirin . Pt A&Ox4. Has a hx of syncope, but states that it hasn't happened in a couple of years.

## 2023-11-28 NOTE — ED Provider Notes (Signed)
-----------------------------------------   3:03 PM on 11/28/2023 -----------------------------------------  Blood pressure 124/86, pulse 91, temperature 98 F (36.7 C), temperature source Oral, resp. rate 17, height 6' 1 (1.854 m), weight 97 kg, SpO2 99%.  Assuming care from Dr. Waymond.  In short, [name] is a 63 y.o. male with a chief complaint of Loss of Consciousness .  Refer to the original H&P for additional details.  The current plan of care is to follow-up CTA chest and RUQ US .  ----------------------------------------- 6:03 PM on 11/28/2023 ----------------------------------------- Right upper quadrant ultrasound is unremarkable, patient is status postcholecystectomy.  CTA chest is negative for PE but does note 4.1 cm thoracic aortic aneurysm.  Patient was advised of this finding and need to follow-up in 1 year for repeat imaging, also provided with referral to cardiothoracic surgery.  Given otherwise reassuring workup, he is appropriate for discharge home with outpatient cardiology follow-up.  He was counseled to return to the ED for new or worsening symptoms, patient agrees with plan.    Willo Dunnings, MD 11/28/23 [phone]

## 2023-11-29 ENCOUNTER — Encounter: Payer: Self-pay | Admitting: Internal Medicine

## 2023-11-29 ENCOUNTER — Ambulatory Visit: Payer: Self-pay | Admitting: Internal Medicine

## 2023-11-29 DIAGNOSIS — M25331 Other instability, right wrist: Secondary | ICD-10-CM | POA: Diagnosis not present

## 2023-11-29 DIAGNOSIS — E7800 Pure hypercholesterolemia, unspecified: Secondary | ICD-10-CM

## 2023-11-29 DIAGNOSIS — S99811A Other specified injuries of right ankle, initial encounter: Secondary | ICD-10-CM | POA: Diagnosis not present

## 2023-11-29 NOTE — Telephone Encounter (Signed)
 Please see result note on pt regarding his labs. Also, please call and confirm he has f/u scheduled. Let me know if I need to place order for referral.

## 2023-11-30 DIAGNOSIS — E785 Hyperlipidemia, unspecified: Secondary | ICD-10-CM | POA: Diagnosis not present

## 2023-11-30 DIAGNOSIS — Z8249 Family history of ischemic heart disease and other diseases of the circulatory system: Secondary | ICD-10-CM | POA: Diagnosis not present

## 2023-11-30 DIAGNOSIS — I77819 Aortic ectasia, unspecified site: Secondary | ICD-10-CM | POA: Diagnosis not present

## 2023-11-30 DIAGNOSIS — R0602 Shortness of breath: Secondary | ICD-10-CM | POA: Diagnosis not present

## 2023-11-30 DIAGNOSIS — R55 Syncope and collapse: Secondary | ICD-10-CM | POA: Diagnosis not present

## 2023-11-30 DIAGNOSIS — E7800 Pure hypercholesterolemia, unspecified: Secondary | ICD-10-CM | POA: Diagnosis not present

## 2023-11-30 DIAGNOSIS — I10 Essential (primary) hypertension: Secondary | ICD-10-CM | POA: Diagnosis not present

## 2023-11-30 DIAGNOSIS — I25119 Atherosclerotic heart disease of native coronary artery with unspecified angina pectoris: Secondary | ICD-10-CM | POA: Diagnosis not present

## 2023-11-30 NOTE — Telephone Encounter (Signed)
 See results

## 2023-11-30 NOTE — Progress Notes (Signed)
 Telemedicine video encounter documentation  This video encounter was conducted with the patient's (or proxy's) verbal consent via secure, interactive audio and video telecommunications while in clinic/office/hospital.  The patient (or proxy) was instructed to have this encounter in a suitably private space and to only have persons present to whom they give permission to participate. In addition, patient identity was confirmed by use of name plus two identifiers.   Chief Complaint:   No chief complaint on file.   Subjective:   [name] is a 63 y.o. male established patient. Video visit today for:    1.  30% stenosis proximal LAD by cardiac catheterization 2007             2.  Essential hypertension             3.  Near syncope secondary to amitriptyline             4.  Vasovagal syncope 09/23/2019, 12/27/2021, 11/28/2023             5.  Chronic low back pain             6.  Hyperlipidemia 7.  Rheumatoid arthritis  While visiting family in [place] , the patient experienced a near syncopal episode while standing in the kitchen, was caught before falling onto the floor. The patient feels this was related to amitriptyline, which caused him to feel lightheaded. Patient stopped taking amitriptyline. He has had no recurrent near syncopal episodes until 09/23/2019 at which time he presented to care noted clinic urgent care, for productive cough, had syncopal episode, and was sent to [facility] ED. CT was negative, chest CT was negative for pulmonary emboli, chest x-ray was negative, carotid ultrasound was unremarkable. 2D echocardiogram revealed normal left ventricular function, with LVEF of 60 to 65%. He had another syncopal episode in 12/2021.   72-hour Holter monitor 02/15/2023 - 02/18/2023 revealed predominant sinus rhythm, mean heart rate 83 bpm, heart rate range 55 to 150 bpm, 4 runs of SVT longest lasting 4 beats. 2D echocardiogram 2//2025 revealed normal left ventricular function, with LVEF  greater than 55%, with trivial mitral and tricuspid regurgitation. Lexiscan  Myoview 03/07/2023 revealed LVEF 64%, without evidence for scar or ischemia.   History of Present Illness [name] presents today via video telehealth for ER follow-up. He is accompanied by his partner, who is also his caregiver.   He was recently evaluated at [facility] on 11/28/2023 for 2 syncopal episodes that happened earlier that day. He states he got out of bed to go to the bathroom, sat on the side of the bed for a few seconds as he usually does, and then stood up and starting walking around the bed and collapsed to the floor. He states that he did not have preceding symptoms. He had a second syncopal episode when he got to the bathroom. He had no chest pain at the time, but states that the day before, he felt a sensation in his chest like something was stuck in his esophagus and it lingered much of the day. He states that prior to this episode, the last episode of syncope occurred in 2023. He states that they typically occur when he is changing positions, like getting up to the go to the bathroom early in the morning. In the ED, labs showed mildly elevated liver enzymes, negative troponin x 2, and mild hyponatremia. EKG showed sinus tachycardia at a rate of 111 bpm. D-dimer elevated; chest CTA negative for PE, but did  show ascending thoracic aorta measuring 4.1 cm. Head CT was negative for acute abnormality. Previous chest CTA in 2021 showed mild dilatation of ascending aorta measuring 3.9 cm.   He also reports a several year history of fatigue and exertional dyspnea. This has been a long-standing issue but has progressively worsened. He states that he has had health issues, such as wrist surgery, falls, memory changes, and rheumatoid arthritis, and voices frustration that he has poor quality of life due to his limitations.  He has a history of rheumatoid arthritis and has been on Humira for two to three years. Despite this  treatment, his fatigue has persisted and worsened. He underwent wrist surgery about a month ago, impacting his ability to exercise and maintain his previous lifestyle. Pain medication for his wrist limits his ability to drive and perform daily activities.  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 He experiences jerking movements during sleep, disturbing his partner. He previously took gabapentin  for restless leg syndrome but has since switched to another medication, which he cannot recall.  He is currently taking nortriptyline 10 mg during the day and 50 mg at night, primarily for headaches. This medication has effectively stopped his headaches. He also takes amlodipine  5 mg for blood pressure management.  His family history is significant for cardiovascular issues, including multiple heart attacks and surgeries among close relatives, which he refers to as the 'Stamas curse.' He expresses concern about his own risk of heart-related issues given this family history.  No alcohol consumption. Reports excessive water intake and soda consumption for caffeine due to fatigue. No sleep apnea.    Patient Active Problem List  Diagnosis  . Sacroiliitis, not elsewhere classified ()  . Postlaminectomy syndrome, lumbar region  . Brachial neuritis or radiculitis NOS  . Cervical radiculopathy  . Abdominal wall pain in periumbilical region  . Diarrhea  . Abdominal pain, generalized  . Pancreatitis (HHS-HCC)  . Cervical spondylosis without myelopathy  . Chest pain with high risk for cardiac etiology  . Heart palpitations  . SOB (shortness of breath) on exertion  . Hypo-osmolality and hyponatremia  . Tachycardia  . Biceps tendinitis, right  . Complete tear of right rotator cuff  . Hypertension  . Vasovagal syncope  . Abnormal liver function tests  . Anemia  . Chronic back pain  .  Diverticulosis of colon without hemorrhage  . Fatigue  . Fungal infection of foot  . Immunization counseling  . History of pancreatitis  . Hypercholesteremia  . Left knee pain  . Neck fullness  . Rash  . Short-term memory loss  . Bursitis of right shoulder  . Complete tear of left rotator cuff  . Biceps tendinitis, left  . Chondral defect of condyle of left femur  . Bicipital tendinitis of left shoulder  . Lower urinary tract symptoms (LUTS)  . Peyronie disease  . Primary osteoarthritis of left knee  . Aortic dilatation ()  . Elbow pain  . Hyponatremia  . Right leg swelling  . Sinusitis, unspecified  . Urinary hesitancy  . Bilateral knee effusions  . S/P cardiac catheterization  . Seropositive rheumatoid arthritis (CMS/HHS-HCC)  . Sebaceous cyst  . Mild depression  . Exposure to the flu  . Flu-like symptoms  . Depression, major, single episode, mild ()  . Sleep difficulties  . Elevated alkaline phosphatase level  . Syncope and collapse  . Rectal  bleeding  . Pain syndrome, chronic  . GERD without esophagitis  . Mucoid cyst of joint  . Synovitis of toe  . Hammer toes of both feet  . Wrist pain  . Chronic pain  . Preoperative evaluation of a medical condition to rule out surgical contraindications (TAR required)    Outpatient Medications Prior to Visit  Medication Sig Dispense Refill  . adalimumab (HUMIRA) 40 mg/0.4 mL pen injector kit Inject 40 mg subcutaneously once a week Last dosage 09/25/23. 2 kit 11  . amLODIPine  (NORVASC ) 5 MG tablet Take 5 mg by mouth 2 (two) times daily    . aspirin  81 MG chewable tablet Take 1 tablet (81 mg total) by mouth once daily No cardiac stent    . azelastine  (ASTELIN ) 137 mcg nasal spray PLACE 1 SPRAY INTO BOTH NOSTRILS 2 TIMES DAILY. USE IN EACH NOSTRIL AS DIRECTED    . CREATINE MONOHYDRATE ORAL Take by mouth once daily    . cyanocobalamin, vitamin B-12, 3,000 mcg Subl Place under the tongue once daily    . ketoconazole (NIZORAL) 2  % shampoo Wash scalp once weekly 120 mL 11  . modafinil  (PROVIGIL ) 200 MG tablet Take 200 mg by mouth once daily    . nortriptyline (PAMELOR) 10 MG capsule Take 1 capsule (10 mg total) by mouth every morning 30 capsule 3  . nortriptyline (PAMELOR) 50 MG capsule Take 1 capsule (50 mg total) by mouth at bedtime 30 capsule 2  . ondansetron  (ZOFRAN -ODT) 4 MG disintegrating tablet Take 1 tablet (4 mg total) by mouth every 6 (six) hours as needed 30 tablet 1  . oxyCODONE  (ROXICODONE ) 5 MG immediate release tablet Take 1 or 2 tabs by mouth qhs prn 10 tablet 0  . pantoprazole  (PROTONIX ) 40 MG DR tablet Take 1 tablet (40 mg total) by mouth 2 (two) times daily before meals 180 tablet 3  . pregabalin (LYRICA) 25 MG capsule Take 75 mg (three tablets) at night for one week and then increase to 100 mg (four tablets) at night for chronic back pain, sleep, numbness and tingling. 120 capsule 2  . promethazine -dextromethorphan (PROMETHAZINE -DM) 6.25-15 mg/5 mL syrup Take 5 mLs by mouth every 8 (eight) hours as needed for Cough 120 mL 0  . rosuvastatin  (CRESTOR ) 5 MG tablet Take 5 mg by mouth at bedtime    . sildenafil, antihypertensive, (REVATIO) 20 mg tablet   3  . tiZANidine  (ZANAFLEX ) 4 MG tablet Take 1 or 2 tabs by mouth TID prn 120 tablet 11  . benzonatate  (TESSALON ) 200 MG capsule Take 1 capsule (200 mg total) by mouth 3 (three) times daily as needed for Cough (Patient not taking: Reported on 09/19/2023) 30 capsule 1  . betamethasone dipropionate (DIPROSONE) 0.05 % ointment Apply 2x daily x 4 weeks then stop (Patient not taking: Reported on 06/23/2023) 45 g 1  . finasteride (PROPECIA) 1 mg tablet Take 1 tablet by mouth daily. These tablets should not be handled by male of child bearing potential. 30 tablet 7  . fluticasone  (FLONASE ) 50 mcg/actuation nasal spray  (Patient not taking: Reported on 11/29/2023)    . gabapentin  (NEURONTIN ) 300 MG capsule Take 2 capsules by mouth QHS (Patient not taking: Reported on  06/23/2023) 180 capsule 3  . hydrocortisone 2.5 % ointment Apply to affected area twice daily x 7 days then stop (Patient not taking: Reported on 06/23/2023) 30 g 2  . mupirocin (BACTROBAN) 2 % ointment Apply topically 3 (three) times daily (Patient not taking: Reported on  07/19/2023)    . nortriptyline (PAMELOR) 10 MG capsule Take 10 mg by mouth every morning (Patient not taking: Reported on 11/29/2023)    . saw palmetto 500 MG capsule Take 500 mg by mouth once daily (Patient not taking: Reported on 06/23/2023)    . sennosides-docusate (SENOKOT-S) 8.6-50 mg tablet Take 1 tablet by mouth once daily (Patient not taking: Reported on 11/29/2023) 30 tablet 0  . sucralfate  (CARAFATE ) 1 gram tablet Take 1 tablet (1 g total) by mouth 4 (four) times daily before meals and nightly (Patient not taking: Reported on 11/29/2023) 360 tablet 0   No facility-administered medications prior to visit.     ROS   Objective:   Vitals as reported by patient: There were no vitals filed for this visit. There is no height or weight on file to calculate BMI.  Constitutional: alert, interactive with provider, cooperative, in no distress, no apparent hearing difficulties Mental status: oriented x 3, good historian, appropriate mood and behavior, thought content appears normal Respiratory: no respiratory distress, no audible wheezing, speaking in full sentences Well-appearing  Assessment/Plan:   Assessment & Plan Syncope, orthostatic hypotension possible cause given positional componenet - Discuss nortriptyline with psychiatrist for risk-benefit analysis or alternatives for headache management as can cause orthostatic hypotension. - Monitor blood pressure at home, especially upon standing. - Consider cane for stability when standing. - Educated on sitting until dizziness resolves before standing.  Atherosclerotic heart disease with angina Intermittent chest pain possibly related to atherosclerotic heart disease.  Previous catheterization showed 30% LAD blockage. Normal troponin levels. Patient has risk factors including hypertension, hyperlipidemia, and family history of early CAD - Order cardiac CTA  Essential hypertension Blood pressure management ongoing with amlodipine  - Continue amlodipine  5 mg. - Monitor blood pressure at home, especially upon standing.  Ascending thoracic aortic dilatation 4.1 cm, compared to 3.9 cm in 2021. No immediate CT surgery referral needed. - Order repeat chest CT in 1 year. - Discussed with Dr. Ammon who is in agreement  Hyperlipidemia Managed with rosuvastatin    Rheumatoid arthritis Managed with Humira.   Chronic fatigue and exertional dyspnea Ongoing for years, possibly related to rheumatoid arthritis and depression. No sleep apnea detected on previous study, per patient.  Depression Depression may contribute to fatigue and poor quality of life. Current medications include nortriptyline, and others not on medication list - Discuss with psychiatrist about current depression medications and potential adjustments. - Patient will send message with list of medications prescribed by his psychiatrist   Cognitive impairment (memory loss) Recent memory loss episodes - Follow up with neurologist, Dr. Lane, for memory issues.  Restless legs syndrome / sleep-related movement disorder Reports of jerking movements during sleep, possibly related to restless legs syndrome. Previous treatment with gabapentin  and Lyrica. - Consider increasing dose of current medication for restless legs syndrome. Diagnoses and all orders for this visit:  Hypertension, essential -     CT heart angiogram; Future  Hyperlipidemia, unspecified hyperlipidemia type -     CT heart angiogram; Future  Family history of early CAD -     CT heart angiogram; Future  Syncope, unspecified syncope type -     CT heart angiogram; Future  Coronary artery disease involving native heart with  angina pectoris, unspecified vessel or lesion type -     CT heart angiogram; Future  SOB (shortness of breath) on exertion  Hypercholesteremia  Primary hypertension  Syncope and collapse  Aortic dilatation ()    I spent a total of  in both face-to-face and non-face-to-face activities, excluding procedures performed, for this visit on the date of this encounter.             Future Appointments     Date/Time Provider Department Center Visit Type   12/01/2023 3:00 PM PASS RN NEXT DAY ONLY [facility] PASS NURSE PHONE CALL   12/04/2023 9:30 AM (Arrive by 9:15 AM) Chet Mitzie BROCKS, OT [facility] [place] [facility]OT   12/18/2023 11:00 AM (Arrive by 10:45 AM) Annabella Deitra Ruth, PA [facility] [place] [facility]   12/18/2023 12:00 PM (Arrive by 11:45 AM) Ana Laurita Fulling, MD [facility]   01/17/2024 10:00 AM Lane Arthea Locus, MD [facility] RETURN VISIT   01/19/2024 11:00 AM (Arrive by 10:45 AM) Charlie Oliva Pac, MD [facility] [place] [facility]   01/19/2024 11:30 AM (Arrive by 11:15 AM) Charlie Oliva Pac, MD [facility] [place] [facility]   02/05/2024 9:30 AM (Arrive by 9:15 AM) Cheril Lukes, PA [facility] RETURN VISIT   03/19/2024 10:00 AM Ammon Blunt, MD [facility] FOLLOW UP   05/13/2024 10:15 AM (Arrive by 10:00 AM) Sonnie Blunt Standing, MD [facility] RETURN VISIT   07/26/2024 10:00 AM (Arrive by 9:30 AM) Lenon Toribio Motto, MD [facility] RETURN VISIT       There are no Patient Instructions on file for this visit.   An after visit summary was provided for the patient either in written format (printed) or through  MyChart.  This note has been created using automated tools and reviewed for accuracy by [facility]   Attestation Statement:   I personally performed the service, non-incident to. (WP)   ANNA MARIA DRANE, PA-C

## 2023-12-01 ENCOUNTER — Other Ambulatory Visit: Payer: Self-pay | Admitting: Physician Assistant

## 2023-12-01 ENCOUNTER — Other Ambulatory Visit

## 2023-12-01 DIAGNOSIS — E785 Hyperlipidemia, unspecified: Secondary | ICD-10-CM

## 2023-12-01 DIAGNOSIS — R55 Syncope and collapse: Secondary | ICD-10-CM

## 2023-12-01 DIAGNOSIS — I25119 Atherosclerotic heart disease of native coronary artery with unspecified angina pectoris: Secondary | ICD-10-CM

## 2023-12-01 DIAGNOSIS — Z8249 Family history of ischemic heart disease and other diseases of the circulatory system: Secondary | ICD-10-CM

## 2023-12-01 DIAGNOSIS — I10 Essential (primary) hypertension: Secondary | ICD-10-CM

## 2023-12-01 NOTE — Telephone Encounter (Signed)
 Copied from CRM [id number]. Topic: General - Other >> Dec 01, 2023 10:09 AM Zebedee SAUNDERS wrote: Reason for CRM: Pt needs someone to call he to discuss labs that Dr. Glendia needs. Pt can't make today's appt due to transportation and surgery. Please call pt at [phone]

## 2023-12-03 ENCOUNTER — Encounter: Payer: Self-pay | Admitting: Internal Medicine

## 2023-12-03 DIAGNOSIS — S6990XA Unspecified injury of unspecified wrist, hand and finger(s), initial encounter: Secondary | ICD-10-CM | POA: Insufficient documentation

## 2023-12-03 NOTE — Assessment & Plan Note (Signed)
 Remote history of right basal ganglia infarct. Seeing neurology. Continue risk factor modifcation. Blood pressure doing well. Continue crestor . Continue daily aspirin .

## 2023-12-03 NOTE — Assessment & Plan Note (Signed)
 Is s/p right wrist surgery 10/10/23. Being followed by orthopedic surgery.

## 2023-12-03 NOTE — Assessment & Plan Note (Signed)
 Concern regarding recent change as outlined. Discussed the need to follow up with neurology. Discussed medications - to discuss with neurology. No focal deficit noted on exam today. Check labs as outlined including B12.

## 2023-12-03 NOTE — Assessment & Plan Note (Addendum)
 Low cholesterol diet and exercise. Continue crestor . Follow lipid panel.

## 2023-12-04 ENCOUNTER — Telehealth: Payer: Self-pay

## 2023-12-04 DIAGNOSIS — T8484XA Pain due to internal orthopedic prosthetic devices, implants and grafts, initial encounter: Secondary | ICD-10-CM | POA: Diagnosis not present

## 2023-12-04 NOTE — Telephone Encounter (Signed)
 Copied from CRM #[id number]. Topic: General - Other >> Dec 04, 2023  3:57 PM Viola F wrote: Reason for CRM: Patient had surgery done today (RIGHT REMOVAL OF IMPLANT; WRIST/HAND/FINGER, DEEP (EG, BURIED WIRE, PIN, SCREW, METAL BAND, NAIL, ROD OR PLATE) - Right FLUOROSCOPY - Right  ) he wants to let Dr. Glendia know that everything went well and he is now at home resting

## 2023-12-05 NOTE — Telephone Encounter (Signed)
 I had called and left message. Please call and thank him for the update. Keep us  posted and let us  know if he needs anything.

## 2023-12-12 ENCOUNTER — Other Ambulatory Visit

## 2023-12-12 DIAGNOSIS — E7800 Pure hypercholesterolemia, unspecified: Secondary | ICD-10-CM | POA: Diagnosis not present

## 2023-12-12 LAB — HEPATIC FUNCTION PANEL
ALT: 47 U/L (ref 0–53)
AST: 22 U/L (ref 0–37)
Albumin: 4.3 g/dL (ref 3.5–5.2)
Alkaline Phosphatase: 152 U/L — ABNORMAL HIGH (ref 39–117)
Bilirubin, Direct: 0.1 mg/dL (ref 0.0–0.3)
Total Bilirubin: 0.4 mg/dL (ref 0.2–1.2)
Total Protein: 8 g/dL (ref 6.0–8.3)

## 2023-12-13 ENCOUNTER — Ambulatory Visit: Payer: Self-pay | Admitting: Internal Medicine

## 2023-12-14 NOTE — Progress Notes (Unsigned)
 Cardiology Office Note   Date:  12/15/2023  ID:  [name], DOB Mar 11, 1960, MRN [id number] PCP: Glendia Shad, MD  [facility] Providers Cardiologist:  Caron Poser, MD     History of Present Illness [name] is a 63 y.o. male PMH HTN, HLD, rheumatoid arthritis who presents for further evaluation management of syncope.  Patient seen in the ED for this issue 11/28/2023.  He had multiple syncopal episodes throughout that day.  No apparent prodromal symptoms.  Basic evaluation revealed negative troponins, but an elevated D-dimer.  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  Last LDL 71 11/2023.  Patient notes he has not had any further syncopal episodes since discharge from the ED.  He does wear an Apple Watch and notes a persistent sinus tachycardia.  He saw the [facility] group and has a coronary CT scheduled for next week.  He notes that he has had numerous family members that have had large MIs at his exact age.  He denies any chest discomfort.  Does have intermittent palpitations but no more syncope.  Denies any heart failure signs or symptoms.  Relevant CVD History -Coronary CT angiogram pending (ordered by [facility] group) -CTPA 11/2023 mild CAC -TTE 03/2023 normal biventricular function, grade 1 diastolic dysfunction, no significant valve disease -Normal SPECT 03/2023 - TTE 09/2019 normal biventricular function, no significant valvular disease - Essentially normal carotid Dopplers 09/2019   ROS: Pt denies any chest discomfort, jaw pain, arm pain, orthopnea, PND, or LE edema.  Studies Reviewed I have independently reviewed the patient's ECG, previous cardiac testing, recent medical records, recent blood work.  Physical Exam VS:  BP 122/78 (BP Location: Left Arm, Patient Position: Sitting, Cuff Size: Normal)   Pulse (!) 112   Ht 6' 1 (1.854 m)   Wt 212 lb (96.2 kg)   SpO2 98%   BMI 27.97 kg/m   Orthostatic VS for the past 24 hrs  (Last 3 readings):  BP- Lying Pulse- Lying BP- Sitting Pulse- Sitting BP- Standing at 0 minutes Pulse- Standing at 0 minutes BP- Standing at 3 minutes Pulse- Standing at 3 minutes  12/15/23 1045 128/75 121 131/78 121 130/80 120 132/81 123      Wt Readings from Last 3 Encounters:  12/15/23 212 lb (96.2 kg)  11/28/23 213 lb 13.5 oz (97 kg)  11/27/23 215 lb (97.5 kg)    GEN: No acute distress. NECK: No JVD; No carotid bruits. CARDIAC: Tachycardic, no murmurs, rubs, gallops. RESPIRATORY:  Clear to auscultation. EXTREMITIES:  Warm and well-perfused. No edema.  ASSESSMENT AND PLAN Syncope and collapse Patient presents for further evaluation of syncope.  He had numerous episodes on the day of presentation to the ED 11/28/2023.  He has fortunately not had recurrence since.  He is persistently tachycardic.  Workup in the ED did not show any clear driving etiology of persistent sinus tachycardia.  He saw the [facility] group and has a coronary CT ordered.  Orthostatic vital signs in office today did not show any hypotension.  His heart rate was tachycardic but essentially remained unchanged with positional changes.  Plan: - Agree with coronary CT given strong family history of multiple Mis - Repeat echocardiogram to rule out structural causes - Monitor to evaluate for arrhythmia - Check TSH given persistent sinus tachycardia - In the meantime I suggested that he discontinue taking his nortriptyline as this is a notorious medication for causing falls - He  is on Strattera  which can cause tachycardia.  Will not make any further medication changes for now until we have ruled out significant organic cardiovascular disease as a cause.  TAA 4.1 cm measured on recent CTPA.  Will plan for annual surveillance for now.  CAC HLD Mild CAC seen on recent CTPA.  Strong family history of ASCVD.  Last LDL was 71 11/2023.  Plan: - Continue ASA 81 mg daily - Continue Crestor  5 mg daily        Dispo: RTC 2 months  after cardiac testing  Signed, Caron Poser, MD

## 2023-12-15 ENCOUNTER — Ambulatory Visit (INDEPENDENT_AMBULATORY_CARE_PROVIDER_SITE_OTHER)

## 2023-12-15 ENCOUNTER — Ambulatory Visit

## 2023-12-15 VITALS — BP 122/78 | HR 112 | Ht 73.0 in | Wt 212.0 lb

## 2023-12-15 DIAGNOSIS — R55 Syncope and collapse: Secondary | ICD-10-CM

## 2023-12-15 DIAGNOSIS — Z79899 Other long term (current) drug therapy: Secondary | ICD-10-CM | POA: Diagnosis not present

## 2023-12-15 DIAGNOSIS — I7121 Aneurysm of the ascending aorta, without rupture: Secondary | ICD-10-CM | POA: Diagnosis not present

## 2023-12-15 DIAGNOSIS — E782 Mixed hyperlipidemia: Secondary | ICD-10-CM | POA: Diagnosis not present

## 2023-12-15 DIAGNOSIS — I2510 Atherosclerotic heart disease of native coronary artery without angina pectoris: Secondary | ICD-10-CM | POA: Diagnosis not present

## 2023-12-15 MED ORDER — ASPIRIN 81 MG PO TBEC
81.0000 mg | DELAYED_RELEASE_TABLET | Freq: Every day | ORAL | Status: AC
Start: 2023-12-15 — End: ?

## 2023-12-15 NOTE — Patient Instructions (Addendum)
 Your physician recommends the following medication changes.  STOP TAKING: Nortriptyline (PAMELOR) 200 mg tablet  Added Aspirin  81 mg to your medication list, and continue all other medications as prescribed.  *If you need a refill on your cardiac medications before your next appointment, please call your pharmacy*  Lab Work:  Your provider would like for you to have following labs drawn today TSH .   If you have labs (blood work) drawn today and your tests are completely normal, you will receive your results only by: MyChart Message (if you have MyChart) OR A paper copy in the mail If you have any lab test that is abnormal or we need to change your treatment, we will call you to review the results.  Testing/Procedures:   Your physician has requested that you have an echocardiogram. Echocardiography is a painless test that uses sound waves to create images of your heart. It provides your doctor with information about the size and shape of your heart and how well your heart's chambers and valves are working.   You may receive an ultrasound enhancing agent through an IV if needed to better visualize your heart during the echo. This procedure takes approximately one hour.  There are no restrictions for this procedure.  This will take place at [address] [facility]) [address], [place] [address]  Please note: We ask at that you not bring children with you during ultrasound (echo/ vascular) testing. Due to room size and safety concerns, children are not allowed in the ultrasound rooms during exams. Our front office staff cannot provide observation of children in our lobby area while testing is being conducted. An adult accompanying a patient to their appointment will only be allowed in the ultrasound room at the discretion of the ultrasound technician under special circumstances. We apologize for any inconvenience.    ZIO XT- Long Term Monitor Instructions  Your physician  has requested you wear a ZIO patch monitor for 14 days.  This is a single patch monitor. [employer] supplies one patch monitor per enrollment. Additional stickers are not available. Please do not apply patch if you will be having a Nuclear Stress Test, Echocardiogram, Cardiac CT, MRI, or Chest Xray during the period you would be wearing the monitor. The patch cannot be worn during these tests. You cannot remove and re-apply the ZIO XT patch monitor.  Your ZIO patch monitor will be mailed 3 day USPS to your address on file. It may take 3-5 days to receive your monitor after you have been enrolled. Once you have received your monitor, please review the enclosed instructions. Your monitor has already been registered assigning a specific monitor serial number to you.  Billing and Patient Assistance Program Information  We have supplied [employer] with any of your insurance information on file for billing purposes.  [employer] offers a sliding scale Patient Assistance Program for patients that do not have insurance, or whose insurance does not completely cover the cost of the ZIO monitor.  You must apply for the Patient Assistance Program to qualify for this discounted rate.  To apply, please call [employer] at [phone], select option 4, select option 2, ask to apply for Patient Assistance Program. Meredeth will ask your household income, and how many people are in your household. They will quote your out-of-pocket cost based on that information. [employer] will also be able to set up a 19-month, interest-free payment plan if needed.  Applying the monitor   Shave hair from upper left chest.  Hold abrader disc by orange tab. Rub abrader in 40 strokes over the upper left chest as indicated in your monitor instructions.  Clean area with 4 enclosed alcohol pads. Let dry.  Apply patch as indicated in monitor instructions. Patch will be placed under collarbone on left side of chest with arrow pointing upward.  Rub patch  adhesive wings for 2 minutes. Remove white label marked 1. Remove the white label marked 2. Rub patch adhesive wings for 2 additional minutes.  While looking in a mirror, press and release button in center of patch. A small green light will flash 3-4 times. This will be your only indicator that the monitor has been turned on.   After Applying Monitor: Do not shower for the first 24 hours. You may shower after the first 24 hours.  Press the button if you feel a symptom. You will hear a small click. Record Date, Time and Symptom in the Patient Logbook.   After Completing 14 Days: When you are ready to remove the patch, follow instructions on the last 2 pages of Patient Logbook.  Stick patch monitor into the tabs at the bottom of the return box.  Place Patient Logbook in the blue and white box. Use locking tab on box and tape box closed securely. The blue and white box has prepaid postage on it. Please place it in the mailbox as soon as possible. Your physician should have your test results approximately 7-14 days after the monitor has been mailed back to [facility].   Troubleshooting: Call [facility] at [phone] if you have questions regarding your ZIO XT patch monitor.  Call them immediately if you see an orange light blinking on your monitor.  If your monitor falls off in less than 4 days, contact our Monitor department at [phone].  If your monitor becomes loose or falls off after 4 days call [employer] at [phone] for suggestions on securing your monitor.   Follow-Up: At [facility], you and your health needs are our priority.  As part of our continuing mission to provide you with exceptional heart care, our providers are all part of one team.  This team includes your primary Cardiologist (physician) and Advanced Practice Providers or APPs (Physician Assistants and Nurse Practitioners) who all work together to provide you with the care you need,  when you need it.  Your next appointment:   2 month(s)  Provider:   You may see Caron Poser, MD or one of the following Advanced Practice Providers on your designated Care Team:   Lonni Meager, NP Lesley Maffucci, PA-C Bernardino Bring, PA-C Cadence [place], PA-C Tylene Lunch, NP Barnie Hila, NP    We recommend signing up for the patient portal called MyChart.  Sign up information is provided on this After Visit Summary.  MyChart is used to connect with patients for Virtual Visits (Telemedicine).  Patients are able to view lab/test results, encounter notes, upcoming appointments, etc.  Non-urgent messages can be sent to your provider as well.   To learn more about what you can do with MyChart, go to [email or web address].

## 2023-12-16 ENCOUNTER — Ambulatory Visit: Payer: Self-pay

## 2023-12-16 LAB — TSH: TSH: 1.64 u[IU]/mL (ref 0.450–4.500)

## 2023-12-20 ENCOUNTER — Other Ambulatory Visit: Payer: Self-pay

## 2023-12-20 ENCOUNTER — Telehealth (HOSPITAL_COMMUNITY): Payer: Self-pay | Admitting: *Deleted

## 2023-12-20 MED ORDER — METOPROLOL SUCCINATE ER 100 MG PO TB24
ORAL_TABLET | ORAL | 0 refills | Status: AC
  Filled 2023-12-20: qty 1, 1d supply, fill #0

## 2023-12-20 MED ORDER — IVABRADINE HCL 5 MG PO TABS
15.0000 mg | ORAL_TABLET | Freq: Once | ORAL | 0 refills | Status: AC
  Filled 2023-12-20: qty 3, 1d supply, fill #0

## 2023-12-20 NOTE — Telephone Encounter (Signed)
 Reaching out to patient to offer assistance regarding upcoming cardiac imaging study; pt verbalizes understanding of appt date/time, parking situation and where to check in, pre-test NPO status and medications ordered, and verified current allergies; name and call back number provided for further questions should they arise Sid Seats RN Navigator Cardiac Imaging [facility] [phone] office [phone] cell

## 2023-12-21 ENCOUNTER — Ambulatory Visit
Admission: RE | Admit: 2023-12-21 | Discharge: 2023-12-21 | Disposition: A | Discharge status: Home / Self Care | Source: Ambulatory Visit | Attending: Physician Assistant | Admitting: Physician Assistant

## 2023-12-21 DIAGNOSIS — I10 Essential (primary) hypertension: Secondary | ICD-10-CM | POA: Insufficient documentation

## 2023-12-21 DIAGNOSIS — R55 Syncope and collapse: Secondary | ICD-10-CM | POA: Insufficient documentation

## 2023-12-21 DIAGNOSIS — E785 Hyperlipidemia, unspecified: Secondary | ICD-10-CM | POA: Insufficient documentation

## 2023-12-21 DIAGNOSIS — Z8249 Family history of ischemic heart disease and other diseases of the circulatory system: Secondary | ICD-10-CM | POA: Diagnosis present

## 2023-12-21 DIAGNOSIS — I25119 Atherosclerotic heart disease of native coronary artery with unspecified angina pectoris: Secondary | ICD-10-CM | POA: Insufficient documentation

## 2023-12-21 MED ORDER — ONDANSETRON HCL 4 MG/2ML IJ SOLN
INTRAMUSCULAR | Status: AC
  Filled 2023-12-21: qty 2

## 2023-12-21 MED ORDER — IOHEXOL 350 MG/ML SOLN
100.0000 mL | Freq: Once | INTRAVENOUS | Status: AC | PRN
  Administered 2023-12-21: 100 mL via INTRAVENOUS

## 2023-12-21 MED ORDER — METOPROLOL TARTRATE 5 MG/5ML IV SOLN
10.0000 mg | Freq: Once | INTRAVENOUS | Status: DC | PRN
  Filled 2023-12-21: qty 10

## 2023-12-21 MED ORDER — NITROGLYCERIN 0.4 MG SL SUBL
0.8000 mg | SUBLINGUAL_TABLET | Freq: Once | SUBLINGUAL | Status: AC
  Administered 2023-12-21: 0.8 mg via SUBLINGUAL
  Filled 2023-12-21: qty 25

## 2023-12-21 MED ORDER — ONDANSETRON HCL 4 MG/2ML IJ SOLN
4.0000 mg | Freq: Once | INTRAMUSCULAR | Status: AC
  Administered 2023-12-21: 4 mg via INTRAVENOUS
  Filled 2023-12-21: qty 2

## 2023-12-21 MED ORDER — DILTIAZEM HCL 25 MG/5ML IV SOLN
10.0000 mg | INTRAVENOUS | Status: DC | PRN
  Filled 2023-12-21: qty 5

## 2023-12-21 NOTE — Progress Notes (Signed)
 Patient arrived for Cardiact CT stated has nausea and has a history of nausea. Administered Zofran  4mg  IV Patient tolerated CT well. Denies nausea. Vital signs stable encourage to drink water throughout day.Reasons explained and verbalized understanding. Ambulated steady gait.

## 2024-01-09 DIAGNOSIS — Q245 Malformation of coronary vessels: Secondary | ICD-10-CM | POA: Diagnosis not present

## 2024-01-09 DIAGNOSIS — I10 Essential (primary) hypertension: Secondary | ICD-10-CM | POA: Diagnosis not present

## 2024-01-09 DIAGNOSIS — R0602 Shortness of breath: Secondary | ICD-10-CM | POA: Diagnosis not present

## 2024-01-09 DIAGNOSIS — I25119 Atherosclerotic heart disease of native coronary artery with unspecified angina pectoris: Secondary | ICD-10-CM | POA: Diagnosis not present

## 2024-01-09 DIAGNOSIS — I77819 Aortic ectasia, unspecified site: Secondary | ICD-10-CM | POA: Diagnosis not present

## 2024-01-09 DIAGNOSIS — R55 Syncope and collapse: Secondary | ICD-10-CM | POA: Diagnosis not present

## 2024-01-09 DIAGNOSIS — R0789 Other chest pain: Secondary | ICD-10-CM | POA: Diagnosis not present

## 2024-01-09 DIAGNOSIS — E7800 Pure hypercholesterolemia, unspecified: Secondary | ICD-10-CM | POA: Diagnosis not present

## 2024-01-10 DIAGNOSIS — R55 Syncope and collapse: Secondary | ICD-10-CM | POA: Diagnosis not present

## 2024-01-11 DIAGNOSIS — I7121 Aneurysm of the ascending aorta, without rupture: Secondary | ICD-10-CM | POA: Diagnosis not present

## 2024-01-11 DIAGNOSIS — R55 Syncope and collapse: Secondary | ICD-10-CM

## 2024-01-17 ENCOUNTER — Ambulatory Visit

## 2024-01-17 DIAGNOSIS — R202 Paresthesia of skin: Secondary | ICD-10-CM | POA: Diagnosis not present

## 2024-01-17 DIAGNOSIS — G8929 Other chronic pain: Secondary | ICD-10-CM | POA: Diagnosis not present

## 2024-01-17 DIAGNOSIS — R4189 Other symptoms and signs involving cognitive functions and awareness: Secondary | ICD-10-CM | POA: Diagnosis not present

## 2024-01-17 DIAGNOSIS — H53149 Visual discomfort, unspecified: Secondary | ICD-10-CM | POA: Diagnosis not present

## 2024-01-17 DIAGNOSIS — R519 Headache, unspecified: Secondary | ICD-10-CM | POA: Diagnosis not present

## 2024-01-17 DIAGNOSIS — M545 Low back pain, unspecified: Secondary | ICD-10-CM | POA: Diagnosis not present

## 2024-02-08 ENCOUNTER — Ambulatory Visit

## 2024-02-08 DIAGNOSIS — R55 Syncope and collapse: Secondary | ICD-10-CM | POA: Diagnosis not present

## 2024-02-08 DIAGNOSIS — I7121 Aneurysm of the ascending aorta, without rupture: Secondary | ICD-10-CM

## 2024-02-08 LAB — ECHOCARDIOGRAM COMPLETE
AR max vel: 3.1 cm2
AV Area VTI: 3.39 cm2
AV Area mean vel: 3.08 cm2
AV Mean grad: 3 mmHg
AV Peak grad: 6.1 mmHg
Ao pk vel: 1.23 m/s
Area-P 1/2: 3.74 cm2
Calc EF: 54.3 %
S' Lateral: 4.1 cm
Single Plane A2C EF: 52.5 %
Single Plane A4C EF: 55.7 %

## 2024-02-09 NOTE — Telephone Encounter (Signed)
 The following is from Dr. [place].  The echocardiogram is not a very accurate measure of this. You had 2 CT scans, one 11/2023 and one 12/2023 which both showed stable 4.1cm. The CT scans are the most accurate way to assess this. At this point, it is only mildly dilated and can be observed on an annual basis.   Please let me know if there is anything else that I can assist you with.

## 2024-02-13 NOTE — Progress Notes (Unsigned)
 " Cardiology Office Note   Date:  02/15/2024  ID:  TAHJIR SILVERIA, DOB 11/08/1960, MRN [id number] PCP: Glendia Shad, MD   HeartCare Providers Cardiologist:  Caron Poser, MD     History of Present Illness [name] is a 64 y.o. male PMH HTN, HLD, rheumatoid arthritis who presents for further evaluation management of syncope.  Patient seen in the ED for this issue 11/28/2023.  He had multiple syncopal episodes throughout that day.  No apparent prodromal symptoms.  Basic evaluation revealed negative troponins, but an elevated D-dimer.  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  Last LDL 71 11/2023.  Patient notes he has not had any further syncopal episodes since discharge from the ED.  He does wear an Apple Watch and notes a persistent sinus tachycardia.  He saw the [facility] and has a coronary CT scheduled for next week.  He notes that he has had numerous family members that have had large MIs at his exact age.  He denies any chest discomfort.  Does have intermittent palpitations but no more syncope.  Denies any heart failure signs or symptoms.  Interval History: Since last visit, a CT showed only minimal disease. Monitor showed a few brief salvos of SVT. TTE unremarkable. 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  Still having some dizziness.  Relevant CVD History -TTE 02/2024 normal biventricular function, grade 1 diastolic dysfunction, mild MR, ascending aorta 43 mm -Zio 01/2024 mean HR 85bpm, rare ectopy, 5 brief salvos of SVT (longest 11 beats, max HR 179bpm). Triggers corresponded mostly to sinus with occasional correlations to ectopy. 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  Studies Reviewed I have independently reviewed the patient's ECG, previous cardiac testing, recent medical records, recent blood work.  Physical Exam VS:  BP 132/72 (BP Location: Left Arm, Patient Position: Sitting, Cuff Size: Large)   Pulse 79   Ht 6' 1 (1.854 m)   Wt 220 lb (99.8 kg)   SpO2 99%   BMI 29.03 kg/m   Orthostatic VS for the past 24 hrs (Last 3 readings):  BP- Lying Pulse- Lying BP- Sitting Pulse- Sitting BP- Standing at 0 minutes Pulse- Standing at 0 minutes BP- Standing at 3 minutes Pulse- Standing at 3 minutes  02/15/24 1007 139/75 79 126/75 80 145/89 82 145/90 87       Wt Readings from Last 3 Encounters:  02/15/24 220 lb (99.8 kg)  12/15/23 212 lb (96.2 kg)  11/28/23 213 lb 13.5 oz (97 kg)    GEN: No acute distress. NECK: No JVD; No carotid bruits. CARDIAC: Tachycardic, no murmurs, rubs, gallops. RESPIRATORY:  Clear to auscultation. EXTREMITIES:  Warm and well-perfused. No edema.  ASSESSMENT AND PLAN Syncope and collapse Patient presents for further evaluation of syncope.  He had numerous episodes on the day of presentation to the ED 11/28/2023.  He has fortunately not had recurrence since.  Orthostatic vital signs are negative.  Coronary CTA CAD RADS 1.  Monitor low risk with only a few brief episodes of SVT.  Echo essentially normal.  Reassuring cardiac testing thus far.  Unclear etiology, possibly polypharmacy versus other noncardiac cause.  Plan: - No further cardiac testing/treatment indicated - He has several medications on his list which could certainly cause issues; I recommended he discuss discontinuation of some of these if possible with his regular provider (tizanidine , lyrica, trazodone , etc.)  TAA 4.1 cm measured on CTPA 11/2023 and again on CCTA 12/2023.  When indexed to body size, this actually measures within  the normal range.  Will plan for annual surveillance for now.  Nonobstructive CAD HLD Coronary CTA CAD RADS 1 12/2023.  Last LDL was 71 11/2023.  Plan: - Continue ASA 81 mg daily - Continue Crestor  5 mg daily        Dispo: RTC 1 year with repeat CT scan to monitor aortic dilation  Signed, Caron Poser, MD  "

## 2024-02-14 ENCOUNTER — Ambulatory Visit

## 2024-02-15 ENCOUNTER — Ambulatory Visit

## 2024-02-15 VITALS — BP 132/72 | HR 79 | Ht 73.0 in | Wt 220.0 lb

## 2024-02-15 DIAGNOSIS — I7121 Aneurysm of the ascending aorta, without rupture: Secondary | ICD-10-CM | POA: Diagnosis not present

## 2024-02-15 DIAGNOSIS — E782 Mixed hyperlipidemia: Secondary | ICD-10-CM | POA: Diagnosis not present

## 2024-02-15 DIAGNOSIS — I2510 Atherosclerotic heart disease of native coronary artery without angina pectoris: Secondary | ICD-10-CM

## 2024-02-15 DIAGNOSIS — R55 Syncope and collapse: Secondary | ICD-10-CM

## 2024-02-15 NOTE — Patient Instructions (Signed)
 Medication Instructions:  Your physician recommends the following medication changes.  Please work with your PRIMARY CARE PHYSICIAN to stop the following medications that may be contributing to your dizziness and not necessary since your cardiac testing is essentially normal:  Tizanidine  Lyrica Trazadone   Continue all prescribed medications until seeing your PCP. *If you need a refill on your cardiac medications before your next appointment, please call your pharmacy*  Lab Work: No labs ordered today  If you have labs (blood work) drawn today and your tests are completely normal, you will receive your results only by: MyChart Message (if you have MyChart) OR A paper copy in the mail If you have any lab test that is abnormal or we need to change your treatment, we will call you to review the results.  Testing/Procedures:  CTA of Chest  coordinated with your 1 year follow up to monitor your aortic dilation/aneurysm.  CT Angiography (CTA) chest/aorta, is a special type of CT scan that uses a computer to produce multi-dimensional views of major blood vessels throughout the body. In CT angiography, a contrast material is injected through an IV to help visualize the blood vessels  Nothing to eat or drink 4 hours prior to test  [facility] [address]  [place], [place] [address]   Follow-Up: At [facility], you and your health needs are our priority.  As part of our continuing mission to provide you with exceptional heart care, our providers are all part of one team.  This team includes your primary Cardiologist (physician) and Advanced Practice Providers or APPs (Physician Assistants and Nurse Practitioners) who all work together to provide you with the care you need, when you need it.  Your next appointment:   12 month(s)  Provider:   You may see Eddie Poser, MD or one of the following Advanced Practice Providers on your  designated Care Team:   Lonni Meager, NP Lesley Maffucci, PA-C Bernardino Bring, PA-C Cadence [place], PA-C Tylene Lunch, NP Barnie Hila, NP   We recommend signing up for the patient portal called MyChart.  Sign up information is provided on this After Visit Summary.  MyChart is used to connect with patients for Virtual Visits (Telemedicine).  Patients are able to view lab/test results, encounter notes, upcoming appointments, etc.  Non-urgent messages can be sent to your provider as well.   To learn more about what you can do with MyChart, go to [email or web address].

## 2024-02-29 ENCOUNTER — Other Ambulatory Visit: Payer: Self-pay | Admitting: Internal Medicine

## 2024-04-02 ENCOUNTER — Ambulatory Visit: Admitting: Internal Medicine
